# Patient Record
Sex: Female | Born: 1980 | Race: Black or African American | Hispanic: No | Marital: Single | Smoking: Never smoker
Health system: Southern US, Community
[De-identification: ages and names within clinical notes are randomized; demographics above are authoritative.]

## PROBLEM LIST (undated history)

## (undated) DIAGNOSIS — D62 Acute posthemorrhagic anemia: Secondary | ICD-10-CM

## (undated) DIAGNOSIS — F4320 Adjustment disorder, unspecified: Secondary | ICD-10-CM

## (undated) DIAGNOSIS — R7881 Bacteremia: Secondary | ICD-10-CM

## (undated) DIAGNOSIS — N289 Disorder of kidney and ureter, unspecified: Secondary | ICD-10-CM

## (undated) DIAGNOSIS — G8250 Quadriplegia, unspecified: Secondary | ICD-10-CM

## (undated) DIAGNOSIS — K259 Gastric ulcer, unspecified as acute or chronic, without hemorrhage or perforation: Secondary | ICD-10-CM

## (undated) DIAGNOSIS — K269 Duodenal ulcer, unspecified as acute or chronic, without hemorrhage or perforation: Secondary | ICD-10-CM

## (undated) DIAGNOSIS — N12 Tubulo-interstitial nephritis, not specified as acute or chronic: Secondary | ICD-10-CM

## (undated) DIAGNOSIS — L139 Bullous disorder, unspecified: Secondary | ICD-10-CM

## (undated) DIAGNOSIS — N949 Unspecified condition associated with female genital organs and menstrual cycle: Secondary | ICD-10-CM

## (undated) DIAGNOSIS — A419 Sepsis, unspecified organism: Secondary | ICD-10-CM

## (undated) HISTORY — DX: Unspecified condition associated with female genital organs and menstrual cycle: N94.9

## (undated) HISTORY — DX: Tubulo-interstitial nephritis, not specified as acute or chronic: N12

---
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
## 2000-02-03 ENCOUNTER — Emergency Department (HOSPITAL_COMMUNITY): Admission: EM | Admit: 2000-02-03 | Discharge: 2000-02-03 | Payer: Self-pay | Admitting: Emergency Medicine

## 2000-02-04 ENCOUNTER — Encounter: Payer: Self-pay | Admitting: Emergency Medicine

## 2000-05-17 ENCOUNTER — Encounter: Admission: RE | Admit: 2000-05-17 | Discharge: 2000-05-17 | Payer: Self-pay | Admitting: Family Medicine

## 2000-05-26 ENCOUNTER — Encounter: Admission: RE | Admit: 2000-05-26 | Discharge: 2000-05-26 | Payer: Self-pay | Admitting: Family Medicine

## 2000-06-01 ENCOUNTER — Encounter: Admission: RE | Admit: 2000-06-01 | Discharge: 2000-06-01 | Payer: Self-pay | Admitting: Family Medicine

## 2001-05-23 ENCOUNTER — Inpatient Hospital Stay (HOSPITAL_COMMUNITY): Admission: EM | Admit: 2001-05-23 | Discharge: 2001-05-26 | Payer: Self-pay | Admitting: Emergency Medicine

## 2001-05-23 ENCOUNTER — Encounter: Payer: Self-pay | Admitting: *Deleted

## 2001-05-23 ENCOUNTER — Encounter: Payer: Self-pay | Admitting: Emergency Medicine

## 2001-05-24 ENCOUNTER — Encounter: Payer: Self-pay | Admitting: Family Medicine

## 2001-05-25 ENCOUNTER — Encounter: Payer: Self-pay | Admitting: Obstetrics

## 2001-06-07 ENCOUNTER — Inpatient Hospital Stay (HOSPITAL_COMMUNITY): Admission: AD | Admit: 2001-06-07 | Discharge: 2001-06-09 | Payer: Self-pay | Admitting: *Deleted

## 2001-06-07 ENCOUNTER — Encounter (INDEPENDENT_AMBULATORY_CARE_PROVIDER_SITE_OTHER): Payer: Self-pay

## 2001-06-13 ENCOUNTER — Encounter: Admission: RE | Admit: 2001-06-13 | Discharge: 2001-06-13 | Payer: Self-pay | Admitting: Family Medicine

## 2001-08-29 ENCOUNTER — Encounter: Admission: RE | Admit: 2001-08-29 | Discharge: 2001-08-29 | Payer: Self-pay | Admitting: Family Medicine

## 2002-03-09 ENCOUNTER — Encounter: Admission: RE | Admit: 2002-03-09 | Discharge: 2002-03-09 | Payer: Self-pay | Admitting: Family Medicine

## 2002-05-28 ENCOUNTER — Emergency Department (HOSPITAL_COMMUNITY): Admission: EM | Admit: 2002-05-28 | Discharge: 2002-05-28 | Payer: Self-pay | Admitting: Emergency Medicine

## 2002-10-30 ENCOUNTER — Encounter: Admission: RE | Admit: 2002-10-30 | Discharge: 2002-10-30 | Payer: Self-pay | Admitting: Family Medicine

## 2003-01-29 ENCOUNTER — Encounter: Admission: RE | Admit: 2003-01-29 | Discharge: 2003-01-29 | Payer: Self-pay | Admitting: Sports Medicine

## 2003-04-19 ENCOUNTER — Emergency Department (HOSPITAL_COMMUNITY): Admission: EM | Admit: 2003-04-19 | Discharge: 2003-04-20 | Payer: Self-pay | Admitting: Emergency Medicine

## 2004-02-05 ENCOUNTER — Ambulatory Visit: Payer: Self-pay | Admitting: *Deleted

## 2004-02-05 ENCOUNTER — Inpatient Hospital Stay (HOSPITAL_COMMUNITY): Admission: AD | Admit: 2004-02-05 | Discharge: 2004-02-06 | Payer: Self-pay | Admitting: *Deleted

## 2004-03-02 ENCOUNTER — Inpatient Hospital Stay (HOSPITAL_COMMUNITY): Admission: AD | Admit: 2004-03-02 | Discharge: 2004-03-03 | Payer: Self-pay | Admitting: Family Medicine

## 2004-04-24 ENCOUNTER — Inpatient Hospital Stay (HOSPITAL_COMMUNITY): Admission: AD | Admit: 2004-04-24 | Discharge: 2004-04-26 | Payer: Self-pay | Admitting: *Deleted

## 2004-04-24 ENCOUNTER — Ambulatory Visit: Payer: Self-pay | Admitting: Family Medicine

## 2004-04-24 ENCOUNTER — Ambulatory Visit: Payer: Self-pay | Admitting: *Deleted

## 2004-04-24 ENCOUNTER — Encounter (INDEPENDENT_AMBULATORY_CARE_PROVIDER_SITE_OTHER): Payer: Self-pay | Admitting: *Deleted

## 2004-04-25 ENCOUNTER — Encounter (INDEPENDENT_AMBULATORY_CARE_PROVIDER_SITE_OTHER): Payer: Self-pay | Admitting: *Deleted

## 2004-04-25 HISTORY — PX: TUBAL LIGATION: SHX77

## 2004-10-28 ENCOUNTER — Emergency Department (HOSPITAL_COMMUNITY): Admission: EM | Admit: 2004-10-28 | Discharge: 2004-10-28 | Payer: Self-pay | Admitting: Emergency Medicine

## 2004-11-24 ENCOUNTER — Inpatient Hospital Stay (HOSPITAL_COMMUNITY): Admission: AD | Admit: 2004-11-24 | Discharge: 2004-11-24 | Payer: Self-pay | Admitting: Obstetrics & Gynecology

## 2005-10-21 ENCOUNTER — Encounter: Payer: Self-pay | Admitting: Obstetrics

## 2006-01-24 ENCOUNTER — Inpatient Hospital Stay (HOSPITAL_COMMUNITY): Admission: AD | Admit: 2006-01-24 | Discharge: 2006-01-24 | Payer: Self-pay | Admitting: Gynecology

## 2006-05-28 ENCOUNTER — Ambulatory Visit: Payer: Self-pay

## 2006-06-07 ENCOUNTER — Ambulatory Visit: Payer: Self-pay | Admitting: Sports Medicine

## 2006-06-07 ENCOUNTER — Emergency Department (HOSPITAL_COMMUNITY): Admission: EM | Admit: 2006-06-07 | Discharge: 2006-06-07 | Payer: Self-pay | Admitting: Emergency Medicine

## 2006-08-19 DIAGNOSIS — F339 Major depressive disorder, recurrent, unspecified: Secondary | ICD-10-CM | POA: Insufficient documentation

## 2006-08-19 DIAGNOSIS — G8250 Quadriplegia, unspecified: Secondary | ICD-10-CM | POA: Insufficient documentation

## 2006-08-19 DIAGNOSIS — L2089 Other atopic dermatitis: Secondary | ICD-10-CM | POA: Insufficient documentation

## 2006-10-04 ENCOUNTER — Telehealth (INDEPENDENT_AMBULATORY_CARE_PROVIDER_SITE_OTHER): Payer: Self-pay | Admitting: Family Medicine

## 2006-12-01 ENCOUNTER — Telehealth: Payer: Self-pay | Admitting: *Deleted

## 2006-12-02 ENCOUNTER — Encounter (INDEPENDENT_AMBULATORY_CARE_PROVIDER_SITE_OTHER): Payer: Self-pay | Admitting: Family Medicine

## 2006-12-02 ENCOUNTER — Ambulatory Visit: Payer: Self-pay | Admitting: Sports Medicine

## 2006-12-02 ENCOUNTER — Encounter: Payer: Self-pay | Admitting: *Deleted

## 2006-12-02 LAB — CONVERTED CEMR LAB
AST: 12 units/L (ref 0–37)
Albumin: 3.8 g/dL (ref 3.5–5.2)
Calcium: 8.5 mg/dL (ref 8.4–10.5)
Nitrite: POSITIVE
Protein, U semiquant: 100
Specific Gravity, Urine: 1.02
Total Bilirubin: 0.2 mg/dL — ABNORMAL LOW (ref 0.3–1.2)
Total Protein: 7.1 g/dL (ref 6.0–8.3)
Urobilinogen, UA: 0.2
pH: 7

## 2006-12-06 ENCOUNTER — Ambulatory Visit: Payer: Self-pay | Admitting: Family Medicine

## 2006-12-06 ENCOUNTER — Encounter (INDEPENDENT_AMBULATORY_CARE_PROVIDER_SITE_OTHER): Payer: Self-pay | Admitting: Family Medicine

## 2006-12-06 DIAGNOSIS — D509 Iron deficiency anemia, unspecified: Secondary | ICD-10-CM

## 2006-12-06 DIAGNOSIS — D649 Anemia, unspecified: Secondary | ICD-10-CM | POA: Insufficient documentation

## 2006-12-06 LAB — CONVERTED CEMR LAB
Bilirubin Urine: NEGATIVE
Glucose, Urine, Semiquant: NEGATIVE
Ketones, urine, test strip: NEGATIVE
Nitrite: NEGATIVE
Protein, U semiquant: NEGATIVE
Specific Gravity, Urine: 1.02
Urobilinogen, UA: 0.2
pH: 6

## 2006-12-07 LAB — CONVERTED CEMR LAB
Basophils Relative: 0 % (ref 0–1)
Cholesterol: 153 mg/dL (ref 0–200)
Eosinophils Absolute: 0.2 10*3/uL (ref 0.0–0.7)
Eosinophils Relative: 5 % (ref 0–5)
Ferritin: 6 ng/mL — ABNORMAL LOW (ref 10–291)
HDL: 46 mg/dL (ref >39)
Hemoglobin: 9.8 g/dL — ABNORMAL LOW (ref 12.0–15.0)
Iron: 12 ug/dL — ABNORMAL LOW (ref 42–145)
Monocytes Absolute: 0.3 10*3/uL (ref 0.2–0.7)
Neutrophils Relative %: 32 % — ABNORMAL LOW (ref 43–77)
Platelets: 464 10*3/uL — ABNORMAL HIGH (ref 150–400)
RBC: 3.9 M/uL (ref 3.87–5.11)
RDW: 19.9 % — ABNORMAL HIGH (ref 11.5–14.0)
Retic Count, Absolute: 54.6 (ref 19.0–186.0)
Saturation Ratios: 4 % — ABNORMAL LOW (ref 20–55)
Total CHOL/HDL Ratio: 3.3
Triglycerides: 79 mg/dL (ref <150)
UIBC: 306 ug/dL
VLDL: 16 mg/dL (ref 0–40)
WBC: 5.2 10*3/uL (ref 4.0–10.5)

## 2006-12-09 ENCOUNTER — Telehealth: Payer: Self-pay | Admitting: *Deleted

## 2006-12-14 ENCOUNTER — Encounter: Payer: Self-pay | Admitting: Family Medicine

## 2007-01-03 ENCOUNTER — Telehealth: Payer: Self-pay | Admitting: *Deleted

## 2007-01-04 ENCOUNTER — Ambulatory Visit: Payer: Self-pay | Admitting: Family Medicine

## 2007-01-04 DIAGNOSIS — N319 Neuromuscular dysfunction of bladder, unspecified: Secondary | ICD-10-CM | POA: Insufficient documentation

## 2007-01-14 ENCOUNTER — Encounter: Payer: Self-pay | Admitting: Family Medicine

## 2007-01-14 ENCOUNTER — Ambulatory Visit: Payer: Self-pay | Admitting: Family Medicine

## 2007-01-14 ENCOUNTER — Telehealth: Payer: Self-pay | Admitting: Family Medicine

## 2007-01-14 DIAGNOSIS — F329 Major depressive disorder, single episode, unspecified: Secondary | ICD-10-CM

## 2007-01-14 DIAGNOSIS — F3289 Other specified depressive episodes: Secondary | ICD-10-CM | POA: Insufficient documentation

## 2007-01-27 ENCOUNTER — Encounter: Payer: Self-pay | Admitting: Family Medicine

## 2007-03-14 ENCOUNTER — Telehealth: Payer: Self-pay | Admitting: *Deleted

## 2007-03-29 ENCOUNTER — Ambulatory Visit: Payer: Self-pay | Admitting: Family Medicine

## 2007-05-25 ENCOUNTER — Telehealth (INDEPENDENT_AMBULATORY_CARE_PROVIDER_SITE_OTHER): Payer: Self-pay | Admitting: Family Medicine

## 2007-06-01 ENCOUNTER — Encounter (INDEPENDENT_AMBULATORY_CARE_PROVIDER_SITE_OTHER): Payer: Self-pay | Admitting: *Deleted

## 2007-06-24 ENCOUNTER — Telehealth: Payer: Self-pay | Admitting: *Deleted

## 2007-07-15 ENCOUNTER — Telehealth: Payer: Self-pay | Admitting: *Deleted

## 2007-07-18 ENCOUNTER — Ambulatory Visit: Payer: Self-pay | Admitting: Family Medicine

## 2007-09-29 ENCOUNTER — Telehealth: Payer: Self-pay | Admitting: *Deleted

## 2007-10-03 ENCOUNTER — Encounter (INDEPENDENT_AMBULATORY_CARE_PROVIDER_SITE_OTHER): Payer: Self-pay | Admitting: *Deleted

## 2007-10-25 ENCOUNTER — Telehealth: Payer: Self-pay | Admitting: *Deleted

## 2007-12-15 ENCOUNTER — Telehealth: Payer: Self-pay | Admitting: *Deleted

## 2007-12-18 ENCOUNTER — Emergency Department (HOSPITAL_COMMUNITY): Admission: EM | Admit: 2007-12-18 | Discharge: 2007-12-19 | Payer: Self-pay | Admitting: Emergency Medicine

## 2008-03-28 ENCOUNTER — Inpatient Hospital Stay (HOSPITAL_COMMUNITY): Admission: EM | Admit: 2008-03-28 | Discharge: 2008-03-29 | Payer: Self-pay | Admitting: Emergency Medicine

## 2008-03-28 ENCOUNTER — Encounter: Payer: Self-pay | Admitting: Emergency Medicine

## 2008-05-04 ENCOUNTER — Telehealth (INDEPENDENT_AMBULATORY_CARE_PROVIDER_SITE_OTHER): Payer: Self-pay | Admitting: *Deleted

## 2008-06-13 ENCOUNTER — Telehealth: Payer: Self-pay | Admitting: *Deleted

## 2008-07-04 ENCOUNTER — Encounter: Payer: Self-pay | Admitting: Family Medicine

## 2008-08-10 ENCOUNTER — Telehealth: Payer: Self-pay | Admitting: Family Medicine

## 2008-08-14 ENCOUNTER — Ambulatory Visit: Payer: Self-pay | Admitting: Family Medicine

## 2008-08-14 ENCOUNTER — Encounter: Payer: Self-pay | Admitting: Family Medicine

## 2008-09-03 ENCOUNTER — Telehealth: Payer: Self-pay | Admitting: Family Medicine

## 2008-10-08 ENCOUNTER — Telehealth: Payer: Self-pay | Admitting: Family Medicine

## 2008-10-10 ENCOUNTER — Telehealth (INDEPENDENT_AMBULATORY_CARE_PROVIDER_SITE_OTHER): Payer: Self-pay | Admitting: Family Medicine

## 2008-11-14 ENCOUNTER — Telehealth: Payer: Self-pay | Admitting: *Deleted

## 2009-01-31 ENCOUNTER — Encounter: Admission: RE | Admit: 2009-01-31 | Discharge: 2009-03-25 | Payer: Self-pay | Admitting: Family Medicine

## 2009-02-07 ENCOUNTER — Telehealth (INDEPENDENT_AMBULATORY_CARE_PROVIDER_SITE_OTHER): Payer: Self-pay | Admitting: Family Medicine

## 2009-03-02 ENCOUNTER — Encounter (INDEPENDENT_AMBULATORY_CARE_PROVIDER_SITE_OTHER): Payer: Self-pay | Admitting: *Deleted

## 2009-03-02 DIAGNOSIS — F17200 Nicotine dependence, unspecified, uncomplicated: Secondary | ICD-10-CM | POA: Insufficient documentation

## 2009-03-04 ENCOUNTER — Ambulatory Visit: Payer: Self-pay | Admitting: Family Medicine

## 2009-03-05 ENCOUNTER — Telehealth: Payer: Self-pay | Admitting: *Deleted

## 2009-03-06 ENCOUNTER — Encounter: Payer: Self-pay | Admitting: Family Medicine

## 2009-03-06 ENCOUNTER — Encounter: Payer: Self-pay | Admitting: Emergency Medicine

## 2009-03-06 ENCOUNTER — Telehealth: Payer: Self-pay | Admitting: Family Medicine

## 2009-03-07 ENCOUNTER — Ambulatory Visit: Payer: Self-pay | Admitting: Family Medicine

## 2009-03-07 ENCOUNTER — Inpatient Hospital Stay (HOSPITAL_COMMUNITY): Admission: EM | Admit: 2009-03-07 | Discharge: 2009-03-08 | Payer: Self-pay | Admitting: Family Medicine

## 2009-03-12 ENCOUNTER — Encounter: Admission: RE | Admit: 2009-03-12 | Discharge: 2009-06-10 | Payer: Self-pay | Admitting: Family Medicine

## 2009-03-12 ENCOUNTER — Encounter: Payer: Self-pay | Admitting: Family Medicine

## 2009-03-14 ENCOUNTER — Encounter: Payer: Self-pay | Admitting: *Deleted

## 2009-03-18 ENCOUNTER — Encounter: Payer: Self-pay | Admitting: *Deleted

## 2009-04-10 ENCOUNTER — Telehealth: Payer: Self-pay | Admitting: *Deleted

## 2009-04-22 ENCOUNTER — Encounter: Payer: Self-pay | Admitting: Family Medicine

## 2009-04-25 ENCOUNTER — Encounter: Payer: Self-pay | Admitting: Family Medicine

## 2009-04-25 ENCOUNTER — Ambulatory Visit: Payer: Self-pay | Admitting: Family Medicine

## 2009-04-26 ENCOUNTER — Ambulatory Visit: Payer: Self-pay | Admitting: Family Medicine

## 2009-04-26 ENCOUNTER — Encounter: Payer: Self-pay | Admitting: Family Medicine

## 2009-05-02 ENCOUNTER — Telehealth: Payer: Self-pay | Admitting: Family Medicine

## 2009-06-05 ENCOUNTER — Telehealth: Payer: Self-pay | Admitting: *Deleted

## 2009-07-01 ENCOUNTER — Telehealth: Payer: Self-pay | Admitting: Family Medicine

## 2009-07-04 ENCOUNTER — Telehealth: Payer: Self-pay | Admitting: Family Medicine

## 2009-10-08 ENCOUNTER — Telehealth: Payer: Self-pay | Admitting: Family Medicine

## 2009-11-11 ENCOUNTER — Encounter (HOSPITAL_BASED_OUTPATIENT_CLINIC_OR_DEPARTMENT_OTHER): Admission: RE | Admit: 2009-11-11 | Discharge: 2010-02-09 | Payer: Self-pay | Admitting: General Surgery

## 2009-11-22 ENCOUNTER — Telehealth: Payer: Self-pay | Admitting: *Deleted

## 2009-12-02 ENCOUNTER — Ambulatory Visit: Payer: Self-pay | Admitting: Family Medicine

## 2009-12-12 ENCOUNTER — Telehealth: Payer: Self-pay | Admitting: Family Medicine

## 2009-12-13 ENCOUNTER — Telehealth (INDEPENDENT_AMBULATORY_CARE_PROVIDER_SITE_OTHER): Payer: Self-pay | Admitting: Family Medicine

## 2009-12-27 ENCOUNTER — Telehealth: Payer: Self-pay | Admitting: Family Medicine

## 2010-01-10 ENCOUNTER — Ambulatory Visit: Payer: Self-pay | Admitting: Family Medicine

## 2010-01-10 ENCOUNTER — Encounter: Payer: Self-pay | Admitting: Family Medicine

## 2010-01-10 DIAGNOSIS — F411 Generalized anxiety disorder: Secondary | ICD-10-CM | POA: Insufficient documentation

## 2010-01-10 LAB — CONVERTED CEMR LAB
Basophils Absolute: 0 10*3/uL (ref 0.0–0.1)
Basophils Relative: 0 % (ref 0–1)
Eosinophils Absolute: 0.1 10*3/uL (ref 0.0–0.7)
Eosinophils Relative: 1 % (ref 0–5)
HCT: 30.4 % — ABNORMAL LOW (ref 36.0–46.0)
Lymphocytes Relative: 20 % (ref 12–46)
MCHC: 33 g/dL (ref 30.0–36.0)
Monocytes Relative: 7 % (ref 3–12)
Nitrite: POSITIVE
RBC: 3.77 M/uL — ABNORMAL LOW (ref 3.87–5.11)
Specific Gravity, Urine: 1.015
WBC: 8 10*3/uL (ref 4.0–10.5)
pH: 6

## 2010-01-11 ENCOUNTER — Encounter: Payer: Self-pay | Admitting: Family Medicine

## 2010-01-11 LAB — CONVERTED CEMR LAB: TSH: 1.099 u[IU]/mL

## 2010-01-28 ENCOUNTER — Telehealth: Payer: Self-pay | Admitting: Family Medicine

## 2010-02-02 ENCOUNTER — Encounter: Payer: Self-pay | Admitting: *Deleted

## 2010-02-04 ENCOUNTER — Ambulatory Visit: Payer: Self-pay | Admitting: Family Medicine

## 2010-02-18 ENCOUNTER — Telehealth: Payer: Self-pay | Admitting: Family Medicine

## 2010-03-04 ENCOUNTER — Telehealth: Payer: Self-pay | Admitting: *Deleted

## 2010-04-25 ENCOUNTER — Encounter: Payer: Self-pay | Admitting: Family Medicine

## 2010-07-24 NOTE — Assessment & Plan Note (Signed)
Summary: anxiety/saunders pt/eo   Vital Signs:  Patient profile:   30 year old female Temp:     101.4 degrees F oral Pulse rate:   106 / minute BP sitting:   111 / 69  (left arm) Cuff size:   regular  Vitals Entered By: Garen Grams LPN (January 10, 2010 2:36 PM) CC: ? frequent anxiety attacks Is Patient Diabetic? No Pain Assessment Patient in pain? yes     Location: body aches   CC:  ? frequent anxiety attacks.  History of Present Illness: 70 wheelchair bound female, here with boyfriend:  1) Anxiety attacks: "Anxiety attacks" x past year. Feels extremely anxious about being around people, being on highway, finances - "anything stressful thing can set [her] off". She then develops chest tighness, dyspnea, nausea w/ occasional vomiting. Denies prior history of depression (treated or otherwise) though this is listed as one of her diagnoses - she states that she was not aware of this. Reports poor sleep due to being nervous; denies decreased energy, anhedonia, suicidal / homicidal ideation, guilt, symptoms of mania,   2) Fever, myalgia: Reports fever (subjective), myalgia, and headache since last night. Keeping down liquids, but has decreased appetite. Reports mild dry cough, but always feels like she has cough, also reports mild sore throat. History of multiple episodes of pyelonephritis - does not self-cath - just wears Depends. Denies (except as above with anxiety): chest pain, dyspnea, vomiting, nausea, diarrhea, lethargy, rash, ulceration.   see prior meds for med rec   Habits & Providers  Alcohol-Tobacco-Diet     Tobacco Status: never  Medications Prior to Update: 1)  Ortho-Cyclen (28) 0.25-35 Mg-Mcg  Tabs (Norgestimate-Eth Estradiol) .Marland Kitchen.. 1 Tab By Mouth Daily As Directed- Dispense 1 Pill Pack 2)  Ventolin Hfa 108 (90 Base) Mcg/act Aers (Albuterol Sulfate) .... 2 Puffs Every 6 Hours As Needed For Shortness of Breath 3)  Klonopin 0.5 Mg Tabs (Clonazepam) .... Take 1 Tab Twice A  Day As Needed For Muscle Spasm  Current Medications (verified): 1)  Ortho-Cyclen (28) 0.25-35 Mg-Mcg  Tabs (Norgestimate-Eth Estradiol) .Marland Kitchen.. 1 Tab By Mouth Daily As Directed- Dispense 1 Pill Pack 2)  Ventolin Hfa 108 (90 Base) Mcg/act Aers (Albuterol Sulfate) .... 2 Puffs Every 6 Hours As Needed For Shortness of Breath 3)  Clonazepam 1 Mg Tbdp (Clonazepam) .... One Tab By Mouth Two Times A Day As Needed Anxiety, Muscle Spasm 4)  Fluoxetine Hcl 20 Mg Caps (Fluoxetine Hcl) .... One Tab By Mouth Qam  Allergies (verified): No Known Drug Allergies  Physical Exam  General:  alert and overweight appearing; in wheelchair; does not appear to be feeling well vitals reviewed  Nose:  no congestion  Mouth:  moist membranes, no erythema or exudate  Neck:  no lymphadenopathy   Lungs:  work of breathing unlabored, clear to auscultation bilaterally; no wheezes, rales, or ronchi; good air movement throughout  Heart:  tachy, reg rate, no murmur.   Abdomen:  soft, normal bowel sounds, no guarding, and no rebound  exquisitely tender left CVA, mild right CVA tender  Msk:  R arm:  Full ROM at shoulder, elbow, wrist.  5/5 strength in proximal muscles but no grip strength.  L arm:  Full ROM at shoulder, elbow, wrist.  5/5 strength in proximal muscles but no grip strength.  Lower extremities:  no movement or sensation Pulses:  2+ radials tachy  Neurologic:  alert & oriented X3.     Impression & Recommendations:  Problem # 1:  PYELONEPHRITIS, ACUTE (ICD-590.10) Assessment New  Given fever, CVA tenderness, prior history, positive urinalysis - concern for pyelonephritis. Strongly advised patient to be admitted to hospital - she refuses admission at this time because she "does not want to miss school" and "they would keep [her] too long in the hospital". Patient with capacity to make medical decisions. Discussed serious consequences of pyelonephritis including death - she expressed understanding but did not  wish to be admitted. Able to keep down fluids. Advised to call triage line or go to ER if even slightly worse than current condition - she agrees to do so. Strict red flags and parameters discussed.   Check STAT CBC w/ diff today - result pending  UA as below. Urine Cx pending.  Will treat today with ceftriaxone 1 gram IM x 1, ciprofloxacin as below, cephalexin as below. Follow up on Monday regardless of condition.   Orders: [facility]- Est  Level 4 ([address])  Problem # 2:  ANXIETY DISORDER (ICD-300.00) Assessment: New  Likely given symptomatology. Will start fluoxetine, will increase clonazepam to 1 mg two times a day (was on this for muscle spasm). Follow up with PCP on Monday, would arrange follow up for 2 weeks after that. Able to contract for safety. Will check TSH   Her updated medication list for this problem includes:    Clonazepam 1 Mg Tbdp (Clonazepam) ..... One tab by mouth two times a day as needed anxiety, muscle spasm    Fluoxetine Hcl 20 Mg Caps (Fluoxetine hcl) ..... One tab by mouth qam  Orders: [facility]- Est  Level 4 (99214)  Complete Medication List: 1)  Ortho-cyclen (28) 0.25-35 Mg-mcg Tabs (Norgestimate-eth estradiol) .Marland Kitchen.. 1 tab by mouth daily as directed- dispense 1 pill pack 2)  Ventolin Hfa 108 (90 Base) Mcg/act Aers (Albuterol sulfate) .... 2 puffs every 6 hours as needed for shortness of breath 3)  Clonazepam 1 Mg Tbdp (Clonazepam) .... One tab by mouth two times a day as needed anxiety, muscle spasm 4)  Fluoxetine Hcl 20 Mg Caps (Fluoxetine hcl) .... One tab by mouth qam 5)  Keflex 500 Mg Caps (Cephalexin) .... One tab by mouth two times a day x 14 days 6)  Ciprofloxacin Hcl 500 Mg Tabs (Ciprofloxacin hcl) .... One tab by mouth two times a day x 14 days  Other Orders: Urinalysis-[facility] (00000) CBC w/Diff-[facility] (60454) TSH-[facility] (09811-91478) Urine Culture-[facility] ([id number]) [phone])  Patient Instructions: 1)  I still think you should come  into the hospital to be treated for your likely kidney infection 2)  Take tylenol for fever or pain 3)  If for ANY reason you feel worse, if your fever is not improving, please come in to the ER to be seen  4)  Come back in on Monday to be seen by Dr. Lelon Perla 5)  Make sure you get enough to drink. Prescriptions: FLUOXETINE HCL 20 MG CAPS (FLUOXETINE HCL) one tab by mouth qAM  #30 x 0   Entered and Authorized by:   Bobby Rumpf  MD   Signed by:   Bobby Rumpf  MD on 01/10/2010   Method used:   Print then Give to Patient   RxID:   [id number] CLONAZEPAM 1 MG TBDP (CLONAZEPAM) one tab by mouth two times a day as needed anxiety, muscle spasm  #60 x 0   Entered and Authorized by:   Bobby Rumpf  MD   Signed by:   Bobby Rumpf  MD on 01/10/2010  Method used:   Print then Give to Patient   RxID:   [id number] CIPROFLOXACIN HCL 500 MG TABS (CIPROFLOXACIN HCL) one tab by mouth two times a day x 14 days  #28 x 0   Entered and Authorized by:   Bobby Rumpf  MD   Signed by:   Bobby Rumpf  MD on 01/10/2010   Method used:   Print then Give to Patient   RxID:   [id number] KEFLEX 500 MG CAPS (CEPHALEXIN) one tab by mouth two times a day x 14 days  #28 x 0   Entered and Authorized by:   Bobby Rumpf  MD   Signed by:   Bobby Rumpf  MD on 01/10/2010   Method used:   Print then Give to Patient   RxID:   [id number] CLONAZEPAM 1 MG TBDP (CLONAZEPAM) one tab by mouth two times a day as needed anxiety, muscle spasm  #60 x 0   Entered and Authorized by:   Bobby Rumpf  MD   Signed by:   Bobby Rumpf  MD on 01/10/2010   Method used:   Print then Give to Patient   RxID:   [id number] FLUOXETINE HCL 20 MG CAPS (FLUOXETINE HCL) one tab by mouth qAM  #30 x 0   Entered and Authorized by:   Bobby Rumpf  MD   Signed by:   Bobby Rumpf  MD on 01/10/2010   Method used:   [facility]        [facility] [address] [phone]* (retail)       [address]       [place], [place]  [address]       Ph: [phone] or [phone]       Fax: [phone]   RxID:   [id number]   Laboratory Results   Urine Tests  Date/Time Received: January 10, 2010 3:14 PM  Date/Time Reported: January 10, 2010 3:41 PM   Routine Urinalysis   Color: yellow Appearance: Cloudy Glucose: negative   (Normal Range: Negative) Bilirubin: negative   (Normal Range: Negative) Ketone: trace (5)   (Normal Range: Negative) Spec. Gravity: 1.015   (Normal Range: 1.003-1.035) Blood: moderate   (Normal Range: Negative) pH: 6.0   (Normal Range: 5.0-8.0) Protein: trace   (Normal Range: Negative) Urobilinogen: 1.0   (Normal Range: 0-1) Nitrite: positive   (Normal Range: Negative) Leukocyte Esterace: large   (Normal Range: Negative)  Urine Microscopic WBC/HPF: TNTC RBC/HPF: 0-3 Bacteria/HPF: 3+ Epithelial/HPF: 5-10    Comments: cath urine;  urine sent for culture ...............test performed by......Marland KitchenBonnie A. [place], MLS (ASCP)cm       Appended Document: anxiety/saunders pt/eo   Medication Administration  Injection # 1:    Medication: Rocephin  250mg     Diagnosis: PYELONEPHRITIS, ACUTE (ICD-590.10)    Route: IM    Site: LUOQ gluteus    Exp Date: 01/21/2011    Lot #: [id number]    Mfr: [facility]    Comments: Patient recieved 1 gram of Ceftriaxone    Patient tolerated injection without complications    Given by: Garen Grams LPN (January 10, 2010 4:46 PM)  Orders Added: 1)  Rocephin  250mg  [E8315]

## 2010-07-24 NOTE — Progress Notes (Signed)
Summary: phn msg  Phone Note Call from Patient Call back at Home Phone [phone]   Summary of Call: Wanting to talk Dr. Lelon Perla about some papers she needs filling out for home health care. Initial call taken by: Clydell Hakim,  November 22, 2009 2:57 PM  Follow-up for Phone Call        Pt has appt on the 13th.  Will bring in then.  **** pt is always very hostile when asked to make appt for paperwork and often no shows.  Pt sts " I dont understand how my condition will change and why the other MDs cant fill out paperwork"  Advised that other MDs saw her as WIs and her PCP who would need to fill out the form.  Pt again argues and promptly hangs up the phone.  May also reference phone note from 5.26.10.  Will forward to MD and Dennison Nancy******* Follow-up by: Jone Baseman CMA,  November 25, 2009 2:59 PM

## 2010-07-24 NOTE — Progress Notes (Signed)
Summary: triage  Phone Note Call from Patient Call back at [phone]   Caller: Patient Summary of Call: can't breath and needs to know waht she can do.  has tried several things and needs to talk to nurse Initial call taken by: De Nurse,  July 01, 2009 9:22 AM  Follow-up for Phone Call        states she has been sick & having trouble breathing x 1 month. getting progressively worse. inhaler is not helping at all. states she cannot breathe. her speech was labored & in short pharses. audible breath sounds over phone. irregular. she sounded panicky when talking to me. advised ED via EMS now. she had wanted appt but could not get here this am. told her she needed prompt treatment & ED was best. she agreed to call 911 now  Follow-up by: Golden Circle RN,  July 01, 2009 9:28 AM

## 2010-07-24 NOTE — Progress Notes (Signed)
Summary: triage  Phone Note Call from Patient Call back at Home Phone [phone]   Caller: Patient Summary of Call: Pt extremely stressed out sounding tearful. Initial call taken by: Clydell Hakim,  December 12, 2009 1:39 PM  Follow-up for Phone Call        states she is VERY stressed with the kids & being in school & having a report to write, etc. tearful. taking the klonipin for her muscles. states it is not helping her nerves & wants something today. PCP is not here. told her likely she will need to be seen. no appt left for today. she asked that I see if another md will rx something. told her I will ask & call her back Follow-up by: Golden Circle RN,  December 12, 2009 1:59 PM  Additional Follow-up for Phone Call Additional follow up Details #1::        spoke with Dr. Deirdre Priest. must be seen . no meds over the phone.  told pt & she was displeased. I offered an appt. she saID " don't worry about it" and then hung up on me Additional Follow-up by: Golden Circle RN,  December 12, 2009 2:07 PM

## 2010-07-24 NOTE — Progress Notes (Signed)
Summary: phnmsg  Phone Note Call from Patient Call back at Home Phone [phone]   Caller: Patient Summary of Call: is wanting to talk to nurse about forms that she wanted filled out by doctor. Initial call taken by: De Nurse,  October 08, 2009 2:49 PM  Follow-up for Phone Call        has pains "all over" difficult to void. pain x 2 days. she will be here by 3:30 to be seen Follow-up by: Golden Circle RN,  October 08, 2009 3:12 PM

## 2010-07-24 NOTE — Progress Notes (Signed)
Summary: Triage  Phone Note Refill Request Call back at Home Phone [phone] Message from:  Patient  Refills Requested: Medication #1:  CLONAZEPAM 1 MG TBDP one tab by mouth two times a day as needed anxiety Tracey Hall.  PT IS OUT AND IS VERY STRESSED OUT.  Initial call taken by: Clydell Hakim,  January 28, 2010 3:52 PM  Follow-up for Phone Call        to pcp Follow-up by: Golden Circle RN,  January 28, 2010 3:54 PM  Additional Follow-up for Phone Call Additional follow up Details #1::        states she is very stressed & somebody has to fill it. she is totally out. told her I will check with another md & call her back Additional Follow-up by: Golden Circle RN,  January 28, 2010 4:45 PM    Additional Follow-up for Phone Call Additional follow up Details #2::    Had an Rx for sixty on 7/22 so should not be out.   Also had multiple No Shows We can not refill it without an office visit with her PCP Follow-up by: Pearlean Brownie MD,  January 28, 2010 5:00 PM  Additional Follow-up for Phone Call Additional follow up Details #3:: Details for Additional Follow-up Action Taken: LM asking her to call & make appt for tomorrow with her pcp Additional Follow-up by: Golden Circle RN,  January 29, 2010 8:44 AM

## 2010-07-24 NOTE — Progress Notes (Signed)
Summary: phn msg  Phone Note Call from Patient Call back at [phone]   Caller: Patient Summary of Call: checking on status of paperwork for wheelchair. Initial call taken by: Clydell Hakim,  July 04, 2009 3:22 PM  Follow-up for Phone Call        papers ready to be faxed.  Please scan paperwork Follow-up by: Angelena Sole MD,  July 12, 2009 2:10 PM

## 2010-07-24 NOTE — Miscellaneous (Signed)
  Clinical Lists Changes  Problems: Removed problem of FEVER UNSPECIFIED (ICD-780.60) Removed problem of PYELONEPHRITIS, ACUTE (ICD-590.10) Removed problem of NAUSEA (ICD-787.02) Removed problem of DYSPNEA (ICD-786.05) Removed problem of DISTURBANCE OF SKIN SENSATION (ICD-782.0) Removed problem of BLISTERS (ICD-919.2) Removed problem of IMPETIGO (ICD-684) Removed problem of WEIGHT GAIN (ICD-783.1) Removed problem of PYELONEPHRITIS, ACUTE (ICD-590.10) Removed problem of BACK PAIN (ICD-724.5) Removed problem of VISUAL DISTURBANCE NOS (ICD-368.9) Removed problem of LEG PAIN OR KNEE PAIN (ICD-729.5) Removed problem of HYPERHIDROSIS (ICD-780.8)

## 2010-07-24 NOTE — Progress Notes (Signed)
Summary: Equipment Req  Phone Note Call from Patient Call back at [facility] Phone [phone]   Caller: Patient Summary of Call: Needs rx for a shower chair with arms and seatbelt on it.  [facility] is where iit needs to be faxed to [phone]. Initial call taken by: Clydell Hakim,  December 13, 2009 1:55 PM  Follow-up for Phone Call        Rx placed in To-Be-Faxed box Follow-up by: Angelena Sole MD,  December 18, 2009 10:10 AM

## 2010-07-24 NOTE — Progress Notes (Signed)
Summary: pls call  Phone Note Call from Patient Call back at Home Phone [phone]   Caller: Patient Summary of Call: pt wants to talk to Dr Lelon Perla - would not say why Initial call taken by: De Nurse,  December 27, 2009 3:16 PM  Follow-up for Phone Call        Attempted to call patient.  No answer. Follow-up by: Angelena Sole MD,  December 28, 2009 12:40 PM  Additional Follow-up for Phone Call Additional follow up Details #1::        Attempted to call patient again.  No answer. Additional Follow-up by: Angelena Sole MD,  December 30, 2009 9:17 AM    Additional Follow-up for Phone Call Additional follow up Details #2::    Spoke with patient about some stuff that is going on in her life and she just needed someone to talk to.  She is having issues with OCD, Anxiety, Anger, and Depression.  She denies any SI.  She feels like there is a lot on her plate and she is getting overwhelmed.  I asked her if we could come see her on Thursday but she will not be back from class until 12:30.  Advised her to schedule an appointment with me sometime this week to discuss.  She voiced her agreement. Follow-up by: Angelena Sole MD,  December 30, 2009 2:22 PM   Appended Document: pls call pt has to be seen in the afternoon due to school schedule, scheduled with Wallene Huh 01/10/10 at 2:30. Pt wanted MD to be aware.
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
## 2010-07-24 NOTE — Progress Notes (Signed)
Summary: phn msg  Phone Note Call from Patient Call back at [facility] Phone [phone]   Caller: Patient Summary of Call: Says that [facility] has not recieved the rx for the shower chair.  It should be faxed to [phone]. Initial call taken by: Clydell Hakim,  March 04, 2010 1:42 PM  Follow-up for Phone Call        Previous phone note faxed to number provided.  Follow-up by: Garen Grams LPN,  March 04, 2010 2:06 PM

## 2010-07-24 NOTE — Progress Notes (Signed)
  Phone Note Call from Patient   Caller: Patient Call For: Angelena Sole MD Summary of Call: Requesting a new order faxed to [facility] for a shower chair.  They need this faxed ASAP.  If there are any qestions regarding this please call [phone] or call [facility] at [phone].   You can put to the attention of:  Marja Kays and Lenetta Quaker. Initial call taken by: Abundio Miu  Follow-up for Phone Call        [facility] for a new shower chair Follow-up by: Angelena Sole MD,  February 19, 2010 9:07 AM

## 2010-07-24 NOTE — Letter (Signed)
Summary: Probation Letter  [facility]  [address]   [place], [place] [address]   Phone: [phone]  Fax: [phone]    02/02/2010  AIYA KEACH [address] [place], [place]  [address]  Dear Ms. Beske,  With the goal of better serving all our patients the [facility] is following each patient's missed appointments.  You have missed at least 3 appointments with our practice.If you cannot keep your appointment, we expect you to call at least 24 hours before your appointment time.  Missing appointments prevents other patients from seeing [place] and makes it difficult to provide you with the best possible medical care.      1.   If you miss one more appointment, we will only give you limited medical services. This means we will not call in medication refills, complete a form, or make a referral for you except when you are here for a scheduled office visit.    2.   If you miss 2 or more appointments in the next year, we will dismiss you from our practice.    Our office staff can be reached at [phone] Monday through Friday from 8:30 a.m.-5:00 p.m. and will be glad to schedule your appointment as necessary.    Thank you.   [facility]  Appended Document: Probation Letter mailed

## 2010-08-21 ENCOUNTER — Inpatient Hospital Stay (HOSPITAL_COMMUNITY): Payer: Medicare Other

## 2010-08-21 ENCOUNTER — Ambulatory Visit (INDEPENDENT_AMBULATORY_CARE_PROVIDER_SITE_OTHER): Payer: Self-pay | Admitting: Family Medicine

## 2010-08-21 ENCOUNTER — Encounter (HOSPITAL_COMMUNITY): Payer: Self-pay | Admitting: Radiology

## 2010-08-21 ENCOUNTER — Encounter: Payer: Self-pay | Admitting: Family Medicine

## 2010-08-21 ENCOUNTER — Inpatient Hospital Stay (HOSPITAL_COMMUNITY)
Admission: AD | Admit: 2010-08-21 | Discharge: 2010-08-24 | DRG: 689 | Disposition: A | Discharge status: Home / Self Care | Payer: Medicare Other | Source: Ambulatory Visit | Attending: Family Medicine | Admitting: Family Medicine

## 2010-08-21 DIAGNOSIS — G8250 Quadriplegia, unspecified: Secondary | ICD-10-CM | POA: Diagnosis present

## 2010-08-21 DIAGNOSIS — N390 Urinary tract infection, site not specified: Principal | ICD-10-CM | POA: Diagnosis present

## 2010-08-21 DIAGNOSIS — D509 Iron deficiency anemia, unspecified: Secondary | ICD-10-CM | POA: Diagnosis present

## 2010-08-21 DIAGNOSIS — F411 Generalized anxiety disorder: Secondary | ICD-10-CM | POA: Diagnosis present

## 2010-08-21 DIAGNOSIS — R109 Unspecified abdominal pain: Secondary | ICD-10-CM | POA: Insufficient documentation

## 2010-08-21 DIAGNOSIS — R509 Fever, unspecified: Secondary | ICD-10-CM | POA: Insufficient documentation

## 2010-08-21 DIAGNOSIS — A498 Other bacterial infections of unspecified site: Secondary | ICD-10-CM | POA: Diagnosis present

## 2010-08-21 DIAGNOSIS — E278 Other specified disorders of adrenal gland: Secondary | ICD-10-CM | POA: Diagnosis present

## 2010-08-21 DIAGNOSIS — E876 Hypokalemia: Secondary | ICD-10-CM | POA: Diagnosis present

## 2010-08-21 HISTORY — DX: Disorder of kidney and ureter, unspecified: N28.9

## 2010-08-21 LAB — GRAM STAIN

## 2010-08-21 LAB — URINALYSIS, ROUTINE W REFLEX MICROSCOPIC
Bilirubin Urine: NEGATIVE
Specific Gravity, Urine: 1.013 (ref 1.005–1.030)
Urine Glucose, Fasting: NEGATIVE mg/dL
Urobilinogen, UA: 1 mg/dL (ref 0.0–1.0)

## 2010-08-21 LAB — CBC
HCT: 30.4 % — ABNORMAL LOW (ref 36.0–46.0)
Hemoglobin: 10 g/dL — ABNORMAL LOW (ref 12.0–15.0)
MCV: 73.8 fL — ABNORMAL LOW (ref 78.0–100.0)
Platelets: 311 10*3/uL (ref 150–400)
RBC: 4.12 MIL/uL (ref 3.87–5.11)
WBC: 9.1 10*3/uL (ref 4.0–10.5)

## 2010-08-21 LAB — COMPREHENSIVE METABOLIC PANEL
ALT: 12 U/L (ref 0–35)
AST: 18 U/L (ref 0–37)
Albumin: 3.1 g/dL — ABNORMAL LOW (ref 3.5–5.2)
BUN: 9 mg/dL (ref 6–23)
Calcium: 8.7 mg/dL (ref 8.4–10.5)
GFR calc non Af Amer: >60 mL/min (ref >60)
Potassium: 3.7 mEq/L (ref 3.5–5.1)
Total Protein: 6.9 g/dL (ref 6.0–8.3)

## 2010-08-21 LAB — URINE MICROSCOPIC-ADD ON

## 2010-08-21 LAB — LIPASE, BLOOD: Lipase: 20 U/L (ref 11–59)

## 2010-08-21 MED ORDER — IOHEXOL 300 MG/ML  SOLN
95.0000 mL | Freq: Once | INTRAMUSCULAR | Status: AC | PRN
  Administered 2010-08-21: 95 mL via INTRAVENOUS
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
## 2010-08-21 NOTE — Progress Notes (Signed)
  Subjective:    Patient ID: Tracey Hall, female    DOB: 1980/08/04, 30 y.o.   MRN: [id number]  HPI Comments: Pt with a hx of bacteremia.  This feels similar to that.  Fever  This is a new problem. The current episode started yesterday. The problem occurs constantly. The problem has been gradually worsening. The maximum temperature noted was more than 104 F. The temperature was taken using an axillary reading. Associated symptoms include abdominal pain, diarrhea and nausea. Pertinent negatives include no chest pain, congestion, coughing, headaches, muscle aches, rash, sore throat, urinary pain or vomiting. She has tried acetaminophen for the symptoms. The treatment provided no relief.      Review of Systems  Constitutional: Positive for fever.  HENT: Negative for congestion and sore throat.   Respiratory: Negative for cough.   Cardiovascular: Negative for chest pain.  Gastrointestinal: Positive for nausea, abdominal pain and diarrhea. Negative for vomiting.  Genitourinary: Negative for dysuria.  Musculoskeletal: Negative for joint swelling and arthralgias.  Skin: Negative for rash.  Neurological: Negative for headaches.  Psychiatric/Behavioral: The patient is nervous/anxious.        Objective:   Physical Exam  Constitutional: She is oriented to person, place, and time.       quadriplegic  Eyes: Conjunctivae are normal. Pupils are equal, round, and reactive to light.  Neck: Normal range of motion. Neck supple.  Cardiovascular: Exam reveals no friction rub.   No murmur heard.      Tachycardic   Pulmonary/Chest: Effort normal and breath sounds normal. No respiratory distress. She has no wheezes. She has no rales.  Abdominal: Soft. Bowel sounds are normal. She exhibits no distension and no mass. There is tenderness. There is guarding. There is no rebound.  Neurological: She is alert and oriented to person, place, and time.  Skin: Skin is warm and dry. No rash noted. No erythema.         No ulcers easily identified  Psychiatric:       Appears anxious          Assessment & Plan:

## 2010-08-21 NOTE — Assessment & Plan Note (Signed)
Abdominal pain in the settting of a high fever is concerning for diverticulitis vs colitis.  Will order CT abdomen to evaluate.  Control pain with morphine.

## 2010-08-21 NOTE — Assessment & Plan Note (Addendum)
Pt with a hx of quadraplegia and bacteremia.  Will admit to the hospital to rule out sepsis.  Obtain CBC, CMET, UA, Urine Cx, Blood Cx, Procalcitonin.  Will start Vanc / Zosyn.

## 2010-08-22 DIAGNOSIS — N10 Acute tubulo-interstitial nephritis: Secondary | ICD-10-CM

## 2010-08-22 LAB — BASIC METABOLIC PANEL
BUN: 5 mg/dL — ABNORMAL LOW (ref 6–23)
Chloride: 105 mEq/L (ref 96–112)
Creatinine, Ser: 0.62 mg/dL (ref 0.4–1.2)
GFR calc Af Amer: >60 mL/min (ref >60)
GFR calc non Af Amer: >60 mL/min (ref >60)

## 2010-08-22 LAB — CBC
MCH: 24.9 pg — ABNORMAL LOW (ref 26.0–34.0)
MCV: 73.6 fL — ABNORMAL LOW (ref 78.0–100.0)
Platelets: 240 10*3/uL (ref 150–400)
RBC: 3.29 MIL/uL — ABNORMAL LOW (ref 3.87–5.11)
RDW: 18.7 % — ABNORMAL HIGH (ref 11.5–15.5)
WBC: 6.8 10*3/uL (ref 4.0–10.5)

## 2010-08-23 LAB — BASIC METABOLIC PANEL
Chloride: 110 mEq/L (ref 96–112)
GFR calc Af Amer: >60 mL/min (ref >60)
GFR calc non Af Amer: >60 mL/min (ref >60)
Potassium: 3.6 mEq/L (ref 3.5–5.1)
Sodium: 140 mEq/L (ref 135–145)

## 2010-08-23 LAB — CBC
Platelets: 262 10*3/uL (ref 150–400)
RBC: 3.56 MIL/uL — ABNORMAL LOW (ref 3.87–5.11)
RDW: 19.3 % — ABNORMAL HIGH (ref 11.5–15.5)
WBC: 6 10*3/uL (ref 4.0–10.5)

## 2010-08-25 LAB — URINE CULTURE
Colony Count: >=100000
Culture  Setup Time: 201203011828

## 2010-08-28 LAB — CULTURE, BLOOD (ROUTINE X 2)
Culture  Setup Time: 201203020130
Culture  Setup Time: 201203020130
Culture: NO GROWTH

## 2010-09-15 ENCOUNTER — Ambulatory Visit (INDEPENDENT_AMBULATORY_CARE_PROVIDER_SITE_OTHER): Payer: Medicare Other | Admitting: Family Medicine

## 2010-09-15 ENCOUNTER — Encounter: Payer: Self-pay | Admitting: Family Medicine

## 2010-09-15 ENCOUNTER — Other Ambulatory Visit: Payer: Self-pay | Admitting: Family Medicine

## 2010-09-15 DIAGNOSIS — N949 Unspecified condition associated with female genital organs and menstrual cycle: Secondary | ICD-10-CM

## 2010-09-15 DIAGNOSIS — Z87440 Personal history of urinary (tract) infections: Secondary | ICD-10-CM

## 2010-09-15 DIAGNOSIS — N9489 Other specified conditions associated with female genital organs and menstrual cycle: Secondary | ICD-10-CM

## 2010-09-15 DIAGNOSIS — J45909 Unspecified asthma, uncomplicated: Secondary | ICD-10-CM

## 2010-09-15 DIAGNOSIS — N390 Urinary tract infection, site not specified: Secondary | ICD-10-CM | POA: Insufficient documentation

## 2010-09-15 DIAGNOSIS — M62838 Other muscle spasm: Secondary | ICD-10-CM

## 2010-09-15 HISTORY — DX: Unspecified condition associated with female genital organs and menstrual cycle: N94.9

## 2010-09-15 MED ORDER — SULFAMETHOXAZOLE-TRIMETHOPRIM 400-80 MG PO TABS: 1.0000 | ORAL_TABLET | Freq: Every day | ORAL | Status: AC

## 2010-09-15 MED ORDER — CYCLOBENZAPRINE HCL 5 MG PO TABS: 5.0000 mg | ORAL_TABLET | Freq: Two times a day (BID) | ORAL | Status: AC | PRN

## 2010-09-15 MED ORDER — FLUTICASONE-SALMETEROL 100-50 MCG/DOSE IN AEPB: 1.0000 | INHALATION_SPRAY | Freq: Two times a day (BID) | RESPIRATORY_TRACT | Status: DC

## 2010-09-15 NOTE — Assessment & Plan Note (Signed)
Cough could be related to asthma although Albuterol doesn't help.  Will try and add Advair to see if that help.  Her lungs sound clear today.

## 2010-09-15 NOTE — Patient Instructions (Signed)
We will start you on a daily antibiotic to try and prevent the UTI's from coming back We will try Flexeril for your muscle spasms and Advair for the cough Please schedule a follow up appointment with me in 1 month

## 2010-09-15 NOTE — Progress Notes (Signed)
  Subjective:    Patient ID: Tracey Hall, female    DOB: 1980-10-06, 29 y.o.   MRN: [id number]  HPI 1. Hx of recurrent UTI's:  She has had multiple UTI's in the past year.  She has been hospitalized at least twice and has had bacteremia.  She has quadraplegia and has to self-cath  2. Muscle spasm:  She has tried Baclofen but that doesn't help.  Would like to try something else.  3. Cough:  She has been using the Albuterol inhaler but that doesn't really seem to help.  She would like to try a different inhaler  4. Complex adnexal mass: This was found on CT scan of abd/pelvis while she was in the hospital   Review of Systems  Constitutional: Negative for fever, chills, diaphoresis, fatigue and unexpected weight change.  Respiratory: Positive for cough and wheezing. Negative for chest tightness, shortness of breath and stridor.   Cardiovascular: Negative for chest pain and leg swelling.  Gastrointestinal: Negative for abdominal distention.  Genitourinary: Negative for dysuria, frequency, hematuria, flank pain and difficulty urinating.  Neurological: Negative for dizziness and weakness.  Hematological: Negative for adenopathy.  Psychiatric/Behavioral: Negative for agitation.       Objective:   Physical Exam  Constitutional: She is oriented to person, place, and time.       quadriplegic  Eyes: Conjunctivae are normal. Pupils are equal, round, and reactive to light.  Neck: Normal range of motion. Neck supple.  Cardiovascular: Normal rate and regular rhythm.  Exam reveals no friction rub.   No murmur heard.         Pulmonary/Chest: Effort normal and breath sounds normal. No respiratory distress. She has no wheezes. She has no rales.  Abdominal: Soft. Bowel sounds are normal. She exhibits no distension and no mass. There is tenderness. There is guarding. There is no rebound.  Neurological: She is alert and oriented to person, place, and time.  Skin: Skin is warm and dry. No rash  noted. No erythema.       No ulcers easily identified  Psychiatric: She has a normal mood and affect.          Assessment & Plan:

## 2010-09-15 NOTE — Assessment & Plan Note (Signed)
This was found on CT scan.  Will send for TV u/s for further evaluation.

## 2010-09-15 NOTE — Assessment & Plan Note (Signed)
Baclofen doesn't work.  Will try Flexeril.

## 2010-09-15 NOTE — Assessment & Plan Note (Signed)
Pt with multiple UTI's in the past year and she has been hospitalized.  Will start daily ppx abx to try and keep her out of the hospital.
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
## 2010-09-24 ENCOUNTER — Ambulatory Visit (HOSPITAL_COMMUNITY): Payer: Medicare Other | Attending: Family Medicine

## 2010-09-24 ENCOUNTER — Ambulatory Visit (HOSPITAL_COMMUNITY): Admission: RE | Admit: 2010-09-24 | Payer: Medicare Other | Source: Ambulatory Visit

## 2010-09-26 LAB — CBC
HCT: 29 % — ABNORMAL LOW (ref 36.0–46.0)
Hemoglobin: 9.5 g/dL — ABNORMAL LOW (ref 12.0–15.0)
Hemoglobin: 10.7 g/dL — ABNORMAL LOW (ref 12.0–15.0)
MCHC: 32.9 g/dL (ref 30.0–36.0)
MCHC: 33.7 g/dL (ref 30.0–36.0)
MCHC: 33.6 g/dL (ref 30.0–36.0)
MCV: 83.8 fL (ref 78.0–100.0)
MCV: 84.1 fL (ref 78.0–100.0)
Platelets: 319 10*3/uL (ref 150–400)
Platelets: 369 10*3/uL (ref 150–400)
RBC: 3.56 MIL/uL — ABNORMAL LOW (ref 3.87–5.11)
RDW: 17.9 % — ABNORMAL HIGH (ref 11.5–15.5)
RDW: 18.3 % — ABNORMAL HIGH (ref 11.5–15.5)
RDW: 17.6 % — ABNORMAL HIGH (ref 11.5–15.5)

## 2010-09-26 LAB — CULTURE, BLOOD (ROUTINE X 2)
Culture: NO GROWTH
Culture: NO GROWTH

## 2010-09-26 LAB — URINE CULTURE: Colony Count: >=100000

## 2010-09-26 LAB — URINALYSIS, ROUTINE W REFLEX MICROSCOPIC
Bilirubin Urine: NEGATIVE
Glucose, UA: NEGATIVE mg/dL
Protein, ur: 100 mg/dL — AB
Urobilinogen, UA: 1 mg/dL (ref 0.0–1.0)

## 2010-09-26 LAB — DIFFERENTIAL
Basophils Relative: 0 % (ref 0–1)
Eosinophils Absolute: 0 10*3/uL (ref 0.0–0.7)
Lymphs Abs: 1.7 10*3/uL (ref 0.7–4.0)
Monocytes Absolute: 1 10*3/uL (ref 0.1–1.0)
Monocytes Relative: 4 % (ref 3–12)

## 2010-09-26 LAB — COMPREHENSIVE METABOLIC PANEL
ALT: 16 U/L (ref 0–35)
AST: 22 U/L (ref 0–37)
Albumin: 3.7 g/dL (ref 3.5–5.2)
Alkaline Phosphatase: 92 U/L (ref 39–117)
Calcium: 9 mg/dL (ref 8.4–10.5)
GFR calc Af Amer: >60 mL/min (ref >60)
Potassium: 3.6 mEq/L (ref 3.5–5.1)
Sodium: 136 mEq/L (ref 135–145)
Total Protein: 7.9 g/dL (ref 6.0–8.3)

## 2010-09-26 LAB — BASIC METABOLIC PANEL
BUN: 3 mg/dL — ABNORMAL LOW (ref 6–23)
CO2: 22 mEq/L (ref 19–32)
GFR calc non Af Amer: >60 mL/min (ref >60)
Glucose, Bld: 91 mg/dL (ref 70–99)
Potassium: 3.4 mEq/L — ABNORMAL LOW (ref 3.5–5.1)

## 2010-09-26 LAB — URINE MICROSCOPIC-ADD ON

## 2010-09-26 LAB — LACTIC ACID, PLASMA: Lactic Acid, Venous: 1.8 mmol/L (ref 0.5–2.2)

## 2010-10-30 ENCOUNTER — Encounter: Payer: Self-pay | Admitting: Family Medicine
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
## 2010-11-07 NOTE — Op Note (Signed)
Tracey Hall, Tracey Hall               ACCOUNT NO.:  [id number]   MEDICAL RECORD NO.:  [id number]          PATIENT TYPE:  INP   LOCATION:  9132                          [facility]ACILITY:  [facility]   PHYSICIAN:  Conni Elliot, M.D.DATE OF BIRTH:  10/27/1980   DATE OF PROCEDURE:  04/26/2004  DATE OF DISCHARGE:  04/26/2004                                 OPERATIVE REPORT   PREOPERATIVE DIAGNOSES:  Desire for surgical sterilization.   POSTOPERATIVE DIAGNOSES:  Desire for surgical sterilization.   OPERATION:  Modified bilateral Pomeroy tubal ligation.   SURGEON:  Conni Elliot, M.D.   ANESTHESIA:  Failed regional anesthetic and patient went to [facility].   DESCRIPTION OF PROCEDURE:  After placing the patient supine, the abdomen was  prepped and draped in a sterile fashion.  A periumbilical incision was made,  incision made in the skin in a transverse fashion, peritoneal cavity  entered. The tube identified, grasped with Babcock clamps, followed to the  fimbriated end.  __________ was brought into the operative field, doubly  suture ligated.  Approximately a 1 1/2 to 2 cm segment excised. Hemostasis  was adequate. A similar procedure was done on the opposite side. Anterior  peritoneum fascia __________ fashion. Estimated blood loss less than 10 mL.  Sponge and needle count correct.      ASG/MEDQ  D:  05/27/2004  T:  05/28/2004  Job:  [id number]

## 2010-11-07 NOTE — Discharge Summary (Signed)
Tracey, Hall               ACCOUNT NO.:  [id number]   MEDICAL RECORD NO.:  [id number]          PATIENT TYPE:  INP   LOCATION:  2916                         FACILITY:  [facility]   PHYSICIAN:  [name], M.D.DATE OF BIRTH:  1980-12-20   DATE OF ADMISSION:  03/28/2008  DATE OF DISCHARGE:  03/29/2008                               DISCHARGE SUMMARY   DATE OF AMA LEAVING:  March 29, 2008.   PRIMARY CARE Tracey Hall:  Unknown.   DISCHARGE DIAGNOSES:  1. Fever.  2. Quadriplegia.  3. Hypotension.  4. Hypokalemia.  5. Neck pain.   DISCHARGE MEDICATIONS:  The patient left AMA; however, we were able to  get her to take a prescription for Keflex 500 mg b.i.d. x14 days.  Otherwise, her home meds, she takes no home meds.  She had no other  medications.   CONSULTS:  None.   PROCEDURES:  The patient while in the hospital had an abdominal CT  without contrast showing negative for urinary tract stones, marked  dilation of the ureters extending for approximately L3-4 level in the  mid sacrum without cause of obstruction, appearances are not suggestive  of primary megaureter.  This can be a cause of recurrent urinary tract  infection and is due to a poor ureteral peristalsis.  She also had a CT  of the pelvis without contrast.  Impression from that was no acute  finding in the pelvis.  Umbilical hernia is unchanged in appearance.  She also had a 2-view chest x-ray which showed no acute disease.   LABORATORY DATA:  On admission, she had a UA that was amber in color,  turbid, had a normal specific gravity, and pH was negative for urine  glucose that showed small bilirubin, trace ketones, moderate blood, 100  proteins, 2 urobilinogens, positive nitrites, and large leukocytes.  She  had a urine microscopic that showed too numerous white blood cells to  count.  On admission, her sodium was 136, potassium 3.3, chloride 107,  bicarb was 18, BUN 10, creatinine 1.1, and her glucose was 93.   She also  had calcium of 1.03 on the i-STAT, Chem-8.  She also had a CBC that was  done on admission, where white blood cell count was 18.8, hemoglobin of  11.6, hematocrit 35.5, and platelets of 297.  Her potassium was 4.  On  repeat BMET was 2.9, it was repleted and came up to 4.5.  Blood cultures  are still pending that showed no growth to date.  Her urine culture is  back, and it shows that she has Klebsiella pneumoniae, sensitive to  Keflex.  So, this was a choice for the patient to consider.   BRIEF HOSPITAL COURSE:  1. The patient came in complaining of fever that was sudden in onset,      that happened last night, fever up to 103.  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  She has eczema, history of  pyelonephritis and she has hydronephrosis.  She on the second day      of hospitalization was asking to go home.  The patient felt much      better.  She had a max temperature of 100.2.  The rest of her vital      signs were stable, sating 98-100% on room air.  Her fever she had      been afebrile for the last 24 hours.  Her highest temperature has      been 100.2.  The patient had UTI that was being treated as a      complicated UTI with ceftriaxone and ampicillin; however, since she      left AMA she was sent home with Keflex x14 days.  2. Quadriplegia.  She was continued to monitor.  She was on no      medications for the quadriplegia.  3. Hypotension, which resolved.  Her BPs were stable in the last 24      hours.  4. Hypokalemia, which had resolved from 2.9 to 4.5.  5. She was having neck pain during the hospitalization and that was      still present the morning of the day she left AMA.  Further      followup with this will have to be done outside with her PCP.  The      patient did not have a followup appointment, as she left AMA,      however, she is encouraged to make her own  appointment.   DISCHARGE CONDITION:  The patient left AMA.      Jamie Brookes, MD  Electronically Signed      [name], M.D.  Electronically Signed    AS/MEDQ  D:  04/02/2008  T:  04/03/2008  Job:  [id number]

## 2010-12-01 ENCOUNTER — Encounter: Payer: Self-pay | Admitting: Family Medicine

## 2010-12-01 ENCOUNTER — Ambulatory Visit (INDEPENDENT_AMBULATORY_CARE_PROVIDER_SITE_OTHER): Payer: Medicare Other | Admitting: Family Medicine

## 2010-12-01 VITALS — BP 150/90 | HR 92 | Temp 98.1°F

## 2010-12-01 DIAGNOSIS — J069 Acute upper respiratory infection, unspecified: Secondary | ICD-10-CM

## 2010-12-01 DIAGNOSIS — J45909 Unspecified asthma, uncomplicated: Secondary | ICD-10-CM

## 2010-12-01 DIAGNOSIS — J029 Acute pharyngitis, unspecified: Secondary | ICD-10-CM

## 2010-12-01 MED ORDER — IBUPROFEN 600 MG PO TABS: 600.0000 mg | ORAL_TABLET | Freq: Four times a day (QID) | ORAL | Status: AC | PRN

## 2010-12-01 MED ORDER — FLUTICASONE-SALMETEROL 100-50 MCG/DOSE IN AEPB: 1.0000 | INHALATION_SPRAY | Freq: Two times a day (BID) | RESPIRATORY_TRACT | Status: DC

## 2010-12-01 MED ORDER — FLUTICASONE PROPIONATE 50 MCG/ACT NA SUSP: 1.0000 | Freq: Every day | NASAL | Status: DC

## 2010-12-01 NOTE — Assessment & Plan Note (Signed)
Discussed course and expected outcome of viral URI.  May use 10 days of ibuprofen, and nasal steroid to control symptoms.

## 2010-12-01 NOTE — Progress Notes (Signed)
  Subjective:    Patient ID: Tracey Hall, female    DOB: 09/11/1980, 30 y.o.   MRN: [id number]  HPI Sore throat for one day, denies fever.  Unable to breath through nose last night.  Kids are sick at home on and off.  Denies cough. History of asthma, only uses Advair occasionally.   Wheelchair bound for years, quadriplegic.   Review of Systems  Constitutional: Negative for fever and chills.  HENT: Positive for congestion, sore throat and sinus pressure.   Respiratory: Positive for cough. Negative for wheezing.        Objective:   Physical Exam  Constitutional:       Alert, confined to wheelchair with obvious lack of use of hands or legs.   HENT:  Right Ear: External ear normal.  Left Ear: External ear normal.       Swollen uvula, swollen red turbinates   Eyes: Right eye exhibits no discharge. Left eye exhibits no discharge.  Neck: Normal range of motion.  Cardiovascular: Normal rate, regular rhythm and normal heart sounds.   Pulmonary/Chest: Effort normal and breath sounds normal. She has no wheezes.  Abdominal: Soft. Bowel sounds are normal. She exhibits no distension. There is no tenderness.  Lymphadenopathy:    She has cervical adenopathy.  Neurological: She exhibits abnormal muscle tone.          Assessment & Plan:

## 2010-12-01 NOTE — Assessment & Plan Note (Signed)
Discussed that she may have an asthma flair and to begin Advair, refilled.

## 2010-12-01 NOTE — Patient Instructions (Addendum)
You have a upper respiratory infection, the sore throat and congestion is the first symptom, you will likely develop a cough, please begin your inhailer. Expect to be sick for 10-14 days If you develop a high fever or  You cannot breathe please seek medical attention

## 2010-12-04 ENCOUNTER — Other Ambulatory Visit: Payer: Self-pay | Admitting: Family Medicine

## 2010-12-04 NOTE — Telephone Encounter (Signed)
Refill request

## 2011-01-16 ENCOUNTER — Encounter: Payer: Self-pay | Admitting: Family Medicine

## 2011-01-16 ENCOUNTER — Ambulatory Visit (INDEPENDENT_AMBULATORY_CARE_PROVIDER_SITE_OTHER): Payer: Medicare Other | Admitting: Family Medicine

## 2011-01-16 VITALS — BP 91/61 | HR 96

## 2011-01-16 DIAGNOSIS — R300 Dysuria: Secondary | ICD-10-CM

## 2011-01-16 DIAGNOSIS — Z87440 Personal history of urinary (tract) infections: Secondary | ICD-10-CM

## 2011-01-16 DIAGNOSIS — J45909 Unspecified asthma, uncomplicated: Secondary | ICD-10-CM

## 2011-01-16 MED ORDER — CLONAZEPAM 1 MG PO TABS: 1.0000 mg | ORAL_TABLET | Freq: Two times a day (BID) | ORAL | Status: DC | PRN

## 2011-01-16 MED ORDER — FLUTICASONE-SALMETEROL 100-50 MCG/DOSE IN AEPB: 1.0000 | INHALATION_SPRAY | Freq: Two times a day (BID) | RESPIRATORY_TRACT | Status: DC

## 2011-01-16 MED ORDER — ALBUTEROL SULFATE HFA 108 (90 BASE) MCG/ACT IN AERS: 2.0000 | INHALATION_SPRAY | Freq: Four times a day (QID) | RESPIRATORY_TRACT | Status: DC | PRN

## 2011-01-16 MED ORDER — SULFAMETHOXAZOLE-TRIMETHOPRIM 400-80 MG PO TABS: 1.0000 | ORAL_TABLET | Freq: Two times a day (BID) | ORAL | Status: AC

## 2011-01-16 NOTE — Progress Notes (Signed)
  Subjective:    Patient ID: Tracey Hall, female    DOB: 1980-12-09, 30 y.o.   MRN: [id number]  HPI Pt here to meet new MD, for renewal of handicap placard, to have a DMV form filled out, medication refill and to discuss possible recurrent UTI.   1. Recurrent UTI: hx of neurogenic bladder, self-caths. Frequent UTIs. Pt describes lower abdominal cramping and feeling flush when she self caths x 3 days. She reports nausea at baseline. She denies fever, vomiting, diarrhea. Last UTI was in March.    Review of Systems As per HPI     Objective:   Physical Exam  Nursing note and vitals reviewed. Constitutional: She appears well-developed and well-nourished. No distress.  HENT:  Head: Normocephalic and atraumatic.  Eyes: Pupils are equal, round, and reactive to light.  Cardiovascular: Normal rate, regular rhythm and normal heart sounds.   Pulmonary/Chest: Effort normal.  Abdominal: Soft. Bowel sounds are normal.  Musculoskeletal:       Scoliosis  Skin: Skin is warm and dry. She is not diaphoretic.  Exam limited by pt wheelchair bound and refusal to be transitioned to exam table.         Assessment & Plan:   No problem-specific assessment & plan notes found for this encounter.

## 2011-01-22 NOTE — Assessment & Plan Note (Signed)
Pt using albuterol and advair. Continue current management. Albuterol prn.

## 2011-01-22 NOTE — Assessment & Plan Note (Signed)
Pt not taking daily ppx antibiotics. Plan to treat with a course of Bactrim given pt's risk factors. Pt instructed to self cath at home and bring urine back to clinic for analysis and culture.

## 2011-03-19 LAB — POCT I-STAT, CHEM 8
Calcium, Ion: 1.12
Creatinine, Ser: 0.8
Hemoglobin: 13.3
Sodium: 139
TCO2: 20

## 2011-03-19 LAB — URINALYSIS, ROUTINE W REFLEX MICROSCOPIC
Ketones, ur: NEGATIVE
Nitrite: NEGATIVE
Protein, ur: NEGATIVE

## 2011-03-19 LAB — URINE CULTURE: Colony Count: >=100000

## 2011-03-19 LAB — URINE MICROSCOPIC-ADD ON

## 2011-03-19 LAB — POCT CARDIAC MARKERS: Myoglobin, poc: 219

## 2011-03-23 LAB — DIFFERENTIAL
Basophils Absolute: 0
Basophils Relative: 0
Eosinophils Relative: 0
Lymphocytes Relative: 4 — ABNORMAL LOW
Monocytes Absolute: 0.4
Neutro Abs: 17.7 — ABNORMAL HIGH

## 2011-03-23 LAB — CBC
HCT: 35.5 — ABNORMAL LOW
Hemoglobin: 11.6 — ABNORMAL LOW
MCHC: 32.8
Platelets: 297
RDW: 16.8 — ABNORMAL HIGH

## 2011-03-23 LAB — URINALYSIS, ROUTINE W REFLEX MICROSCOPIC
Glucose, UA: NEGATIVE
Nitrite: POSITIVE — AB
Specific Gravity, Urine: 1.026
pH: 6.5

## 2011-03-23 LAB — BASIC METABOLIC PANEL
CO2: 20
Chloride: 105
Creatinine, Ser: 0.64
GFR calc Af Amer: >60
Potassium: 2.9 — ABNORMAL LOW

## 2011-03-23 LAB — POCT I-STAT, CHEM 8
Calcium, Ion: 1.03 — ABNORMAL LOW
Creatinine, Ser: 1.1
Glucose, Bld: 93
HCT: 31 — ABNORMAL LOW
Hemoglobin: 10.5 — ABNORMAL LOW

## 2011-03-23 LAB — CULTURE, BLOOD (ROUTINE X 2)
Culture: NO GROWTH
Culture: NO GROWTH

## 2011-03-23 LAB — URINE CULTURE

## 2011-03-23 LAB — URINE MICROSCOPIC-ADD ON

## 2011-04-06 ENCOUNTER — Telehealth: Payer: Self-pay | Admitting: Family Medicine

## 2011-04-06 NOTE — Telephone Encounter (Signed)
Need a rx written to fax to [facility] for an outdoor power wheelchair.  Can fax rx to [phone]  Attn# Mia Creek.  Need to fax asap.

## 2011-04-10 NOTE — Telephone Encounter (Signed)
Tracey Hall is calling back to see what the status of getting another Power Wheelchair.  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

## 2011-04-16 NOTE — Telephone Encounter (Signed)
Called pt at home. Left voicemail. Need to know if pt's current wheelchair is no longer functional, and if it is no longer functional has she tried to have it maintenanced? I need this information before I can send a script for a new one. The phone number for [facility] is [phone].

## 2011-04-20 ENCOUNTER — Telehealth: Payer: Self-pay | Admitting: Family Medicine

## 2011-04-20 NOTE — Telephone Encounter (Signed)
Is needing an Rx for a Power Wheelchair needs to go to Attention Tracey Hall faxed to [phone].

## 2011-04-22 NOTE — Telephone Encounter (Signed)
Pt called again and needs an outdoors power chair - the chair she has is more for indoor use and hard to get around in.  She wants to drive and have use of this chair for more than in her house.  pls call patient to let her know status of this.  She has been waiting several days for an answer and did not get VM from Dr Armen Pickup last week. pls let her know this can be done.

## 2011-04-22 NOTE — Telephone Encounter (Signed)
Called patient. Her current chair is functioning fine. She desires another chair for outdoor use. She is under the impression that [facility] will pay for the chair and will not be billing medicare for it. I will verify this with Mia Creek at [facility] if this is the case, I will fax the prescription this afternoon. I called Mia Creek, she was not in the office. I will try again at 1:30 PM.

## 2011-04-24 NOTE — Telephone Encounter (Signed)
Called [facility] again. I need to know the payer source for the power wheelchair before I send a script. If the plan is to bill medicare, the chair is not medically necessary and the pt will have to wait.

## 2011-05-05 NOTE — Telephone Encounter (Signed)
Tracey Hall called back to say that this will not be billed thru medicare and needs script faxed to her.  Her chair is too big for her and needs one that is more accommodating for her

## 2011-05-08 NOTE — Telephone Encounter (Signed)
Script faxed to Mia Creek.

## 2011-06-15 ENCOUNTER — Emergency Department (HOSPITAL_COMMUNITY): Payer: Medicare Other

## 2011-06-15 ENCOUNTER — Emergency Department (HOSPITAL_COMMUNITY)
Admission: EM | Admit: 2011-06-15 | Discharge: 2011-06-16 | Disposition: A | Discharge status: Home / Self Care | Payer: Medicare Other | Attending: Emergency Medicine | Admitting: Emergency Medicine

## 2011-06-15 ENCOUNTER — Other Ambulatory Visit: Payer: Self-pay

## 2011-06-15 DIAGNOSIS — R000 Tachycardia, unspecified: Secondary | ICD-10-CM | POA: Insufficient documentation

## 2011-06-15 DIAGNOSIS — S0180XA Unspecified open wound of other part of head, initial encounter: Secondary | ICD-10-CM | POA: Insufficient documentation

## 2011-06-15 DIAGNOSIS — M62838 Other muscle spasm: Secondary | ICD-10-CM | POA: Insufficient documentation

## 2011-06-15 DIAGNOSIS — IMO0001 Reserved for inherently not codable concepts without codable children: Secondary | ICD-10-CM | POA: Insufficient documentation

## 2011-06-15 DIAGNOSIS — R292 Abnormal reflex: Secondary | ICD-10-CM | POA: Insufficient documentation

## 2011-06-15 DIAGNOSIS — R109 Unspecified abdominal pain: Secondary | ICD-10-CM | POA: Insufficient documentation

## 2011-06-15 DIAGNOSIS — R0602 Shortness of breath: Secondary | ICD-10-CM | POA: Insufficient documentation

## 2011-06-15 DIAGNOSIS — R51 Headache: Secondary | ICD-10-CM | POA: Insufficient documentation

## 2011-06-15 DIAGNOSIS — M542 Cervicalgia: Secondary | ICD-10-CM | POA: Insufficient documentation

## 2011-06-15 DIAGNOSIS — S0003XA Contusion of scalp, initial encounter: Secondary | ICD-10-CM | POA: Insufficient documentation

## 2011-06-15 DIAGNOSIS — G8250 Quadriplegia, unspecified: Secondary | ICD-10-CM | POA: Insufficient documentation

## 2011-06-15 DIAGNOSIS — R279 Unspecified lack of coordination: Secondary | ICD-10-CM | POA: Insufficient documentation

## 2011-06-15 DIAGNOSIS — M25519 Pain in unspecified shoulder: Secondary | ICD-10-CM | POA: Insufficient documentation

## 2011-06-15 DIAGNOSIS — E669 Obesity, unspecified: Secondary | ICD-10-CM | POA: Insufficient documentation

## 2011-06-15 DIAGNOSIS — R220 Localized swelling, mass and lump, head: Secondary | ICD-10-CM | POA: Insufficient documentation

## 2011-06-15 DIAGNOSIS — M26629 Arthralgia of temporomandibular joint, unspecified side: Secondary | ICD-10-CM | POA: Insufficient documentation

## 2011-06-15 DIAGNOSIS — R0789 Other chest pain: Secondary | ICD-10-CM | POA: Insufficient documentation

## 2011-06-15 LAB — COMPREHENSIVE METABOLIC PANEL
ALT: 21 U/L (ref 0–35)
AST: 33 U/L (ref 0–37)
Albumin: 3.6 g/dL (ref 3.5–5.2)
CO2: 18 mEq/L — ABNORMAL LOW (ref 19–32)
Calcium: 9.6 mg/dL (ref 8.4–10.5)
Creatinine, Ser: 0.55 mg/dL (ref 0.50–1.10)
GFR calc non Af Amer: >90 mL/min (ref >90)
Sodium: 139 mEq/L (ref 135–145)

## 2011-06-15 LAB — POCT I-STAT, CHEM 8
Calcium, Ion: 1.17 mmol/L (ref 1.12–1.32)
Glucose, Bld: 84 mg/dL (ref 70–99)
HCT: 31 % — ABNORMAL LOW (ref 36.0–46.0)
Hemoglobin: 10.5 g/dL — ABNORMAL LOW (ref 12.0–15.0)
Potassium: 3.8 mEq/L (ref 3.5–5.1)

## 2011-06-15 LAB — CBC
HCT: 33.2 % — ABNORMAL LOW (ref 36.0–46.0)
Hemoglobin: 11 g/dL — ABNORMAL LOW (ref 12.0–15.0)
MCH: 23.9 pg — ABNORMAL LOW (ref 26.0–34.0)
MCHC: 33.1 g/dL (ref 30.0–36.0)
MCV: 72 fL — ABNORMAL LOW (ref 78.0–100.0)
RBC: 4.61 MIL/uL (ref 3.87–5.11)

## 2011-06-15 MED ORDER — LORAZEPAM 2 MG/ML IJ SOLN
1.0000 mg | Freq: Once | INTRAMUSCULAR | Status: AC
  Administered 2011-06-15: 1 mg via INTRAVENOUS
  Filled 2011-06-15: qty 1

## 2011-06-15 MED ORDER — SODIUM CHLORIDE 0.9 % IV BOLUS (SEPSIS)
500.0000 mL | Freq: Once | INTRAVENOUS | Status: AC
  Administered 2011-06-16: 500 mL via INTRAVENOUS

## 2011-06-15 MED ORDER — IOHEXOL 300 MG/ML  SOLN
100.0000 mL | Freq: Once | INTRAMUSCULAR | Status: AC | PRN
  Administered 2011-06-15: 100 mL via INTRAVENOUS
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
## 2011-06-18 ENCOUNTER — Encounter: Payer: Self-pay | Admitting: Family Medicine

## 2011-06-18 ENCOUNTER — Ambulatory Visit (INDEPENDENT_AMBULATORY_CARE_PROVIDER_SITE_OTHER): Payer: Medicare Other | Admitting: Family Medicine

## 2011-06-18 DIAGNOSIS — S161XXA Strain of muscle, fascia and tendon at neck level, initial encounter: Secondary | ICD-10-CM | POA: Insufficient documentation

## 2011-06-18 DIAGNOSIS — R519 Headache, unspecified: Secondary | ICD-10-CM | POA: Insufficient documentation

## 2011-06-18 DIAGNOSIS — S139XXA Sprain of joints and ligaments of unspecified parts of neck, initial encounter: Secondary | ICD-10-CM

## 2011-06-18 DIAGNOSIS — R51 Headache: Secondary | ICD-10-CM | POA: Insufficient documentation

## 2011-06-18 MED ORDER — CYCLOBENZAPRINE HCL 10 MG PO TABS: 10.0000 mg | ORAL_TABLET | Freq: Three times a day (TID) | ORAL | Status: AC | PRN

## 2011-06-18 MED ORDER — HYDROCODONE-ACETAMINOPHEN 5-500 MG PO TABS: 1.0000 | ORAL_TABLET | Freq: Three times a day (TID) | ORAL | Status: AC | PRN

## 2011-06-18 NOTE — Patient Instructions (Signed)
Thank you for coming in today. I dont think you have anything broken.  Take the flexeril for spasm and ibuprofen and vicodin for pain as needed.  Rest and do not exert you mind too much over the next few days.  I think have a concussion.   Come back on Monday or Tuesday.   Concussion and Brain Injury A blow or jolt to the head can disrupt the normal function of the brain. This type of brain injury is often called a "concussion" or a "closed head injury." Concussions are usually not life-threatening. Even so, the effects of a concussion can be serious.   CAUSES   A concussion is caused by a blunt blow to the head. The blow might be direct or indirect as described below.  Direct blow (running into another player during a soccer game, being hit in a fight, or hitting your head on a hard surface).     Indirect blow (when your head moves rapidly and violently back and forth like in a car crash).  SYMPTOMS   The brain is very complex. Every head injury is different. Some symptoms may appear right away. Other symptoms may not show up for days or weeks after the concussion. The signs of concussion can be hard to notice. Early on, problems may be missed by patients, family members, and caregivers. You may look fine even though you are acting or feeling differently.   These symptoms are usually temporary, but may last for days, weeks, or even longer. Symptoms include:  Mild headaches that will not go away.     Having more trouble than usual with:     Remembering things.     Paying attention or concentrating.     Organizing daily tasks.     Making decisions and solving problems.     Slowness in thinking, acting, speaking, or reading.     Getting lost or easily confused.     Feeling tired all the time or lacking energy (fatigue).     Feeling drowsy.     Sleep disturbances.     Sleeping more than usual.     Sleeping less than usual.     Trouble falling asleep.     Trouble sleeping  (insomnia).     Loss of balance or feeling lightheaded or dizzy.     Nausea or vomiting.     Numbness or tingling.     Increased sensitivity to:     Sounds.    Lights.    Distractions.  Other symptoms might include:  Vision problems or eyes that tire easily.     Diminished sense of taste or smell.     Ringing in the ears.     Mood changes such as feeling sad, anxious, or listless.     Becoming easily irritated or angry for little or no reason.     Lack of motivation.  DIAGNOSIS   Your caregiver can usually diagnose a concussion or mild brain injury based on your description of your injury and your symptoms.   Your evaluation might include:  A brain scan to look for signs of injury to the brain. Even if the test shows no injury, you may still have a concussion.     Blood tests to be sure other problems are not present.  TREATMENT    People with a concussion need to be examined and evaluated. Most people with concussions are treated in an emergency department, urgent care, or clinic. Some people must  stay in the hospital overnight for further treatment.     Your caregiver will send you home with important instructions to follow. Be sure to carefully follow them.     Tell your caregiver if you are already taking any medicines (prescription, over-the-counter, or natural remedies), or if you are drinking alcohol or taking illegal drugs. Also, talk with your caregiver if you are taking blood thinners (anticoagulants) or aspirin. These drugs may increase your chances of complications. All of this is important information that may affect treatment.     Only take over-the-counter or prescription medicines for pain, discomfort, or fever as directed by your caregiver.  PROGNOSIS   How fast people recover from brain injury varies from person to person. Although most people have a good recovery, how quickly they improve depends on many factors. These factors include how severe their  concussion was, what part of the brain was injured, their age, and how healthy they were before the concussion.   Because all head injuries are different, so is recovery. Most people with mild injuries recover fully. Recovery can take time. In general, recovery is slower in older persons. Also, persons who have had a concussion in the past or have other medical problems may find that it takes longer to recover from their current injury. Anxiety and depression may also make it harder to adjust to the symptoms of brain injury. HOME CARE INSTRUCTIONS   Return to your normal activities slowly, not all at once. You must give your body and brain enough time for recovery.  Get plenty of sleep at night, and rest during the day. Rest helps the brain to heal.     Avoid staying up late at night.     Keep the same bedtime hours on weekends and weekdays.     Take daytime naps or rest breaks when you feel tired.     Limit activities that require a lot of thought or concentration (brain or cognitive rest). This includes:     Homework or job-related work.     Watching TV.     Computer work.     Avoid activities that could lead to a second brain injury, such as contact or recreational sports, until your caregiver says it is okay. Even after your brain injury has healed, you should protect yourself from having another concussion.     Ask your caregiver when you can return to your normal activities such as driving, bicycling, or operating heavy equipment. Your ability to react may be slower after a brain injury.     Talk with your caregiver about when you can return to work or school.     Inform your teachers, school nurse, school counselor, coach, Event organiser, or work Production designer, theatre/television/film about your injury, symptoms, and restrictions. They should be instructed to report:     Increased problems with attention or concentration.     Increased problems remembering or learning new information.     Increased time  needed to complete tasks or assignments.     Increased irritability or decreased ability to cope with stress.     Increased symptoms.     Take only those medicines that your caregiver has approved.     Do not drink alcohol until your caregiver says you are well enough to do so. Alcohol and certain other drugs may slow your recovery and can put you at risk of further injury.     If it is harder than usual to remember things, write  them down.     If you are easily distracted, try to do one thing at a time. For example, do not try to watch TV while fixing dinner.     Talk with family members or close friends when making important decisions.     Keep all follow-up appointments. Repeated evaluation of your symptoms is recommended for your recovery.  PREVENTION   Protect your head from future injury. It is very important to avoid another head or brain injury before you have recovered. In rare cases, another injury has lead to permanent brain damage, brain swelling, or death. Avoid injuries by using:  Seatbelts when riding in a car.     Alcohol only in moderation.     A helmet when biking, skiing, skateboarding, skating, or doing similar activities.     Safety measures in your home.     Remove clutter and tripping hazards from floors and stairways.     Use grab bars in bathrooms and handrails by stairs.     Place non-slip mats on floors and in bathtubs.     Improve lighting in dim areas.  SEEK MEDICAL CARE IF:   A head injury can cause lingering symptoms. You should seek medical care if you have any of the following symptoms for more than 3 weeks after your injury or are planning to return to sports:  Chronic headaches.     Dizziness or balance problems.     Nausea.    Vision problems.     Increased sensitivity to noise or light.     Depression or mood swings.     Anxiety or irritability.     Memory problems.     Difficulty concentrating or paying attention.     Sleep  problems.     Feeling tired all the time.  SEEK IMMEDIATE MEDICAL CARE IF:   You have had a blow or jolt to the head and you (or your family or friends) notice:  Severe or worsening headaches.     Weakness (even if only in one hand or one leg or one part of the face), numbness, or decreased coordination.     Repeated vomiting.     Increased sleepiness or passing out.     One black center of the eye (pupil) is larger than the other.     Convulsions (seizures).     Slurred speech.     Increasing confusion, restlessness, agitation, or irritability.     Lack of ability to recognize people or places.     Neck pain.     Difficulty being awakened.     Unusual behavior changes.     Loss of consciousness.  Older adults with a brain injury may have a higher risk of serious complications such as a blood clot on the brain. Headaches that get worse or an increase in confusion are signs of this complication. If these signs occur, see a caregiver right away. MAKE SURE YOU:    Understand these instructions.     Will watch your condition.     Will get help right away if you are not doing well or get worse.  FOR MORE INFORMATION   Several groups help people with brain injury and their families. They provide information and put people in touch with local resources. These include support groups, rehabilitation services, and a variety of health care professionals. Among these groups, the Brain Injury Association (BIA, www.biausa.org) has a Secretary/administrator that gathers scientific and educational information and  works on a national level to help people with brain injury.   Document Released: 08/29/2003 Document Revised: 02/18/2011 Document Reviewed: 01/25/2008 [facility] Patient Information 2012 [place], [place].
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
## 2011-06-26 ENCOUNTER — Emergency Department (HOSPITAL_COMMUNITY)
Admission: EM | Admit: 2011-06-26 | Discharge: 2011-06-27 | Disposition: A | Discharge status: Home / Self Care | Payer: Medicare Other | Attending: Emergency Medicine | Admitting: Emergency Medicine

## 2011-06-26 ENCOUNTER — Encounter (HOSPITAL_COMMUNITY): Payer: Self-pay | Admitting: *Deleted

## 2011-06-26 DIAGNOSIS — Z79899 Other long term (current) drug therapy: Secondary | ICD-10-CM | POA: Insufficient documentation

## 2011-06-26 DIAGNOSIS — Z7982 Long term (current) use of aspirin: Secondary | ICD-10-CM | POA: Insufficient documentation

## 2011-06-26 DIAGNOSIS — R51 Headache: Secondary | ICD-10-CM | POA: Diagnosis not present

## 2011-06-26 DIAGNOSIS — H9319 Tinnitus, unspecified ear: Secondary | ICD-10-CM | POA: Insufficient documentation

## 2011-06-26 DIAGNOSIS — S0003XA Contusion of scalp, initial encounter: Secondary | ICD-10-CM | POA: Diagnosis not present

## 2011-06-26 DIAGNOSIS — R110 Nausea: Secondary | ICD-10-CM | POA: Insufficient documentation

## 2011-06-26 DIAGNOSIS — F0781 Postconcussional syndrome: Secondary | ICD-10-CM | POA: Diagnosis not present

## 2011-06-26 DIAGNOSIS — E669 Obesity, unspecified: Secondary | ICD-10-CM | POA: Insufficient documentation

## 2011-06-26 DIAGNOSIS — G8250 Quadriplegia, unspecified: Secondary | ICD-10-CM | POA: Insufficient documentation
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
## 2011-06-27 ENCOUNTER — Emergency Department (HOSPITAL_COMMUNITY): Payer: Medicare Other

## 2011-06-27 DIAGNOSIS — F0781 Postconcussional syndrome: Secondary | ICD-10-CM | POA: Diagnosis not present

## 2011-06-27 DIAGNOSIS — R51 Headache: Secondary | ICD-10-CM | POA: Diagnosis not present

## 2011-06-27 DIAGNOSIS — Z7982 Long term (current) use of aspirin: Secondary | ICD-10-CM | POA: Diagnosis not present

## 2011-06-27 DIAGNOSIS — Z79899 Other long term (current) drug therapy: Secondary | ICD-10-CM | POA: Diagnosis not present

## 2011-06-27 DIAGNOSIS — R110 Nausea: Secondary | ICD-10-CM | POA: Diagnosis not present

## 2011-06-27 DIAGNOSIS — E669 Obesity, unspecified: Secondary | ICD-10-CM | POA: Diagnosis not present

## 2011-06-27 DIAGNOSIS — S0003XA Contusion of scalp, initial encounter: Secondary | ICD-10-CM | POA: Diagnosis not present

## 2011-06-27 DIAGNOSIS — S1093XA Contusion of unspecified part of neck, initial encounter: Secondary | ICD-10-CM | POA: Diagnosis not present

## 2011-06-27 DIAGNOSIS — G8250 Quadriplegia, unspecified: Secondary | ICD-10-CM | POA: Diagnosis not present

## 2011-06-27 DIAGNOSIS — H9319 Tinnitus, unspecified ear: Secondary | ICD-10-CM | POA: Diagnosis not present

## 2011-06-27 LAB — POCT I-STAT, CHEM 8
Calcium, Ion: 1.17 mmol/L (ref 1.12–1.32)
HCT: 37 % (ref 36.0–46.0)
Hemoglobin: 12.6 g/dL (ref 12.0–15.0)
Sodium: 140 mEq/L (ref 135–145)
TCO2: 25 mmol/L (ref 0–100)

## 2011-06-27 MED ORDER — ONDANSETRON 8 MG PO TBDP: 8.0000 mg | ORAL_TABLET | Freq: Three times a day (TID) | ORAL | Status: AC | PRN

## 2011-06-27 MED ORDER — HYDROCODONE-ACETAMINOPHEN 5-325 MG PO TABS: 1.0000 | ORAL_TABLET | ORAL | Status: AC | PRN

## 2011-06-27 NOTE — ED Notes (Signed)
Patient transported to CT 
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
## 2011-07-01 ENCOUNTER — Ambulatory Visit: Payer: Medicare Other | Admitting: Family Medicine

## 2011-07-08 ENCOUNTER — Ambulatory Visit: Payer: Medicare Other | Admitting: Family Medicine
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
## 2011-08-05 NOTE — Telephone Encounter (Signed)
Called back. Left VM. There was no documentation showing that medicaid would not pay for the chair but I do know that chairs are not covered if they are damaged or non-functioning and can be replaced.

## 2011-09-21 ENCOUNTER — Ambulatory Visit: Payer: Medicare Other | Admitting: Family Medicine

## 2011-11-21 DIAGNOSIS — N12 Tubulo-interstitial nephritis, not specified as acute or chronic: Secondary | ICD-10-CM

## 2011-11-21 HISTORY — DX: Tubulo-interstitial nephritis, not specified as acute or chronic: N12

## 2011-12-14 ENCOUNTER — Inpatient Hospital Stay (HOSPITAL_COMMUNITY)
Admission: EM | Admit: 2011-12-14 | Discharge: 2011-12-16 | DRG: 689 | Disposition: A | Discharge status: Home Health | Payer: Medicare Other | Attending: Family Medicine | Admitting: Family Medicine

## 2011-12-14 ENCOUNTER — Inpatient Hospital Stay (HOSPITAL_COMMUNITY): Payer: Medicare Other

## 2011-12-14 ENCOUNTER — Encounter (HOSPITAL_COMMUNITY): Payer: Self-pay | Admitting: *Deleted

## 2011-12-14 DIAGNOSIS — N12 Tubulo-interstitial nephritis, not specified as acute or chronic: Secondary | ICD-10-CM

## 2011-12-14 DIAGNOSIS — D509 Iron deficiency anemia, unspecified: Secondary | ICD-10-CM | POA: Diagnosis present

## 2011-12-14 DIAGNOSIS — N10 Acute tubulo-interstitial nephritis: Secondary | ICD-10-CM | POA: Diagnosis not present

## 2011-12-14 DIAGNOSIS — Z79899 Other long term (current) drug therapy: Secondary | ICD-10-CM

## 2011-12-14 DIAGNOSIS — G8250 Quadriplegia, unspecified: Secondary | ICD-10-CM | POA: Diagnosis not present

## 2011-12-14 DIAGNOSIS — E860 Dehydration: Secondary | ICD-10-CM | POA: Diagnosis present

## 2011-12-14 DIAGNOSIS — IMO0002 Reserved for concepts with insufficient information to code with codable children: Secondary | ICD-10-CM

## 2011-12-14 DIAGNOSIS — N319 Neuromuscular dysfunction of bladder, unspecified: Secondary | ICD-10-CM | POA: Diagnosis present

## 2011-12-14 DIAGNOSIS — J45909 Unspecified asthma, uncomplicated: Secondary | ICD-10-CM

## 2011-12-14 DIAGNOSIS — D649 Anemia, unspecified: Secondary | ICD-10-CM | POA: Diagnosis present

## 2011-12-14 DIAGNOSIS — N390 Urinary tract infection, site not specified: Secondary | ICD-10-CM

## 2011-12-14 DIAGNOSIS — R509 Fever, unspecified: Secondary | ICD-10-CM | POA: Diagnosis not present

## 2011-12-14 HISTORY — DX: Quadriplegia, unspecified: G82.50

## 2011-12-14 LAB — URINALYSIS, ROUTINE W REFLEX MICROSCOPIC
Glucose, UA: NEGATIVE mg/dL
Ketones, ur: 40 mg/dL — AB
Nitrite: POSITIVE — AB
Protein, ur: >300 mg/dL — AB
Specific Gravity, Urine: 1.021 (ref 1.005–1.030)
Urobilinogen, UA: 4 mg/dL — ABNORMAL HIGH (ref 0.0–1.0)
pH: 5 (ref 5.0–8.0)

## 2011-12-14 LAB — CBC
HCT: 29.5 % — ABNORMAL LOW (ref 36.0–46.0)
Hemoglobin: 9.4 g/dL — ABNORMAL LOW (ref 12.0–15.0)
MCH: 22.6 pg — ABNORMAL LOW (ref 26.0–34.0)
MCHC: 31.9 g/dL (ref 30.0–36.0)
MCV: 70.9 fL — ABNORMAL LOW (ref 78.0–100.0)
Platelets: 351 10*3/uL (ref 150–400)
RBC: 4.16 MIL/uL (ref 3.87–5.11)
RDW: 19.5 % — ABNORMAL HIGH (ref 11.5–15.5)
WBC: 14.2 10*3/uL — ABNORMAL HIGH (ref 4.0–10.5)

## 2011-12-14 LAB — DIFFERENTIAL
Basophils Absolute: 0 10*3/uL (ref 0.0–0.1)
Basophils Relative: 0 % (ref 0–1)
Eosinophils Absolute: 0 10*3/uL (ref 0.0–0.7)
Eosinophils Relative: 0 % (ref 0–5)
Lymphocytes Relative: 13 % (ref 12–46)
Lymphs Abs: 1.9 10*3/uL (ref 0.7–4.0)
Monocytes Absolute: 1.5 10*3/uL — ABNORMAL HIGH (ref 0.1–1.0)
Monocytes Relative: 10 % (ref 3–12)
Neutro Abs: 10.8 10*3/uL — ABNORMAL HIGH (ref 1.7–7.7)
Neutrophils Relative %: 76 % (ref 43–77)

## 2011-12-14 LAB — URINE MICROSCOPIC-ADD ON

## 2011-12-14 LAB — LACTIC ACID, PLASMA: Lactic Acid, Venous: 1.4 mmol/L (ref 0.5–2.2)

## 2011-12-14 LAB — COMPREHENSIVE METABOLIC PANEL
ALT: 20 U/L (ref 0–35)
AST: 40 U/L — ABNORMAL HIGH (ref 0–37)
Albumin: 3.2 g/dL — ABNORMAL LOW (ref 3.5–5.2)
Alkaline Phosphatase: 68 U/L (ref 39–117)
BUN: 14 mg/dL (ref 6–23)
CO2: 20 mEq/L (ref 19–32)
Calcium: 8.7 mg/dL (ref 8.4–10.5)
Chloride: 102 mEq/L (ref 96–112)
Creatinine, Ser: 0.88 mg/dL (ref 0.50–1.10)
GFR calc Af Amer: >90 mL/min (ref >90)
GFR calc non Af Amer: 87 mL/min — ABNORMAL LOW (ref >90)
Glucose, Bld: 109 mg/dL — ABNORMAL HIGH (ref 70–99)
Potassium: 3.2 mEq/L — ABNORMAL LOW (ref 3.5–5.1)
Sodium: 136 mEq/L (ref 135–145)
Total Bilirubin: 0.2 mg/dL — ABNORMAL LOW (ref 0.3–1.2)
Total Protein: 7.4 g/dL (ref 6.0–8.3)

## 2011-12-14 MED ORDER — IBUPROFEN 400 MG PO TABS
400.0000 mg | ORAL_TABLET | Freq: Four times a day (QID) | ORAL | Status: DC | PRN
  Filled 2011-12-14: qty 1

## 2011-12-14 MED ORDER — ONDANSETRON HCL 4 MG/2ML IJ SOLN: 4.0000 mg | Freq: Four times a day (QID) | INTRAMUSCULAR | Status: DC | PRN

## 2011-12-14 MED ORDER — ONDANSETRON HCL 4 MG PO TABS: 4.0000 mg | ORAL_TABLET | Freq: Four times a day (QID) | ORAL | Status: DC | PRN

## 2011-12-14 MED ORDER — ACETAMINOPHEN 500 MG PO TABS
1000.0000 mg | ORAL_TABLET | Freq: Once | ORAL | Status: AC
  Administered 2011-12-14: 975 mg via ORAL
  Filled 2011-12-14: qty 2

## 2011-12-14 MED ORDER — DEXTROSE 5 % IV SOLN
1.0000 g | Freq: Once | INTRAVENOUS | Status: AC
  Administered 2011-12-14: 19:00:00 via INTRAVENOUS
  Filled 2011-12-14: qty 10

## 2011-12-14 MED ORDER — DEXTROSE 5 % IV SOLN
1.0000 g | INTRAVENOUS | Status: DC
  Administered 2011-12-14 – 2011-12-15 (×2): 1 g via INTRAVENOUS
  Filled 2011-12-14 (×3): qty 10

## 2011-12-14 MED ORDER — ACETAMINOPHEN 325 MG PO TABS
ORAL_TABLET | ORAL | Status: AC
  Filled 2011-12-14: qty 3

## 2011-12-14 MED ORDER — POTASSIUM CHLORIDE IN NACL 40-0.9 MEQ/L-% IV SOLN
INTRAVENOUS | Status: AC
  Administered 2011-12-15 (×2): via INTRAVENOUS
  Filled 2011-12-14 (×3): qty 1000

## 2011-12-14 MED ORDER — ONDANSETRON HCL 4 MG/2ML IJ SOLN
4.0000 mg | Freq: Once | INTRAMUSCULAR | Status: AC
  Administered 2011-12-14: 4 mg via INTRAVENOUS
  Filled 2011-12-14: qty 2

## 2011-12-14 MED ORDER — SODIUM CHLORIDE 0.9 % IV BOLUS (SEPSIS)
1000.0000 mL | Freq: Once | INTRAVENOUS | Status: AC
  Administered 2011-12-14: 1000 mL via INTRAVENOUS

## 2011-12-14 MED ORDER — OXYCODONE HCL 5 MG PO TABS
5.0000 mg | ORAL_TABLET | ORAL | Status: DC | PRN
  Administered 2011-12-14 – 2011-12-15 (×2): 5 mg via ORAL
  Filled 2011-12-14 (×2): qty 1

## 2011-12-14 MED ORDER — ACETAMINOPHEN 325 MG PO TABS: 650.0000 mg | ORAL_TABLET | Freq: Four times a day (QID) | ORAL | Status: DC | PRN

## 2011-12-14 MED ORDER — ALBUTEROL SULFATE HFA 108 (90 BASE) MCG/ACT IN AERS
2.0000 | INHALATION_SPRAY | Freq: Four times a day (QID) | RESPIRATORY_TRACT | Status: DC | PRN
  Filled 2011-12-14: qty 6.7

## 2011-12-14 MED ORDER — ACETAMINOPHEN 650 MG RE SUPP: 650.0000 mg | Freq: Four times a day (QID) | RECTAL | Status: DC | PRN

## 2011-12-14 MED ORDER — POTASSIUM CHLORIDE CRYS ER 20 MEQ PO TBCR
40.0000 meq | EXTENDED_RELEASE_TABLET | ORAL | Status: AC
  Administered 2011-12-15 (×2): 40 meq via ORAL
  Filled 2011-12-14 (×2): qty 2

## 2011-12-14 MED ORDER — HEPARIN SODIUM (PORCINE) 5000 UNIT/ML IJ SOLN
5000.0000 [IU] | Freq: Three times a day (TID) | INTRAMUSCULAR | Status: DC
  Filled 2011-12-14 (×8): qty 1

## 2011-12-14 NOTE — ED Provider Notes (Signed)
History     CSN: [id number]  Arrival date & time 12/14/11  1426   First MD Initiated Contact with Patient 12/14/11 1503      Chief Complaint  Patient presents with  . Fever  . Generalized Body Aches    (Consider location/radiation/quality/duration/timing/severity/associated sxs/prior treatment) HPI Patient presents emergency department with symptoms that she states are consistent for when she has a urinary tract infection.  Patient is a quadriplegic, who, states, that she has had multiple urinary tract infections in the past.  Patient, states she has had fever, bodyaches, chills, and weakness.  Patient is chest pain, shortness of breath, dizziness, visual changes, or abdominal pain.  Patient denies take anything prior to arrival for her symptoms.  She states she did not call her primary care Dr. about her current symptoms. Past Medical History  Diagnosis Date  . Renal insufficiency   . Quadriplegia following spinal cord injury     Past Surgical History  Procedure Date  . Tubal ligation     History reviewed. No pertinent family history.  History  Substance Use Topics  . Smoking status: Never Smoker   . Smokeless tobacco: Never Used  . Alcohol Use: Yes    OB History    Grav Para Term Preterm Abortions TAB SAB Ect Mult Living                  Review of Systems All other systems negative except as documented in the HPI. All pertinent positives and negatives as reviewed in the HPI.  Allergies  Review of patient's allergies indicates no known allergies.  Home Medications   Current Outpatient Rx  Name Route Sig Dispense Refill  . ALBUTEROL SULFATE HFA 108 (90 BASE) MCG/ACT IN AERS Inhalation Inhale 2 puffs into the lungs every 6 (six) hours as needed for wheezing. 1 Inhaler 2  . FLUTICASONE-SALMETEROL 100-50 MCG/DOSE IN AEPB Inhalation Inhale 1 puff into the lungs 2 (two) times daily as needed. For wheezing      BP 105/59  Temp 98.5 F (36.9 C) (Oral)  Resp 22   SpO2 99%  Physical Exam  Nursing note and vitals reviewed. Constitutional: She is oriented to person, place, and time. She appears well-developed and well-nourished.  HENT:  Head: Normocephalic and atraumatic.  Mouth/Throat: Mucous membranes are dry.  Eyes: Pupils are equal, round, and reactive to light.  Neck: Normal range of motion. Neck supple.  Cardiovascular: Regular rhythm and normal heart sounds.  Tachycardia present.   Pulmonary/Chest: Effort normal and breath sounds normal.  Abdominal: Soft. Bowel sounds are normal. She exhibits no distension. There is no tenderness.  Neurological: She is alert and oriented to person, place, and time.  Skin: Skin is warm and dry. No rash noted.    ED Course  Procedures (including critical care time)  Labs Reviewed  CBC - Abnormal; Notable for the following:    WBC 14.2 (*)     Hemoglobin 9.4 (*)     HCT 29.5 (*)     MCV 70.9 (*)     MCH 22.6 (*)     RDW 19.5 (*)     All other components within normal limits  DIFFERENTIAL - Abnormal; Notable for the following:    Neutro Abs 10.8 (*)     Monocytes Absolute 1.5 (*)     All other components within normal limits  COMPREHENSIVE METABOLIC PANEL - Abnormal; Notable for the following:    Potassium 3.2 (*)     Glucose, Bld  109 (*)     Albumin 3.2 (*)     AST 40 (*)     Total Bilirubin 0.2 (*)     GFR calc non Af Amer 87 (*)     All other components within normal limits  URINALYSIS, ROUTINE W REFLEX MICROSCOPIC - Abnormal; Notable for the following:    Color, Urine RED (*)  BIOCHEMICALS MAY BE AFFECTED BY COLOR   APPearance TURBID (*)     Hgb urine dipstick SMALL (*)     Bilirubin Urine LARGE (*)     Ketones, ur 40 (*)     Protein, ur >300 (*)     Urobilinogen, UA 4.0 (*)     Nitrite POSITIVE (*)     Leukocytes, UA LARGE (*)     All other components within normal limits  URINE MICROSCOPIC-ADD ON - Abnormal; Notable for the following:    Bacteria, UA MANY (*)     All other  components within normal limits  LACTIC ACID, PLASMA  URINE CULTURE  CULTURE, BLOOD (ROUTINE X 2)  CULTURE, BLOOD (ROUTINE X 2)   Patient, was aggressively managed, when she arrived in the emergency department in her low blood pressure and temperature.  She was given multiple rounds of IV saline boluses and she is responded nicely with her blood pressure last reading 105 systolically.  Patient has been alert and oriented the entire time, while she has been here in the emergency department.  The patient will need to be admitted to the hospital for urinary tract infection and significant dehydration and potential early sepsis even though she is not have a lactic acidosis.  Patient is given IV Rocephin for broad-spectrum coverage.  6:40 PM placed call to the family practice service, and awaiting a return call.  6:49 PM spoke with the family practice resident.  They will be down to see the patient for admission MDM  CRITICAL CARE Performed by: Carlyle Dolly   Total critical care time:45 Critical care time was exclusive of separately billable procedures and treating other patients.  Critical care was necessary to treat or prevent imminent or life-threatening deterioration.  Critical care was time spent personally by me on the following activities: development of treatment plan with patient and/or surrogate as well as nursing, discussions with consultants, evaluation of patient's response to treatment, examination of patient, obtaining history from patient or surrogate, ordering and performing treatments and interventions, ordering and review of laboratory studies, ordering and review of radiographic studies, pulse oximetry and re-evaluation of patient's condition.         Carlyle Dolly, PA-C 12/14/11 1853
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
## 2011-12-14 NOTE — ED Notes (Signed)
Patient states that she started feeling bad on Saturday.  Pt has generalized weakness and body aches.  Pt states that when she has these symptoms it's usually a UTI.  Patient is wheel chair bound.

## 2011-12-15 DIAGNOSIS — N10 Acute tubulo-interstitial nephritis: Secondary | ICD-10-CM

## 2011-12-15 DIAGNOSIS — G8250 Quadriplegia, unspecified: Secondary | ICD-10-CM

## 2011-12-15 LAB — BASIC METABOLIC PANEL
GFR calc Af Amer: >90 mL/min (ref >90)
GFR calc non Af Amer: >90 mL/min (ref >90)
Potassium: 3.8 mEq/L (ref 3.5–5.1)
Sodium: 134 mEq/L — ABNORMAL LOW (ref 135–145)

## 2011-12-15 LAB — CBC
Hemoglobin: 7.9 g/dL — ABNORMAL LOW (ref 12.0–15.0)
MCHC: 32.6 g/dL (ref 30.0–36.0)
RBC: 3.37 MIL/uL — ABNORMAL LOW (ref 3.87–5.11)

## 2011-12-15 MED ORDER — CEPHALEXIN 500 MG PO CAPS: 500.0000 mg | ORAL_CAPSULE | Freq: Three times a day (TID) | ORAL | Status: AC

## 2011-12-15 NOTE — Progress Notes (Signed)
I interviewed and examined this patient and discussed the care plan with Dr. Durene Cal and the [facility] team and agree with assessment and plan as documented in the progress note for today. I reviewed with her the reasons why our medical advice is that she stay for iv antibiotic treatment until the organism and sensitivities are determined.     Tracey Hall A. Sheffield Slider, MD Family Medicine Teaching Service Attending  12/15/2011 5:26 PM
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
## 2011-12-15 NOTE — H&P (Signed)
I interviewed and examined this patient and discussed the care plan with Dr. Armen Pickup and the [facility] team and agree with assessment and plan as documented in the admission note for today.    Rafi Kenneth A. Sheffield Slider, MD Family Medicine Teaching Service Attending  12/15/2011 5:28 PM
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
## 2011-12-15 NOTE — ED Provider Notes (Signed)
Medical screening examination/treatment/procedure(s) were performed by non-physician practitioner and as supervising physician I was immediately available for consultation/collaboration.   Nat Christen, MD 12/15/11 [phone]

## 2011-12-15 NOTE — Progress Notes (Signed)
UR COMPLETED  

## 2011-12-16 DIAGNOSIS — G8250 Quadriplegia, unspecified: Secondary | ICD-10-CM | POA: Diagnosis not present

## 2011-12-16 DIAGNOSIS — N10 Acute tubulo-interstitial nephritis: Secondary | ICD-10-CM | POA: Diagnosis not present

## 2011-12-16 LAB — COMPREHENSIVE METABOLIC PANEL
ALT: 22 U/L (ref 0–35)
AST: 37 U/L (ref 0–37)
Alkaline Phosphatase: 65 U/L (ref 39–117)
CO2: 25 mEq/L (ref 19–32)
Chloride: 101 mEq/L (ref 96–112)
Creatinine, Ser: 0.53 mg/dL (ref 0.50–1.10)
GFR calc non Af Amer: >90 mL/min (ref >90)
Potassium: 4.2 mEq/L (ref 3.5–5.1)
Total Bilirubin: 0.1 mg/dL — ABNORMAL LOW (ref 0.3–1.2)

## 2011-12-16 LAB — URINE CULTURE
Colony Count: >=100000
Culture  Setup Time: 201306241658

## 2011-12-16 LAB — CBC
HCT: 25.7 % — ABNORMAL LOW (ref 36.0–46.0)
MCV: 71.2 fL — ABNORMAL LOW (ref 78.0–100.0)
Platelets: 340 10*3/uL (ref 150–400)
RBC: 3.61 MIL/uL — ABNORMAL LOW (ref 3.87–5.11)
WBC: 7.7 10*3/uL (ref 4.0–10.5)

## 2011-12-16 NOTE — Discharge Summary (Signed)
I interviewed and examined this patient and discussed the care plan with Dr. Durene Cal and the [facility] team and agree with assessment and plan as documented in the discharge  note for today.    Tracey Hall A. Sheffield Slider, MD Family Medicine Teaching Service Attending  12/16/2011 4:18 PM

## 2011-12-16 NOTE — Care Management Note (Signed)
    Page 1 of 1   12/16/2011     1:57:02 PM   CARE MANAGEMENT NOTE 12/16/2011  Patient:  Tracey Hall, Tracey Hall   Account Number:  [id number]  Date Initiated:  12/16/2011  Documentation initiated by:  Anette Guarneri  Subjective/Objective Assessment:   Pyelonephritis  Quadraplegic  Lives with husband     Action/Plan:   Needs self cath   Anticipated DC Date:  12/16/2011   Anticipated DC Plan:  HOME/SELF CARE      DC Planning Services  CM consult      Choice offered to / List presented to:             Status of service:  Completed, signed off Medicare Important Message given?   (If response is "NO", the following Medicare IM given date fields will be blank) Date Medicare IM given:   Date Additional Medicare IM given:    Discharge Disposition:  HOME/SELF CARE  Per UR Regulation:  Reviewed for med. necessity/level of care/duration of stay  If discussed at Long Length of Stay Meetings, dates discussed:    Comments:  12/16/11  13:49 Anette Guarneri RN/CM Spoke with patient regarding d/c needs, per patient she has had to in and out cath in past and due to quad. deficits her spouse has been cathing her and she does not feel HHRN necessary, per patient she has some supplies but they are 30-35 years old. Gave patient name, phone# and address of [facility] $29/30, patient will pay for supplies when she picks them up. Patient understands to obtain betadine, cotton balls and box of sterile gloves. Patient will follow up with perfered Mail order provider for additional supplies.

## 2011-12-21 LAB — CULTURE, BLOOD (ROUTINE X 2)
Culture: NO GROWTH
Culture: NO GROWTH

## 2011-12-23 ENCOUNTER — Ambulatory Visit: Payer: Medicare Other | Admitting: Family Medicine

## 2012-01-22 ENCOUNTER — Other Ambulatory Visit: Payer: Self-pay | Admitting: Family Medicine

## 2012-01-22 MED ORDER — "DISPOSABLE UNDERPADS 30""X36"" MISC": 1.0000 | Status: DC | PRN

## 2012-02-09 ENCOUNTER — Telehealth: Payer: Self-pay | Admitting: Family Medicine

## 2012-02-09 NOTE — Telephone Encounter (Signed)
Patient is calling because she is having excessive sweating and she wants to know what she can do for it.

## 2012-02-09 NOTE — Telephone Encounter (Signed)
Patient reports excessive sweating for 1.5 months. Appointment scheduled for 08/22 .

## 2012-02-11 ENCOUNTER — Encounter: Payer: Self-pay | Admitting: Family Medicine

## 2012-02-11 ENCOUNTER — Ambulatory Visit (INDEPENDENT_AMBULATORY_CARE_PROVIDER_SITE_OTHER): Payer: Medicare Other | Admitting: Family Medicine

## 2012-02-11 VITALS — BP 91/62 | HR 84 | Temp 98.2°F

## 2012-02-11 DIAGNOSIS — N390 Urinary tract infection, site not specified: Secondary | ICD-10-CM

## 2012-02-11 DIAGNOSIS — R61 Generalized hyperhidrosis: Secondary | ICD-10-CM

## 2012-02-11 DIAGNOSIS — L74519 Primary focal hyperhidrosis, unspecified: Secondary | ICD-10-CM | POA: Diagnosis not present

## 2012-02-11 DIAGNOSIS — G8250 Quadriplegia, unspecified: Secondary | ICD-10-CM

## 2012-02-11 LAB — POCT UA - MICROSCOPIC ONLY

## 2012-02-11 LAB — POCT URINALYSIS DIPSTICK
Bilirubin, UA: NEGATIVE
Ketones, UA: NEGATIVE
pH, UA: 7

## 2012-02-11 MED ORDER — ALUMINUM CHLORIDE 20 % EX SOLN: Freq: Two times a day (BID) | CUTANEOUS | Status: DC
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
## 2012-02-11 NOTE — Patient Instructions (Addendum)
Use the Drysol to help with the sweating. We will let you know the results of the urine test.   I will contact Dr. Armen Pickup for weight ideas  It was good to meet you today

## 2012-02-12 ENCOUNTER — Telehealth: Payer: Self-pay | Admitting: Family Medicine

## 2012-02-12 DIAGNOSIS — R61 Generalized hyperhidrosis: Secondary | ICD-10-CM | POA: Insufficient documentation

## 2012-02-12 MED ORDER — CEPHALEXIN 500 MG PO CAPS: 500.0000 mg | ORAL_CAPSULE | Freq: Four times a day (QID) | ORAL | Status: DC

## 2012-02-12 NOTE — Telephone Encounter (Signed)
Called to relay information regarding urine specimen.  Many white blood cells, much blood.  Wanted to see if patient had traumatic cath, i.e. If she noticed any blood when performing the cath.  I have sent in an antibiotic (Keflex 500 mg po bid) for her that she will need to take for 7 days.  She will need a follow up UA in about 7 - 10 days to make sure everything's clear.  We will also await urine culture in case we have to change antibiotic.

## 2012-02-12 NOTE — Telephone Encounter (Signed)
Gave pt information regarding her UA. And told her that Dr. Gwendolyn Grant has called in an Rx to [facility] and that the results of the culture have not come back and depending on that Dr. Gwendolyn Grant may change the type of Abx. She was told that she will need to come back in 7-10 days to recheck. appt made with Dr. Armen Pickup for 8.30.2013 @ 130 PM Pt voiced understanding and agreed.[name] Tracey Hall

## 2012-02-12 NOTE — Assessment & Plan Note (Signed)
Patient concerned of that she is gaining weight because she is not exercising.  Would like to know what to do to help with weight.  Discussed diet options, patient exasperated and states "I'm already doing that."  She is concerned that caretakers will not be able to lift her. Told her I would forward note to PCP for more thoughts since PCP knows her well.

## 2012-02-12 NOTE — Assessment & Plan Note (Signed)
I do note history of recurrent UTI's and history of pyelonephritis.   Cath UA here in clinic.  Will call patient with results. Drysol for relief, likely UTI, though has been complaining of this for past 1 month or so. To stop Drysol if UTI comes back positive.   Autonomic dysfunction is also a possibility.

## 2012-02-19 ENCOUNTER — Ambulatory Visit (INDEPENDENT_AMBULATORY_CARE_PROVIDER_SITE_OTHER): Payer: Medicare Other | Admitting: Family Medicine

## 2012-02-19 VITALS — BP 136/86 | HR 72 | Temp 98.1°F | Wt 197.8 lb

## 2012-02-19 DIAGNOSIS — R61 Generalized hyperhidrosis: Secondary | ICD-10-CM

## 2012-02-19 DIAGNOSIS — L74519 Primary focal hyperhidrosis, unspecified: Secondary | ICD-10-CM | POA: Diagnosis not present

## 2012-02-19 DIAGNOSIS — N9489 Other specified conditions associated with female genital organs and menstrual cycle: Secondary | ICD-10-CM

## 2012-02-19 DIAGNOSIS — R7309 Other abnormal glucose: Secondary | ICD-10-CM

## 2012-02-19 DIAGNOSIS — R300 Dysuria: Secondary | ICD-10-CM

## 2012-02-19 DIAGNOSIS — J45909 Unspecified asthma, uncomplicated: Secondary | ICD-10-CM | POA: Diagnosis not present

## 2012-02-19 DIAGNOSIS — N390 Urinary tract infection, site not specified: Secondary | ICD-10-CM

## 2012-02-19 DIAGNOSIS — R739 Hyperglycemia, unspecified: Secondary | ICD-10-CM

## 2012-02-19 DIAGNOSIS — R059 Cough, unspecified: Secondary | ICD-10-CM

## 2012-02-19 DIAGNOSIS — R05 Cough: Secondary | ICD-10-CM

## 2012-02-19 DIAGNOSIS — R635 Abnormal weight gain: Secondary | ICD-10-CM | POA: Insufficient documentation

## 2012-02-19 DIAGNOSIS — N949 Unspecified condition associated with female genital organs and menstrual cycle: Secondary | ICD-10-CM

## 2012-02-19 DIAGNOSIS — E669 Obesity, unspecified: Secondary | ICD-10-CM | POA: Diagnosis not present

## 2012-02-19 LAB — POCT URINALYSIS DIPSTICK
Protein, UA: >=300
Urobilinogen, UA: 1

## 2012-02-19 LAB — POCT UA - MICROSCOPIC ONLY

## 2012-02-19 LAB — TSH: TSH: 2.315 u[IU]/mL (ref 0.350–4.500)

## 2012-02-19 MED ORDER — LORATADINE 10 MG PO TABS: 10.0000 mg | ORAL_TABLET | Freq: Every day | ORAL | Status: DC

## 2012-02-19 NOTE — Patient Instructions (Addendum)
Ms. Dunkerson,  Thank you for coming back today. We have collected your urine and will send for culture and sensitivities. If needed we will retreat you with antibiotics.   For now: Labs: A1c and TSH to check on sweating. Claritin once daily for chronic cough.   I will research causes of sweating.   I will call with results and what I find.   Dr. Armen Pickup
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
## 2012-02-21 ENCOUNTER — Telehealth: Payer: Self-pay | Admitting: Family Medicine

## 2012-02-21 ENCOUNTER — Encounter: Payer: Self-pay | Admitting: Family Medicine

## 2012-02-21 NOTE — Assessment & Plan Note (Addendum)
A: patient completed course of keflex for presumed UTI. UA still suggestive of UTI. F/u UCx pending. Cath urine specimen.  Plan to treat if ucx grows out > 100,000 colonies of a single organism.

## 2012-02-21 NOTE — Assessment & Plan Note (Signed)
A: suspect autonomic dysfunction. Trigger possible UTI, must also consider bladder distension, fecal impaction, pelvic infection, DVT and pressure ulcer.  P: -f/u urine culture -touch bases with patient about any other signs and symptoms that suggest early autonomic dysfunction.

## 2012-02-21 NOTE — Assessment & Plan Note (Signed)
A: patient admits to poor diet. Little intake in order to prevent weight gain. TSH and A1c normal. P: -offered dietician consult-patient not interested at this time.  -recommended 3 small meals daily and upper body exercise.

## 2012-02-21 NOTE — Telephone Encounter (Signed)
Called patient left VM.   Looking up causes of hyperhidrosis. Possible cause is idiopathic, I am concerned about autonomic dysreflexia given history of spinal cord injury at the level of C-spine. Called patient to ask about associated signs of symptoms. Informed patient that I will try back tomorrow AM or Tuesday AM.   What can trigger autonomic dysreflexia?  Some of the stimuli that may trigger autonomic dysreflexia are as follows:  distended bladder or rectum (too full)  pelvic, rectal, or urologic exam  uterine contractions, especially during labor and delivery  urinary tract infection  pelvic infection  pressure ulcer  clots in your leg veins   What are symptoms of autonomic dysreflexia?  During autonomic dysreflexia, you may experience some or all of the following symptoms, depending on the level of spinal cord injury and the severity of the trigger:  very high blood pressure - the systolic (top number) may be greater than 300  slow heart rate  blurred vision  throbbing headache  sweating  flushing  erection of body hair  nasal congestion  increased spasticity  nausea

## 2012-02-23 LAB — URINE CULTURE: Colony Count: 50000

## 2012-02-29 ENCOUNTER — Telehealth: Payer: Self-pay | Admitting: Family Medicine

## 2012-02-29 ENCOUNTER — Encounter: Payer: Self-pay | Admitting: *Deleted

## 2012-02-29 NOTE — Telephone Encounter (Signed)
Pt is calling back about questions that Dr Armen Pickup and if she has test results that would be great  Yes, she does get headaches.

## 2012-03-03 NOTE — Telephone Encounter (Signed)
Called patient back to review labs and ask questions about triggers for autonomic dysfunction. All questions negative. Patient stools every 1-1.5 weeks and this is normal for her. She vaginal discharge to suggest PID. No pressure ulcers. No unilateral LE edema to suggest DVT.   Plan: F/u appt with me for focused exam.  I will discuss weight loss strategies with Tracey Hall.

## 2012-03-17 ENCOUNTER — Other Ambulatory Visit: Payer: Self-pay | Admitting: Family Medicine

## 2012-03-17 NOTE — Telephone Encounter (Signed)
Patient is calling for a refill on her muscle spasm medication of which she did not remember the name of.  [facility] [address].

## 2012-03-21 MED ORDER — CYCLOBENZAPRINE HCL 10 MG PO TABS: 10.0000 mg | ORAL_TABLET | Freq: Two times a day (BID) | ORAL | Status: DC | PRN

## 2012-03-21 NOTE — Telephone Encounter (Signed)
Sent in flexeril 

## 2012-07-09 ENCOUNTER — Encounter (HOSPITAL_COMMUNITY): Payer: Self-pay | Admitting: *Deleted

## 2012-07-09 ENCOUNTER — Emergency Department (HOSPITAL_COMMUNITY): Payer: Medicare Other

## 2012-07-09 ENCOUNTER — Inpatient Hospital Stay (HOSPITAL_COMMUNITY)
Admission: EM | Admit: 2012-07-09 | Discharge: 2012-07-12 | DRG: 689 | Disposition: A | Discharge status: Home / Self Care | Payer: Medicare Other | Attending: Family Medicine | Admitting: Family Medicine

## 2012-07-09 DIAGNOSIS — N289 Disorder of kidney and ureter, unspecified: Secondary | ICD-10-CM | POA: Diagnosis present

## 2012-07-09 DIAGNOSIS — R059 Cough, unspecified: Secondary | ICD-10-CM | POA: Diagnosis not present

## 2012-07-09 DIAGNOSIS — R310 Gross hematuria: Secondary | ICD-10-CM | POA: Diagnosis present

## 2012-07-09 DIAGNOSIS — B964 Proteus (mirabilis) (morganii) as the cause of diseases classified elsewhere: Secondary | ICD-10-CM | POA: Diagnosis present

## 2012-07-09 DIAGNOSIS — G8250 Quadriplegia, unspecified: Secondary | ICD-10-CM | POA: Diagnosis not present

## 2012-07-09 DIAGNOSIS — IMO0002 Reserved for concepts with insufficient information to code with codable children: Secondary | ICD-10-CM

## 2012-07-09 DIAGNOSIS — N10 Acute tubulo-interstitial nephritis: Principal | ICD-10-CM | POA: Diagnosis present

## 2012-07-09 DIAGNOSIS — E669 Obesity, unspecified: Secondary | ICD-10-CM | POA: Diagnosis present

## 2012-07-09 DIAGNOSIS — N390 Urinary tract infection, site not specified: Secondary | ICD-10-CM | POA: Diagnosis not present

## 2012-07-09 DIAGNOSIS — R05 Cough: Secondary | ICD-10-CM | POA: Diagnosis not present

## 2012-07-09 DIAGNOSIS — R51 Headache: Secondary | ICD-10-CM | POA: Diagnosis present

## 2012-07-09 DIAGNOSIS — J101 Influenza due to other identified influenza virus with other respiratory manifestations: Secondary | ICD-10-CM | POA: Diagnosis present

## 2012-07-09 DIAGNOSIS — D509 Iron deficiency anemia, unspecified: Secondary | ICD-10-CM | POA: Diagnosis present

## 2012-07-09 DIAGNOSIS — N319 Neuromuscular dysfunction of bladder, unspecified: Secondary | ICD-10-CM | POA: Diagnosis present

## 2012-07-09 DIAGNOSIS — E876 Hypokalemia: Secondary | ICD-10-CM | POA: Diagnosis present

## 2012-07-09 DIAGNOSIS — J111 Influenza due to unidentified influenza virus with other respiratory manifestations: Secondary | ICD-10-CM | POA: Diagnosis not present

## 2012-07-09 LAB — CBC WITH DIFFERENTIAL/PLATELET
Basophils Absolute: 0 10*3/uL (ref 0.0–0.1)
Basophils Relative: 0 % (ref 0–1)
Eosinophils Absolute: 0 10*3/uL (ref 0.0–0.7)
Eosinophils Relative: 0 % (ref 0–5)
Lymphocytes Relative: 8 % — ABNORMAL LOW (ref 12–46)
MCHC: 31.2 g/dL (ref 30.0–36.0)
MCV: 72.4 fL — ABNORMAL LOW (ref 78.0–100.0)
Platelets: 340 10*3/uL (ref 150–400)
RDW: 19.8 % — ABNORMAL HIGH (ref 11.5–15.5)
WBC: 5.9 10*3/uL (ref 4.0–10.5)

## 2012-07-09 LAB — URINALYSIS, ROUTINE W REFLEX MICROSCOPIC
Ketones, ur: 15 mg/dL — AB
Protein, ur: 100 mg/dL — AB
Urobilinogen, UA: 1 mg/dL (ref 0.0–1.0)

## 2012-07-09 LAB — URINE MICROSCOPIC-ADD ON

## 2012-07-09 LAB — BASIC METABOLIC PANEL
CO2: 23 mEq/L (ref 19–32)
Calcium: 8.7 mg/dL (ref 8.4–10.5)
Creatinine, Ser: 0.58 mg/dL (ref 0.50–1.10)
GFR calc Af Amer: >90 mL/min (ref >90)
GFR calc non Af Amer: >90 mL/min (ref >90)
Sodium: 138 mEq/L (ref 135–145)

## 2012-07-09 MED ORDER — POTASSIUM CHLORIDE CRYS ER 20 MEQ PO TBCR
20.0000 meq | EXTENDED_RELEASE_TABLET | Freq: Two times a day (BID) | ORAL | Status: DC
  Administered 2012-07-09: 20 meq via ORAL
  Filled 2012-07-09 (×3): qty 1

## 2012-07-09 MED ORDER — SODIUM CHLORIDE 0.9 % IV BOLUS (SEPSIS): 1000.0000 mL | Freq: Once | INTRAVENOUS | Status: DC

## 2012-07-09 MED ORDER — MORPHINE SULFATE 4 MG/ML IJ SOLN
2.0000 mg | Freq: Once | INTRAMUSCULAR | Status: AC
  Administered 2012-07-09: 2 mg via INTRAVENOUS
  Filled 2012-07-09: qty 1

## 2012-07-09 MED ORDER — DEXTROSE 5 % IV SOLN
1.0000 g | INTRAVENOUS | Status: DC
  Administered 2012-07-09: 1 g via INTRAVENOUS
  Filled 2012-07-09: qty 10

## 2012-07-09 MED ORDER — SODIUM CHLORIDE 0.9 % IV SOLN
INTRAVENOUS | Status: DC
  Administered 2012-07-09 (×2): via INTRAVENOUS

## 2012-07-09 MED ORDER — IBUPROFEN 800 MG PO TABS
800.0000 mg | ORAL_TABLET | ORAL | Status: AC
  Administered 2012-07-09: 800 mg via ORAL
  Filled 2012-07-09: qty 1

## 2012-07-09 MED ORDER — SODIUM CHLORIDE 0.9 % IV SOLN
INTRAVENOUS | Status: DC
  Administered 2012-07-09: 18:00:00 via INTRAVENOUS

## 2012-07-09 MED ORDER — POTASSIUM CHLORIDE 20 MEQ PO PACK: 20.0000 meq | PACK | Freq: Two times a day (BID) | ORAL | Status: DC

## 2012-07-09 MED ORDER — ACETAMINOPHEN 325 MG PO TABS: 650.0000 mg | ORAL_TABLET | Freq: Four times a day (QID) | ORAL | Status: DC | PRN

## 2012-07-09 MED ORDER — SODIUM CHLORIDE 0.9 % IV BOLUS (SEPSIS)
500.0000 mL | Freq: Once | INTRAVENOUS | Status: AC
  Administered 2012-07-09: 500 mL via INTRAVENOUS

## 2012-07-09 MED ORDER — HEPARIN SODIUM (PORCINE) 5000 UNIT/ML IJ SOLN
5000.0000 [IU] | Freq: Three times a day (TID) | INTRAMUSCULAR | Status: DC
  Filled 2012-07-09 (×11): qty 1

## 2012-07-09 MED ORDER — ONDANSETRON HCL 4 MG PO TABS
4.0000 mg | ORAL_TABLET | Freq: Three times a day (TID) | ORAL | Status: DC | PRN
  Administered 2012-07-09 – 2012-07-10 (×2): 4 mg via ORAL
  Filled 2012-07-09 (×2): qty 1

## 2012-07-09 NOTE — ED Notes (Signed)
Pt reports that she feels like she has a UTI, pt is quad and wears depends due to bladder spasms. Reports since last night she was had headache, bodyaches, and fever. States this is how she usually feels when she has UTI.
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
## 2012-07-09 NOTE — ED Notes (Signed)
In and out cath performed. Urine cloudy and pus noted. Also strong odor noted. Pt tolerated well.

## 2012-07-10 ENCOUNTER — Encounter (HOSPITAL_COMMUNITY): Payer: Self-pay | Admitting: *Deleted

## 2012-07-10 DIAGNOSIS — J111 Influenza due to unidentified influenza virus with other respiratory manifestations: Secondary | ICD-10-CM | POA: Diagnosis not present

## 2012-07-10 DIAGNOSIS — N390 Urinary tract infection, site not specified: Secondary | ICD-10-CM | POA: Diagnosis not present

## 2012-07-10 DIAGNOSIS — G8250 Quadriplegia, unspecified: Secondary | ICD-10-CM | POA: Diagnosis not present

## 2012-07-10 LAB — INFLUENZA PANEL BY PCR (TYPE A & B)
Influenza A By PCR: POSITIVE — AB
Influenza B By PCR: NEGATIVE

## 2012-07-10 LAB — URINALYSIS, ROUTINE W REFLEX MICROSCOPIC
Glucose, UA: NEGATIVE mg/dL
Ketones, ur: NEGATIVE mg/dL
Nitrite: NEGATIVE
Specific Gravity, Urine: 1.016 (ref 1.005–1.030)
pH: 6 (ref 5.0–8.0)

## 2012-07-10 LAB — URINE MICROSCOPIC-ADD ON

## 2012-07-10 LAB — PROTEIN / CREATININE RATIO, URINE
Protein Creatinine Ratio: 1.65 — ABNORMAL HIGH (ref 0.00–0.15)
Total Protein, Urine: 92.5 mg/dL

## 2012-07-10 LAB — IRON AND TIBC: Iron: <10 ug/dL — ABNORMAL LOW (ref 42–135)

## 2012-07-10 MED ORDER — KETOROLAC TROMETHAMINE 60 MG/2ML IM SOLN
30.0000 mg | Freq: Four times a day (QID) | INTRAMUSCULAR | Status: AC
  Administered 2012-07-10 – 2012-07-11 (×3): 30 mg via INTRAMUSCULAR
  Filled 2012-07-10 (×4): qty 2

## 2012-07-10 MED ORDER — PIPERACILLIN-TAZOBACTAM 3.375 G IVPB
3.3750 g | Freq: Three times a day (TID) | INTRAVENOUS | Status: DC
  Administered 2012-07-10 – 2012-07-12 (×6): 3.375 g via INTRAVENOUS
  Filled 2012-07-10 (×8): qty 50

## 2012-07-10 MED ORDER — SODIUM CHLORIDE 0.9 % IV BOLUS (SEPSIS)
500.0000 mL | Freq: Once | INTRAVENOUS | Status: AC
  Administered 2012-07-10: 500 mL via INTRAVENOUS

## 2012-07-10 MED ORDER — POTASSIUM CHLORIDE IN NACL 20-0.9 MEQ/L-% IV SOLN
INTRAVENOUS | Status: DC
  Administered 2012-07-10 – 2012-07-11 (×2): 125 mL/h via INTRAVENOUS
  Filled 2012-07-10 (×9): qty 1000

## 2012-07-10 MED ORDER — OSELTAMIVIR PHOSPHATE 75 MG PO CAPS
75.0000 mg | ORAL_CAPSULE | Freq: Two times a day (BID) | ORAL | Status: DC
  Administered 2012-07-10 – 2012-07-12 (×5): 75 mg via ORAL
  Filled 2012-07-10 (×6): qty 1

## 2012-07-10 MED ORDER — PIPERACILLIN-TAZOBACTAM 3.375 G IVPB 30 MIN
3.3750 g | Freq: Once | INTRAVENOUS | Status: AC
  Administered 2012-07-10: 3.375 g via INTRAVENOUS
  Filled 2012-07-10: qty 50

## 2012-07-10 MED ORDER — POTASSIUM CHLORIDE CRYS ER 20 MEQ PO TBCR
40.0000 meq | EXTENDED_RELEASE_TABLET | Freq: Two times a day (BID) | ORAL | Status: DC
  Administered 2012-07-10 – 2012-07-12 (×5): 40 meq via ORAL
  Filled 2012-07-10 (×5): qty 2

## 2012-07-10 MED ORDER — OSELTAMIVIR PHOSPHATE 75 MG PO CAPS: 75.0000 mg | ORAL_CAPSULE | Freq: Every day | ORAL | Status: DC

## 2012-07-10 MED ORDER — ALBUTEROL SULFATE HFA 108 (90 BASE) MCG/ACT IN AERS
1.0000 | INHALATION_SPRAY | RESPIRATORY_TRACT | Status: DC | PRN
  Administered 2012-07-11: 2 via RESPIRATORY_TRACT
  Filled 2012-07-10 (×2): qty 6.7

## 2012-07-10 MED ORDER — ACETAMINOPHEN 325 MG PO TABS
650.0000 mg | ORAL_TABLET | Freq: Four times a day (QID) | ORAL | Status: AC
  Administered 2012-07-10 – 2012-07-11 (×8): 650 mg via ORAL
  Filled 2012-07-10 (×8): qty 2

## 2012-07-10 MED ORDER — SODIUM CHLORIDE 0.9 % IV BOLUS (SEPSIS): 1000.0000 mL | Freq: Once | INTRAVENOUS | Status: DC

## 2012-07-10 MED ORDER — TRAMADOL HCL 50 MG PO TABS
50.0000 mg | ORAL_TABLET | Freq: Four times a day (QID) | ORAL | Status: DC | PRN
  Administered 2012-07-10 (×2): 50 mg via ORAL
  Filled 2012-07-10 (×2): qty 1

## 2012-07-10 MED ORDER — POLYETHYLENE GLYCOL 3350 17 G PO PACK
17.0000 g | PACK | Freq: Every day | ORAL | Status: DC
  Administered 2012-07-11: 17 g via ORAL
  Filled 2012-07-10 (×3): qty 1

## 2012-07-10 MED ORDER — DEXTROSE 5 % IV SOLN: 1.0000 g | INTRAVENOUS | Status: DC

## 2012-07-10 MED ORDER — ACETAMINOPHEN 650 MG PO TABS: 1.0000 | ORAL_TABLET | Freq: Four times a day (QID) | ORAL | Status: DC

## 2012-07-10 MED ORDER — ALBUTEROL SULFATE (5 MG/ML) 0.5% IN NEBU: 0.6300 mg | INHALATION_SOLUTION | Freq: Four times a day (QID) | RESPIRATORY_TRACT | Status: DC | PRN

## 2012-07-10 MED ORDER — OXYCODONE HCL 5 MG PO TABS
5.0000 mg | ORAL_TABLET | ORAL | Status: DC | PRN
  Administered 2012-07-10 – 2012-07-11 (×3): 5 mg via ORAL
  Filled 2012-07-10 (×3): qty 1

## 2012-07-10 MED ORDER — OXYCODONE HCL 5 MG PO TABS
5.0000 mg | ORAL_TABLET | Freq: Four times a day (QID) | ORAL | Status: DC | PRN
  Administered 2012-07-10: 5 mg via ORAL
  Filled 2012-07-10: qty 1

## 2012-07-10 NOTE — Progress Notes (Signed)
Pt refuses heparin states not like needles Linward Headland D

## 2012-07-10 NOTE — Progress Notes (Signed)
MD notified regarding patient refusing all lab work at this time due to being a difficult stick.  Patient requesting a central line because that is what she has required in the past.  Will continue to monitor.

## 2012-07-10 NOTE — Progress Notes (Signed)
Family Medicine Teaching Service Daily Progress Note Service Page: [phone]  Subjective:  Feels poorly this am. Continues to report body aches.  Objective: Temp:  [97.9 F (36.6 C)-99.6 F (37.6 C)] 97.9 F (36.6 C) (01/19 2121) Pulse Rate:  [79-96] 79  (01/19 2121) Resp:  [18] 18  (01/19 2121) BP: (95-151)/(54-92) 100/54 mmHg (01/19 2121) SpO2:  [96 %-100 %] 100 % (01/19 2121)  Physical Exam: General: resting comfortably in bed this am. NAD. Cardiovascular:  RRR. No murmurs, rubs, or gallops. Respiratory: Normal work of breathing.  CTAB. No rales, rhonchi, or wheezing. Abdomen: obese, soft, nontender, nondistended. Extremities: No edema. Neuro: decreased sensation from the nipple down; unable to move lower extremities.  No additional neurological deficits.  CBC BMET   Lab 07/09/12 1733  WBC 5.9  HGB 8.6*  HCT 27.6*  PLT 340    Lab 07/09/12 1733  NA 138  K 3.2*  CL 102  CO2 23  BUN 16  CREATININE 0.58  GLUCOSE 81  CALCIUM 8.7     Urinalysis    Component Value Date/Time   COLORURINE YELLOW 07/10/2012 1854   APPEARANCEUR CLOUDY* 07/10/2012 1854   LABSPEC 1.016 07/10/2012 1854   PHURINE 6.0 07/10/2012 1854   GLUCOSEU NEGATIVE 07/10/2012 1854   HGBUR MODERATE* 07/10/2012 1854   HGBUR moderate 01/10/2010 1430   BILIRUBINUR NEGATIVE 07/10/2012 1854   BILIRUBINUR NEG 02/19/2012 1440   KETONESUR NEGATIVE 07/10/2012 1854   PROTEINUR 100* 07/10/2012 1854   UROBILINOGEN 1.0 07/10/2012 1854   UROBILINOGEN 1.0 02/19/2012 1440   NITRITE NEGATIVE 07/10/2012 1854   NITRITE NEG 02/19/2012 1440   LEUKOCYTESUR LARGE* 07/10/2012 1854   Imaging/Diagnostic Tests:  Urine Cx - pending.  Influenza PCR - + H1N1  Assessment/Plan: 32 year old female with PMH of quadriplegia, neurogenic bladder, and recurrent UTI's who presented with diffuse body aches, chills, and headache.  Urinalysis was obtained and was significant for positive nitrites, large leukocytes, and many bacteria.   Additionally, influenza PCR was obtained and was positive for H1N1.  # Acute complicated pyelonephritis. - Initially started on CTX. Switched to Zosyn on 1/19 given lack of improvement and to broaden coverage. - Will continue Zosyn (Day 2) while awaiting culture results. - Will continue Tylenol, Oxycodone PRN, and Toradol for pain.  # Influenza - Will continue Tamiflu (Day 2) - Will continue IV fluids and encourage PO intake.  # Anemia - Microcytic, Iron <10. - Hemoglobin stable at this time (8.3 this am). - Awaiting ferritin.  If low, will start Iron supplementation.  FEN/GI: NS with 20 mEq KCl @ 125 mL/hr; Regular diet PPx: Heparin SQ Dispo: Pending clinical improvement  Everlene Other, DO 07/10/2012, 10:36 PM

## 2012-07-10 NOTE — Progress Notes (Signed)
I have seen and examined this patient. I have discussed with Dr Madolyn Frieze.  I agree with their findings and plans as documented in their admission note.  Acute Issues 1. Acute complicated pyelonephritis - patient with partial quadriplegia and neurogenic bladder. Pt reportedly self-cathing bladder every eight hours per last discharge summary.  - Complaining of severe myalgias which was part of pyelonephritis previously.  Plan:  Zosyn empiric coverage GNR and enterococcus Analgesics and antipyretics and antiemetics IVF hydration/resuscitation Replete K+

## 2012-07-10 NOTE — H&P (Signed)
I have seen and examined this patient. I have discussed with Oh Park.  I agree with their findings and plans as documented in their progress note.  Acute Issues 1. Influenza, acute 2. Pyelonephritis, acute  Start Tamiflu Continue Zosyn
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
## 2012-07-10 NOTE — Progress Notes (Signed)
32yo female with h/o severe cystitis/pyelonephritis due to traumatic quadraplegia, now admitted for same; last urine Cx in June was positive for Ecoli resistant to Bactrim; to begin Zosyn.  Will begin Zosyn 3.375g IV Q8H for SCr 0.58 and monitor Cx; f/u to change to po ABX if appropriate.  Vernard Gambles, PharmD, BCPS 07/10/2012 6:58 AM

## 2012-07-10 NOTE — Progress Notes (Signed)
Notified facilities regarding patient's fan from home that the patient uses at all times.

## 2012-07-11 DIAGNOSIS — D509 Iron deficiency anemia, unspecified: Secondary | ICD-10-CM | POA: Diagnosis present

## 2012-07-11 DIAGNOSIS — J101 Influenza due to other identified influenza virus with other respiratory manifestations: Secondary | ICD-10-CM | POA: Diagnosis present

## 2012-07-11 DIAGNOSIS — R509 Fever, unspecified: Secondary | ICD-10-CM | POA: Diagnosis not present

## 2012-07-11 DIAGNOSIS — R310 Gross hematuria: Secondary | ICD-10-CM | POA: Diagnosis present

## 2012-07-11 DIAGNOSIS — E669 Obesity, unspecified: Secondary | ICD-10-CM | POA: Diagnosis present

## 2012-07-11 DIAGNOSIS — B964 Proteus (mirabilis) (morganii) as the cause of diseases classified elsewhere: Secondary | ICD-10-CM | POA: Diagnosis present

## 2012-07-11 DIAGNOSIS — R51 Headache: Secondary | ICD-10-CM | POA: Diagnosis present

## 2012-07-11 DIAGNOSIS — N390 Urinary tract infection, site not specified: Secondary | ICD-10-CM | POA: Diagnosis not present

## 2012-07-11 DIAGNOSIS — J111 Influenza due to unidentified influenza virus with other respiratory manifestations: Secondary | ICD-10-CM | POA: Diagnosis not present

## 2012-07-11 DIAGNOSIS — N289 Disorder of kidney and ureter, unspecified: Secondary | ICD-10-CM | POA: Diagnosis present

## 2012-07-11 DIAGNOSIS — IMO0002 Reserved for concepts with insufficient information to code with codable children: Secondary | ICD-10-CM | POA: Diagnosis not present

## 2012-07-11 DIAGNOSIS — N319 Neuromuscular dysfunction of bladder, unspecified: Secondary | ICD-10-CM | POA: Diagnosis present

## 2012-07-11 DIAGNOSIS — N10 Acute tubulo-interstitial nephritis: Secondary | ICD-10-CM | POA: Diagnosis present

## 2012-07-11 DIAGNOSIS — E876 Hypokalemia: Secondary | ICD-10-CM | POA: Diagnosis present

## 2012-07-11 DIAGNOSIS — G8250 Quadriplegia, unspecified: Secondary | ICD-10-CM | POA: Diagnosis not present

## 2012-07-11 LAB — COMPREHENSIVE METABOLIC PANEL
AST: 24 U/L (ref 0–37)
BUN: 4 mg/dL — ABNORMAL LOW (ref 6–23)
CO2: 25 mEq/L (ref 19–32)
Calcium: 8.1 mg/dL — ABNORMAL LOW (ref 8.4–10.5)
Creatinine, Ser: 0.48 mg/dL — ABNORMAL LOW (ref 0.50–1.10)
GFR calc Af Amer: >90 mL/min (ref >90)
GFR calc non Af Amer: >90 mL/min (ref >90)
Glucose, Bld: 81 mg/dL (ref 70–99)

## 2012-07-11 LAB — CBC
Hemoglobin: 8.3 g/dL — ABNORMAL LOW (ref 12.0–15.0)
MCH: 22.3 pg — ABNORMAL LOW (ref 26.0–34.0)
MCHC: 31.3 g/dL (ref 30.0–36.0)

## 2012-07-11 LAB — URINE CULTURE

## 2012-07-11 LAB — TSH: TSH: 3.74 u[IU]/mL (ref 0.350–4.500)

## 2012-07-11 LAB — FERRITIN: Ferritin: 30 ng/mL (ref 10–291)

## 2012-07-11 NOTE — Progress Notes (Signed)
PCP Progress Note  I stopped by to see Tracey Hall. She reports feeling 10 x better but is still having headache, back pain and pelvic pain with each urination. She reports that her pain with urination has been ongoing for the past 2-4 weeks. I appreciate the excellent care provided by the [facility] Medicine Teaching Service. I have take the liberty to order a bladder scan to assess for impaired bladder emptying.  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   Dessa Phi, M.D.

## 2012-07-11 NOTE — Progress Notes (Signed)
I examined this patient and discussed the care plan with Dr Adriana Simas and the [facility] team and agree with assessment and plan as documented in the progress note above. In reviewing her history, it suggests that she had UTI for 2-3 weeks before the influenza symptoms began. She has an autonomic response that appears due to bladder spasms. I discussed with Dr Armen Pickup getting a bladder scan to determine how completely Tracey Hall is emptying her bladder.

## 2012-07-11 NOTE — Progress Notes (Signed)
Family Medicine Teaching Service Daily Progress Note Service Page: [phone]  Subjective:  Continues to feel poorly secondary to body aches.  Objective: Temp:  [98 F (36.7 C)-99.4 F (37.4 C)] 99.4 F (37.4 C) (01/20 2216) Pulse Rate:  [81-87] 86  (01/20 2216) Resp:  [18-19] 19  (01/20 2216) BP: (95-113)/(62-67) 95/65 mmHg (01/20 2216) SpO2:  [99 %-100 %] 100 % (01/20 2216)  Physical Exam: General: resting comfortably in bed this am. NAD. Cardiovascular:  RRR. No murmurs, rubs, or gallops. Respiratory: Normal work of breathing.  CTAB. No rales, rhonchi, or wheezing. Abdomen: obese, soft, nontender, nondistended. Extremities: No edema. Neuro: decreased sensation from the nipple down; unable to move lower extremities.  No additional neurological deficits.  CBC BMET   Lab 07/11/12 0540 07/09/12 1733  WBC 3.1* 5.9  HGB 8.3* 8.6*  HCT 26.5* 27.6*  PLT 337 340    Lab 07/11/12 0540 07/09/12 1733  NA 138 138  K 3.6 3.2*  CL 106 102  CO2 25 23  BUN 4* 16  CREATININE 0.48* 0.58  GLUCOSE 81 81  CALCIUM 8.1* 8.7     Urinalysis    Component Value Date/Time   COLORURINE YELLOW 07/10/2012 1854   APPEARANCEUR CLOUDY* 07/10/2012 1854   LABSPEC 1.016 07/10/2012 1854   PHURINE 6.0 07/10/2012 1854   GLUCOSEU NEGATIVE 07/10/2012 1854   HGBUR MODERATE* 07/10/2012 1854   HGBUR moderate 01/10/2010 1430   BILIRUBINUR NEGATIVE 07/10/2012 1854   BILIRUBINUR NEG 02/19/2012 1440   KETONESUR NEGATIVE 07/10/2012 1854   PROTEINUR 100* 07/10/2012 1854   UROBILINOGEN 1.0 07/10/2012 1854   UROBILINOGEN 1.0 02/19/2012 1440   NITRITE NEGATIVE 07/10/2012 1854   NITRITE NEG 02/19/2012 1440   LEUKOCYTESUR LARGE* 07/10/2012 1854   Imaging/Diagnostic Tests:  Urine Cx - >100,000 CFU Proteus. Sensitive to Levaquin, Zosyn, CTX, Cipro, Bactrim.  Influenza PCR - + H1N1  Assessment/Plan: 32 year old female with PMH of quadriplegia, neurogenic bladder, and recurrent UTI's who presented with diffuse body aches,  chills, and headache.  Urinalysis was obtained and was significant for positive nitrites, large leukocytes, and many bacteria.  Additionally, influenza PCR was obtained and was positive for H1N1.  # Acute complicated pyelonephritis. - Initially started on CTX. Switched to Zosyn on 1/19 given lack of improvement and to broaden coverage. - Given sensitivities transitioning to Levaquin 750 mg today (Day 4 of antibiotics).  Will continue at discharge for an additional 4 days. - Will continue Tylenol, Oxycodone PRN for pain.  # Influenza - Will continue Tamiflu (Day 3) - Will continue IV fluids and encourage PO intake.  # Anemia - Microcytic, Iron <10. Ferritin - 30. - Hemoglobin stable at this time (8.3 this am). - Reticulocyte % low at 0.4.  Will start Iron supplementation today.  FEN/GI: KVO fluids. Regular diet PPx: Heparin SQ Dispo: Discharge home this afternoon.  Everlene Other, DO 07/11/2012, 11:42 PM

## 2012-07-11 NOTE — Progress Notes (Signed)
Nutrition Brief Note  Patient identified on the [place] Report   BMI:  33.8.  Patient meets criteria for obesity grade 1 based on current BMI.   Current diet order is regular, patient is consuming fair intake of meals at this time. Labs and medications reviewed. Patient reports no problems with skin except for small red spot on shin secondary to eczema.  Expect patient meeting >90% of needs when well.  No recent weight changes.  No nutrition interventions warranted at this time. If nutrition issues arise, please consult RD.   Oran Rein, RD, LDN Clinical Inpatient Dietitian Pager:  [phone] Weekend and after hours pager:  [phone]

## 2012-07-12 LAB — RETICULOCYTES
RBC.: 3.72 MIL/uL — ABNORMAL LOW (ref 3.87–5.11)
Retic Ct Pct: 0.4 % (ref 0.4–3.1)

## 2012-07-12 LAB — CBC
Hemoglobin: 8.3 g/dL — ABNORMAL LOW (ref 12.0–15.0)
Platelets: 345 10*3/uL (ref 150–400)
RBC: 3.72 MIL/uL — ABNORMAL LOW (ref 3.87–5.11)
WBC: 4 10*3/uL (ref 4.0–10.5)

## 2012-07-12 LAB — URINE CULTURE

## 2012-07-12 MED ORDER — OSELTAMIVIR PHOSPHATE 75 MG PO CAPS: 75.0000 mg | ORAL_CAPSULE | Freq: Two times a day (BID) | ORAL | Status: AC

## 2012-07-12 MED ORDER — FERROUS SULFATE 325 (65 FE) MG PO TABS
325.0000 mg | ORAL_TABLET | Freq: Two times a day (BID) | ORAL | Status: DC
  Administered 2012-07-12: 325 mg via ORAL
  Filled 2012-07-12 (×3): qty 1

## 2012-07-12 MED ORDER — CIPROFLOXACIN HCL 500 MG PO TABS: 500.0000 mg | ORAL_TABLET | Freq: Two times a day (BID) | ORAL | Status: AC

## 2012-07-12 MED ORDER — FERROUS SULFATE 325 (65 FE) MG PO TABS: 325.0000 mg | ORAL_TABLET | Freq: Two times a day (BID) | ORAL | Status: DC

## 2012-07-12 MED ORDER — ONDANSETRON HCL 4 MG PO TABS: 4.0000 mg | ORAL_TABLET | Freq: Three times a day (TID) | ORAL | Status: DC | PRN

## 2012-07-12 MED ORDER — TAMSULOSIN HCL 0.4 MG PO CAPS
0.4000 mg | ORAL_CAPSULE | Freq: Every day | ORAL | Status: DC
  Administered 2012-07-12: 0.4 mg via ORAL
  Filled 2012-07-12: qty 1

## 2012-07-12 MED ORDER — TAMSULOSIN HCL 0.4 MG PO CAPS: 0.4000 mg | ORAL_CAPSULE | Freq: Every day | ORAL | Status: DC

## 2012-07-12 MED ORDER — CIPROFLOXACIN HCL 500 MG PO TABS
500.0000 mg | ORAL_TABLET | Freq: Two times a day (BID) | ORAL | Status: DC
  Administered 2012-07-12: 500 mg via ORAL
  Filled 2012-07-12 (×3): qty 1

## 2012-07-12 MED ORDER — LEVOFLOXACIN 750 MG PO TABS
750.0000 mg | ORAL_TABLET | Freq: Every day | ORAL | Status: DC
  Filled 2012-07-12: qty 1

## 2012-07-12 NOTE — Progress Notes (Signed)
Patient discharged in stable condition via personal wheelchair. Discharge instructions and prescriptions were given and explained.

## 2012-07-12 NOTE — Discharge Summary (Signed)
Family Medicine Teaching [facility] Discharge Summary  Patient name: Tracey Hall Medical record number: [id number] Date of birth: 1980/08/31 Age: 32 y.o. Gender: female Date of Admission: 07/09/2012  Date of Discharge: 07/12/12 Admitting Physician: [name], MD  Primary Care Provider: Dessa Phi, MD  Indication for Hospitalization: Diffuse body aches, urinary tract infection  Discharge Diagnoses:  Acute complicated pyelonephritis Influenza - H1N1 Iron deficiency anemia Neurogenic bladder  Brief Hospital Course:  32 year old female with PMH of quadriplegia, neurogenic bladder, and recurrent UTI's who presented with diffuse body aches, chills, and headache. Urinalysis was obtained and was significant for positive nitrites, large leukocytes, and many bacteria. Additionally, influenza PCR was obtained and was positive for H1N1.   1) Acute complicated pyelonephritis - Given chills and tachycardia on admission, patient was diagnosed clinically with pyelonephritis (complicated due to underlying quadriplegia and neurogenic bladder) - Urine culture was obtained and patient was started on IV CTX in the ED.  Coverage was subsequently broaden to Zosyn given concern for enterococcus and potential resistance from recurrent UTI's. - Patient was treated with IV Zosyn for 2 days (total 3 days of IV antibiotics). Urine culture then returned growing Proteus sensitive to Levaquin, Zosyn, CTX, Cipro, and Bactrim (see below) - Patient was then transitioned to Cipro and was discharged on additional 10 days of antibiotics to complete 14 day course.  2) Influenza - H1N1 - Given diffuse body aches and chills, Influenza PCR was obtained and was positive for H1N1 - Patient was treated with Tamiflu for 3 days. - Discharged on Tamiflu to complete 5 day course.  3) Iron deficiency anemia - Patient noted to have microcytic anemia during admission.  Last year hemoglobin was 12.6.  On admission,  patient's hemoglobin was 8.6. - Iron, ferritin, and reticulocytes were obtained.  Iron was low at <10, ferritin was WNL at 30, and reticulocyte count was low at 0.4%.  Diagnosis consistent with iron deficiency anemia. - Patient was started on iron supplementation during admission.  4) Neurogenic bladder - Patient noted to have post void residual of 300 mL. - Patient subsequently started on Flomax. - Will likely need urology referral.  Significant Labs and Imaging:   CBC BMET   Lab 07/12/12 0630 07/11/12 0540 07/09/12 1733  WBC 4.0 3.1* 5.9  HGB 8.3* 8.3* 8.6*  HCT 26.3* 26.5* 27.6*  PLT 345 337 340    Lab 07/11/12 0540 07/09/12 1733  NA 138 138  K 3.6 3.2*  CL 106 102  CO2 25 23  BUN 4* 16  CREATININE 0.48* 0.58  GLUCOSE 81 81  CALCIUM 8.1* 8.7     Iron/TIBC/Ferritin    Component Value Date/Time   IRON <10* 07/09/2012 2142   TIBC Not calculated due to Iron <10. 07/09/2012 2142   FERRITIN 30 07/11/2012 0540   Reticulocyte - 0.4%.  Urine Cx - >100,000 CFU Proteus. Sensitive to Levaquin, Zosyn, CTX, Cipro, Bactrim.   Influenza PCR - + H1N1  Procedures: None  Consultations: None  Discharge Medications:    Medication List     As of 07/12/2012  1:02 PM    TAKE these medications         ciprofloxacin 500 MG tablet   Commonly known as: CIPRO   Take 1 tablet (500 mg total) by mouth 2 (two) times daily. First dose is this evening.      ferrous sulfate 325 (65 FE) MG tablet   Take 1 tablet (325 mg total) by mouth 2 (two) times  daily with a meal.      ondansetron 4 MG tablet   Commonly known as: ZOFRAN   Take 1 tablet (4 mg total) by mouth every 8 (eight) hours as needed.      oseltamivir 75 MG capsule   Commonly known as: TAMIFLU   Take 1 capsule (75 mg total) by mouth 2 (two) times daily. Next dose is this evening.      Tamsulosin HCl 0.4 MG Caps   Commonly known as: FLOMAX   Take 1 capsule (0.4 mg total) by mouth daily.       Issues for Follow Up:  1)  Completion of antibiotic course 2) Consider referral to urology  3) Continued work up of anemia.  Recommend hemoccult cards.  Patient started on Iron supplementation during admission.  Outstanding Results: None  Discharge Instructions: Patient was counseled important signs and symptoms that should prompt return to medical care, changes in medications, dietary instructions, activity restrictions, and follow up appointments.        Follow-up Information    Follow up with Dessa Phi, MD. On 07/15/2012. (230 pm)    Contact information:   [address] [place] [address] [phone]          Discharge Condition: Stable. Discharged home.  Adriana Simas Valdez, DO 07/12/2012, 1:02 PM

## 2012-07-12 NOTE — Progress Notes (Signed)
I examined this patient and discussed the care plan with Dr Cook and the [facility] team and agree with assessment and plan as documented in the progress note above.  

## 2012-07-12 NOTE — Discharge Summary (Signed)
I examined this patient and discussed the care plan with Dr Cook and the [facility] team and agree with assessment and plan as documented in the discharge note above.  

## 2012-07-12 NOTE — Progress Notes (Signed)
Post void residual was 300 cc. MD aware. Will order the flomax 0.4 mg daily per [facility].

## 2012-07-15 ENCOUNTER — Inpatient Hospital Stay: Payer: Medicare Other | Admitting: Family Medicine

## 2012-08-30 ENCOUNTER — Telehealth: Payer: Self-pay | Admitting: Family Medicine

## 2012-08-30 NOTE — Telephone Encounter (Signed)
DMV paperwork to be completed by [employer].

## 2012-08-31 NOTE — Telephone Encounter (Signed)
Form filled out and returned to [place].

## 2012-08-31 NOTE — Telephone Encounter (Signed)
Tracey Hall notified DMV form are ready to be picked up at front desk.  Ileana Ladd

## 2012-09-03 ENCOUNTER — Other Ambulatory Visit (HOSPITAL_COMMUNITY): Payer: Self-pay | Admitting: Family Medicine

## 2013-02-09 ENCOUNTER — Telehealth: Payer: Self-pay | Admitting: *Deleted

## 2013-02-09 NOTE — Telephone Encounter (Signed)
lorena calling to inquire about pt needing personal services? At request of Dr. Armen Pickup and would like to speak to her nurse or Dr. Armen Pickup regarding pt. - message left on physician line. Wyatt Haste, RN-BSN

## 2013-02-10 NOTE — Telephone Encounter (Signed)
I believe that I am now the PCP for this patient but I have never seen her before in clinic. If she has some disability that needs personal services at home I am happy to see her in clinic to assess these needs and fill out the indicated paperwork if appropriate. Thanks so much Charlane Ferretti, MD

## 2013-02-10 NOTE — Telephone Encounter (Signed)
Called back to [place]. Patient has requested personal care services through her that are covered under medicaid. In order to initiate services patient needs MD visit in the past 90 days and Lorena needs patient's qualifying ICD-9 codes.  I informed Era Bumpers that patient has not had MD visit in the last 90 days. She will need an appt with her new PCP. She should bring the form to her appt.   I did not provide patient's information over the phone as I have no consent from the patient  to do so.

## 2013-02-10 NOTE — Telephone Encounter (Signed)
Left message for pt to call office back and make a new appt.  Please schedule this with her when she does.  Thanks [employer]

## 2013-02-16 ENCOUNTER — Ambulatory Visit (INDEPENDENT_AMBULATORY_CARE_PROVIDER_SITE_OTHER): Payer: Medicare Other | Admitting: Family Medicine

## 2013-02-16 ENCOUNTER — Encounter: Payer: Self-pay | Admitting: Family Medicine

## 2013-02-16 VITALS — BP 100/65 | HR 102 | Temp 101.8°F

## 2013-02-16 DIAGNOSIS — Z862 Personal history of diseases of the blood and blood-forming organs and certain disorders involving the immune mechanism: Secondary | ICD-10-CM | POA: Diagnosis not present

## 2013-02-16 DIAGNOSIS — N319 Neuromuscular dysfunction of bladder, unspecified: Secondary | ICD-10-CM | POA: Diagnosis not present

## 2013-02-16 DIAGNOSIS — G8250 Quadriplegia, unspecified: Secondary | ICD-10-CM | POA: Diagnosis not present

## 2013-02-16 DIAGNOSIS — R5381 Other malaise: Secondary | ICD-10-CM

## 2013-02-16 LAB — CBC
Hemoglobin: 9.5 g/dL — ABNORMAL LOW (ref 12.0–15.0)
MCH: 25.7 pg — ABNORMAL LOW (ref 26.0–34.0)
MCV: 79.4 fL (ref 78.0–100.0)
RBC: 3.69 MIL/uL — ABNORMAL LOW (ref 3.87–5.11)

## 2013-02-16 MED ORDER — TAMSULOSIN HCL 0.4 MG PO CAPS: ORAL_CAPSULE | ORAL | Status: DC

## 2013-02-16 MED ORDER — FLUTICASONE-SALMETEROL 100-50 MCG/DOSE IN AEPB: 1.0000 | INHALATION_SPRAY | Freq: Two times a day (BID) | RESPIRATORY_TRACT | Status: DC | PRN

## 2013-02-16 MED ORDER — ALBUTEROL SULFATE HFA 108 (90 BASE) MCG/ACT IN AERS: 2.0000 | INHALATION_SPRAY | Freq: Four times a day (QID) | RESPIRATORY_TRACT | Status: DC | PRN

## 2013-02-16 MED ORDER — ONDANSETRON HCL 4 MG PO TABS: 4.0000 mg | ORAL_TABLET | Freq: Three times a day (TID) | ORAL | Status: DC | PRN

## 2013-02-16 NOTE — Progress Notes (Signed)
Patient ID: Tracey Hall, female   DOB: 1981-02-28, 32 y.o.   MRN: [id number]  [facility] Zandria Woldt M. Daevion Navarette, MD Phone: [phone]   Subjective: HPI: Patient is a 32 y.o. female presenting to clinic today for same day appointment.  1. Bladder spasm - Has neurogenic bladder. Had been on Flomax this past winter after discharge from the hospital which helped with ?spasms. She has no dysuria today and does not feel like she not have an infection today. She states she knows when she is getting a UTI and right now she is not. She does however report residual urine   2. Requesting specialist for legs - has to keep them folded in her chair. Causes pain in legs from foot drop and also blood pressure. Tilts chair for dizziness and posture. She is getting more weak in her core muscles. She would like to talk to someone about how she can adjust her position and how to make her legs more comfortable  History Reviewed: Non-smoker.  ROS: Please see HPI above.  Objective: Office vital signs reviewed. BP 100/65  Pulse 102  Temp(Src) 101.8 F (38.8 C) (Oral)  LMP 02/03/2013  Physical Examination:  General: Awake, alert. NAD. In motorized wheelchair HEENT: Atraumatic, normocephalic. MMM Pulm: CTAB, no wheezes Cardio: RRR Abdomen: soft, nontender, nondistended Extremities: Contractures of hands. Moves elbows and some shoulder movement. Legs are thin and folded in chair. She is slumped forward  Assessment: 32 y.o. female same day  Plan: See Problem List and After Visit Summary

## 2013-02-16 NOTE — Patient Instructions (Addendum)
It was nice to meet you today.  I have sent your medications to the pharmacy.  We will send you to [facility] to discuss your legs and position in the chair. They are the best of the best as far as this is concerned. Please keep your appointment with them.  We are also checking labs since we have not done this since January.  Please follow up with [place] within the next few months.  Sovereign Ramiro M. Okechukwu Regnier, M.D.

## 2013-02-17 ENCOUNTER — Encounter: Payer: Self-pay | Admitting: Family Medicine

## 2013-02-17 LAB — BASIC METABOLIC PANEL
CO2: 22 mEq/L (ref 19–32)
Calcium: 9.4 mg/dL (ref 8.4–10.5)
Glucose, Bld: 96 mg/dL (ref 70–99)
Sodium: 135 mEq/L (ref 135–145)

## 2013-02-17 NOTE — Assessment & Plan Note (Signed)
Will refer to PM&R for help with her posture, foot drop and overall improvement in quality of life. Will check routine labs today.

## 2013-02-17 NOTE — Assessment & Plan Note (Signed)
No concern for UTI today. Will treat with Flomax to see if that makes her feel better. RTC in 1 month for routine follow up.

## 2013-04-07 ENCOUNTER — Encounter: Payer: Self-pay | Admitting: Family Medicine

## 2013-04-30 ENCOUNTER — Emergency Department (HOSPITAL_COMMUNITY)
Admission: EM | Admit: 2013-04-30 | Discharge: 2013-04-30 | Disposition: A | Discharge status: Home / Self Care | Payer: Medicare Other | Attending: Emergency Medicine | Admitting: Emergency Medicine

## 2013-04-30 ENCOUNTER — Encounter (HOSPITAL_COMMUNITY): Payer: Self-pay | Admitting: Emergency Medicine

## 2013-04-30 DIAGNOSIS — Z87828 Personal history of other (healed) physical injury and trauma: Secondary | ICD-10-CM | POA: Diagnosis not present

## 2013-04-30 DIAGNOSIS — N390 Urinary tract infection, site not specified: Secondary | ICD-10-CM | POA: Diagnosis not present

## 2013-04-30 DIAGNOSIS — G8250 Quadriplegia, unspecified: Secondary | ICD-10-CM | POA: Insufficient documentation

## 2013-04-30 DIAGNOSIS — L309 Dermatitis, unspecified: Secondary | ICD-10-CM

## 2013-04-30 DIAGNOSIS — Z8742 Personal history of other diseases of the female genital tract: Secondary | ICD-10-CM | POA: Insufficient documentation

## 2013-04-30 DIAGNOSIS — L259 Unspecified contact dermatitis, unspecified cause: Secondary | ICD-10-CM | POA: Diagnosis not present

## 2013-04-30 DIAGNOSIS — Z9851 Tubal ligation status: Secondary | ICD-10-CM | POA: Diagnosis not present

## 2013-04-30 DIAGNOSIS — Z91040 Latex allergy status: Secondary | ICD-10-CM | POA: Diagnosis not present

## 2013-04-30 DIAGNOSIS — Z79899 Other long term (current) drug therapy: Secondary | ICD-10-CM | POA: Diagnosis not present

## 2013-04-30 LAB — COMPREHENSIVE METABOLIC PANEL
ALT: 11 U/L (ref 0–35)
AST: 20 U/L (ref 0–37)
CO2: 21 mEq/L (ref 19–32)
Calcium: 9 mg/dL (ref 8.4–10.5)
Chloride: 103 mEq/L (ref 96–112)
Creatinine, Ser: 0.51 mg/dL (ref 0.50–1.10)
GFR calc Af Amer: >90 mL/min (ref >90)
GFR calc non Af Amer: >90 mL/min (ref >90)
Glucose, Bld: 84 mg/dL (ref 70–99)
Sodium: 138 mEq/L (ref 135–145)
Total Bilirubin: 0.2 mg/dL — ABNORMAL LOW (ref 0.3–1.2)
Total Protein: 7.5 g/dL (ref 6.0–8.3)

## 2013-04-30 LAB — RAPID URINE DRUG SCREEN, HOSP PERFORMED
Cocaine: NOT DETECTED
Opiates: NOT DETECTED
Tetrahydrocannabinol: POSITIVE — AB

## 2013-04-30 LAB — URINALYSIS, ROUTINE W REFLEX MICROSCOPIC
Bilirubin Urine: NEGATIVE
Glucose, UA: NEGATIVE mg/dL
Ketones, ur: NEGATIVE mg/dL
Urobilinogen, UA: 0.2 mg/dL (ref 0.0–1.0)
pH: 6.5 (ref 5.0–8.0)

## 2013-04-30 LAB — CBC
HCT: 29.9 % — ABNORMAL LOW (ref 36.0–46.0)
Hemoglobin: 10.1 g/dL — ABNORMAL LOW (ref 12.0–15.0)
MCH: 26.4 pg (ref 26.0–34.0)
MCHC: 33.8 g/dL (ref 30.0–36.0)
Platelets: 438 10*3/uL — ABNORMAL HIGH (ref 150–400)
RBC: 3.82 MIL/uL — ABNORMAL LOW (ref 3.87–5.11)
RDW: 18.5 % — ABNORMAL HIGH (ref 11.5–15.5)

## 2013-04-30 LAB — CG4 I-STAT (LACTIC ACID): Lactic Acid, Venous: 2.36 mmol/L — ABNORMAL HIGH (ref 0.5–2.2)

## 2013-04-30 LAB — URINE MICROSCOPIC-ADD ON

## 2013-04-30 MED ORDER — HYDROCORTISONE 1 % EX CREA: TOPICAL_CREAM | CUTANEOUS | Status: DC

## 2013-04-30 MED ORDER — CEPHALEXIN 250 MG PO CAPS: 500.0000 mg | ORAL_CAPSULE | Freq: Once | ORAL | Status: DC

## 2013-04-30 MED ORDER — SODIUM CHLORIDE 0.9 % IV BOLUS (SEPSIS)
1000.0000 mL | Freq: Once | INTRAVENOUS | Status: AC
  Administered 2013-04-30: 1000 mL via INTRAVENOUS

## 2013-04-30 MED ORDER — DEXTROSE 5 % IV SOLN
1.0000 g | Freq: Once | INTRAVENOUS | Status: AC
  Administered 2013-04-30: 1 g via INTRAVENOUS
  Filled 2013-04-30: qty 10

## 2013-04-30 MED ORDER — PHENAZOPYRIDINE HCL 200 MG PO TABS: 200.0000 mg | ORAL_TABLET | Freq: Three times a day (TID) | ORAL | Status: DC

## 2013-04-30 MED ORDER — CEPHALEXIN 500 MG PO CAPS: 500.0000 mg | ORAL_CAPSULE | Freq: Two times a day (BID) | ORAL | Status: DC

## 2013-04-30 MED ORDER — CLOBETASOL PROPIONATE 0.05 % EX LOTN: 1.0000 | TOPICAL_LOTION | Freq: Two times a day (BID) | CUTANEOUS | Status: DC
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
## 2013-04-30 NOTE — ED Notes (Signed)
PT c/o fouls smelling urine and pressure when she urinates.  Also c/o blisters to R leg.  She states the blisters are a form of eczema that flare up whenever she has a uti.

## 2013-05-02 ENCOUNTER — Telehealth: Payer: Self-pay | Admitting: Family Medicine

## 2013-05-02 LAB — URINE CULTURE
Culture: >=100000
Special Requests: NORMAL

## 2013-05-02 NOTE — Telephone Encounter (Signed)
Form dropped off to be filled out for transportation.  Please fax to [phone] when completed.

## 2013-05-03 NOTE — ED Notes (Signed)
Post ED Visit - Positive Culture Follow-up ° °Culture report reviewed by antimicrobial stewardship pharmacist: °[] Wes Dulaney, Pharm.D., BCPS °[] Jeremy Frens, Pharm.D., BCPS °[] Elizabeth Martin, Pharm.D., BCPS °[] Minh Pham, Pharm.D., BCPS, AAHIVP °[x] Michelle Turner, Pharm.D., BCPS, AAHIVP ° °Positive urine culture °Treated with Keflex, organism sensitive to the same and no further patient follow-up is required at this time. ° °Tracey Hall °05/03/2013, 10:51 AM ° ° °

## 2013-05-03 NOTE — Telephone Encounter (Signed)
Placed in MDs box. Tracey Hall Dawn  

## 2013-05-06 LAB — CULTURE, BLOOD (ROUTINE X 2): Culture: NO GROWTH

## 2013-05-08 NOTE — Telephone Encounter (Signed)
Faxed to number requested. [place], Maryjo [place]

## 2013-07-12 ENCOUNTER — Telehealth: Payer: Self-pay | Admitting: Family Medicine

## 2013-07-17 ENCOUNTER — Telehealth: Payer: Self-pay | Admitting: Family Medicine

## 2013-07-17 NOTE — Telephone Encounter (Signed)
Pt called and needs her shot records faxed to [phone] attention immunizations with Pt name, DOB, Clinic stamp and her student ID [id number]. jw

## 2013-07-17 NOTE — Telephone Encounter (Signed)
Shot record faxed. Jazmin Hartsell,CMA

## 2013-07-21 ENCOUNTER — Ambulatory Visit (INDEPENDENT_AMBULATORY_CARE_PROVIDER_SITE_OTHER): Payer: Medicare Other | Admitting: *Deleted

## 2013-07-21 DIAGNOSIS — Z23 Encounter for immunization: Secondary | ICD-10-CM | POA: Diagnosis not present

## 2013-07-21 NOTE — Progress Notes (Signed)
Patient here today to receive immunizations for school.  Per [employer]--needs Tdap & MMR #2.  Patient declined flu vaccine.  Vaccines given and info updated in [employer].  Copy of vaccination record faxed to school at [phone].  Burna Forts, BSN, RN-BC

## 2013-10-01 ENCOUNTER — Encounter (HOSPITAL_COMMUNITY): Payer: Self-pay | Admitting: Emergency Medicine

## 2013-10-01 DIAGNOSIS — L138 Other specified bullous disorders: Secondary | ICD-10-CM | POA: Diagnosis not present

## 2013-10-01 DIAGNOSIS — G8250 Quadriplegia, unspecified: Secondary | ICD-10-CM | POA: Diagnosis not present

## 2013-10-01 DIAGNOSIS — Z8742 Personal history of other diseases of the female genital tract: Secondary | ICD-10-CM | POA: Diagnosis not present

## 2013-10-01 DIAGNOSIS — L259 Unspecified contact dermatitis, unspecified cause: Secondary | ICD-10-CM | POA: Insufficient documentation

## 2013-10-01 DIAGNOSIS — Z91040 Latex allergy status: Secondary | ICD-10-CM | POA: Insufficient documentation

## 2013-10-01 DIAGNOSIS — Z87828 Personal history of other (healed) physical injury and trauma: Secondary | ICD-10-CM | POA: Insufficient documentation

## 2013-10-01 DIAGNOSIS — N390 Urinary tract infection, site not specified: Secondary | ICD-10-CM | POA: Diagnosis not present

## 2013-10-01 NOTE — ED Notes (Signed)
Unable to get the thermometer to register orally

## 2013-10-01 NOTE — ED Notes (Signed)
The pt  Is a modified quadraplegic  W/c bound.   She has eczema and for the past 1-2 months the rash ahs been worse than usual.  She reports that this usually happens when she has a uti

## 2013-10-02 ENCOUNTER — Emergency Department (HOSPITAL_COMMUNITY)
Admission: EM | Admit: 2013-10-02 | Discharge: 2013-10-02 | Disposition: A | Discharge status: Home / Self Care | Payer: Medicare Other | Attending: Emergency Medicine | Admitting: Emergency Medicine

## 2013-10-02 DIAGNOSIS — N390 Urinary tract infection, site not specified: Secondary | ICD-10-CM

## 2013-10-02 DIAGNOSIS — L309 Dermatitis, unspecified: Secondary | ICD-10-CM

## 2013-10-02 LAB — URINALYSIS, ROUTINE W REFLEX MICROSCOPIC
GLUCOSE, UA: NEGATIVE mg/dL
KETONES UR: 15 mg/dL — AB
Nitrite: NEGATIVE
PROTEIN: 100 mg/dL — AB
Specific Gravity, Urine: >1.046 — ABNORMAL HIGH (ref 1.005–1.030)
UROBILINOGEN UA: 1 mg/dL (ref 0.0–1.0)
pH: 6.5 (ref 5.0–8.0)

## 2013-10-02 LAB — URINE MICROSCOPIC-ADD ON

## 2013-10-02 MED ORDER — SULFAMETHOXAZOLE-TRIMETHOPRIM 800-160 MG PO TABS: 1.0000 | ORAL_TABLET | Freq: Two times a day (BID) | ORAL | Status: DC

## 2013-10-02 MED ORDER — TRIAMCINOLONE ACETONIDE 0.1 % EX OINT: 1.0000 "application " | TOPICAL_OINTMENT | Freq: Two times a day (BID) | CUTANEOUS | Status: DC
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
## 2013-10-02 NOTE — ED Notes (Signed)
Patient Presents with c/o her ezema that has been getting worse.  Also states that she thinks she may have a UTI.  In and Out cath done and urine sent

## 2013-10-02 NOTE — Discharge Instructions (Signed)
Eczema Eczema, also called atopic dermatitis, is a skin disorder that causes inflammation of the skin. It causes a red rash and dry, scaly skin. The skin becomes very itchy. Eczema is generally worse during the cooler winter months and often improves with the warmth of summer. Eczema usually starts showing signs in infancy. Some children outgrow eczema, but it may last through adulthood.  CAUSES  The exact cause of eczema is not known, but it appears to run in families. People with eczema often have a family history of eczema, allergies, asthma, or hay fever. Eczema is not contagious. Flare-ups of the condition may be caused by:   Contact with something you are sensitive or allergic to.   Stress. SIGNS AND SYMPTOMS  Dry, scaly skin.   Red, itchy rash.   Itchiness. This may occur before the skin rash and may be very intense.  DIAGNOSIS  The diagnosis of eczema is usually made based on symptoms and medical history. TREATMENT  Eczema cannot be cured, but symptoms usually can be controlled with treatment and other strategies. A treatment plan might include:  Controlling the itching and scratching.   Use over-the-counter antihistamines as directed for itching. This is especially useful at night when the itching tends to be worse.   Use over-the-counter steroid creams as directed for itching.   Avoid scratching. Scratching makes the rash and itching worse. It may also result in a skin infection (impetigo) due to a break in the skin caused by scratching.   Keeping the skin well moisturized with creams every day. This will seal in moisture and help prevent dryness. Lotions that contain alcohol and water should be avoided because they can dry the skin.   Limiting exposure to things that you are sensitive or allergic to (allergens).   Recognizing situations that cause stress.   Developing a plan to manage stress.  HOME CARE INSTRUCTIONS   Only take over-the-counter or  prescription medicines as directed by your health care provider.   Do not use anything on the skin without checking with your health care provider.   Keep baths or showers short (5 minutes) in warm (not hot) water. Use mild cleansers for bathing. These should be unscented. You may add nonperfumed bath oil to the bath water. It is best to avoid soap and bubble bath.   Immediately after a bath or shower, when the skin is still damp, apply a moisturizing ointment to the entire body. This ointment should be a petroleum ointment. This will seal in moisture and help prevent dryness. The thicker the ointment, the better. These should be unscented.   Keep fingernails cut short. Children with eczema may need to wear soft gloves or mittens at night after applying an ointment.   Dress in clothes made of cotton or cotton blends. Dress lightly, because heat increases itching.   A child with eczema should stay away from anyone with fever blisters or cold sores. The virus that causes fever blisters (herpes simplex) can cause a serious skin infection in children with eczema. SEEK MEDICAL CARE IF:   Your itching interferes with sleep.   Your rash gets worse or is not better within 1 week after starting treatment.   You see pus or soft yellow scabs in the rash area.   You have a fever.   You have a rash flare-up after contact with someone who has fever blisters.  Document Released: 06/05/2000 Document Revised: 03/29/2013 Document Reviewed: 01/09/2013 [facility] Patient Information 2014 [place].  Urinary  Tract Infection Urinary tract infections (UTIs) can develop anywhere along your urinary tract. Your urinary tract is your body's drainage system for removing wastes and extra water. Your urinary tract includes two kidneys, two ureters, a bladder, and a urethra. Your kidneys are a pair of bean-shaped organs. Each kidney is about the size of your fist. They are located below your ribs, one on  each side of your spine. CAUSES Infections are caused by microbes, which are microscopic organisms, including fungi, viruses, and bacteria. These organisms are so small that they can only be seen through a microscope. Bacteria are the microbes that most commonly cause UTIs. SYMPTOMS  Symptoms of UTIs may vary by age and gender of the patient and by the location of the infection. Symptoms in young women typically include a frequent and intense urge to urinate and a painful, burning feeling in the bladder or urethra during urination. Older women and men are more likely to be tired, shaky, and weak and have muscle aches and abdominal pain. A fever may mean the infection is in your kidneys. Other symptoms of a kidney infection include pain in your back or sides below the ribs, nausea, and vomiting. DIAGNOSIS To diagnose a UTI, your caregiver will ask you about your symptoms. Your caregiver also will ask to provide a urine sample. The urine sample will be tested for bacteria and white blood cells. White blood cells are made by your body to help fight infection. TREATMENT  Typically, UTIs can be treated with medication. Because most UTIs are caused by a bacterial infection, they usually can be treated with the use of antibiotics. The choice of antibiotic and length of treatment depend on your symptoms and the type of bacteria causing your infection. HOME CARE INSTRUCTIONS  If you were prescribed antibiotics, take them exactly as your caregiver instructs you. Finish the medication even if you feel better after you have only taken some of the medication.  Drink enough water and fluids to keep your urine clear or pale yellow.  Avoid caffeine, tea, and carbonated beverages. They tend to irritate your bladder.  Empty your bladder often. Avoid holding urine for long periods of time.  Empty your bladder before and after sexual intercourse.  After a bowel movement, women should cleanse from front to back. Use  each tissue only once. SEEK MEDICAL CARE IF:   You have back pain.  You develop a fever.  Your symptoms do not begin to resolve within 3 days. SEEK IMMEDIATE MEDICAL CARE IF:   You have severe back pain or lower abdominal pain.  You develop chills.  You have nausea or vomiting.  You have continued burning or discomfort with urination. MAKE SURE YOU:   Understand these instructions.  Will watch your condition.  Will get help right away if you are not doing well or get worse. Document Released: 03/18/2005 Document Revised: 12/08/2011 Document Reviewed: 07/17/2011 [facility] Patient Information 2014 [place].

## 2013-10-11 ENCOUNTER — Telehealth: Payer: Self-pay | Admitting: Family Medicine

## 2013-10-11 DIAGNOSIS — G8250 Quadriplegia, unspecified: Secondary | ICD-10-CM

## 2013-10-11 NOTE — Telephone Encounter (Signed)
Pt called and needs either a new wheelchair or his fixed and [facility] said that the doctor has to request it. Blima Rich

## 2013-10-11 NOTE — Telephone Encounter (Signed)
Will forward to MD.  Pt has medicare and medicaid so this will require an order from a faculty MD also.  Drayk Humbarger,CMA

## 2013-10-13 NOTE — ED Provider Notes (Signed)
Medical screening examination/treatment/procedure(s) were performed by non-physician practitioner and as supervising physician I was immediately available for consultation/collaboration.   EKG Interpretation None       Kalman Drape, MD 10/13/13 1739

## 2013-10-17 NOTE — Telephone Encounter (Signed)
Form faxed to [facility] [phone].  Jazmin Hartsell,CMA

## 2014-04-01 ENCOUNTER — Emergency Department (HOSPITAL_COMMUNITY)
Admission: EM | Admit: 2014-04-01 | Discharge: 2014-04-01 | Disposition: A | Discharge status: Home / Self Care | Payer: Medicare Other | Attending: Emergency Medicine | Admitting: Emergency Medicine

## 2014-04-01 ENCOUNTER — Emergency Department (HOSPITAL_COMMUNITY): Payer: Medicare Other

## 2014-04-01 ENCOUNTER — Encounter (HOSPITAL_COMMUNITY): Payer: Self-pay | Admitting: Emergency Medicine

## 2014-04-01 DIAGNOSIS — Z87828 Personal history of other (healed) physical injury and trauma: Secondary | ICD-10-CM | POA: Diagnosis not present

## 2014-04-01 DIAGNOSIS — L988 Other specified disorders of the skin and subcutaneous tissue: Secondary | ICD-10-CM | POA: Diagnosis not present

## 2014-04-01 DIAGNOSIS — Z87448 Personal history of other diseases of urinary system: Secondary | ICD-10-CM | POA: Diagnosis not present

## 2014-04-01 DIAGNOSIS — Z3202 Encounter for pregnancy test, result negative: Secondary | ICD-10-CM | POA: Insufficient documentation

## 2014-04-01 DIAGNOSIS — B9689 Other specified bacterial agents as the cause of diseases classified elsewhere: Secondary | ICD-10-CM | POA: Diagnosis not present

## 2014-04-01 DIAGNOSIS — Z91040 Latex allergy status: Secondary | ICD-10-CM | POA: Insufficient documentation

## 2014-04-01 DIAGNOSIS — D649 Anemia, unspecified: Secondary | ICD-10-CM | POA: Diagnosis not present

## 2014-04-01 DIAGNOSIS — M79643 Pain in unspecified hand: Secondary | ICD-10-CM | POA: Diagnosis present

## 2014-04-01 DIAGNOSIS — N390 Urinary tract infection, site not specified: Secondary | ICD-10-CM | POA: Insufficient documentation

## 2014-04-01 LAB — CBC WITH DIFFERENTIAL/PLATELET
BASOS ABS: 0 10*3/uL (ref 0.0–0.1)
Basophils Relative: 0 % (ref 0–1)
EOS PCT: 5 % (ref 0–5)
Eosinophils Absolute: 0.3 10*3/uL (ref 0.0–0.7)
HEMATOCRIT: 27.8 % — AB (ref 36.0–46.0)
HEMOGLOBIN: 9.1 g/dL — AB (ref 12.0–15.0)
LYMPHS PCT: 35 % (ref 12–46)
Lymphs Abs: 2.5 10*3/uL (ref 0.7–4.0)
MCH: 25 pg — ABNORMAL LOW (ref 26.0–34.0)
MCHC: 32.7 g/dL (ref 30.0–36.0)
MCV: 76.4 fL — AB (ref 78.0–100.0)
MONO ABS: 0.4 10*3/uL (ref 0.1–1.0)
MONOS PCT: 5 % (ref 3–12)
Neutro Abs: 4 10*3/uL (ref 1.7–7.7)
Neutrophils Relative %: 55 % (ref 43–77)
Platelets: 463 10*3/uL — ABNORMAL HIGH (ref 150–400)
RBC: 3.64 MIL/uL — ABNORMAL LOW (ref 3.87–5.11)
RDW: 17.9 % — ABNORMAL HIGH (ref 11.5–15.5)
WBC: 7.2 10*3/uL (ref 4.0–10.5)

## 2014-04-01 LAB — URINALYSIS, ROUTINE W REFLEX MICROSCOPIC
Bilirubin Urine: NEGATIVE
Glucose, UA: NEGATIVE mg/dL
Ketones, ur: 15 mg/dL — AB
NITRITE: NEGATIVE
PH: 7 (ref 5.0–8.0)
Protein, ur: 100 mg/dL — AB
SPECIFIC GRAVITY, URINE: 1.04 — AB (ref 1.005–1.030)
UROBILINOGEN UA: 1 mg/dL (ref 0.0–1.0)

## 2014-04-01 LAB — BASIC METABOLIC PANEL
ANION GAP: 11 (ref 5–15)
BUN: 17 mg/dL (ref 6–23)
CALCIUM: 8.9 mg/dL (ref 8.4–10.5)
CO2: 24 meq/L (ref 19–32)
CREATININE: 0.58 mg/dL (ref 0.50–1.10)
Chloride: 104 mEq/L (ref 96–112)
GFR calc Af Amer: >90 mL/min (ref >90)
GFR calc non Af Amer: >90 mL/min (ref >90)
Glucose, Bld: 129 mg/dL — ABNORMAL HIGH (ref 70–99)
Potassium: 3.5 mEq/L — ABNORMAL LOW (ref 3.7–5.3)
Sodium: 139 mEq/L (ref 137–147)

## 2014-04-01 LAB — URINE MICROSCOPIC-ADD ON

## 2014-04-01 LAB — PREGNANCY, URINE: PREG TEST UR: NEGATIVE

## 2014-04-01 LAB — I-STAT CG4 LACTIC ACID, ED: LACTIC ACID, VENOUS: 1.55 mmol/L (ref 0.5–2.2)

## 2014-04-01 MED ORDER — SILVER SULFADIAZINE 1 % EX CREA: 1.0000 "application " | TOPICAL_CREAM | Freq: Every day | CUTANEOUS | Status: DC

## 2014-04-01 MED ORDER — LORAZEPAM 2 MG/ML IJ SOLN
1.0000 mg | Freq: Once | INTRAMUSCULAR | Status: AC
  Administered 2014-04-01: 1 mg via INTRAVENOUS
  Filled 2014-04-01: qty 1

## 2014-04-01 MED ORDER — DEXTROSE 5 % IV SOLN
1.0000 g | Freq: Once | INTRAVENOUS | Status: AC
  Administered 2014-04-01: 1 g via INTRAVENOUS
  Filled 2014-04-01: qty 10

## 2014-04-01 MED ORDER — SODIUM CHLORIDE 0.9 % IV BOLUS (SEPSIS)
1000.0000 mL | Freq: Once | INTRAVENOUS | Status: AC
  Administered 2014-04-01: 1000 mL via INTRAVENOUS

## 2014-04-01 MED ORDER — NITROFURANTOIN MONOHYD MACRO 100 MG PO CAPS: 100.0000 mg | ORAL_CAPSULE | Freq: Two times a day (BID) | ORAL | Status: DC

## 2014-04-01 NOTE — ED Notes (Signed)
MD at bedside. 
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
## 2014-04-01 NOTE — ED Notes (Signed)
Urine sent as a Add on

## 2014-04-01 NOTE — ED Notes (Signed)
Pt tearful when spoke to. PA aware

## 2014-04-01 NOTE — ED Notes (Signed)
Pt states that she noticed 2 blisters to her left had. Pt states that she believes that she has a UTI because of this. Pt states that she gets blisters when she has them. Pt denies burning, frequency.

## 2014-04-01 NOTE — ED Notes (Signed)
PA at bedside with NT to do in and out catheter.
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
## 2014-04-01 NOTE — ED Notes (Signed)
PA at bedside.

## 2014-04-02 ENCOUNTER — Encounter (HOSPITAL_COMMUNITY): Payer: Self-pay | Admitting: Emergency Medicine

## 2014-04-02 ENCOUNTER — Emergency Department (HOSPITAL_COMMUNITY): Payer: Medicare Other

## 2014-04-02 ENCOUNTER — Emergency Department (HOSPITAL_COMMUNITY)
Admission: EM | Admit: 2014-04-02 | Discharge: 2014-04-02 | Disposition: A | Discharge status: Home / Self Care | Payer: Medicare Other | Attending: Emergency Medicine | Admitting: Emergency Medicine

## 2014-04-02 DIAGNOSIS — R2232 Localized swelling, mass and lump, left upper limb: Secondary | ICD-10-CM | POA: Diagnosis present

## 2014-04-02 DIAGNOSIS — R238 Other skin changes: Secondary | ICD-10-CM | POA: Diagnosis not present

## 2014-04-02 DIAGNOSIS — Z8669 Personal history of other diseases of the nervous system and sense organs: Secondary | ICD-10-CM | POA: Diagnosis not present

## 2014-04-02 DIAGNOSIS — Z87448 Personal history of other diseases of urinary system: Secondary | ICD-10-CM | POA: Diagnosis not present

## 2014-04-02 DIAGNOSIS — Z91040 Latex allergy status: Secondary | ICD-10-CM | POA: Insufficient documentation

## 2014-04-02 DIAGNOSIS — M7989 Other specified soft tissue disorders: Secondary | ICD-10-CM | POA: Diagnosis not present

## 2014-04-02 MED ORDER — BACITRACIN ZINC 500 UNIT/GM EX OINT: 1.0000 "application " | TOPICAL_OINTMENT | Freq: Two times a day (BID) | CUTANEOUS | Status: DC

## 2014-04-02 MED ORDER — CLINDAMYCIN HCL 150 MG PO CAPS: 300.0000 mg | ORAL_CAPSULE | Freq: Four times a day (QID) | ORAL | Status: DC

## 2014-04-02 NOTE — ED Notes (Signed)
Patient seen earlier today for her left hand swelling.  Creme was given and now her hand is more swollen

## 2014-04-02 NOTE — ED Notes (Signed)
The pt is c/o swelling inher lt hand since this am. She was seen here earlier today for the same but the pt reports that her hand is still swelling.  The pt is sitting on the stretcher rocking back and forth with her head completely covered up.  She did not  Uncover her head the entire time i was in the room.  The female with her sat there with a smile on his face.  She reports no pain just swelling

## 2014-04-02 NOTE — Discharge Instructions (Signed)
You were seen today for blisters on her left hand. The cause is unknown. You need to followup with her primary doctor and potentially have dermatology biopsy the lesions. Stop Silvadene cream.  You'll be given an antibiotic and bacitracin ointment.  Blisters Blisters are fluid-filled sacs that form within the skin. Common causes of blistering are friction, burns, and exposure to irritating chemicals. The fluid in the blister protects the underlying damaged skin. Most of the time it is not recommended that you open blisters. When a blister is opened, there is an increased chance for infection. Usually, a blister will open on its own. They then dry up and peel off within 10 days. If the blister is tense and uncomfortable (painful) the fluid may be drained. If it is drained the roof of the blister should be left intact. The draining should only be done by a medical professional under aseptic conditions. Poorly fitting shoes and boots can cause blisters by being too tight or too loose. Wearing extra socks or using tape, bandages, or pads over the blister-prone area helps prevent the problem by reducing friction. Blisters heal more slowly if you have diabetes or if you have problems with your circulation. You need to be careful about medical follow-up to prevent infection. HOME CARE INSTRUCTIONS  Protect areas where blisters have formed until the skin is healed. Use a special bandage with a hole cut in the middle around the blister. This reduces pressure and friction. When the blister breaks, trim off the loose skin and keep the area clean by washing it with soap daily. Soaking the blister or broken-open blister with diluted vinegar twice daily for 15 minutes will dry it up and speed the healing. Use 3 tablespoons of white vinegar per quart of water (45 mL white vinegar per liter of water). An antibiotic ointment and a bandage can be used to cover the area after soaking.  SEEK MEDICAL CARE IF:   You develop  increased redness, pain, swelling, or drainage in the blistered area.  You develop a pus-like discharge from the blistered area, chills, or a fever. MAKE SURE YOU:   Understand these instructions.  Will watch your condition.  Will get help right away if you are not doing well or get worse. Document Released: 07/16/2004 Document Revised: 08/31/2011 Document Reviewed: 06/13/2008 [facility] Patient Information 2015 [place], [place]. This information is not intended to replace advice given to you by your health care provider. Make sure you discuss any questions you have with your health care provider.
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
## 2014-04-03 LAB — URINE CULTURE
CULTURE: NO GROWTH
Colony Count: NO GROWTH

## 2014-04-07 LAB — CULTURE, BLOOD (ROUTINE X 2)
CULTURE: NO GROWTH
Culture: NO GROWTH

## 2014-04-23 ENCOUNTER — Encounter (HOSPITAL_COMMUNITY): Payer: Self-pay | Admitting: Emergency Medicine

## 2014-05-29 ENCOUNTER — Telehealth: Payer: Self-pay | Admitting: Family Medicine

## 2014-05-29 NOTE — Telephone Encounter (Signed)
Having an extreme breakout of ecxema--with blisters Would like to have prescription cream called in

## 2014-05-31 NOTE — Telephone Encounter (Signed)
Pt called because she is having a very bad outbreak of eczema and would like to have something called in. jw

## 2014-05-31 NOTE — Telephone Encounter (Signed)
Will forward to MD. Shirel Mallis,CMA  

## 2014-06-01 MED ORDER — TRIAMCINOLONE ACETONIDE 0.5 % EX OINT: 1.0000 "application " | TOPICAL_OINTMENT | Freq: Two times a day (BID) | CUTANEOUS | Status: DC

## 2014-06-01 NOTE — Telephone Encounter (Signed)
Kenalog; but not to be applied to open sores..If these abnormal clinical findings persist, appropriate workup should be completed.  [facility], MD

## 2015-02-07 ENCOUNTER — Encounter: Payer: Medicare Other | Admitting: Family Medicine

## 2015-08-31 ENCOUNTER — Emergency Department (HOSPITAL_COMMUNITY)
Admission: EM | Admit: 2015-08-31 | Discharge: 2015-08-31 | Disposition: A | Discharge status: Home / Self Care | Payer: Medicare Other | Attending: Emergency Medicine | Admitting: Emergency Medicine

## 2015-08-31 DIAGNOSIS — J111 Influenza due to unidentified influenza virus with other respiratory manifestations: Secondary | ICD-10-CM | POA: Insufficient documentation

## 2015-08-31 NOTE — ED Notes (Signed)
Pt not in waiting room

## 2015-08-31 NOTE — ED Notes (Addendum)
Pt. Is very lethargic difficulty answer questions.  Pt. Is a paraplegic and family member believes it could be a UTI  Symptoms started approximately 4 days ago. PT. Is nauseated but has not vomited. Skin is very dry and her lips are dry.  Pt. Is alert and oriented X4.  Pt. Stated, "I took 2 Tylenol PM before I came"  Pt. Denies any fevers,

## 2015-08-31 NOTE — ED Notes (Signed)
Called for VS check, N/A.

## 2015-10-03 ENCOUNTER — Ambulatory Visit (INDEPENDENT_AMBULATORY_CARE_PROVIDER_SITE_OTHER): Payer: Medicare Other | Admitting: Family Medicine

## 2015-10-03 ENCOUNTER — Encounter: Payer: Self-pay | Admitting: Family Medicine

## 2015-10-03 ENCOUNTER — Telehealth: Payer: Self-pay | Admitting: Family Medicine

## 2015-10-03 VITALS — BP 129/61 | HR 102 | Temp 98.9°F

## 2015-10-03 DIAGNOSIS — M792 Neuralgia and neuritis, unspecified: Secondary | ICD-10-CM

## 2015-10-03 MED ORDER — PREGABALIN 75 MG PO CAPS: 75.0000 mg | ORAL_CAPSULE | Freq: Two times a day (BID) | ORAL | Status: DC

## 2015-10-03 MED ORDER — GABAPENTIN 100 MG PO CAPS: 100.0000 mg | ORAL_CAPSULE | Freq: Three times a day (TID) | ORAL | Status: DC

## 2015-10-03 NOTE — Progress Notes (Signed)
    Subjective   Tracey Hall is a 35 y.o. female that presents for a same day visit  1. Tingling: Four days ago, while transferring to the bed, she fell back on to the mattress. She initially had pain over her whole body. Pain has improved to where she is only having right sided shooting, sharp pain located in ear, face, back of neck and hand. She has tried ibuprofen 400mg  twice per day which did not help. Nothing has helped with her pain. Light touch makes pain worse.  ROS Per HPI  Social History  Substance Use Topics  . Smoking status: Never Smoker   . Smokeless tobacco: Never Used  . Alcohol Use: 0.5 oz/week    1 drink(s) per week     Comment: pt states she "drank today but not enough"    Allergies  Allergen Reactions  . Latex Rash    Objective   BP 129/61 mmHg  Pulse 102  Temp(Src) 98.9 F (37.2 C) (Oral)  General: Well appearing, no distress Skin: No obvious rashes on face or arm Neuro: Paresthesias of right face, neck, chest and right arm. Able to flex and extend bilateral elbows. Strength not evaluated. CN otherwise intact  Assessment and Plan   Neuropathic pain - Unsure of etiology. Shingles in differential, but distribution is odd. Will treat symptoms. - Plan: pregabalin (LYRICA) 75 MG capsule

## 2015-10-03 NOTE — Telephone Encounter (Signed)
Emergency Line / After Hours Call  Patient was seen today and given a prescription for lyrica. She is calling as the pharmacy is telling her the prescription needs to be authorized.   Called the pharmacy and they need a prior authorization. Will send in gabapentin instead.   Rosemarie Ax, MD PGY-3, [facility] 10/03/2015, 9:54 PM

## 2015-10-03 NOTE — Patient Instructions (Signed)
Thank you for coming to see me today. It was a pleasure. Today we talked about:   Tingling sensations: This sounds like nerve pain. I will prescribe a medication to help with this. Take this twice daily. I have included some information for you to look over. If you develop a rash over your face, please return immediately.  If you have any questions or concerns, please do not hesitate to call the office at [phone].  Sincerely,  Cordelia Poche, MD  Pregabalin capsules What is this medicine? PREGABALIN (pre GAB a lin) is used to treat nerve pain from diabetes, shingles, spinal cord injury, and fibromyalgia. It is also used to control seizures in epilepsy. This medicine may be used for other purposes; ask your health care provider or pharmacist if you have questions. What should I tell my health care provider before I take this medicine? They need to know if you have any of these conditions: -bleeding problems -heart disease, including heart failure -history of alcohol or drug abuse -kidney disease -suicidal thoughts, plans, or attempt; a previous suicide attempt by you or a family member -an unusual or allergic reaction to pregabalin, gabapentin, other medicines, foods, dyes, or preservatives -pregnant or trying to get pregnant or trying to conceive with your partner -breast-feeding How should I use this medicine? Take this medicine by mouth with a glass of water. Follow the directions on the prescription label. You can take this medicine with or without food. Take your doses at regular intervals. Do not take your medicine more often than directed. Do not stop taking except on your doctor's advice. A special MedGuide will be given to you by the pharmacist with each prescription and refill. Be sure to read this information carefully each time. Talk to your pediatrician regarding the use of this medicine in children. Special care may be needed. Overdosage: If you think you have taken too  much of this medicine contact a poison control center or emergency room at once. NOTE: This medicine is only for you. Do not share this medicine with others. What if I miss a dose? If you miss a dose, take it as soon as you can. If it is almost time for your next dose, take only that dose. Do not take double or extra doses. What may interact with this medicine? -alcohol -certain medicines for blood pressure like captopril, enalapril, or lisinopril -certain medicines for diabetes, like pioglitazone or rosiglitazone -certain medicines for anxiety or sleep -narcotic medicines for pain This list may not describe all possible interactions. Give your health care provider a list of all the medicines, herbs, non-prescription drugs, or dietary supplements you use. Also tell them if you smoke, drink alcohol, or use illegal drugs. Some items may interact with your medicine. What should I watch for while using this medicine? Tell your doctor or healthcare professional if your symptoms do not start to get better or if they get worse. Visit your doctor or health care professional for regular checks on your progress. Do not stop taking except on your doctor's advice. You may develop a severe reaction. Your doctor will tell you how much medicine to take. Wear a medical identification bracelet or chain if you are taking this medicine for seizures, and carry a card that describes your disease and details of your medicine and dosage times. You may get drowsy or dizzy. Do not drive, use machinery, or do anything that needs mental alertness until you know how this medicine affects you. Do not  stand or sit up quickly, especially if you are an older patient. This reduces the risk of dizzy or fainting spells. Alcohol may interfere with the effect of this medicine. Avoid alcoholic drinks. If you have a heart condition, like congestive heart failure, and notice that you are retaining water and have swelling in your hands or  feet, contact your health care provider immediately. The use of this medicine may increase the chance of suicidal thoughts or actions. Pay special attention to how you are responding while on this medicine. Any worsening of mood, or thoughts of suicide or dying should be reported to your health care professional right away. This medicine has caused reduced sperm counts in some men. This may interfere with the ability to father a child. You should talk to your doctor or health care professional if you are concerned about your fertility. Women who become pregnant while using this medicine for seizures may enroll in the [place] Pregnancy Registry by calling [phone]. This registry collects information about the safety of antiepileptic drug use during pregnancy. What side effects may I notice from receiving this medicine? Side effects that you should report to your doctor or health care professional as soon as possible: -allergic reactions like skin rash, itching or hives, swelling of the face, lips, or tongue -breathing problems -changes in vision -chest pain -confusion -jerking or unusual movements of any part of your body -loss of memory -muscle pain, tenderness, or weakness -suicidal thoughts or other mood changes -swelling of the ankles, feet, hands -unusual bruising or bleeding Side effects that usually do not require medical attention (Report these to your doctor or health care professional if they continue or are bothersome.): -dizziness -drowsiness -dry mouth -headache -nausea -tremors -trouble sleeping -weight gain This list may not describe all possible side effects. Call your doctor for medical advice about side effects. You may report side effects to FDA at 1-800-FDA-1088. Where should I keep my medicine? Keep out of the reach of children. This medicine can be abused. Keep your medicine in a safe place to protect it from theft. Do not share this  medicine with anyone. Selling or giving away this medicine is dangerous and against the law. This medicine may cause accidental overdose and death if it taken by other adults, children, or pets. Mix any unused medicine with a substance like cat litter or coffee grounds. Then throw the medicine away in a sealed container like a sealed bag or a coffee can with a lid. Do not use the medicine after the expiration date. Store at room temperature between 15 and 30 degrees C (59 and 86 degrees F). NOTE: This sheet is a summary. It may not cover all possible information. If you have questions about this medicine, talk to your doctor, pharmacist, or health care provider.    2016, Elsevier/Gold Standard. (2013-08-04 15:38:53)

## 2015-12-02 ENCOUNTER — Encounter (HOSPITAL_COMMUNITY): Payer: Self-pay | Admitting: Emergency Medicine

## 2015-12-02 DIAGNOSIS — R21 Rash and other nonspecific skin eruption: Secondary | ICD-10-CM | POA: Insufficient documentation

## 2015-12-02 DIAGNOSIS — Z5321 Procedure and treatment not carried out due to patient leaving prior to being seen by health care provider: Secondary | ICD-10-CM | POA: Diagnosis not present

## 2015-12-02 NOTE — ED Notes (Signed)
Pt st's she has had a rash with blisters on her right hand for approx 1 month.  St's approx 4 hours ago she started getting the same type rash on her back and sides,.

## 2015-12-03 ENCOUNTER — Emergency Department (HOSPITAL_COMMUNITY)
Admission: EM | Admit: 2015-12-03 | Discharge: 2015-12-03 | Discharge status: Against Medical Advice | Payer: Medicare Other | Attending: Dermatology | Admitting: Dermatology

## 2015-12-03 NOTE — ED Notes (Signed)
Called X2

## 2015-12-03 NOTE — ED Notes (Signed)
Pt called for in waiting area X3 for vital sign reassessment. NO ANSWER

## 2015-12-31 ENCOUNTER — Encounter: Payer: Self-pay | Admitting: Family Medicine

## 2015-12-31 NOTE — Progress Notes (Unsigned)
Filled out statement medical necessity for adult incontinence supplis and faxed to [place]. Dx of quadriplegia.

## 2016-01-24 ENCOUNTER — Telehealth: Payer: Self-pay | Admitting: Family Medicine

## 2016-01-24 NOTE — Telephone Encounter (Signed)
Left voice message at number provided for patient to call for an appointment or speak with nurse.  Derl Barrow, RN

## 2016-01-24 NOTE — Telephone Encounter (Signed)
Pt stated [facility] faxed [place] this week needing a signature in order for her to get her incontinence supplies. Please Advise.  Pt also stated she has been doing some research and believes she is symptomatic. Pt would like the nurse to call her and discuss this. Please Advise. ep

## 2016-01-24 NOTE — Telephone Encounter (Signed)
Will forward to triage. Jazmin Hartsell,CMA

## 2016-01-29 NOTE — Telephone Encounter (Signed)
Tracey Hall with [facility] called and states that she was unable to pull up Dr. Shawna Orleans in their resident list for orders.  She will need Dr. Nori Riis to sign this order and fax back.  Will forward to both PCP and Dr. Nori Riis.  Form placed in Dr. Verlon Au box for signature. Royce Stegman,CMA

## 2016-02-05 ENCOUNTER — Telehealth: Payer: Self-pay | Admitting: Family Medicine

## 2016-02-05 NOTE — Telephone Encounter (Signed)
Needs a new RX for her wheelchair.  whats the procedure to get one.  She says it is completely broken. She also has questions about blisters on her body

## 2016-02-07 ENCOUNTER — Other Ambulatory Visit: Payer: Self-pay | Admitting: Family Medicine

## 2016-02-07 DIAGNOSIS — G8250 Quadriplegia, unspecified: Secondary | ICD-10-CM

## 2016-02-07 NOTE — Addendum Note (Signed)
Addended by: Bufford Lope on: 02/07/2016 05:38 PM   Modules accepted: Orders

## 2016-02-07 NOTE — Progress Notes (Addendum)
Wheelchair Rx cannot be sent, since patient will need to make an appt for skin blister - will discuss at that time.

## 2016-02-07 NOTE — Telephone Encounter (Signed)
Rx for wheelchair sent. Please ask patient to make appt to talk about blisters.

## 2016-02-10 NOTE — Telephone Encounter (Signed)
Called pt unable to reach anyone and no option for VM. If pt calls back please help her in scheduling an apt to see Dr. Shawna Orleans.

## 2016-02-14 ENCOUNTER — Other Ambulatory Visit: Payer: Self-pay | Admitting: Family Medicine

## 2016-02-14 MED ORDER — CEPHALEXIN 500 MG PO CAPS: ORAL_CAPSULE | ORAL | 0 refills | Status: DC

## 2016-02-14 NOTE — Progress Notes (Signed)
Pt calling w concern for UTI. Has neurogenic bladder---has had several uti in past and they "alwas start with blisters". No fever. Paraplegic. Unclear what 'blisters" she is talking about but she is adamant this is her symptom complex- It is 16;20 on a Friday so soonest we could see her is Monday so I will err on side of empiric treatment and she will f/u if not improving.

## 2016-02-17 NOTE — Telephone Encounter (Signed)
Attempted to call patient to tell her to make appointment, no response on cell and VM left on home phone. Spoke with [facility] and asked staff to fax over patient's demographics and last clinic note to see if electric wheelchair can be repaired.

## 2016-04-23 ENCOUNTER — Ambulatory Visit (INDEPENDENT_AMBULATORY_CARE_PROVIDER_SITE_OTHER): Payer: Medicare Other | Admitting: Family Medicine

## 2016-04-23 ENCOUNTER — Telehealth: Payer: Self-pay | Admitting: *Deleted

## 2016-04-23 ENCOUNTER — Encounter: Payer: Self-pay | Admitting: Family Medicine

## 2016-04-23 VITALS — BP 100/68 | HR 78 | Temp 98.3°F

## 2016-04-23 DIAGNOSIS — T7840XA Allergy, unspecified, initial encounter: Secondary | ICD-10-CM

## 2016-04-23 DIAGNOSIS — M62838 Other muscle spasm: Secondary | ICD-10-CM | POA: Diagnosis not present

## 2016-04-23 DIAGNOSIS — G8250 Quadriplegia, unspecified: Secondary | ICD-10-CM

## 2016-04-23 MED ORDER — BACLOFEN 20 MG PO TABS: 20.0000 mg | ORAL_TABLET | Freq: Two times a day (BID) | ORAL | 0 refills | Status: DC | PRN

## 2016-04-23 MED ORDER — GABAPENTIN 300 MG PO CAPS: 300.0000 mg | ORAL_CAPSULE | Freq: Two times a day (BID) | ORAL | 1 refills | Status: DC

## 2016-04-23 NOTE — Telephone Encounter (Signed)
LMOVM asking pt to return call.  Pt left before having her labs drawn.  Please have her schedule an Lab visit ASAP.  Romina Divirgilio, Salome Spotted, CMA

## 2016-04-23 NOTE — Patient Instructions (Addendum)
Take the gabapentin every night at bedtime for 3 days, if you are tolerating this, you can go up to twice daily on this medication.  Take the Baclofen as needed for spasm.  Both of these medications will cause you to be sleepy.     Muscle Cramps and Spasms Muscle cramps and spasms occur when a muscle or muscles tighten and you have no control over this tightening (involuntary muscle contraction). They are a common problem and can develop in any muscle. The most common place is in the calf muscles of the leg. Both muscle cramps and muscle spasms are involuntary muscle contractions, but they also have differences:   Muscle cramps are sporadic and painful. They may last a few seconds to a quarter of an hour. Muscle cramps are often more forceful and last longer than muscle spasms.  Muscle spasms may or may not be painful. They may also last just a few seconds or much longer. CAUSES  It is uncommon for cramps or spasms to be due to a serious underlying problem. In many cases, the cause of cramps or spasms is unknown. Some common causes are:   Overexertion.   Overuse from repetitive motions (doing the same thing over and over).   Remaining in a certain position for a long period of time.   Improper preparation, form, or technique while performing a sport or activity.   Dehydration.   Injury.   Side effects of some medicines.   Abnormally low levels of the salts and ions in your blood (electrolytes), especially potassium and calcium. This could happen if you are taking water pills (diuretics) or you are pregnant.  Some underlying medical problems can make it more likely to develop cramps or spasms. These include, but are not limited to:   Diabetes.   Parkinson disease.   Hormone disorders, such as thyroid problems.   Alcohol abuse.   Diseases specific to muscles, joints, and bones.   Blood vessel disease where not enough blood is getting to the muscles.  HOME CARE  INSTRUCTIONS   Stay well hydrated. Drink enough water and fluids to keep your urine clear or pale yellow.  It may be helpful to massage, stretch, and relax the affected muscle.  For tight or tense muscles, use a warm towel, heating pad, or hot shower water directed to the affected area.  If you are sore or have pain after a cramp or spasm, applying ice to the affected area may relieve discomfort.  Put ice in a plastic bag.  Place a towel between your skin and the bag.  Leave the ice on for 15-20 minutes, 03-04 times a day.  Medicines used to treat a known cause of cramps or spasms may help reduce their frequency or severity. Only take over-the-counter or prescription medicines as directed by your caregiver. SEEK MEDICAL CARE IF:  Your cramps or spasms get more severe, more frequent, or do not improve over time.  MAKE SURE YOU:   Understand these instructions.  Will watch your condition.  Will get help right away if you are not doing well or get worse.   This information is not intended to replace advice given to you by your health care provider. Make sure you discuss any questions you have with your health care provider.   Document Released: 11/28/2001 Document Revised: 10/03/2012 Document Reviewed: 05/25/2012 Elsevier Interactive Patient Education [employer].
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
## 2016-04-28 NOTE — Progress Notes (Signed)
Please call patient again to remind her she was to complete this at last visit.

## 2016-06-20 ENCOUNTER — Other Ambulatory Visit: Payer: Self-pay | Admitting: Family Medicine

## 2016-07-29 ENCOUNTER — Ambulatory Visit: Payer: Medicare Other | Admitting: Physical Therapy

## 2016-07-29 ENCOUNTER — Ambulatory Visit: Payer: Medicare Other | Attending: Family Medicine | Admitting: Physical Therapy

## 2016-07-29 DIAGNOSIS — G8253 Quadriplegia, C5-C7 complete: Secondary | ICD-10-CM | POA: Insufficient documentation

## 2016-07-29 DIAGNOSIS — M6281 Muscle weakness (generalized): Secondary | ICD-10-CM | POA: Diagnosis not present

## 2016-07-29 NOTE — Therapy (Signed)
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 impairments:  Decreased strength, Impaired tone, Decreased mobility  Visit Diagnosis: Quadriplegia, C5-C7 complete ([place]) - Plan: PT plan of care cert/re-cert  Muscle weakness (generalized) - Plan: PT plan of care cert/re-cert      G-Codes - 66/06/30 1934    Functional Assessment Tool Used pt has quadriplegia - requires power wheelchair for independence with mobility   Functional Limitation Mobility: Walking and moving around   Mobility: Walking and Moving Around Current Status [phone]) At least 80 percent but less than 100 percent impaired, limited or restricted   Mobility: Walking and Moving Around Goal Status [phone]) At least 80 percent but less than 100 percent impaired, limited or restricted   Mobility: Walking and Moving Around Discharge Status [phone]) At least 80 percent but less than 100 percent impaired, limited or restricted       Problem List Patient Active Problem List   Diagnosis Date Noted  . Cough 02/19/2012  .  Weight gain 02/19/2012  . Obesity 02/19/2012  . 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 Recurrent UTI 09/15/2010  . Asthma 09/15/2010  . ANXIETY DISORDER 01/10/2010  . 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 ANEMIA NOS 12/06/2006  . DEPRESSION, MAJOR, RECURRENT 08/19/2006  .  Quadriplegia ([place]) 08/19/2006    Rechy Bost, [place], PT 07/29/2016, 7:39 PM  [facility] [address] [place], [place], [address] Phone: [phone]   Fax:  [phone]  Name: KRISTAL PERL MRN: [id number] Date of Birth: Oct 10, 1980

## 2016-08-27 ENCOUNTER — Other Ambulatory Visit: Payer: Self-pay | Admitting: Family Medicine

## 2016-09-02 ENCOUNTER — Encounter: Payer: Self-pay | Admitting: Family Medicine

## 2016-09-02 ENCOUNTER — Ambulatory Visit (INDEPENDENT_AMBULATORY_CARE_PROVIDER_SITE_OTHER): Payer: Medicare Other | Admitting: Family Medicine

## 2016-09-02 VITALS — BP 100/69 | HR 120 | Temp 98.3°F

## 2016-09-02 DIAGNOSIS — Z993 Dependence on wheelchair: Secondary | ICD-10-CM

## 2016-09-02 DIAGNOSIS — G8250 Quadriplegia, unspecified: Secondary | ICD-10-CM
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
## 2016-09-02 NOTE — Patient Instructions (Addendum)
It was good to meet you today for your wheelchair face-to-face encounter.  For your lightheadedness, please schedule a visit with me to discuss further.  Get help right away if:  You have chest pain.  You have a fast or irregular heartbeat.  You lose feeling (get numbness) in any part of your body.  You cannot move your arms or your legs.  You have trouble talking.  You get sweaty or feel light-headed.  You faint.  You have trouble breathing.  You have trouble staying awake.  You feel confused.   Take care and seek immediate care sooner if you develop any concerns.   Dr. Bufford Lope, [place]

## 2016-09-04 ENCOUNTER — Telehealth: Payer: Self-pay | Admitting: Family Medicine

## 2016-09-04 NOTE — Telephone Encounter (Signed)
Left message for patient inquiring how she was doing with her lightheadedness since I saw she did not make an appointment to discuss.

## 2016-09-14 ENCOUNTER — Telehealth: Payer: Self-pay | Admitting: Family Medicine

## 2016-09-14 NOTE — Telephone Encounter (Signed)
[facility] forms dropped off for at front desk for completion.  Verified that patient section of form has been completed.  Last DOS/WCC with PCP was 09/02/2016.  Placed form in team folder to be completed by clinical staff.  Crista Luria

## 2016-09-14 NOTE — Telephone Encounter (Signed)
Form placed in Dr. Verlon Au box to be cosigned per the sticky notes attached to paperwork. Katharina Caper, April D, [place]

## 2016-09-14 NOTE — Telephone Encounter (Signed)
Clinical info completed on [facility] form.  Place form in Dr. Lora Havens box for completion.  Andreas Newport, [place]

## 2016-09-14 NOTE — Telephone Encounter (Signed)
[facility] form dropped off for at front desk for completion.  Verified that patient section of form has been completed.  Last DOS/WCC with PCP was 09/02/2016.  Placed form in team folder to be completed by clinical staff.  Crista Luria

## 2016-09-14 NOTE — Telephone Encounter (Signed)
Completed and placed in the fax pile to be sent to [facility].

## 2016-09-15 ENCOUNTER — Emergency Department (HOSPITAL_COMMUNITY): Payer: Medicare Other

## 2016-09-15 ENCOUNTER — Inpatient Hospital Stay (HOSPITAL_COMMUNITY)
Admission: EM | Admit: 2016-09-15 | Discharge: 2016-09-18 | DRG: 871 | Discharge status: Against Medical Advice | Payer: Medicare Other | Attending: Internal Medicine | Admitting: Internal Medicine

## 2016-09-15 ENCOUNTER — Encounter (HOSPITAL_COMMUNITY): Payer: Self-pay | Admitting: Family Medicine

## 2016-09-15 ENCOUNTER — Emergency Department (HOSPITAL_COMMUNITY)
Admission: EM | Admit: 2016-09-15 | Discharge: 2016-09-15 | Discharge status: Against Medical Advice | Payer: Medicare Other

## 2016-09-15 DIAGNOSIS — R000 Tachycardia, unspecified: Secondary | ICD-10-CM | POA: Diagnosis present

## 2016-09-15 DIAGNOSIS — D649 Anemia, unspecified: Secondary | ICD-10-CM | POA: Diagnosis present

## 2016-09-15 DIAGNOSIS — E871 Hypo-osmolality and hyponatremia: Secondary | ICD-10-CM | POA: Diagnosis present

## 2016-09-15 DIAGNOSIS — N12 Tubulo-interstitial nephritis, not specified as acute or chronic: Secondary | ICD-10-CM | POA: Diagnosis present

## 2016-09-15 DIAGNOSIS — Z95828 Presence of other vascular implants and grafts: Secondary | ICD-10-CM

## 2016-09-15 DIAGNOSIS — A4151 Sepsis due to Escherichia coli [E. coli]: Secondary | ICD-10-CM | POA: Diagnosis present

## 2016-09-15 DIAGNOSIS — R1084 Generalized abdominal pain: Secondary | ICD-10-CM | POA: Diagnosis not present

## 2016-09-15 DIAGNOSIS — G8250 Quadriplegia, unspecified: Secondary | ICD-10-CM | POA: Diagnosis not present

## 2016-09-15 DIAGNOSIS — Z0389 Encounter for observation for other suspected diseases and conditions ruled out: Secondary | ICD-10-CM | POA: Diagnosis not present

## 2016-09-15 DIAGNOSIS — N319 Neuromuscular dysfunction of bladder, unspecified: Secondary | ICD-10-CM | POA: Diagnosis present

## 2016-09-15 DIAGNOSIS — Z79899 Other long term (current) drug therapy: Secondary | ICD-10-CM

## 2016-09-15 DIAGNOSIS — A419 Sepsis, unspecified organism: Secondary | ICD-10-CM | POA: Diagnosis not present

## 2016-09-15 DIAGNOSIS — Z91040 Latex allergy status: Secondary | ICD-10-CM

## 2016-09-15 DIAGNOSIS — N3000 Acute cystitis without hematuria: Secondary | ICD-10-CM | POA: Diagnosis not present

## 2016-09-15 DIAGNOSIS — J45909 Unspecified asthma, uncomplicated: Secondary | ICD-10-CM | POA: Diagnosis present

## 2016-09-15 DIAGNOSIS — I959 Hypotension, unspecified: Secondary | ICD-10-CM | POA: Diagnosis present

## 2016-09-15 DIAGNOSIS — D509 Iron deficiency anemia, unspecified: Secondary | ICD-10-CM | POA: Diagnosis present

## 2016-09-15 DIAGNOSIS — N179 Acute kidney failure, unspecified: Secondary | ICD-10-CM | POA: Diagnosis not present

## 2016-09-15 DIAGNOSIS — Z87440 Personal history of urinary (tract) infections: Secondary | ICD-10-CM

## 2016-09-15 DIAGNOSIS — E669 Obesity, unspecified: Secondary | ICD-10-CM | POA: Diagnosis present

## 2016-09-15 DIAGNOSIS — R079 Chest pain, unspecified: Secondary | ICD-10-CM | POA: Diagnosis not present

## 2016-09-15 DIAGNOSIS — R9431 Abnormal electrocardiogram [ECG] [EKG]: Secondary | ICD-10-CM | POA: Diagnosis present

## 2016-09-15 DIAGNOSIS — I4581 Long QT syndrome: Secondary | ICD-10-CM | POA: Diagnosis present

## 2016-09-15 DIAGNOSIS — R7989 Other specified abnormal findings of blood chemistry: Secondary | ICD-10-CM | POA: Diagnosis present

## 2016-09-15 DIAGNOSIS — E876 Hypokalemia: Secondary | ICD-10-CM | POA: Diagnosis present

## 2016-09-15 DIAGNOSIS — L8990 Pressure ulcer of unspecified site, unspecified stage: Secondary | ICD-10-CM | POA: Insufficient documentation

## 2016-09-15 DIAGNOSIS — Z823 Family history of stroke: Secondary | ICD-10-CM

## 2016-09-15 DIAGNOSIS — Z6836 Body mass index (BMI) 36.0-36.9, adult: Secondary | ICD-10-CM

## 2016-09-15 DIAGNOSIS — N10 Acute tubulo-interstitial nephritis: Secondary | ICD-10-CM | POA: Diagnosis present

## 2016-09-15 DIAGNOSIS — Z993 Dependence on wheelchair: Secondary | ICD-10-CM

## 2016-09-15 DIAGNOSIS — L89892 Pressure ulcer of other site, stage 2: Secondary | ICD-10-CM | POA: Diagnosis present

## 2016-09-15 LAB — COMPREHENSIVE METABOLIC PANEL
ALT: 31 U/L (ref 14–54)
AST: 43 U/L — AB (ref 15–41)
Albumin: 3.2 g/dL — ABNORMAL LOW (ref 3.5–5.0)
Alkaline Phosphatase: 61 U/L (ref 38–126)
Anion gap: 12 (ref 5–15)
BUN: 17 mg/dL (ref 6–20)
CO2: 19 mmol/L — AB (ref 22–32)
CREATININE: 1.26 mg/dL — AB (ref 0.44–1.00)
Calcium: 8.3 mg/dL — ABNORMAL LOW (ref 8.9–10.3)
Chloride: 103 mmol/L (ref 101–111)
GFR calc Af Amer: >60 mL/min (ref >60)
GFR calc non Af Amer: 55 mL/min — ABNORMAL LOW (ref >60)
Glucose, Bld: 91 mg/dL (ref 65–99)
POTASSIUM: 3 mmol/L — AB (ref 3.5–5.1)
Sodium: 134 mmol/L — ABNORMAL LOW (ref 135–145)
Total Bilirubin: 0.8 mg/dL (ref 0.3–1.2)
Total Protein: 7.4 g/dL (ref 6.5–8.1)

## 2016-09-15 LAB — LIPASE, BLOOD: LIPASE: 12 U/L (ref 11–51)

## 2016-09-15 LAB — CBC
HEMATOCRIT: 22.6 % — AB (ref 36.0–46.0)
Hemoglobin: 6.9 g/dL — CL (ref 12.0–15.0)
MCH: 19.4 pg — AB (ref 26.0–34.0)
MCHC: 30.5 g/dL (ref 30.0–36.0)
MCV: 63.7 fL — AB (ref 78.0–100.0)
Platelets: 702 10*3/uL — ABNORMAL HIGH (ref 150–400)
RBC: 3.55 MIL/uL — ABNORMAL LOW (ref 3.87–5.11)
RDW: 18.7 % — AB (ref 11.5–15.5)
WBC: 28.3 10*3/uL — AB (ref 4.0–10.5)

## 2016-09-15 MED ORDER — SODIUM CHLORIDE 0.9 % IV BOLUS (SEPSIS)
1000.0000 mL | Freq: Once | INTRAVENOUS | Status: AC
  Administered 2016-09-16: 1000 mL via INTRAVENOUS

## 2016-09-15 MED ORDER — SODIUM CHLORIDE 0.9 % IV BOLUS (SEPSIS)
1000.0000 mL | Freq: Once | INTRAVENOUS | Status: AC
Start: 2016-09-15 — End: 2016-09-16
  Administered 2016-09-15: 1000 mL via INTRAVENOUS

## 2016-09-15 MED ORDER — PIPERACILLIN-TAZOBACTAM 3.375 G IVPB 30 MIN
3.3750 g | Freq: Once | INTRAVENOUS | Status: AC
  Administered 2016-09-16: 3.375 g via INTRAVENOUS
  Filled 2016-09-15: qty 50

## 2016-09-15 MED ORDER — METOCLOPRAMIDE HCL 5 MG/ML IJ SOLN
10.0000 mg | Freq: Once | INTRAMUSCULAR | Status: AC
  Administered 2016-09-15: 10 mg via INTRAVENOUS
  Filled 2016-09-15: qty 2

## 2016-09-15 MED ORDER — FENTANYL CITRATE (PF) 100 MCG/2ML IJ SOLN
50.0000 ug | INTRAMUSCULAR | Status: DC | PRN
  Administered 2016-09-15: 50 ug via INTRAVENOUS
  Filled 2016-09-15: qty 2

## 2016-09-15 MED ORDER — ONDANSETRON HCL 4 MG/2ML IJ SOLN
4.0000 mg | Freq: Once | INTRAMUSCULAR | Status: AC | PRN
  Administered 2016-09-15: 4 mg via INTRAVENOUS
  Filled 2016-09-15: qty 2

## 2016-09-15 MED ORDER — VANCOMYCIN HCL IN DEXTROSE 1-5 GM/200ML-% IV SOLN
1000.0000 mg | Freq: Once | INTRAVENOUS | Status: AC
  Administered 2016-09-16: 1000 mg via INTRAVENOUS
  Filled 2016-09-15: qty 200

## 2016-09-15 MED ORDER — ACETAMINOPHEN 500 MG PO TABS
1000.0000 mg | ORAL_TABLET | Freq: Once | ORAL | Status: AC
  Administered 2016-09-16: 1000 mg via ORAL
  Filled 2016-09-15: qty 2

## 2016-09-15 NOTE — ED Triage Notes (Signed)
Patient is complaining of generalized abd pain that started 3 days ago. Pt relates pain maybe related to urinary tract infection. Pt reports she feels like she has not been urinating that often. Complains of nausea.
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
## 2016-09-16 ENCOUNTER — Encounter (HOSPITAL_COMMUNITY): Payer: Self-pay | Admitting: Family Medicine

## 2016-09-16 DIAGNOSIS — N319 Neuromuscular dysfunction of bladder, unspecified: Secondary | ICD-10-CM | POA: Diagnosis present

## 2016-09-16 DIAGNOSIS — I4581 Long QT syndrome: Secondary | ICD-10-CM | POA: Diagnosis present

## 2016-09-16 DIAGNOSIS — A419 Sepsis, unspecified organism: Secondary | ICD-10-CM | POA: Diagnosis not present

## 2016-09-16 DIAGNOSIS — R000 Tachycardia, unspecified: Secondary | ICD-10-CM | POA: Diagnosis present

## 2016-09-16 DIAGNOSIS — N3000 Acute cystitis without hematuria: Secondary | ICD-10-CM | POA: Diagnosis not present

## 2016-09-16 DIAGNOSIS — E876 Hypokalemia: Secondary | ICD-10-CM | POA: Diagnosis present

## 2016-09-16 DIAGNOSIS — L8990 Pressure ulcer of unspecified site, unspecified stage: Secondary | ICD-10-CM | POA: Insufficient documentation

## 2016-09-16 DIAGNOSIS — Z79899 Other long term (current) drug therapy: Secondary | ICD-10-CM | POA: Diagnosis not present

## 2016-09-16 DIAGNOSIS — D649 Anemia, unspecified: Secondary | ICD-10-CM | POA: Diagnosis not present

## 2016-09-16 DIAGNOSIS — N179 Acute kidney failure, unspecified: Secondary | ICD-10-CM | POA: Diagnosis not present

## 2016-09-16 DIAGNOSIS — E871 Hypo-osmolality and hyponatremia: Secondary | ICD-10-CM | POA: Diagnosis not present

## 2016-09-16 DIAGNOSIS — Z993 Dependence on wheelchair: Secondary | ICD-10-CM | POA: Diagnosis not present

## 2016-09-16 DIAGNOSIS — J45909 Unspecified asthma, uncomplicated: Secondary | ICD-10-CM | POA: Diagnosis present

## 2016-09-16 DIAGNOSIS — R9431 Abnormal electrocardiogram [ECG] [EKG]: Secondary | ICD-10-CM | POA: Diagnosis present

## 2016-09-16 DIAGNOSIS — Z823 Family history of stroke: Secondary | ICD-10-CM | POA: Diagnosis not present

## 2016-09-16 DIAGNOSIS — A4151 Sepsis due to Escherichia coli [E. coli]: Secondary | ICD-10-CM | POA: Diagnosis not present

## 2016-09-16 DIAGNOSIS — Z6836 Body mass index (BMI) 36.0-36.9, adult: Secondary | ICD-10-CM | POA: Diagnosis not present

## 2016-09-16 DIAGNOSIS — I959 Hypotension, unspecified: Secondary | ICD-10-CM | POA: Diagnosis present

## 2016-09-16 DIAGNOSIS — E669 Obesity, unspecified: Secondary | ICD-10-CM | POA: Diagnosis present

## 2016-09-16 DIAGNOSIS — N10 Acute tubulo-interstitial nephritis: Secondary | ICD-10-CM | POA: Diagnosis not present

## 2016-09-16 DIAGNOSIS — N390 Urinary tract infection, site not specified: Secondary | ICD-10-CM | POA: Diagnosis not present

## 2016-09-16 DIAGNOSIS — N12 Tubulo-interstitial nephritis, not specified as acute or chronic: Secondary | ICD-10-CM | POA: Diagnosis present

## 2016-09-16 DIAGNOSIS — G8250 Quadriplegia, unspecified: Secondary | ICD-10-CM | POA: Diagnosis present

## 2016-09-16 DIAGNOSIS — Z87440 Personal history of urinary (tract) infections: Secondary | ICD-10-CM | POA: Diagnosis not present

## 2016-09-16 DIAGNOSIS — R1084 Generalized abdominal pain: Secondary | ICD-10-CM | POA: Diagnosis not present

## 2016-09-16 DIAGNOSIS — Z91040 Latex allergy status: Secondary | ICD-10-CM | POA: Diagnosis not present

## 2016-09-16 DIAGNOSIS — Z95828 Presence of other vascular implants and grafts: Secondary | ICD-10-CM | POA: Diagnosis not present

## 2016-09-16 DIAGNOSIS — D509 Iron deficiency anemia, unspecified: Secondary | ICD-10-CM | POA: Diagnosis present

## 2016-09-16 DIAGNOSIS — B9620 Unspecified Escherichia coli [E. coli] as the cause of diseases classified elsewhere: Secondary | ICD-10-CM | POA: Diagnosis not present

## 2016-09-16 LAB — URINALYSIS, ROUTINE W REFLEX MICROSCOPIC
Bilirubin Urine: NEGATIVE
GLUCOSE, UA: NEGATIVE mg/dL
Hgb urine dipstick: NEGATIVE
Ketones, ur: NEGATIVE mg/dL
NITRITE: NEGATIVE
Protein, ur: 30 mg/dL — AB
Specific Gravity, Urine: 1.016 (ref 1.005–1.030)
pH: 5 (ref 5.0–8.0)

## 2016-09-16 LAB — CBC
HEMATOCRIT: 20.3 % — AB (ref 36.0–46.0)
HEMOGLOBIN: 6.3 g/dL — AB (ref 12.0–15.0)
MCH: 19.9 pg — ABNORMAL LOW (ref 26.0–34.0)
MCHC: 31 g/dL (ref 30.0–36.0)
MCV: 64 fL — ABNORMAL LOW (ref 78.0–100.0)
Platelets: 694 10*3/uL — ABNORMAL HIGH (ref 150–400)
RBC: 3.17 MIL/uL — ABNORMAL LOW (ref 3.87–5.11)
RDW: 18.8 % — AB (ref 11.5–15.5)
WBC: 28.8 10*3/uL — AB (ref 4.0–10.5)

## 2016-09-16 LAB — COMPREHENSIVE METABOLIC PANEL
ALT: 29 U/L (ref 14–54)
ANION GAP: 6 (ref 5–15)
AST: 31 U/L (ref 15–41)
Albumin: 2.7 g/dL — ABNORMAL LOW (ref 3.5–5.0)
Alkaline Phosphatase: 57 U/L (ref 38–126)
BILIRUBIN TOTAL: 0.7 mg/dL (ref 0.3–1.2)
BUN: 14 mg/dL (ref 6–20)
CO2: 21 mmol/L — ABNORMAL LOW (ref 22–32)
Calcium: 7.4 mg/dL — ABNORMAL LOW (ref 8.9–10.3)
Chloride: 109 mmol/L (ref 101–111)
Creatinine, Ser: 0.82 mg/dL (ref 0.44–1.00)
GFR calc Af Amer: >60 mL/min (ref >60)
Glucose, Bld: 102 mg/dL — ABNORMAL HIGH (ref 65–99)
POTASSIUM: 2.8 mmol/L — AB (ref 3.5–5.1)
Sodium: 136 mmol/L (ref 135–145)
TOTAL PROTEIN: 6.6 g/dL (ref 6.5–8.1)

## 2016-09-16 LAB — OSMOLALITY: OSMOLALITY: 284 mosm/kg (ref 275–295)

## 2016-09-16 LAB — SODIUM, URINE, RANDOM: Sodium, Ur: 28 mmol/L

## 2016-09-16 LAB — IRON AND TIBC
Iron: <5 ug/dL — ABNORMAL LOW (ref 28–170)
TIBC: 256 ug/dL (ref 250–450)

## 2016-09-16 LAB — MRSA PCR SCREENING: MRSA by PCR: NEGATIVE

## 2016-09-16 LAB — CREATININE, URINE, RANDOM: Creatinine, Urine: 94.82 mg/dL

## 2016-09-16 LAB — PREPARE RBC (CROSSMATCH)

## 2016-09-16 LAB — OSMOLALITY, URINE: Osmolality, Ur: 279 mOsm/kg — ABNORMAL LOW (ref 300–900)

## 2016-09-16 LAB — HEMOGLOBIN AND HEMATOCRIT, BLOOD
HEMATOCRIT: 25.3 % — AB (ref 36.0–46.0)
HEMOGLOBIN: 8 g/dL — AB (ref 12.0–15.0)

## 2016-09-16 LAB — ABO/RH: ABO/RH(D): O POS

## 2016-09-16 LAB — I-STAT CG4 LACTIC ACID, ED: Lactic Acid, Venous: 1.73 mmol/L (ref 0.5–1.9)

## 2016-09-16 LAB — MAGNESIUM: Magnesium: 1.4 mg/dL — ABNORMAL LOW (ref 1.7–2.4)

## 2016-09-16 LAB — POC URINE PREG, ED: Preg Test, Ur: NEGATIVE

## 2016-09-16 LAB — FERRITIN: FERRITIN: 114 ng/mL (ref 11–307)

## 2016-09-16 MED ORDER — MAGNESIUM SULFATE 2 GM/50ML IV SOLN
2.0000 g | Freq: Once | INTRAVENOUS | Status: AC
  Administered 2016-09-16: 2 g via INTRAVENOUS
  Filled 2016-09-16: qty 50

## 2016-09-16 MED ORDER — MORPHINE SULFATE (PF) 4 MG/ML IV SOLN
1.0000 mg | INTRAVENOUS | Status: DC | PRN
  Administered 2016-09-16 – 2016-09-18 (×8): 1 mg via INTRAVENOUS
  Filled 2016-09-16 (×9): qty 1

## 2016-09-16 MED ORDER — POTASSIUM CHLORIDE 2 MEQ/ML IV SOLN: 30.0000 meq | Freq: Once | INTRAVENOUS | Status: DC

## 2016-09-16 MED ORDER — DEXTROSE 5 % IV SOLN
2.0000 g | Freq: Every day | INTRAVENOUS | Status: DC
  Administered 2016-09-16 – 2016-09-17 (×3): 2 g via INTRAVENOUS
  Filled 2016-09-16 (×3): qty 2

## 2016-09-16 MED ORDER — SODIUM CHLORIDE 0.9 % IV SOLN
Freq: Once | INTRAVENOUS | Status: AC
  Administered 2016-09-16: 10:00:00 via INTRAVENOUS

## 2016-09-16 MED ORDER — SODIUM CHLORIDE 0.9 % IV SOLN: Freq: Once | INTRAVENOUS | Status: DC

## 2016-09-16 MED ORDER — POTASSIUM CHLORIDE 10 MEQ/100ML IV SOLN
10.0000 meq | INTRAVENOUS | Status: AC
  Administered 2016-09-16 (×3): 10 meq via INTRAVENOUS
  Filled 2016-09-16 (×3): qty 100

## 2016-09-16 MED ORDER — SODIUM CHLORIDE 0.9% FLUSH
3.0000 mL | Freq: Two times a day (BID) | INTRAVENOUS | Status: DC
  Administered 2016-09-16 – 2016-09-18 (×6): 3 mL via INTRAVENOUS

## 2016-09-16 MED ORDER — ENOXAPARIN SODIUM 40 MG/0.4ML ~~LOC~~ SOLN
40.0000 mg | SUBCUTANEOUS | Status: DC
  Filled 2016-09-16 (×2): qty 0.4

## 2016-09-16 MED ORDER — ACETAMINOPHEN 500 MG PO TABS
500.0000 mg | ORAL_TABLET | Freq: Once | ORAL | Status: AC
  Administered 2016-09-16: 500 mg via ORAL
  Filled 2016-09-16: qty 1

## 2016-09-16 MED ORDER — ACETAMINOPHEN 650 MG RE SUPP: 650.0000 mg | Freq: Four times a day (QID) | RECTAL | Status: DC | PRN

## 2016-09-16 MED ORDER — SODIUM CHLORIDE 0.9 % IV BOLUS (SEPSIS): 1000.0000 mL | Freq: Once | INTRAVENOUS | Status: DC

## 2016-09-16 MED ORDER — ACETAMINOPHEN 325 MG PO TABS
650.0000 mg | ORAL_TABLET | Freq: Four times a day (QID) | ORAL | Status: DC | PRN
  Administered 2016-09-16 – 2016-09-17 (×6): 650 mg via ORAL
  Filled 2016-09-16 (×7): qty 2

## 2016-09-16 MED ORDER — SODIUM CHLORIDE 0.9 % IV SOLN
INTRAVENOUS | Status: DC
  Administered 2016-09-16 – 2016-09-17 (×2): via INTRAVENOUS

## 2016-09-16 MED ORDER — OXYCODONE HCL 5 MG PO TABS
5.0000 mg | ORAL_TABLET | ORAL | Status: DC | PRN
  Administered 2016-09-16 – 2016-09-18 (×9): 5 mg via ORAL
  Filled 2016-09-16 (×10): qty 1

## 2016-09-16 MED ORDER — POTASSIUM CHLORIDE CRYS ER 20 MEQ PO TBCR
40.0000 meq | EXTENDED_RELEASE_TABLET | Freq: Two times a day (BID) | ORAL | Status: AC
  Administered 2016-09-16 (×3): 40 meq via ORAL
  Filled 2016-09-16 (×3): qty 2

## 2016-09-16 MED ORDER — PREMIER PROTEIN SHAKE
11.0000 [oz_av] | ORAL | Status: DC
Start: 2016-09-16 — End: 2016-09-18
  Filled 2016-09-16 (×2): qty 325.31

## 2016-09-16 NOTE — ED Notes (Signed)
Hospitalist at bedside 

## 2016-09-16 NOTE — Progress Notes (Signed)
At change of shift patient noted to be tachycardic to the 110s, febrile to 102.8 after tylenol given. Tylene Fantasia notified. Additional 500mg  Tylenol ordered and given to patient.

## 2016-09-16 NOTE — Progress Notes (Signed)
Initial Nutrition Assessment  DOCUMENTATION CODES:   Obesity unspecified  INTERVENTION:   Provide trial of Premier Protein supplements, each provides 160 kcal and 30g protein Encourage PO intake RD to continue to monitor for needs  NUTRITION DIAGNOSIS:   Inadequate oral intake related to nausea, vomiting as evidenced by per patient/family report.  GOAL:   Patient will meet greater than or equal to 90% of their needs  MONITOR:   PO intake, Supplement acceptance, Labs, Weight trends, I & O's, Skin  REASON FOR ASSESSMENT:   Low [place]    ASSESSMENT:   36 y.o. female with a past medical history significant for quadruplegia who presents with 3 days vomiting.  Patient in room with visitor at bedside asleep. Pt laying flat in bed and seems uncomfortable. Denies any nausea at the moment but states she was experiencing some this morning. Pt states she has not received any nausea medication. Pt reports vomiting for 3 days PTA and was unable to keep anything down during that time. Prior to these symptoms, she normally eats 1-2 meals a day and does not consume protein supplements. When offered supplements she was not interested in anything like Ensure supplements. Pt was hesitantly willing to try Premier Protein. Will order.   Per chart review, pt with weight gain. Nutrition focused physical exam shows no sign of depletion of muscle mass or body fat.  Medications: KCl infusion, K-DUR tablet BID Labs reviewed: Low K, Mg  Diet Order:  Diet regular Room service appropriate? Yes; Fluid consistency: Thin  Skin:  Wound (see comment) (Stage II abdominal pressure injury)  Last BM:  PTA  Height:   Ht Readings from Last 1 Encounters:  09/16/16 5\' 6"  (1.676 m)    Weight:   Wt Readings from Last 1 Encounters:  09/16/16 225 lb 1.4 oz (102.1 kg)    Ideal Body Weight:  53.1 kg  BMI:  Body mass index is 36.33 kg/m.  Estimated Nutritional Needs:   Kcal:  1650-1850  Protein:   70-80g  Fluid:  1.6-1.8L/day  EDUCATION NEEDS:   No education needs identified at this time  Clayton Bibles, MS, RD, LDN Pager: [phone] After Hours Pager: [phone]

## 2016-09-16 NOTE — Progress Notes (Signed)
PHARMACY NOTE -  ANTIBIOTIC RENAL DOSE ADJUSTMENT   Request received for Pharmacy to assist with antibiotic renal dose adjustment.  Patient has been initiated on Ceftriaxone 2gm iv q24hr  for UTI. SCr 1.26, estimated CrCl ~75 ml/min Current dosage is appropriate and need for further dosage adjustment appears unlikely at present. Will sign off at this time.  Please reconsult if a change in clinical status warrants re-evaluation of dosage.

## 2016-09-16 NOTE — Progress Notes (Signed)
Patient was admitted early this AM after midnight and H and P has been reviewed and I am in current agreement with the Assessment and Plan done by Dr. Loleta Books. Patient is a 36 year-old quadriplegic female who is wheelchair bound at baseline who presented to [facility] with a cc of vomiting, malaise, and diffuse abdominal pain/discomfort. She has had several episodes of loose stools as well but recently developed the nausea/vomiting and abdominal pain in the last 2-3 days. She was admitted and found to be Septic from Urinary Tract Infection.  Current Assessment and Plan:  1. Sepsis from UTI/Pyelonephritis:  -Suspected source urine. Organism unknown.  -Patient meets criteria given tachycardia, tachypnea, leukocytosis, and evidence of organ dysfunction.  Lactate normal.  Antibiotics delivered in the ED.   -Sepsis bundle utilized:             -Blood and urine cultures drawn             -30 ml/kg bolus given in ED             -Antibiotics: ceftriaxone IV             -Repeat renal function and complete blood count in AM             -Code SEPSIS called to E-link -C/w IVF rehydration at 100 mL/hr -Follow Cx'sand CBC's; WBC up to 28.8  2. Microcytic anemia:  -Hb/Hct Dropped to 6.3/20.3 this AM -Long-standing.  Not on iron at home.  Denies history of gross bleeding. -Check ferritin, iron studies -Transfused one unit this AM; Will order and transfuse an additional unit -Post transfusion H/H  3. Hyponatremia:  -In setting of sepsis. -Check urine sodium, osmolalities -Improved with IVF Rehydration  4. Hypokalemia:  -Potassium dropped to 2.8 -Replete with IV and po KCl -Mag Level was also low -Repeat CMP in AM  5. Hypomagnesemia -Patient's Mag Level was 1.4 -Replete with IV Mag Sulfate -Repeat Mag Level in AM  6. Acute kidney injury:  -Improving -Suspect this is from UTI, possibly pre-renal azotemia. -Check FeNA -C/w IV Fluids at 100 mL/hr and trend Cr  7. Prolonged QT interval:   -Replete K and Mag -Repeat ECG tomorrow -Monitor on telemetry -Avoid QT prolonging agents  8. Thrombocytosis -Likely in the setting of Infection -Repeat CBC in AM  Will continue to follow patient closely as she remains hypotensive. May need additional fluid boluses. Will follow clinical response to Abx and follow cultures and repeat blood work in AM.

## 2016-09-16 NOTE — Care Management Note (Signed)
Case Management Note  Patient Details  Name: BRIN RUGGERIO MRN: [id number] Date of Birth: 1981-01-04  Subjective/Objective:     Hypokalemia,hypotensive,anemia requiring bld product transfusion.               Action/Plan:Date:  September 16, 2016 Chart reviewed for concurrent status and case management needs. Will continue to follow patient progress. Discharge Planning: following for needs Expected discharge date: [id number] Velva Harman, BSN, [place], [place]   Expected Discharge Date:   (unknown)               Expected Discharge Plan:  Home/Self Care  In-House Referral:     Discharge planning Services     Post Acute Care Choice:    Choice offered to:     DME Arranged:    DME Agency:     HH Arranged:    [place] Agency:     Status of Service:  In process, will continue to follow  If discussed at Long Length of Stay Meetings, dates discussed:    Additional Comments:  Leeroy Cha, RN 09/16/2016, 9:49 AM
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
## 2016-09-17 DIAGNOSIS — B9620 Unspecified Escherichia coli [E. coli] as the cause of diseases classified elsewhere: Secondary | ICD-10-CM

## 2016-09-17 DIAGNOSIS — N390 Urinary tract infection, site not specified: Secondary | ICD-10-CM

## 2016-09-17 DIAGNOSIS — A4151 Sepsis due to Escherichia coli [E. coli]: Secondary | ICD-10-CM

## 2016-09-17 LAB — COMPREHENSIVE METABOLIC PANEL
ALT: 28 U/L (ref 14–54)
ANION GAP: 9 (ref 5–15)
AST: 35 U/L (ref 15–41)
Albumin: 2.6 g/dL — ABNORMAL LOW (ref 3.5–5.0)
Alkaline Phosphatase: 65 U/L (ref 38–126)
BUN: 6 mg/dL (ref 6–20)
CHLORIDE: 106 mmol/L (ref 101–111)
CO2: 21 mmol/L — AB (ref 22–32)
Calcium: 7.9 mg/dL — ABNORMAL LOW (ref 8.9–10.3)
Creatinine, Ser: 0.54 mg/dL (ref 0.44–1.00)
GFR calc non Af Amer: >60 mL/min (ref >60)
Glucose, Bld: 101 mg/dL — ABNORMAL HIGH (ref 65–99)
Potassium: 4.6 mmol/L (ref 3.5–5.1)
SODIUM: 136 mmol/L (ref 135–145)
Total Bilirubin: 0.5 mg/dL (ref 0.3–1.2)
Total Protein: 6.3 g/dL — ABNORMAL LOW (ref 6.5–8.1)

## 2016-09-17 LAB — TYPE AND SCREEN
ABO/RH(D): O POS
Antibody Screen: NEGATIVE
UNIT DIVISION: 0
Unit division: 0

## 2016-09-17 LAB — MAGNESIUM: MAGNESIUM: 1.7 mg/dL (ref 1.7–2.4)

## 2016-09-17 LAB — CBC WITH DIFFERENTIAL/PLATELET
BASOS ABS: 0 10*3/uL (ref 0.0–0.1)
Basophils Relative: 0 %
Eosinophils Absolute: 0 10*3/uL (ref 0.0–0.7)
Eosinophils Relative: 0 %
HEMATOCRIT: 24.6 % — AB (ref 36.0–46.0)
HEMOGLOBIN: 7.9 g/dL — AB (ref 12.0–15.0)
LYMPHS PCT: 18 %
Lymphs Abs: 2.5 10*3/uL (ref 0.7–4.0)
MCH: 21.9 pg — AB (ref 26.0–34.0)
MCHC: 32.1 g/dL (ref 30.0–36.0)
MCV: 68.1 fL — AB (ref 78.0–100.0)
MONOS PCT: 9 %
Monocytes Absolute: 1.2 10*3/uL — ABNORMAL HIGH (ref 0.1–1.0)
NEUTROS ABS: 10.1 10*3/uL — AB (ref 1.7–7.7)
Neutrophils Relative %: 73 %
PLATELETS: 580 10*3/uL — AB (ref 150–400)
RBC: 3.61 MIL/uL — AB (ref 3.87–5.11)
RDW: 21.5 % — AB (ref 11.5–15.5)
WBC: 13.8 10*3/uL — ABNORMAL HIGH (ref 4.0–10.5)

## 2016-09-17 LAB — INFLUENZA PANEL BY PCR (TYPE A & B)
INFLAPCR: NEGATIVE
Influenza B By PCR: NEGATIVE

## 2016-09-17 LAB — BPAM RBC
BLOOD PRODUCT EXPIRATION DATE: 201804192359
Blood Product Expiration Date: 201804182359
ISSUE DATE / TIME: 201803280510
ISSUE DATE / TIME: 201803280925
UNIT TYPE AND RH: 5100
Unit Type and Rh: 5100

## 2016-09-17 LAB — PHOSPHORUS: PHOSPHORUS: 1.3 mg/dL — AB (ref 2.5–4.6)

## 2016-09-17 MED ORDER — DEXTROSE 5 % IV SOLN
30.0000 mmol | Freq: Once | INTRAVENOUS | Status: AC
  Administered 2016-09-17: 30 mmol via INTRAVENOUS
  Filled 2016-09-17: qty 10

## 2016-09-17 NOTE — Progress Notes (Signed)
Phosphorus 1.3 called to Dr Massie Kluver.`

## 2016-09-17 NOTE — Progress Notes (Signed)
Patient refuses lovenox. States it hurts when given.
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
## 2016-09-18 LAB — CBC WITH DIFFERENTIAL/PLATELET
BASOS ABS: 0 10*3/uL (ref 0.0–0.1)
Basophils Relative: 0 %
Eosinophils Absolute: 0.2 10*3/uL (ref 0.0–0.7)
Eosinophils Relative: 2 %
HEMATOCRIT: 22.9 % — AB (ref 36.0–46.0)
Hemoglobin: 7.3 g/dL — ABNORMAL LOW (ref 12.0–15.0)
LYMPHS ABS: 2.2 10*3/uL (ref 0.7–4.0)
Lymphocytes Relative: 28 %
MCH: 22 pg — ABNORMAL LOW (ref 26.0–34.0)
MCHC: 31.9 g/dL (ref 30.0–36.0)
MCV: 69 fL — ABNORMAL LOW (ref 78.0–100.0)
MONOS PCT: 9 %
Monocytes Absolute: 0.7 10*3/uL (ref 0.1–1.0)
NEUTROS ABS: 4.9 10*3/uL (ref 1.7–7.7)
Neutrophils Relative %: 61 %
Platelets: 532 10*3/uL — ABNORMAL HIGH (ref 150–400)
RBC: 3.32 MIL/uL — ABNORMAL LOW (ref 3.87–5.11)
RDW: 21.9 % — ABNORMAL HIGH (ref 11.5–15.5)
WBC: 8 10*3/uL (ref 4.0–10.5)

## 2016-09-18 LAB — COMPREHENSIVE METABOLIC PANEL
ALBUMIN: 2.6 g/dL — AB (ref 3.5–5.0)
ALT: 25 U/L (ref 14–54)
ANION GAP: 8 (ref 5–15)
AST: 28 U/L (ref 15–41)
Alkaline Phosphatase: 64 U/L (ref 38–126)
BUN: <5 mg/dL — ABNORMAL LOW (ref 6–20)
CO2: 24 mmol/L (ref 22–32)
Calcium: 7.9 mg/dL — ABNORMAL LOW (ref 8.9–10.3)
Chloride: 104 mmol/L (ref 101–111)
Creatinine, Ser: 0.4 mg/dL — ABNORMAL LOW (ref 0.44–1.00)
Glucose, Bld: 103 mg/dL — ABNORMAL HIGH (ref 65–99)
POTASSIUM: 3.6 mmol/L (ref 3.5–5.1)
Sodium: 136 mmol/L (ref 135–145)
TOTAL PROTEIN: 6.3 g/dL — AB (ref 6.5–8.1)
Total Bilirubin: 0.4 mg/dL (ref 0.3–1.2)

## 2016-09-18 LAB — PHOSPHORUS: PHOSPHORUS: 3.3 mg/dL (ref 2.5–4.6)

## 2016-09-18 LAB — MAGNESIUM: MAGNESIUM: 1.4 mg/dL — AB (ref 1.7–2.4)
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
## 2016-09-18 NOTE — Progress Notes (Signed)
Patient asked to speak with physician (Dr. Alfredia Ferguson) because she wants to leave and go home. Dr. Alfredia Ferguson paged and spoke with Unit Director, Antoine Poche about situation. Physician feels like patient is in sound mind and capable to making her own decisions. Patient informed that she is leaving against medical advice and if she needs any further care she can return to emergency department at anytime. Telemetry monitor and IV removed. Patient verbally requested that her AMA form be signed by her significant other Ray Church), this was witnessed by Cecile Sheerer, RN & Antoine Poche, RN. Patient transported out of room in her own personal wheelchair with all of her personal belongings by significant other. Dr. Alfredia Ferguson informed that patient left AMA.

## 2016-09-19 LAB — URINE CULTURE: Culture: >=100000 — AB

## 2016-09-21 ENCOUNTER — Telehealth: Payer: Self-pay | Admitting: Family Medicine

## 2016-09-21 LAB — CULTURE, BLOOD (ROUTINE X 2)
Culture: NO GROWTH
Culture: NO GROWTH

## 2016-09-21 NOTE — Telephone Encounter (Signed)
Andria Rhein with [facility] informed that forms were faxed to [facility] at [phone].  Original copied placed up front for pick up.  Derl Barrow, RN

## 2016-09-21 NOTE — Telephone Encounter (Signed)
Called both home and mobile numbers and was not able to get into contact with patient. Left message for patient on home number asking her to make a hospital follow up appointment given she left AMA from [facility] and was inadequately treated for sepsis from E coli UTI.

## 2016-09-22 NOTE — Telephone Encounter (Signed)
Jackelyn Poling has a question about the form and the date of order on it.  I am unable to locate the copy at this time, will check with Tamika when she returns.  Debbie informed and agreeable. Fleeger, Salome Spotted, CMA

## 2016-09-23 NOTE — Telephone Encounter (Signed)
Will forward to Dr. Nori Riis.  Copy placed in April 2nd fax forms.  Derl Barrow, RN

## 2016-10-12 ENCOUNTER — Ambulatory Visit (INDEPENDENT_AMBULATORY_CARE_PROVIDER_SITE_OTHER): Payer: Medicare Other | Admitting: *Deleted

## 2016-10-12 DIAGNOSIS — T148XXA Other injury of unspecified body region, initial encounter: Secondary | ICD-10-CM

## 2016-10-12 NOTE — Patient Instructions (Signed)
Come back tomorrow at 3pm for your appointment to have your hand re-evaluated and also your urine checked. Aminat Shelburne,CMA

## 2016-10-12 NOTE — Progress Notes (Signed)
Patient walked in today for a blister on her right hand that came up this morning.  Patient stated that due to her paralysis she is unable to really determine the pain level and has none currently.  When patient first arrived she was offered to wait in the clinic until someone could squeeze her in or go to the urgent care and patient opted to wait.  Due to a packed schedule patient was unable to be seen for a full office visit today but was precepted by Dr. Nori Riis who advised patient to do a clean dressing for tonight and come back tomorrow for evaluation and treatment.  Patient informed Dr. Nori Riis that when she gets UTI or bladder infections these blisters, seem to be a first sign.  Patient was sent home with a right hand wrapped with non stick tefla and kerlix gauze.  Also a catheterization kit to do at home before her appointment tomorrow so we can check her urine for abnormalities.  Patient voiced understanding.  Also checked her medication list for most recent cream for eczema and it was kenalog.  Patient was not fully sure the last thing she tried but knows there have been a few things.  Saliou Barnier,CMA

## 2016-10-13 ENCOUNTER — Encounter: Payer: Self-pay | Admitting: Family Medicine

## 2016-10-13 ENCOUNTER — Ambulatory Visit (INDEPENDENT_AMBULATORY_CARE_PROVIDER_SITE_OTHER): Payer: Medicare Other | Admitting: Family Medicine

## 2016-10-13 VITALS — BP 70/50 | HR 101 | Temp 98.5°F

## 2016-10-13 DIAGNOSIS — N3000 Acute cystitis without hematuria: Secondary | ICD-10-CM | POA: Diagnosis not present

## 2016-10-13 DIAGNOSIS — L139 Bullous disorder, unspecified: Secondary | ICD-10-CM | POA: Insufficient documentation

## 2016-10-13 LAB — POCT URINALYSIS DIP (MANUAL ENTRY)
BILIRUBIN UA: NEGATIVE
BILIRUBIN UA: NEGATIVE mg/dL
Glucose, UA: NEGATIVE mg/dL
Nitrite, UA: POSITIVE — AB
SPEC GRAV UA: 1.025 (ref 1.010–1.025)
Urobilinogen, UA: 0.2 E.U./dL
pH, UA: 6.5 (ref 5.0–8.0)

## 2016-10-13 LAB — POCT UA - MICROSCOPIC ONLY: WBC, Ur, HPF, POC: >20

## 2016-10-13 MED ORDER — CEPHALEXIN 500 MG PO CAPS: 500.0000 mg | ORAL_CAPSULE | Freq: Four times a day (QID) | ORAL | 0 refills | Status: AC

## 2016-10-13 MED ORDER — CLOBETASOL PROPIONATE 0.05 % EX OINT: 1.0000 "application " | TOPICAL_OINTMENT | Freq: Two times a day (BID) | CUTANEOUS | 0 refills | Status: DC

## 2016-10-13 NOTE — Patient Instructions (Signed)

## 2016-10-13 NOTE — Assessment & Plan Note (Signed)
Per patient, these lesions have been biopsied in the past and she has had success treating them with high-dose topical steroids No evidence of superimposed infection We will treat with clobetasol ointment twice daily Wrapped with nonstick pad and Kerlix before leaving clinic Return precautions discussed

## 2016-10-13 NOTE — Progress Notes (Signed)
Subjective:   Tracey Hall is a 36 y.o. female with a history of quadriplegia, neurogenic bladder, recurrent UTIs here for same day appt for  Chief Complaint  Patient presents with  . Urinary Tract Infection    Patient noticed blisters on her right hand starting yesterday. She has noticed in the past to these recur when she has some sort of bacterial infection such as a UTI. She denies any fever, dysuria, hematuria, urinary frequency or urgency she is noted. With UTIs in the past however due to her quadriplegia, she often does not have dysuria or urgency. She was seen by a nurse in clinic yesterday and advised to bring in a catheterized urine sample today.  She reports that the blisters on her hand have been recurring intermittently for many years. Initially, they were thought to be pressure wounds. They were biopsied about 4 years ago by a dermatologist and she was told that she has a rare form of eczema and was given a cream to treat it. She thinks she is tried Kenalog in the past and did not help and that she was given a higher strength topical steroid by the dermatologist which did help. She hasn't noticed any redness or swelling around the area or honey crusting or purulent drainage.  Review of Systems:  Per HPI.   Social History: never smoker  Objective:  BP (!) 70/50   Pulse (!) 101   Temp 98.5 F (36.9 C) (Oral)   SpO2 97%   Gen:  36 y.o. female in NAD, sitting in wheelchair HEENT: NCAT, MMM, anicteric sclerae CV: RRR, no MRG Resp: Non-labored, CTAB, no wheezes noted Abd: Soft, diffuse mild tenderness worse suprapubically, ND, BS present, no guarding or organomegaly, no CVA tenderness Ext: WWP, no edema Skin: several bullae on dorsum of R hand at base of thumb.  No surrounding erythema Neuro: Alert and oriented, speech normal     Urinalysis    Component Value Date/Time   COLORURINE YELLOW 09/16/2016 0006   APPEARANCEUR TURBID (A) 09/16/2016 0006   LABSPEC 1.016  09/16/2016 0006   PHURINE 5.0 09/16/2016 0006   GLUCOSEU NEGATIVE 09/16/2016 0006   HGBUR NEGATIVE 09/16/2016 0006   HGBUR moderate 01/10/2010 1430   BILIRUBINUR negative 10/13/2016   BILIRUBINUR NEG 02/19/2012 1440   KETONESUR negative 10/13/2016   KETONESUR NEGATIVE 09/16/2016 0006   PROTEINUR =30 (A) 10/13/2016   PROTEINUR 30 (A) 09/16/2016 0006   UROBILINOGEN 0.2 10/13/2016   UROBILINOGEN 1.0 04/01/2014 0918   NITRITE Positive (A) 10/13/2016   NITRITE NEGATIVE 09/16/2016 0006   LEUKOCYTESUR Large (3+) (A) 10/13/2016    Assessment & Plan:     Tracey Hall is a 36 y.o. female here for   Acute cystitis without hematuria Urinalysis consistent with UTI with leukocytes and nitrite positive No signs of systemic infection as patient is afebrile with stable vital signs without CVA tenderness Last urine culture had pansensitive Escherichia coli We will send a repeat urine culture today We will treat with five-day course of Keflex Return precautions discussed Advised patient to see her PCP as she may need to see urology for prophylaxis for recurrent UTIs  Bullous dermatitis Per patient, these lesions have been biopsied in the past and she has had success treating them with high-dose topical steroids No evidence of superimposed infection We will treat with clobetasol ointment twice daily Wrapped with nonstick pad and Kerlix before leaving clinic Return precautions discussed   Virginia Crews, MD MPH PGY-3,  [place] 10/13/2016  4:09 PM

## 2016-10-13 NOTE — Assessment & Plan Note (Signed)
Urinalysis consistent with UTI with leukocytes and nitrite positive No signs of systemic infection as patient is afebrile with stable vital signs without CVA tenderness Last urine culture had pansensitive Escherichia coli We will send a repeat urine culture today We will treat with five-day course of Keflex Return precautions discussed Advised patient to see her PCP as she may need to see urology for prophylaxis for recurrent UTIs

## 2016-10-14 NOTE — Progress Notes (Signed)
I briefly saw patient and with that the blisters on her right hand and wrist. They do not appear to be infected. She reports some type of dermatological diagnosis given to her a few years ago which she cannot remember the name of the diagnosis. They gave her some "high-dose steroid cream". I will refill clobetasol. We'll have her come back tomorrow to be seen. She's also having some urinary frequency. She uses and out cath at home so we gave her urine Chipper Herb will have her do and out cath immediately before coming to clinic tomorrow so we can check a urine.  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 It's fairly isolated however. There is only 2 blisters on her right wrist. She has a healing area on the left wrist where she said a different blister was a couple of weeks ago and there some hypopigmentation there. Unclear what the dermatological diagnosis was. If this continues to be an issue, we might have to dig into her old paper chart to see if there is some record is LC thing in the EMR.

## 2016-10-16 LAB — URINE CULTURE

## 2017-07-23 DIAGNOSIS — K269 Duodenal ulcer, unspecified as acute or chronic, without hemorrhage or perforation: Secondary | ICD-10-CM

## 2017-07-23 DIAGNOSIS — A419 Sepsis, unspecified organism: Secondary | ICD-10-CM

## 2017-07-23 DIAGNOSIS — D62 Acute posthemorrhagic anemia: Secondary | ICD-10-CM

## 2017-07-23 DIAGNOSIS — K259 Gastric ulcer, unspecified as acute or chronic, without hemorrhage or perforation: Secondary | ICD-10-CM

## 2017-07-23 HISTORY — DX: Acute posthemorrhagic anemia: D62

## 2017-07-23 HISTORY — DX: Gastric ulcer, unspecified as acute or chronic, without hemorrhage or perforation: K25.9

## 2017-07-23 HISTORY — DX: Duodenal ulcer, unspecified as acute or chronic, without hemorrhage or perforation: K26.9

## 2017-07-23 HISTORY — DX: Sepsis, unspecified organism: A41.9

## 2017-07-30 ENCOUNTER — Other Ambulatory Visit: Payer: Self-pay

## 2017-07-30 ENCOUNTER — Encounter (HOSPITAL_COMMUNITY): Payer: Self-pay

## 2017-07-30 DIAGNOSIS — K264 Chronic or unspecified duodenal ulcer with hemorrhage: Secondary | ICD-10-CM | POA: Diagnosis present

## 2017-07-30 DIAGNOSIS — L139 Bullous disorder, unspecified: Secondary | ICD-10-CM | POA: Diagnosis present

## 2017-07-30 DIAGNOSIS — Z6838 Body mass index (BMI) 38.0-38.9, adult: Secondary | ICD-10-CM

## 2017-07-30 DIAGNOSIS — R0603 Acute respiratory distress: Secondary | ICD-10-CM | POA: Diagnosis not present

## 2017-07-30 DIAGNOSIS — I959 Hypotension, unspecified: Secondary | ICD-10-CM | POA: Diagnosis present

## 2017-07-30 DIAGNOSIS — N179 Acute kidney failure, unspecified: Secondary | ICD-10-CM | POA: Diagnosis not present

## 2017-07-30 DIAGNOSIS — F411 Generalized anxiety disorder: Secondary | ICD-10-CM | POA: Diagnosis present

## 2017-07-30 DIAGNOSIS — K269 Duodenal ulcer, unspecified as acute or chronic, without hemorrhage or perforation: Secondary | ICD-10-CM | POA: Diagnosis present

## 2017-07-30 DIAGNOSIS — G8254 Quadriplegia, C5-C7 incomplete: Secondary | ICD-10-CM | POA: Diagnosis present

## 2017-07-30 DIAGNOSIS — A4159 Other Gram-negative sepsis: Secondary | ICD-10-CM | POA: Diagnosis present

## 2017-07-30 DIAGNOSIS — R6520 Severe sepsis without septic shock: Secondary | ICD-10-CM | POA: Diagnosis present

## 2017-07-30 DIAGNOSIS — R410 Disorientation, unspecified: Secondary | ICD-10-CM | POA: Diagnosis not present

## 2017-07-30 DIAGNOSIS — N12 Tubulo-interstitial nephritis, not specified as acute or chronic: Secondary | ICD-10-CM | POA: Diagnosis present

## 2017-07-30 DIAGNOSIS — E876 Hypokalemia: Secondary | ICD-10-CM | POA: Diagnosis present

## 2017-07-30 DIAGNOSIS — Z87440 Personal history of urinary (tract) infections: Secondary | ICD-10-CM

## 2017-07-30 DIAGNOSIS — S14155S Other incomplete lesion at C5 level of cervical spinal cord, sequela: Secondary | ICD-10-CM

## 2017-07-30 DIAGNOSIS — E669 Obesity, unspecified: Secondary | ICD-10-CM | POA: Diagnosis present

## 2017-07-30 DIAGNOSIS — R1110 Vomiting, unspecified: Secondary | ICD-10-CM | POA: Diagnosis not present

## 2017-07-30 DIAGNOSIS — R0602 Shortness of breath: Secondary | ICD-10-CM | POA: Diagnosis not present

## 2017-07-30 DIAGNOSIS — R000 Tachycardia, unspecified: Secondary | ICD-10-CM | POA: Diagnosis not present

## 2017-07-30 DIAGNOSIS — A4151 Sepsis due to Escherichia coli [E. coli]: Principal | ICD-10-CM | POA: Diagnosis present

## 2017-07-30 DIAGNOSIS — Z823 Family history of stroke: Secondary | ICD-10-CM

## 2017-07-30 DIAGNOSIS — R571 Hypovolemic shock: Secondary | ICD-10-CM | POA: Diagnosis not present

## 2017-07-30 DIAGNOSIS — R509 Fever, unspecified: Secondary | ICD-10-CM | POA: Diagnosis not present

## 2017-07-30 DIAGNOSIS — R748 Abnormal levels of other serum enzymes: Secondary | ICD-10-CM | POA: Diagnosis present

## 2017-07-30 DIAGNOSIS — N3000 Acute cystitis without hematuria: Secondary | ICD-10-CM | POA: Diagnosis not present

## 2017-07-30 DIAGNOSIS — K2210 Ulcer of esophagus without bleeding: Secondary | ICD-10-CM | POA: Diagnosis present

## 2017-07-30 DIAGNOSIS — J90 Pleural effusion, not elsewhere classified: Secondary | ICD-10-CM | POA: Diagnosis not present

## 2017-07-30 DIAGNOSIS — E860 Dehydration: Secondary | ICD-10-CM | POA: Diagnosis present

## 2017-07-30 DIAGNOSIS — D500 Iron deficiency anemia secondary to blood loss (chronic): Secondary | ICD-10-CM | POA: Diagnosis present

## 2017-07-30 DIAGNOSIS — Z781 Physical restraint status: Secondary | ICD-10-CM

## 2017-07-30 DIAGNOSIS — A419 Sepsis, unspecified organism: Secondary | ICD-10-CM | POA: Diagnosis not present

## 2017-07-30 DIAGNOSIS — J189 Pneumonia, unspecified organism: Secondary | ICD-10-CM | POA: Diagnosis not present

## 2017-07-30 DIAGNOSIS — E872 Acidosis: Secondary | ICD-10-CM | POA: Diagnosis not present

## 2017-07-30 DIAGNOSIS — W3400XS Accidental discharge from unspecified firearms or gun, sequela: Secondary | ICD-10-CM

## 2017-07-30 DIAGNOSIS — N319 Neuromuscular dysfunction of bladder, unspecified: Secondary | ICD-10-CM | POA: Diagnosis present

## 2017-07-30 NOTE — ED Triage Notes (Signed)
Pt states that she is in severe pain. She is unable to state where she is hurting, she just continually states that "it hurts all over, I cant take this." She also has a blister on her R pinky. She is in a motorized wheelchair. She will not stay still for BP check. A&Ox4.

## 2017-07-31 ENCOUNTER — Inpatient Hospital Stay (HOSPITAL_COMMUNITY)
Admission: EM | Admit: 2017-07-31 | Discharge: 2017-08-11 | DRG: 871 | Disposition: A | Discharge status: Home Health | Payer: Medicare Other | Attending: Internal Medicine | Admitting: Internal Medicine

## 2017-07-31 ENCOUNTER — Encounter (HOSPITAL_COMMUNITY): Payer: Self-pay | Admitting: Family Medicine

## 2017-07-31 ENCOUNTER — Inpatient Hospital Stay (HOSPITAL_COMMUNITY): Payer: Medicare Other

## 2017-07-31 ENCOUNTER — Emergency Department (HOSPITAL_COMMUNITY): Payer: Medicare Other

## 2017-07-31 DIAGNOSIS — R571 Hypovolemic shock: Secondary | ICD-10-CM | POA: Diagnosis not present

## 2017-07-31 DIAGNOSIS — Z789 Other specified health status: Secondary | ICD-10-CM

## 2017-07-31 DIAGNOSIS — R0600 Dyspnea, unspecified: Secondary | ICD-10-CM

## 2017-07-31 DIAGNOSIS — K922 Gastrointestinal hemorrhage, unspecified: Secondary | ICD-10-CM | POA: Diagnosis not present

## 2017-07-31 DIAGNOSIS — Z4682 Encounter for fitting and adjustment of non-vascular catheter: Secondary | ICD-10-CM | POA: Diagnosis not present

## 2017-07-31 DIAGNOSIS — D509 Iron deficiency anemia, unspecified: Secondary | ICD-10-CM

## 2017-07-31 DIAGNOSIS — Z452 Encounter for adjustment and management of vascular access device: Secondary | ICD-10-CM | POA: Diagnosis not present

## 2017-07-31 DIAGNOSIS — R0682 Tachypnea, not elsewhere classified: Secondary | ICD-10-CM

## 2017-07-31 DIAGNOSIS — K9423 Gastrostomy malfunction: Secondary | ICD-10-CM | POA: Diagnosis not present

## 2017-07-31 DIAGNOSIS — N3000 Acute cystitis without hematuria: Secondary | ICD-10-CM

## 2017-07-31 DIAGNOSIS — D649 Anemia, unspecified: Secondary | ICD-10-CM | POA: Diagnosis present

## 2017-07-31 DIAGNOSIS — J189 Pneumonia, unspecified organism: Secondary | ICD-10-CM | POA: Diagnosis not present

## 2017-07-31 DIAGNOSIS — K209 Esophagitis, unspecified: Secondary | ICD-10-CM | POA: Diagnosis not present

## 2017-07-31 DIAGNOSIS — J9690 Respiratory failure, unspecified, unspecified whether with hypoxia or hypercapnia: Secondary | ICD-10-CM

## 2017-07-31 DIAGNOSIS — R0603 Acute respiratory distress: Secondary | ICD-10-CM | POA: Diagnosis not present

## 2017-07-31 DIAGNOSIS — D62 Acute posthemorrhagic anemia: Secondary | ICD-10-CM | POA: Diagnosis not present

## 2017-07-31 DIAGNOSIS — L139 Bullous disorder, unspecified: Secondary | ICD-10-CM | POA: Diagnosis present

## 2017-07-31 DIAGNOSIS — K9420 Gastrostomy complication, unspecified: Secondary | ICD-10-CM

## 2017-07-31 DIAGNOSIS — B9620 Unspecified Escherichia coli [E. coli] as the cause of diseases classified elsewhere: Secondary | ICD-10-CM | POA: Diagnosis not present

## 2017-07-31 DIAGNOSIS — Z9911 Dependence on respirator [ventilator] status: Secondary | ICD-10-CM | POA: Diagnosis not present

## 2017-07-31 DIAGNOSIS — E669 Obesity, unspecified: Secondary | ICD-10-CM | POA: Diagnosis present

## 2017-07-31 DIAGNOSIS — R109 Unspecified abdominal pain: Secondary | ICD-10-CM | POA: Diagnosis not present

## 2017-07-31 DIAGNOSIS — R0602 Shortness of breath: Secondary | ICD-10-CM | POA: Diagnosis not present

## 2017-07-31 DIAGNOSIS — R1110 Vomiting, unspecified: Secondary | ICD-10-CM | POA: Diagnosis not present

## 2017-07-31 DIAGNOSIS — E876 Hypokalemia: Secondary | ICD-10-CM | POA: Diagnosis present

## 2017-07-31 DIAGNOSIS — W3400XS Accidental discharge from unspecified firearms or gun, sequela: Secondary | ICD-10-CM | POA: Diagnosis not present

## 2017-07-31 DIAGNOSIS — R509 Fever, unspecified: Secondary | ICD-10-CM | POA: Diagnosis present

## 2017-07-31 DIAGNOSIS — N10 Acute tubulo-interstitial nephritis: Secondary | ICD-10-CM | POA: Diagnosis not present

## 2017-07-31 DIAGNOSIS — N319 Neuromuscular dysfunction of bladder, unspecified: Secondary | ICD-10-CM | POA: Diagnosis present

## 2017-07-31 DIAGNOSIS — I959 Hypotension, unspecified: Secondary | ICD-10-CM | POA: Diagnosis present

## 2017-07-31 DIAGNOSIS — F411 Generalized anxiety disorder: Secondary | ICD-10-CM | POA: Diagnosis not present

## 2017-07-31 DIAGNOSIS — K274 Chronic or unspecified peptic ulcer, site unspecified, with hemorrhage: Secondary | ICD-10-CM | POA: Diagnosis not present

## 2017-07-31 DIAGNOSIS — N179 Acute kidney failure, unspecified: Secondary | ICD-10-CM | POA: Diagnosis not present

## 2017-07-31 DIAGNOSIS — N390 Urinary tract infection, site not specified: Secondary | ICD-10-CM | POA: Diagnosis not present

## 2017-07-31 DIAGNOSIS — Z91040 Latex allergy status: Secondary | ICD-10-CM | POA: Diagnosis not present

## 2017-07-31 DIAGNOSIS — G8250 Quadriplegia, unspecified: Secondary | ICD-10-CM | POA: Diagnosis not present

## 2017-07-31 DIAGNOSIS — A4159 Other Gram-negative sepsis: Secondary | ICD-10-CM | POA: Diagnosis present

## 2017-07-31 DIAGNOSIS — J9600 Acute respiratory failure, unspecified whether with hypoxia or hypercapnia: Secondary | ICD-10-CM | POA: Diagnosis not present

## 2017-07-31 DIAGNOSIS — N12 Tubulo-interstitial nephritis, not specified as acute or chronic: Secondary | ICD-10-CM | POA: Diagnosis present

## 2017-07-31 DIAGNOSIS — S14109S Unspecified injury at unspecified level of cervical spinal cord, sequela: Secondary | ICD-10-CM | POA: Diagnosis not present

## 2017-07-31 DIAGNOSIS — N1330 Unspecified hydronephrosis: Secondary | ICD-10-CM | POA: Diagnosis not present

## 2017-07-31 DIAGNOSIS — R7881 Bacteremia: Secondary | ICD-10-CM | POA: Diagnosis present

## 2017-07-31 DIAGNOSIS — A419 Sepsis, unspecified organism: Secondary | ICD-10-CM | POA: Diagnosis not present

## 2017-07-31 DIAGNOSIS — R21 Rash and other nonspecific skin eruption: Secondary | ICD-10-CM | POA: Diagnosis not present

## 2017-07-31 DIAGNOSIS — Z87440 Personal history of urinary (tract) infections: Secondary | ICD-10-CM | POA: Diagnosis not present

## 2017-07-31 DIAGNOSIS — K2210 Ulcer of esophagus without bleeding: Secondary | ICD-10-CM | POA: Diagnosis present

## 2017-07-31 DIAGNOSIS — K264 Chronic or unspecified duodenal ulcer with hemorrhage: Secondary | ICD-10-CM | POA: Diagnosis present

## 2017-07-31 DIAGNOSIS — K269 Duodenal ulcer, unspecified as acute or chronic, without hemorrhage or perforation: Secondary | ICD-10-CM | POA: Diagnosis present

## 2017-07-31 DIAGNOSIS — A4151 Sepsis due to Escherichia coli [E. coli]: Secondary | ICD-10-CM | POA: Diagnosis present

## 2017-07-31 DIAGNOSIS — K250 Acute gastric ulcer with hemorrhage: Secondary | ICD-10-CM | POA: Diagnosis not present

## 2017-07-31 DIAGNOSIS — S14155S Other incomplete lesion at C5 level of cervical spinal cord, sequela: Secondary | ICD-10-CM | POA: Diagnosis not present

## 2017-07-31 DIAGNOSIS — E872 Acidosis: Secondary | ICD-10-CM | POA: Diagnosis present

## 2017-07-31 DIAGNOSIS — N189 Chronic kidney disease, unspecified: Secondary | ICD-10-CM | POA: Diagnosis not present

## 2017-07-31 DIAGNOSIS — K279 Peptic ulcer, site unspecified, unspecified as acute or chronic, without hemorrhage or perforation: Secondary | ICD-10-CM | POA: Diagnosis present

## 2017-07-31 DIAGNOSIS — R748 Abnormal levels of other serum enzymes: Secondary | ICD-10-CM | POA: Diagnosis present

## 2017-07-31 DIAGNOSIS — G8254 Quadriplegia, C5-C7 incomplete: Secondary | ICD-10-CM | POA: Diagnosis present

## 2017-07-31 DIAGNOSIS — R6520 Severe sepsis without septic shock: Secondary | ICD-10-CM | POA: Diagnosis present

## 2017-07-31 DIAGNOSIS — J90 Pleural effusion, not elsewhere classified: Secondary | ICD-10-CM | POA: Diagnosis not present

## 2017-07-31 DIAGNOSIS — K263 Acute duodenal ulcer without hemorrhage or perforation: Secondary | ICD-10-CM | POA: Diagnosis not present

## 2017-07-31 DIAGNOSIS — Z6838 Body mass index (BMI) 38.0-38.9, adult: Secondary | ICD-10-CM | POA: Diagnosis not present

## 2017-07-31 LAB — BASIC METABOLIC PANEL
ANION GAP: 13 (ref 5–15)
ANION GAP: 9 (ref 5–15)
BUN: 40 mg/dL — ABNORMAL HIGH (ref 6–20)
BUN: 33 mg/dL — ABNORMAL HIGH (ref 6–20)
CHLORIDE: 100 mmol/L — AB (ref 101–111)
CO2: 17 mmol/L — AB (ref 22–32)
CO2: 16 mmol/L — AB (ref 22–32)
Calcium: 6.9 mg/dL — ABNORMAL LOW (ref 8.9–10.3)
Calcium: 6.4 mg/dL — CL (ref 8.9–10.3)
Chloride: 108 mmol/L (ref 101–111)
Creatinine, Ser: 2.34 mg/dL — ABNORMAL HIGH (ref 0.44–1.00)
Creatinine, Ser: 1.63 mg/dL — ABNORMAL HIGH (ref 0.44–1.00)
GFR calc Af Amer: 46 mL/min — ABNORMAL LOW (ref >60)
GFR calc non Af Amer: 26 mL/min — ABNORMAL LOW (ref >60)
GFR calc non Af Amer: 40 mL/min — ABNORMAL LOW (ref >60)
GFR, EST AFRICAN AMERICAN: 30 mL/min — AB (ref >60)
GLUCOSE: 113 mg/dL — AB (ref 65–99)
GLUCOSE: 97 mg/dL (ref 65–99)
POTASSIUM: 3 mmol/L — AB (ref 3.5–5.1)
POTASSIUM: 3 mmol/L — AB (ref 3.5–5.1)
Sodium: 130 mmol/L — ABNORMAL LOW (ref 135–145)
Sodium: 133 mmol/L — ABNORMAL LOW (ref 135–145)

## 2017-07-31 LAB — CBC
HCT: 24.9 % — ABNORMAL LOW (ref 36.0–46.0)
Hemoglobin: 7.8 g/dL — ABNORMAL LOW (ref 12.0–15.0)
MCH: 19.8 pg — AB (ref 26.0–34.0)
MCHC: 31.3 g/dL (ref 30.0–36.0)
MCV: 63.2 fL — ABNORMAL LOW (ref 78.0–100.0)
PLATELETS: 497 10*3/uL — AB (ref 150–400)
RBC: 3.94 MIL/uL (ref 3.87–5.11)
RDW: 19.5 % — AB (ref 11.5–15.5)
WBC: 34.8 10*3/uL — ABNORMAL HIGH (ref 4.0–10.5)

## 2017-07-31 LAB — URINALYSIS, ROUTINE W REFLEX MICROSCOPIC
BILIRUBIN URINE: NEGATIVE
GLUCOSE, UA: NEGATIVE mg/dL
Ketones, ur: 5 mg/dL — AB
Nitrite: NEGATIVE
PH: 5 (ref 5.0–8.0)
Protein, ur: 30 mg/dL — AB
SPECIFIC GRAVITY, URINE: 1.015 (ref 1.005–1.030)

## 2017-07-31 LAB — I-STAT BETA HCG BLOOD, ED (MC, WL, AP ONLY): I-stat hCG, quantitative: <5 m[IU]/mL (ref <5)

## 2017-07-31 LAB — I-STAT CG4 LACTIC ACID, ED
LACTIC ACID, VENOUS: 5.32 mmol/L — AB (ref 0.5–1.9)
Lactic Acid, Venous: 1.22 mmol/L (ref 0.5–1.9)

## 2017-07-31 LAB — MRSA PCR SCREENING: MRSA by PCR: NEGATIVE

## 2017-07-31 LAB — TROPONIN I: Troponin I: 0.21 ng/mL (ref <0.03)

## 2017-07-31 MED ORDER — HEPARIN SODIUM (PORCINE) 5000 UNIT/ML IJ SOLN
5000.0000 [IU] | Freq: Three times a day (TID) | INTRAMUSCULAR | Status: DC
  Administered 2017-07-31 – 2017-08-02 (×5): 5000 [IU] via SUBCUTANEOUS
  Filled 2017-07-31 (×5): qty 1

## 2017-07-31 MED ORDER — SODIUM CHLORIDE 0.9 % IV BOLUS (SEPSIS): 1000.0000 mL | Freq: Once | INTRAVENOUS | Status: DC

## 2017-07-31 MED ORDER — LORAZEPAM 1 MG PO TABS
1.0000 mg | ORAL_TABLET | Freq: Four times a day (QID) | ORAL | Status: DC | PRN
  Administered 2017-08-02: 1 mg via ORAL
  Filled 2017-07-31: qty 1

## 2017-07-31 MED ORDER — MORPHINE SULFATE (PF) 4 MG/ML IV SOLN
4.0000 mg | Freq: Once | INTRAVENOUS | Status: AC
  Administered 2017-07-31: 4 mg via INTRAMUSCULAR
  Filled 2017-07-31: qty 1

## 2017-07-31 MED ORDER — SODIUM CHLORIDE 0.9% FLUSH
3.0000 mL | Freq: Two times a day (BID) | INTRAVENOUS | Status: DC
  Administered 2017-07-31 – 2017-08-03 (×4): 3 mL via INTRAVENOUS
  Administered 2017-08-05: 10 mL via INTRAVENOUS
  Administered 2017-08-08 – 2017-08-11 (×5): 3 mL via INTRAVENOUS

## 2017-07-31 MED ORDER — MORPHINE SULFATE (PF) 4 MG/ML IV SOLN
4.0000 mg | Freq: Once | INTRAVENOUS | Status: AC
  Administered 2017-07-31: 4 mg via INTRAVENOUS
  Filled 2017-07-31: qty 1

## 2017-07-31 MED ORDER — FENTANYL CITRATE (PF) 100 MCG/2ML IJ SOLN
100.0000 ug | Freq: Once | INTRAMUSCULAR | Status: AC
  Administered 2017-07-31: 100 ug via INTRAMUSCULAR

## 2017-07-31 MED ORDER — SODIUM CHLORIDE 0.9 % IV SOLN
Freq: Once | INTRAVENOUS | Status: AC
  Administered 2017-07-31: 16:00:00 via INTRAVENOUS

## 2017-07-31 MED ORDER — METOCLOPRAMIDE HCL 5 MG/ML IJ SOLN: 10.0000 mg | Freq: Once | INTRAMUSCULAR | Status: DC

## 2017-07-31 MED ORDER — POTASSIUM CHLORIDE CRYS ER 20 MEQ PO TBCR: 20.0000 meq | EXTENDED_RELEASE_TABLET | ORAL | Status: DC | PRN

## 2017-07-31 MED ORDER — MORPHINE SULFATE (PF) 4 MG/ML IV SOLN: 4.0000 mg | Freq: Once | INTRAVENOUS | Status: DC

## 2017-07-31 MED ORDER — SODIUM CHLORIDE 0.9 % IV SOLN
Freq: Once | INTRAVENOUS | Status: AC
  Administered 2017-07-31: 20:00:00 via INTRAVENOUS

## 2017-07-31 MED ORDER — POTASSIUM CHLORIDE IN NACL 40-0.9 MEQ/L-% IV SOLN
INTRAVENOUS | Status: DC
  Administered 2017-07-31 – 2017-08-01 (×3): 75 mL/h via INTRAVENOUS
  Filled 2017-07-31 (×3): qty 1000

## 2017-07-31 MED ORDER — DEXTROSE 5 % IV SOLN
1.0000 g | INTRAVENOUS | Status: DC
  Administered 2017-07-31 – 2017-08-01 (×2): 1 g via INTRAVENOUS
  Filled 2017-07-31 (×2): qty 10

## 2017-07-31 MED ORDER — LIDOCAINE-EPINEPHRINE (PF) 2 %-1:200000 IJ SOLN: 10.0000 mL | Freq: Once | INTRAMUSCULAR | Status: DC

## 2017-07-31 MED ORDER — PIPERACILLIN-TAZOBACTAM 3.375 G IVPB 30 MIN
3.3750 g | Freq: Once | INTRAVENOUS | Status: AC
  Administered 2017-07-31: 3.375 g via INTRAVENOUS
  Filled 2017-07-31: qty 50

## 2017-07-31 MED ORDER — ACETAMINOPHEN 325 MG PO TABS
650.0000 mg | ORAL_TABLET | Freq: Four times a day (QID) | ORAL | Status: DC | PRN
Start: 2017-07-31 — End: 2017-08-01
  Administered 2017-08-01: 650 mg via ORAL
  Filled 2017-07-31: qty 2

## 2017-07-31 MED ORDER — MORPHINE SULFATE (PF) 4 MG/ML IV SOLN: 4.0000 mg | INTRAVENOUS | Status: DC | PRN

## 2017-07-31 MED ORDER — ACETAMINOPHEN 500 MG PO TABS
1000.0000 mg | ORAL_TABLET | Freq: Once | ORAL | Status: AC
  Administered 2017-07-31: 1000 mg via ORAL
  Filled 2017-07-31: qty 2

## 2017-07-31 MED ORDER — LIDOCAINE HCL 1 % IJ SOLN
INTRAMUSCULAR | Status: AC
  Administered 2017-07-31: 14:00:00
  Filled 2017-07-31: qty 20

## 2017-07-31 MED ORDER — VANCOMYCIN HCL 10 G IV SOLR
1250.0000 mg | Freq: Once | INTRAVENOUS | Status: AC
  Administered 2017-07-31: 1250 mg via INTRAVENOUS
  Filled 2017-07-31: qty 1250

## 2017-07-31 MED ORDER — SODIUM CHLORIDE 0.9 % IV BOLUS (SEPSIS)
500.0000 mL | Freq: Once | INTRAVENOUS | Status: AC
  Administered 2017-07-31: 500 mL via INTRAVENOUS

## 2017-07-31 MED ORDER — SODIUM CHLORIDE 0.9 % IV BOLUS (SEPSIS)
2000.0000 mL | Freq: Once | INTRAVENOUS | Status: AC
  Administered 2017-07-31: 2000 mL via INTRAVENOUS

## 2017-07-31 MED ORDER — METOCLOPRAMIDE HCL 5 MG/ML IJ SOLN
10.0000 mg | Freq: Once | INTRAMUSCULAR | Status: AC
  Administered 2017-07-31: 10 mg via INTRAMUSCULAR
  Filled 2017-07-31: qty 2

## 2017-07-31 MED ORDER — FENTANYL CITRATE (PF) 100 MCG/2ML IJ SOLN
100.0000 ug | Freq: Once | INTRAMUSCULAR | Status: DC
  Filled 2017-07-31: qty 2

## 2017-07-31 MED ORDER — ONDANSETRON HCL 4 MG/2ML IJ SOLN
4.0000 mg | Freq: Once | INTRAMUSCULAR | Status: AC
  Administered 2017-07-31: 4 mg via INTRAVENOUS
  Filled 2017-07-31: qty 2

## 2017-07-31 MED ORDER — HYDROCORTISONE 1 % EX CREA
TOPICAL_CREAM | Freq: Two times a day (BID) | CUTANEOUS | Status: DC
  Administered 2017-08-01 – 2017-08-06 (×11): via TOPICAL
  Administered 2017-08-06: 1 via TOPICAL
  Administered 2017-08-07 (×2): via TOPICAL
  Administered 2017-08-08: 1 via TOPICAL
  Administered 2017-08-08: 12:00:00 via TOPICAL
  Filled 2017-07-31 (×2): qty 28
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
## 2017-07-31 NOTE — ED Notes (Signed)
Dr Venora Maples notified that IV access was lost during xray

## 2017-07-31 NOTE — ED Notes (Signed)
Pt transported to CT at this time.

## 2017-07-31 NOTE — ED Notes (Signed)
This Probation officer attempt to collect labs. unsuccessful main lab have been called

## 2017-07-31 NOTE — ED Notes (Signed)
Radiology notified that pt ready for Xray. HCG negative

## 2017-07-31 NOTE — Progress Notes (Signed)
CRITICAL VALUE ALERT  Critical Value:  Troponin 0.21  Date & Time Notied:  2/91/19 @ 2153  Provider Notified: X. Blount, NP  Orders Received/Actions taken: Awaiting response

## 2017-07-31 NOTE — ED Notes (Signed)
Pt refused FLU swab. Pt states "you aint sticking that thing up mu nose to my brain! That's a no go" Pt requesting more pain meds and nausea meds, stating " Everything is the same. Nothing has changed" MD made aware.

## 2017-07-31 NOTE — ED Notes (Signed)
Lab at bedside attempting to obtain blood

## 2017-07-31 NOTE — Progress Notes (Signed)
CRITICAL VALUE ALERT  Critical Value:  Calcium 6.4 and Potassium 3.0  Date & Time Notied:  07/31/17 @ 2100  Provider Notified: X. Blount, NP  Orders Received/Actions taken: Potassium added to MIVF.

## 2017-07-31 NOTE — Progress Notes (Signed)
PHARMACY NOTE -  CEFTRIAXONE  Pharmacy has been consulted to dose Ceftriaxone for UTI.  Patient received Vancomycin 1250mg  x 1 (@ 14:22) and Zosyn 3.375gm x 1 (@1406 ) in the ED.  SCr = 2.34 (Ceftriaxone requires no dosage adjustment for renal function)  Plan: Ceftriaxone 1gm IV q24h Need for further dosage adjustment appears unlikely at present.    Will sign off at this time.  Please reconsult if a change in clinical status warrants re-evaluation of dosage.  Leone Haven, PharmD 07/31/17 @ 15:24

## 2017-07-31 NOTE — ED Notes (Signed)
Hospitalist notified that pt needs IV access to receive abx and fluids. Dr Venora Maples reports that he will place a line in pt. Abx and fluids delayed d/t no access.
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
## 2017-07-31 NOTE — ED Notes (Signed)
Pt in x-ray at this time

## 2017-07-31 NOTE — ED Notes (Signed)
Xray notified need for Xray. Per xray: on the way at this time.

## 2017-08-01 ENCOUNTER — Inpatient Hospital Stay (HOSPITAL_COMMUNITY): Payer: Medicare Other

## 2017-08-01 DIAGNOSIS — R509 Fever, unspecified: Secondary | ICD-10-CM

## 2017-08-01 LAB — MAGNESIUM: Magnesium: 1.5 mg/dL — ABNORMAL LOW (ref 1.7–2.4)

## 2017-08-01 LAB — CBC
HCT: 15.7 % — ABNORMAL LOW (ref 36.0–46.0)
HCT: 15.9 % — ABNORMAL LOW (ref 36.0–46.0)
HEMOGLOBIN: 5.1 g/dL — AB (ref 12.0–15.0)
Hemoglobin: 5 g/dL — CL (ref 12.0–15.0)
MCH: 19.9 pg — AB (ref 26.0–34.0)
MCH: 20 pg — AB (ref 26.0–34.0)
MCHC: 31.8 g/dL (ref 30.0–36.0)
MCHC: 32.1 g/dL (ref 30.0–36.0)
MCV: 62.5 fL — ABNORMAL LOW (ref 78.0–100.0)
MCV: 62.4 fL — AB (ref 78.0–100.0)
PLATELETS: 317 10*3/uL (ref 150–400)
Platelets: 304 10*3/uL (ref 150–400)
RBC: 2.51 MIL/uL — AB (ref 3.87–5.11)
RBC: 2.55 MIL/uL — ABNORMAL LOW (ref 3.87–5.11)
RDW: 19.1 % — AB (ref 11.5–15.5)
RDW: 19.1 % — ABNORMAL HIGH (ref 11.5–15.5)
WBC: 15.9 10*3/uL — ABNORMAL HIGH (ref 4.0–10.5)
WBC: 14.2 10*3/uL — AB (ref 4.0–10.5)

## 2017-08-01 LAB — TROPONIN I
TROPONIN I: 0.11 ng/mL — AB (ref <0.03)
TROPONIN I: 0.06 ng/mL — AB (ref <0.03)
Troponin I: 0.17 ng/mL (ref <0.03)

## 2017-08-01 LAB — PREPARE RBC (CROSSMATCH)

## 2017-08-01 LAB — COMPREHENSIVE METABOLIC PANEL
ALT: 28 U/L (ref 14–54)
ANION GAP: 7 (ref 5–15)
AST: 69 U/L — ABNORMAL HIGH (ref 15–41)
Albumin: 2.3 g/dL — ABNORMAL LOW (ref 3.5–5.0)
Alkaline Phosphatase: 64 U/L (ref 38–126)
BUN: 22 mg/dL — ABNORMAL HIGH (ref 6–20)
CHLORIDE: 108 mmol/L (ref 101–111)
CO2: 19 mmol/L — AB (ref 22–32)
Calcium: 6.5 mg/dL — ABNORMAL LOW (ref 8.9–10.3)
Creatinine, Ser: 1.04 mg/dL — ABNORMAL HIGH (ref 0.44–1.00)
Glucose, Bld: 111 mg/dL — ABNORMAL HIGH (ref 65–99)
POTASSIUM: 3.1 mmol/L — AB (ref 3.5–5.1)
SODIUM: 134 mmol/L — AB (ref 135–145)
Total Bilirubin: 0.4 mg/dL (ref 0.3–1.2)
Total Protein: 5.5 g/dL — ABNORMAL LOW (ref 6.5–8.1)

## 2017-08-01 LAB — ECHOCARDIOGRAM COMPLETE
Height: 65 in
WEIGHTICAEL: 3474.4496 [oz_av]

## 2017-08-01 LAB — PHOSPHORUS: Phosphorus: 1.4 mg/dL — ABNORMAL LOW (ref 2.5–4.6)

## 2017-08-01 MED ORDER — OSELTAMIVIR PHOSPHATE 75 MG PO CAPS
75.0000 mg | ORAL_CAPSULE | Freq: Two times a day (BID) | ORAL | Status: DC
  Administered 2017-08-01 – 2017-08-02 (×3): 75 mg via ORAL
  Filled 2017-08-01 (×3): qty 1

## 2017-08-01 MED ORDER — POTASSIUM PHOSPHATES 15 MMOLE/5ML IV SOLN
20.0000 mmol | Freq: Once | INTRAVENOUS | Status: AC
  Administered 2017-08-01: 20 mmol via INTRAVENOUS
  Filled 2017-08-01: qty 6.67

## 2017-08-01 MED ORDER — CHLORHEXIDINE GLUCONATE CLOTH 2 % EX PADS
6.0000 | MEDICATED_PAD | Freq: Every morning | CUTANEOUS | Status: DC
  Administered 2017-08-02 – 2017-08-04 (×3): 6 via TOPICAL

## 2017-08-01 MED ORDER — POTASSIUM CHLORIDE CRYS ER 20 MEQ PO TBCR
40.0000 meq | EXTENDED_RELEASE_TABLET | Freq: Once | ORAL | Status: AC
  Administered 2017-08-01: 40 meq via ORAL
  Filled 2017-08-01: qty 2

## 2017-08-01 MED ORDER — SODIUM CHLORIDE 0.9 % IV SOLN
2.0000 g | Freq: Once | INTRAVENOUS | Status: AC
  Administered 2017-08-01: 2 g via INTRAVENOUS
  Filled 2017-08-01: qty 20

## 2017-08-01 MED ORDER — ACETAMINOPHEN 500 MG PO TABS
1000.0000 mg | ORAL_TABLET | Freq: Three times a day (TID) | ORAL | Status: DC
  Administered 2017-08-01 – 2017-08-02 (×4): 1000 mg via ORAL
  Filled 2017-08-01 (×4): qty 2

## 2017-08-01 MED ORDER — SODIUM CHLORIDE 0.9% FLUSH
10.0000 mL | Freq: Two times a day (BID) | INTRAVENOUS | Status: DC
  Administered 2017-08-02 – 2017-08-03 (×2): 10 mL
  Administered 2017-08-04: 30 mL
  Administered 2017-08-07: 10 mL

## 2017-08-01 MED ORDER — SODIUM CHLORIDE 0.9% FLUSH: 10.0000 mL | INTRAVENOUS | Status: DC | PRN

## 2017-08-01 MED ORDER — SODIUM CHLORIDE 0.9 % IV SOLN
Freq: Once | INTRAVENOUS | Status: AC
  Administered 2017-08-01: 11:00:00 via INTRAVENOUS

## 2017-08-01 MED ORDER — MAGNESIUM SULFATE 4 GM/100ML IV SOLN
4.0000 g | Freq: Once | INTRAVENOUS | Status: AC
Start: 2017-08-01 — End: 2017-08-01
  Administered 2017-08-01: 4 g via INTRAVENOUS
  Filled 2017-08-01: qty 100

## 2017-08-01 MED ORDER — IBUPROFEN 200 MG PO TABS
400.0000 mg | ORAL_TABLET | Freq: Four times a day (QID) | ORAL | Status: DC | PRN
  Administered 2017-08-01 – 2017-08-02 (×2): 400 mg via ORAL
  Filled 2017-08-01 (×2): qty 2

## 2017-08-01 NOTE — Progress Notes (Signed)
CRITICAL VALUE ALERT  Critical Value:  HBG 5.0  Date & Time Notied: 08/01/17 @ [phone]  Provider Notified: Dr. Sheran Fava  Orders Received/Actions taken: CBC repeated, Type & Screen ordered

## 2017-08-01 NOTE — Progress Notes (Signed)
Pt temperature is currently 103.1. MD aware, still consents to pt receiving blood transfusion. Will continue to monitor
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
## 2017-08-01 NOTE — Progress Notes (Signed)
  Echocardiogram 2D Echocardiogram has been performed.  Jennette Dubin 08/01/2017, 2:15 PM

## 2017-08-02 ENCOUNTER — Inpatient Hospital Stay (HOSPITAL_COMMUNITY): Payer: Medicare Other

## 2017-08-02 ENCOUNTER — Encounter (HOSPITAL_COMMUNITY)
Admission: EM | Disposition: A | Discharge status: Home Health | Payer: Self-pay | Source: Home / Self Care | Attending: Internal Medicine

## 2017-08-02 DIAGNOSIS — J9600 Acute respiratory failure, unspecified whether with hypoxia or hypercapnia: Secondary | ICD-10-CM

## 2017-08-02 HISTORY — PX: ESOPHAGOGASTRODUODENOSCOPY: SHX5428

## 2017-08-02 LAB — FIBRINOGEN: Fibrinogen: 495 mg/dL — ABNORMAL HIGH (ref 210–475)

## 2017-08-02 LAB — BLOOD GAS, ARTERIAL
Acid-base deficit: 6.6 mmol/L — ABNORMAL HIGH (ref 0.0–2.0)
BICARBONATE: 17.1 mmol/L — AB (ref 20.0–28.0)
Drawn by: 441261
FIO2: 21
O2 SAT: 97.6 %
PATIENT TEMPERATURE: 37
PCO2 ART: 27.8 mmHg — AB (ref 32.0–48.0)
PO2 ART: 97 mmHg (ref 83.0–108.0)
pH, Arterial: 7.406 (ref 7.350–7.450)

## 2017-08-02 LAB — RENAL FUNCTION PANEL
ALBUMIN: 2.6 g/dL — AB (ref 3.5–5.0)
Anion gap: 9 (ref 5–15)
BUN: 11 mg/dL (ref 6–20)
CALCIUM: 7.5 mg/dL — AB (ref 8.9–10.3)
CHLORIDE: 110 mmol/L (ref 101–111)
CO2: 17 mmol/L — ABNORMAL LOW (ref 22–32)
CREATININE: 0.64 mg/dL (ref 0.44–1.00)
GFR calc Af Amer: >60 mL/min (ref >60)
GFR calc non Af Amer: >60 mL/min (ref >60)
Glucose, Bld: 114 mg/dL — ABNORMAL HIGH (ref 65–99)
Phosphorus: 1.1 mg/dL — ABNORMAL LOW (ref 2.5–4.6)
Potassium: 4.5 mmol/L (ref 3.5–5.1)
SODIUM: 136 mmol/L (ref 135–145)

## 2017-08-02 LAB — PROTIME-INR
INR: 1.27
PROTHROMBIN TIME: 15.8 s — AB (ref 11.4–15.2)

## 2017-08-02 LAB — CBC
HCT: 24.4 % — ABNORMAL LOW (ref 36.0–46.0)
HCT: 13.6 % — ABNORMAL LOW (ref 36.0–46.0)
HCT: 13 % — ABNORMAL LOW (ref 36.0–46.0)
HEMOGLOBIN: 4.5 g/dL — AB (ref 12.0–15.0)
Hemoglobin: 8 g/dL — ABNORMAL LOW (ref 12.0–15.0)
Hemoglobin: 4.3 g/dL — CL (ref 12.0–15.0)
MCH: 22.7 pg — ABNORMAL LOW (ref 26.0–34.0)
MCH: 23.1 pg — AB (ref 26.0–34.0)
MCH: 22.9 pg — ABNORMAL LOW (ref 26.0–34.0)
MCHC: 32.8 g/dL (ref 30.0–36.0)
MCHC: 33.1 g/dL (ref 30.0–36.0)
MCHC: 33.1 g/dL (ref 30.0–36.0)
MCV: 69.3 fL — AB (ref 78.0–100.0)
MCV: 69.7 fL — AB (ref 78.0–100.0)
MCV: 69.1 fL — ABNORMAL LOW (ref 78.0–100.0)
PLATELETS: 337 10*3/uL (ref 150–400)
PLATELETS: 249 10*3/uL (ref 150–400)
Platelets: 217 10*3/uL (ref 150–400)
RBC: 3.52 MIL/uL — ABNORMAL LOW (ref 3.87–5.11)
RBC: 1.95 MIL/uL — AB (ref 3.87–5.11)
RBC: 1.88 MIL/uL — ABNORMAL LOW (ref 3.87–5.11)
RDW: 22.3 % — AB (ref 11.5–15.5)
RDW: 22.3 % — ABNORMAL HIGH (ref 11.5–15.5)
RDW: 22.4 % — AB (ref 11.5–15.5)
WBC: 7.2 10*3/uL (ref 4.0–10.5)
WBC: 7.8 10*3/uL (ref 4.0–10.5)
WBC: 10.1 10*3/uL (ref 4.0–10.5)

## 2017-08-02 LAB — BLOOD CULTURE ID PANEL (REFLEXED)
Acinetobacter baumannii: NOT DETECTED
CANDIDA GLABRATA: NOT DETECTED
CANDIDA KRUSEI: NOT DETECTED
CANDIDA TROPICALIS: NOT DETECTED
Candida albicans: NOT DETECTED
Candida parapsilosis: NOT DETECTED
Carbapenem resistance: NOT DETECTED
ESCHERICHIA COLI: DETECTED — AB
Enterobacter cloacae complex: NOT DETECTED
Enterobacteriaceae species: DETECTED — AB
Enterococcus species: NOT DETECTED
Haemophilus influenzae: NOT DETECTED
KLEBSIELLA OXYTOCA: NOT DETECTED
Klebsiella pneumoniae: NOT DETECTED
LISTERIA MONOCYTOGENES: NOT DETECTED
Methicillin resistance: NOT DETECTED
Neisseria meningitidis: NOT DETECTED
PROTEUS SPECIES: NOT DETECTED
Pseudomonas aeruginosa: NOT DETECTED
SERRATIA MARCESCENS: NOT DETECTED
STREPTOCOCCUS PYOGENES: NOT DETECTED
Staphylococcus aureus (BCID): NOT DETECTED
Staphylococcus species: NOT DETECTED
Streptococcus agalactiae: NOT DETECTED
Streptococcus pneumoniae: NOT DETECTED
Streptococcus species: NOT DETECTED
Vancomycin resistance: NOT DETECTED

## 2017-08-02 LAB — CBC WITH DIFFERENTIAL/PLATELET
BASOS ABS: 0 10*3/uL (ref 0.0–0.1)
Basophils Relative: 0 %
EOS ABS: 0.2 10*3/uL (ref 0.0–0.7)
EOS PCT: 2 %
HCT: 24.9 % — ABNORMAL LOW (ref 36.0–46.0)
Hemoglobin: 8.2 g/dL — ABNORMAL LOW (ref 12.0–15.0)
LYMPHS ABS: 1.5 10*3/uL (ref 0.7–4.0)
Lymphocytes Relative: 14 %
MCH: 22.7 pg — ABNORMAL LOW (ref 26.0–34.0)
MCHC: 32.9 g/dL (ref 30.0–36.0)
MCV: 68.8 fL — AB (ref 78.0–100.0)
MONO ABS: 0.8 10*3/uL (ref 0.1–1.0)
Monocytes Relative: 7 %
Neutro Abs: 8.3 10*3/uL — ABNORMAL HIGH (ref 1.7–7.7)
Neutrophils Relative %: 77 %
PLATELETS: 318 10*3/uL (ref 150–400)
RBC: 3.62 MIL/uL — AB (ref 3.87–5.11)
RDW: 22.3 % — AB (ref 11.5–15.5)
WBC: 10.8 10*3/uL — AB (ref 4.0–10.5)

## 2017-08-02 LAB — BLOOD GAS, VENOUS
Acid-base deficit: 6.3 mmol/L — ABNORMAL HIGH (ref 0.0–2.0)
BICARBONATE: 17.8 mmol/L — AB (ref 20.0–28.0)
O2 Saturation: 56.7 %
PCO2 VEN: 30.5 mmHg — AB (ref 44.0–60.0)
PH VEN: 7.383 (ref 7.250–7.430)
PO2 VEN: 32.8 mmHg (ref 32.0–45.0)
Patient temperature: 98.6

## 2017-08-02 LAB — INFLUENZA PANEL BY PCR (TYPE A & B)
INFLBPCR: NEGATIVE
Influenza A By PCR: NEGATIVE

## 2017-08-02 LAB — BASIC METABOLIC PANEL WITH GFR
Anion gap: 10 (ref 5–15)
BUN: 23 mg/dL — ABNORMAL HIGH (ref 6–20)
CO2: 18 mmol/L — ABNORMAL LOW (ref 22–32)
Calcium: 6.4 mg/dL — CL (ref 8.9–10.3)
Chloride: 111 mmol/L (ref 101–111)
Creatinine, Ser: 0.61 mg/dL (ref 0.44–1.00)
GFR calc Af Amer: >60 mL/min
GFR calc non Af Amer: >60 mL/min
Glucose, Bld: 134 mg/dL — ABNORMAL HIGH (ref 65–99)
Potassium: 4.4 mmol/L (ref 3.5–5.1)
Sodium: 139 mmol/L (ref 135–145)

## 2017-08-02 LAB — BASIC METABOLIC PANEL
ANION GAP: 8 (ref 5–15)
BUN: 23 mg/dL — ABNORMAL HIGH (ref 6–20)
CHLORIDE: 111 mmol/L (ref 101–111)
CO2: 17 mmol/L — AB (ref 22–32)
CREATININE: 0.65 mg/dL (ref 0.44–1.00)
Calcium: 6.6 mg/dL — ABNORMAL LOW (ref 8.9–10.3)
GFR calc Af Amer: >60 mL/min (ref >60)
GFR calc non Af Amer: >60 mL/min (ref >60)
GLUCOSE: 102 mg/dL — AB (ref 65–99)
Potassium: 4.5 mmol/L (ref 3.5–5.1)
Sodium: 136 mmol/L (ref 135–145)

## 2017-08-02 LAB — LACTIC ACID, PLASMA
Lactic Acid, Venous: 2.7 mmol/L (ref 0.5–1.9)
Lactic Acid, Venous: 1.4 mmol/L (ref 0.5–1.9)

## 2017-08-02 LAB — TSH: TSH: 0.497 u[IU]/mL (ref 0.350–4.500)

## 2017-08-02 LAB — MAGNESIUM: MAGNESIUM: 2.2 mg/dL (ref 1.7–2.4)

## 2017-08-02 LAB — LIPASE, BLOOD: Lipase: 22 U/L (ref 11–51)

## 2017-08-02 LAB — HIV ANTIBODY (ROUTINE TESTING W REFLEX): HIV Screen 4th Generation wRfx: NONREACTIVE

## 2017-08-02 LAB — CORTISOL: CORTISOL PLASMA: 14.6 ug/dL

## 2017-08-02 LAB — APTT: aPTT: 44 seconds — ABNORMAL HIGH (ref 24–36)

## 2017-08-02 LAB — PREPARE RBC (CROSSMATCH)

## 2017-08-02 LAB — T4, FREE: FREE T4: 0.82 ng/dL (ref 0.61–1.12)

## 2017-08-02 SURGERY — EGD (ESOPHAGOGASTRODUODENOSCOPY)
Anesthesia: Moderate Sedation

## 2017-08-02 MED ORDER — PANTOPRAZOLE SODIUM 40 MG IV SOLR
80.0000 mg | Freq: Once | INTRAVENOUS | Status: AC
  Administered 2017-08-02: 80 mg via INTRAVENOUS
  Filled 2017-08-02: qty 80

## 2017-08-02 MED ORDER — LORAZEPAM 2 MG/ML IJ SOLN
1.0000 mg | INTRAMUSCULAR | Status: DC | PRN
Start: 2017-08-02 — End: 2017-08-02
  Administered 2017-08-02: 1 mg via INTRAVENOUS
  Filled 2017-08-02: qty 1

## 2017-08-02 MED ORDER — FENTANYL CITRATE (PF) 100 MCG/2ML IJ SOLN
INTRAMUSCULAR | Status: AC
  Filled 2017-08-02: qty 4

## 2017-08-02 MED ORDER — ENOXAPARIN SODIUM 40 MG/0.4ML ~~LOC~~ SOLN
40.0000 mg | SUBCUTANEOUS | Status: DC
  Administered 2017-08-02: 40 mg via SUBCUTANEOUS
  Filled 2017-08-02: qty 0.4

## 2017-08-02 MED ORDER — SODIUM CHLORIDE 0.9 % IJ SOLN
PREFILLED_SYRINGE | INTRAMUSCULAR | Status: DC | PRN
  Administered 2017-08-02: 1.5 mL

## 2017-08-02 MED ORDER — FENTANYL CITRATE (PF) 100 MCG/2ML IJ SOLN
INTRAMUSCULAR | Status: DC | PRN
  Administered 2017-08-02: 50 ug via INTRAVENOUS

## 2017-08-02 MED ORDER — KCL IN DEXTROSE-NACL 10-5-0.45 MEQ/L-%-% IV SOLN
INTRAVENOUS | Status: DC
  Administered 2017-08-02 – 2017-08-08 (×9): via INTRAVENOUS
  Filled 2017-08-02 (×16): qty 1000

## 2017-08-02 MED ORDER — FENTANYL CITRATE (PF) 100 MCG/2ML IJ SOLN
INTRAMUSCULAR | Status: AC
  Administered 2017-08-02: 100 ug
  Filled 2017-08-02: qty 2

## 2017-08-02 MED ORDER — SUCCINYLCHOLINE CHLORIDE 20 MG/ML IJ SOLN
100.0000 mg | Freq: Once | INTRAMUSCULAR | Status: AC
  Administered 2017-08-02: 100 mg via INTRAVENOUS
  Filled 2017-08-02: qty 5

## 2017-08-02 MED ORDER — SODIUM CHLORIDE 0.9 % IV SOLN
Freq: Once | INTRAVENOUS | Status: AC
  Administered 2017-08-02: 17:00:00 via INTRAVENOUS

## 2017-08-02 MED ORDER — PANTOPRAZOLE SODIUM 40 MG IV SOLR: 40.0000 mg | Freq: Two times a day (BID) | INTRAVENOUS | Status: DC

## 2017-08-02 MED ORDER — SODIUM CHLORIDE 0.9 % IV SOLN
0.5000 mg/h | INTRAVENOUS | Status: DC
  Administered 2017-08-02: 1 mg/h via INTRAVENOUS
  Administered 2017-08-03: 4 mg/h via INTRAVENOUS
  Administered 2017-08-03 – 2017-08-04 (×2): 5 mg/h via INTRAVENOUS
  Administered 2017-08-04: 5.5 mg/h via INTRAVENOUS
  Filled 2017-08-02 (×6): qty 10

## 2017-08-02 MED ORDER — DEXTROSE 5 % IV SOLN
30.0000 mmol | Freq: Once | INTRAVENOUS | Status: AC
  Administered 2017-08-02: 30 mmol via INTRAVENOUS
  Filled 2017-08-02: qty 10

## 2017-08-02 MED ORDER — MIDAZOLAM HCL 2 MG/2ML IJ SOLN
2.5000 mg | Freq: Once | INTRAMUSCULAR | Status: AC
  Administered 2017-08-02: 2.5 mg via INTRAVENOUS

## 2017-08-02 MED ORDER — FENTANYL CITRATE (PF) 100 MCG/2ML IJ SOLN
25.0000 ug | INTRAMUSCULAR | Status: DC | PRN
  Administered 2017-08-02 – 2017-08-03 (×5): 100 ug via INTRAVENOUS
  Administered 2017-08-04: 50 ug via INTRAVENOUS
  Administered 2017-08-04 – 2017-08-10 (×23): 100 ug via INTRAVENOUS
  Filled 2017-08-02 (×30): qty 2

## 2017-08-02 MED ORDER — PANTOPRAZOLE SODIUM 40 MG IV SOLR
8.0000 mg/h | INTRAVENOUS | Status: DC
  Administered 2017-08-02 – 2017-08-04 (×4): 8 mg/h via INTRAVENOUS
  Filled 2017-08-02 (×7): qty 80

## 2017-08-02 MED ORDER — SODIUM CHLORIDE 0.9 % IV BOLUS (SEPSIS)
1000.0000 mL | Freq: Once | INTRAVENOUS | Status: AC
  Administered 2017-08-02: 1000 mL via INTRAVENOUS

## 2017-08-02 MED ORDER — LIDOCAINE HCL 2 % EX GEL
1.0000 "application " | Freq: Once | CUTANEOUS | Status: DC
  Filled 2017-08-02: qty 5

## 2017-08-02 MED ORDER — SODIUM CHLORIDE 0.9 % IV SOLN: INTRAVENOUS | Status: DC

## 2017-08-02 MED ORDER — MIDAZOLAM HCL 2 MG/2ML IJ SOLN
INTRAMUSCULAR | Status: AC
  Administered 2017-08-02: 2 mg
  Filled 2017-08-02: qty 4

## 2017-08-02 MED ORDER — SODIUM CHLORIDE 0.9 % IV SOLN
2.0000 g | INTRAVENOUS | Status: DC
  Administered 2017-08-02 – 2017-08-04 (×3): 2 g via INTRAVENOUS
  Filled 2017-08-02: qty 20
  Filled 2017-08-02: qty 2
  Filled 2017-08-02 (×2): qty 20

## 2017-08-02 MED ORDER — CHLORHEXIDINE GLUCONATE 0.12% ORAL RINSE (MEDLINE KIT)
15.0000 mL | Freq: Two times a day (BID) | OROMUCOSAL | Status: DC
  Administered 2017-08-03 – 2017-08-07 (×9): 15 mL via OROMUCOSAL

## 2017-08-02 MED ORDER — NOREPINEPHRINE 4 MG/250ML-% IV SOLN
0.0000 ug/min | INTRAVENOUS | Status: DC
  Administered 2017-08-02: 12 ug/min via INTRAVENOUS
  Administered 2017-08-02: 5 ug/min via INTRAVENOUS
  Administered 2017-08-03: 11 ug/min via INTRAVENOUS
  Administered 2017-08-03: 20 ug/min via INTRAVENOUS
  Administered 2017-08-03 (×2): 15 ug/min via INTRAVENOUS
  Administered 2017-08-04: 8 ug/min via INTRAVENOUS
  Administered 2017-08-04: 7 ug/min via INTRAVENOUS
  Filled 2017-08-02 (×9): qty 250

## 2017-08-02 MED ORDER — MIDAZOLAM HCL 2 MG/2ML IJ SOLN
1.0000 mg | INTRAMUSCULAR | Status: DC | PRN
  Administered 2017-08-05 – 2017-08-07 (×6): 1 mg via INTRAVENOUS
  Filled 2017-08-02 (×6): qty 2

## 2017-08-02 MED ORDER — ETOMIDATE 2 MG/ML IV SOLN
20.0000 mg | Freq: Once | INTRAVENOUS | Status: AC
  Administered 2017-08-02: 20 mg via INTRAVENOUS

## 2017-08-02 MED ORDER — EPINEPHRINE PF 1 MG/10ML IJ SOSY
PREFILLED_SYRINGE | INTRAMUSCULAR | Status: AC
  Filled 2017-08-02: qty 10

## 2017-08-02 MED ORDER — BENZOCAINE 20 % MT AERO
INHALATION_SPRAY | Freq: Once | OROMUCOSAL | Status: AC
  Administered 2017-08-02: 16:00:00 via OROMUCOSAL
  Filled 2017-08-02: qty 57

## 2017-08-02 MED ORDER — SODIUM CHLORIDE 0.9 % IV SOLN
INTRAVENOUS | Status: DC
  Administered 2017-08-02 (×2): via INTRAVENOUS

## 2017-08-02 MED ORDER — DEXTROSE 5 % IV SOLN
1.0000 g | Freq: Once | INTRAVENOUS | Status: AC
  Administered 2017-08-02: 1 g via INTRAVENOUS
  Filled 2017-08-02: qty 10

## 2017-08-02 MED ORDER — SODIUM CHLORIDE 0.9 % IV SOLN
0.5000 mg/h | INTRAVENOUS | Status: DC
  Administered 2017-08-02: 0.5 mg/h via INTRAVENOUS
  Filled 2017-08-02: qty 10

## 2017-08-02 MED ORDER — DEXTROSE 5 % IV SOLN: 2.0000 g | INTRAVENOUS | Status: DC

## 2017-08-02 MED ORDER — ACETAMINOPHEN 10 MG/ML IV SOLN
1000.0000 mg | Freq: Four times a day (QID) | INTRAVENOUS | Status: AC | PRN
  Administered 2017-08-03: 1000 mg via INTRAVENOUS
  Filled 2017-08-02: qty 100

## 2017-08-02 MED ORDER — MIDAZOLAM HCL 5 MG/ML IJ SOLN
INTRAMUSCULAR | Status: AC
Start: 2017-08-02 — End: ?
  Filled 2017-08-02: qty 3

## 2017-08-02 MED ORDER — ORAL CARE MOUTH RINSE
15.0000 mL | Freq: Four times a day (QID) | OROMUCOSAL | Status: DC
  Administered 2017-08-03 – 2017-08-07 (×13): 15 mL via OROMUCOSAL
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
## 2017-08-02 NOTE — Progress Notes (Signed)
CRITICAL VALUE ALERT  Critical Value:  Lactic acid 2.7  Date & Time Notied:  08/02/17  Provider Notified: Yes  Orders Received/Actions taken: NS 1L bolus; repeat lactic acid.

## 2017-08-02 NOTE — Progress Notes (Signed)
Received verbal for 3 blood products to run at 990mL/hr by MD. Will monitor.

## 2017-08-02 NOTE — Progress Notes (Signed)
This afternoon I called critical care for a second opinion given the very complicated nature of Mrs. Tracey Hall's illness.  The nurse practitioner Marni Griffon was able to place an NG tube secondary to the patient's abdominal distention and concern for ileus.  He also drew an ABG.  I was notified that the NG tube was placed to suction put out a small amount of dark blood.  The ABG demonstrated no evidence of AA gradient and so PE was thought to be less likely.  The CT angiogram that was ordered earlier was canceled.  Incidentally on ABG the hemoglobin was detected to be 5.  Stat CBC was obtained which confirmed that the patient had dropped her hemoglobin by 4 points over the last couple of hours.  She was also becoming somewhat hypotensive.  She was started on a PPI drip and GI was consulted.  She was ordered for 4 units of PRBCs.  The patient was already receiving a bolus because her lactic acid level this afternoon was somewhat elevated.  The patient became somewhat agitated and pulled out her NG tube.  Critical care was notified that the patient may need to be intubated for airway protection and they were present at bedside when she had an acute clinical decline.  She developed melena and hypotension.  The critical care physician Dr. Debbora Dus was successful in intubating the patient and is taking over care at this time.  The patient is intubated on vasopressors.  [facility] is aware of this patient.  They would prefer that the patient have a hemoglobin of around 7 g/dL prior to endoscopy, but if there is need for urgent endoscopy, Dr. Penelope Coop is on-call overnight and would like to be notified.  The hospitalist service will sign off.  Please let us know when the patient is ready to transfer back out to the hospitalist service.  Thanks to the critical care physicians and ICU RNs for your prompt attention and assistance with this complicated patient.

## 2017-08-02 NOTE — Procedures (Signed)
Intubation The patient developed worsening mental status and hemodynamic instability. It was clear she required urgent intubation. The bedside was prepared. The respiratory therapist attempted intubation after succinylcholuine 100mg , Etomidate 20 mg and versed 2 ng. She was unsuccessful. The opatient than spewed out bloody mateerial into the upper airway. She was bag masked and the upper airway viogorously suctioned. I than used a #3 blade with he glidescope and was able to visualize the cords a bnd successfully intubate her. Color change seen on the CO2 qualitative to yellow.

## 2017-08-02 NOTE — Progress Notes (Signed)
OG tube advanced about 8-10 cm per recommendations after xray.

## 2017-08-02 NOTE — Progress Notes (Signed)
PHARMACY CONSULT: Lovenox for VTE prophylaxis   Wt: 98.5kg BMI:  36 Scr:  0.64 CrCl >30 ml/hr  H/H: 8/24.4 Pltc: 337  A/P:  Begin standard dose lovenox 40mg  sq q24h  No further dosing adjustments needed, Pharmacy will sign off   Peggyann Juba, PharmD, BCPS Pager: [phone] 08/02/2017 1:45 PM

## 2017-08-02 NOTE — Op Note (Signed)
[facility] Patient Name: Tracey Hall Procedure Date: 08/02/2017 MRN: [id number] Attending MD: Wonda Horner , MD Date of Birth: May 30, 1981 CSN: [id number] Age: 37 Admit Type: Inpatient Procedure:                Upper GI endoscopy Indications:              Hematemesis Providers:                Wonda Horner, MD, Angus Seller Tinnie Gens,                            Technician Referring MD:              Medicines:                Fentanyl 50 micrograms IV, Midazolam 4 mg IV, plus                            an iv drip in icu. Complications:            No immediate complications. Estimated Blood Loss:     Estimated blood loss: none. Procedure:                Pre-Anesthesia Assessment:                           - Prior to the procedure, a History and Physical                            was performed, and patient medications and                            allergies were reviewed. The patient's tolerance of                            previous anesthesia was also reviewed. The risks                            and benefits of the procedure and the sedation                            options and risks were discussed with the patient.                            All questions were answered, and informed consent                            was obtained. Prior Anticoagulants: The patient has                            taken no previous anticoagulant or antiplatelet                            agents. ASA Grade Assessment: III - A patient with  severe systemic disease. After reviewing the risks                            and benefits, the patient was deemed in                            satisfactory condition to undergo the procedure.                           After obtaining informed consent, the endoscope was                            passed under direct vision. Throughout the                            procedure, the patient's blood pressure, pulse,  and                            oxygen saturations were monitored continuously. The                            EG-[id number] ([id number]) scope was introduced through the                            mouth, and advanced to the second part of duodenum.                            The upper GI endoscopy was accomplished without                            difficulty. The patient tolerated the procedure                            well. Scope In: Scope Out: Findings:      Severe esophagitis was found. Ulcerations in distal and mid esophagus.       No active bleeding in esophagus. No varices.      Clotted blood was found in the gastric fundus. Could not see under it.      One spurting cratered small deep gastric ulcer was found in the gastric       body. Area was, injected with 1.59ml of a 1:10,000 solution of       epinephrine for hemostasis. For hemostasis, two hemostatic clips were       successfully placed. There was no bleeding at the end of the procedure.      Two cratered duodenal ulcers were found in the duodenal bulb. The       largest lesion was 10 mm in largest dimension. Not bleeding. Impression:               - Severe esophagitis.                           - Clotted blood in the gastric fundus.                           -  Spurting gastric ulcer. Clips were placed.                           - No specimens collected.                           - Multiple non-bleeding duodenal ulcers. Moderate Sedation:      . Recommendation:           - NPO today.                           - Continue present medications.                           - Watch for further bleeding. PPI therapy. Procedure Code(s):        --- Professional ---                           [phone], Esophagogastroduodenoscopy, flexible,                            transoral; with control of bleeding, any method Diagnosis Code(s):        --- Professional ---                           K20.9, Esophagitis, unspecified                            K92.2, Gastrointestinal hemorrhage, unspecified                           K25.4, Chronic or unspecified gastric ulcer with                            hemorrhage                           K26.9, Duodenal ulcer, unspecified as acute or                            chronic, without hemorrhage or perforation                           K92.0, Hematemesis CPT copyright 2016 [employer] Association. All rights reserved. The codes documented in this report are preliminary and upon coder review may  be revised to meet current compliance requirements. Wonda Horner, MD 08/02/2017 9:57:01 PM This report has been signed electronically. Number of Addenda: 0
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
## 2017-08-02 NOTE — Progress Notes (Signed)
PHARMACY - PHYSICIAN COMMUNICATION CRITICAL VALUE ALERT - BLOOD CULTURE IDENTIFICATION (BCID)  Tracey Hall is an 37 y.o. female who presented to [facility] on 07/31/2017 with a chief complaint of sepsis  Assessment:  Sepsis probably secondary to UTI (include suspected source if known)  Name of physician (or Provider) Contacted: Jeannette Corpus, NP  Current antibiotics: Rocephin 1 Gm daily  Changes to prescribed antibiotics recommended:  Patient is on recommended abx, but increased dose to 2 Gm daily.   Results for orders placed or performed during the hospital encounter of 07/31/17  Blood Culture ID Panel (Reflexed) (Collected: 07/31/2017  7:57 AM)  Result Value Ref Range   Enterococcus species NOT DETECTED NOT DETECTED   Vancomycin resistance NOT DETECTED NOT DETECTED   Listeria monocytogenes NOT DETECTED NOT DETECTED   Staphylococcus species NOT DETECTED NOT DETECTED   Staphylococcus aureus NOT DETECTED NOT DETECTED   Methicillin resistance NOT DETECTED NOT DETECTED   Streptococcus species NOT DETECTED NOT DETECTED   Streptococcus agalactiae NOT DETECTED NOT DETECTED   Streptococcus pneumoniae NOT DETECTED NOT DETECTED   Streptococcus pyogenes NOT DETECTED NOT DETECTED   Acinetobacter baumannii NOT DETECTED NOT DETECTED   Enterobacteriaceae species DETECTED (A) NOT DETECTED   Enterobacter cloacae complex NOT DETECTED NOT DETECTED   Escherichia coli DETECTED (A) NOT DETECTED   Klebsiella oxytoca NOT DETECTED NOT DETECTED   Klebsiella pneumoniae NOT DETECTED NOT DETECTED   Proteus species NOT DETECTED NOT DETECTED   Serratia marcescens NOT DETECTED NOT DETECTED   Carbapenem resistance NOT DETECTED NOT DETECTED   Haemophilus influenzae NOT DETECTED NOT DETECTED   Neisseria meningitidis NOT DETECTED NOT DETECTED   Pseudomonas aeruginosa NOT DETECTED NOT DETECTED   Candida albicans NOT DETECTED NOT DETECTED   Candida glabrata NOT DETECTED NOT DETECTED   Candida krusei NOT DETECTED NOT  DETECTED   Candida parapsilosis NOT DETECTED NOT DETECTED   Candida tropicalis NOT DETECTED NOT DETECTED    [name] 08/02/2017  3:49 AM

## 2017-08-02 NOTE — Progress Notes (Signed)
eLink Physician-Brief Progress Note Patient Name: Tracey Hall DOB: 10-Mar-1981 MRN: [id number]   Date of Service  08/02/2017  HPI/Events of Note  Severe agitation/Delirium - Currently on Versed IV infusion at set dose = 0.5 mg/hour and Morphine IV PRN.   eICU Interventions  Will order: 1. Change Versed IV infusion to 0.5 to 10 mg/hour. Titrate to RASS = 0 to -1. 2. D/C Morphine. 3. Fentanyl 25-100 mcg IV Q 2 hours PRN pain, agitation or sedation.  4. Versed 1 mg IV Q 1 hour PRN agitation or sedation.      Intervention Category Major Interventions: Delirium, psychosis, severe agitation - evaluation and management  Harsh Trulock Eugene 08/02/2017, 7:53 PM

## 2017-08-02 NOTE — Progress Notes (Signed)
50 mcg of fentanyl IV wasted in sharps. Witnessed by Chestine Spore RN.

## 2017-08-02 NOTE — Progress Notes (Signed)
The patient  Developed overt lower gi bleeding at about 4:30 PM. She became a bit more encephalopathic. She remained tachypneic. At about 5PM she dropped her Bp precipitously. Repeat Hb came back ion the 4-5 range.   4 units of PRBCs were ordered. The patient is being vigorously fluid resuscitated. She has been started on Levphed currently at about 8 mcg. The patient was intubated by me using the glide scope. I asked the Hospitalist to speak with the gi cionsultant. I have told her immediated family that she is critically ill and in profound shock.

## 2017-08-02 NOTE — Progress Notes (Signed)
MD notified of abdominal distention and ordered KUB. Patient was made aware of the plans and said she "would think about it." However, when the xray tech came by to perform KUB, patient refused. Patient was educated but still refused. Per patient, "I'm okay, I'm not doing it, I already had one when I came in, thank you."  MD notified and will come by to speak with patient later on today.   Will continue to monitor.

## 2017-08-03 ENCOUNTER — Encounter (HOSPITAL_COMMUNITY): Payer: Self-pay | Admitting: Gastroenterology

## 2017-08-03 ENCOUNTER — Inpatient Hospital Stay (HOSPITAL_COMMUNITY): Payer: Medicare Other

## 2017-08-03 DIAGNOSIS — K250 Acute gastric ulcer with hemorrhage: Secondary | ICD-10-CM

## 2017-08-03 DIAGNOSIS — N10 Acute tubulo-interstitial nephritis: Secondary | ICD-10-CM

## 2017-08-03 DIAGNOSIS — S14109S Unspecified injury at unspecified level of cervical spinal cord, sequela: Secondary | ICD-10-CM

## 2017-08-03 DIAGNOSIS — R509 Fever, unspecified: Secondary | ICD-10-CM

## 2017-08-03 DIAGNOSIS — G8250 Quadriplegia, unspecified: Secondary | ICD-10-CM

## 2017-08-03 DIAGNOSIS — Z9889 Other specified postprocedural states: Secondary | ICD-10-CM

## 2017-08-03 DIAGNOSIS — K279 Peptic ulcer, site unspecified, unspecified as acute or chronic, without hemorrhage or perforation: Secondary | ICD-10-CM | POA: Diagnosis present

## 2017-08-03 DIAGNOSIS — R7881 Bacteremia: Secondary | ICD-10-CM | POA: Diagnosis present

## 2017-08-03 DIAGNOSIS — K209 Esophagitis, unspecified: Secondary | ICD-10-CM

## 2017-08-03 DIAGNOSIS — K274 Chronic or unspecified peptic ulcer, site unspecified, with hemorrhage: Secondary | ICD-10-CM

## 2017-08-03 DIAGNOSIS — W3400XS Accidental discharge from unspecified firearms or gun, sequela: Secondary | ICD-10-CM

## 2017-08-03 DIAGNOSIS — K263 Acute duodenal ulcer without hemorrhage or perforation: Secondary | ICD-10-CM

## 2017-08-03 DIAGNOSIS — R21 Rash and other nonspecific skin eruption: Secondary | ICD-10-CM

## 2017-08-03 DIAGNOSIS — B9620 Unspecified Escherichia coli [E. coli] as the cause of diseases classified elsewhere: Secondary | ICD-10-CM | POA: Diagnosis present

## 2017-08-03 DIAGNOSIS — D62 Acute posthemorrhagic anemia: Secondary | ICD-10-CM

## 2017-08-03 DIAGNOSIS — Z87440 Personal history of urinary (tract) infections: Secondary | ICD-10-CM

## 2017-08-03 DIAGNOSIS — Z9911 Dependence on respirator [ventilator] status: Secondary | ICD-10-CM

## 2017-08-03 LAB — URINE CULTURE

## 2017-08-03 LAB — CBC WITH DIFFERENTIAL/PLATELET
BASOS ABS: 0 10*3/uL (ref 0.0–0.1)
Basophils Relative: 0 %
EOS PCT: 1 %
Eosinophils Absolute: 0.1 10*3/uL (ref 0.0–0.7)
HCT: 27.1 % — ABNORMAL LOW (ref 36.0–46.0)
Hemoglobin: 9.5 g/dL — ABNORMAL LOW (ref 12.0–15.0)
LYMPHS PCT: 19 %
Lymphs Abs: 2.2 10*3/uL (ref 0.7–4.0)
MCH: 26.8 pg (ref 26.0–34.0)
MCHC: 35.1 g/dL (ref 30.0–36.0)
MCV: 76.3 fL — AB (ref 78.0–100.0)
Monocytes Absolute: 0.5 10*3/uL (ref 0.1–1.0)
Monocytes Relative: 4 %
NEUTROS ABS: 8.8 10*3/uL — AB (ref 1.7–7.7)
Neutrophils Relative %: 76 %
Platelets: 221 10*3/uL (ref 150–400)
RBC: 3.55 MIL/uL — AB (ref 3.87–5.11)
RDW: 20.3 % — ABNORMAL HIGH (ref 11.5–15.5)
WBC: 11.5 10*3/uL — AB (ref 4.0–10.5)

## 2017-08-03 LAB — COMPREHENSIVE METABOLIC PANEL
ALT: 23 U/L (ref 14–54)
ANION GAP: 9 (ref 5–15)
AST: 21 U/L (ref 15–41)
Albumin: 2.1 g/dL — ABNORMAL LOW (ref 3.5–5.0)
Alkaline Phosphatase: 67 U/L (ref 38–126)
BILIRUBIN TOTAL: 0.3 mg/dL (ref 0.3–1.2)
BUN: 18 mg/dL (ref 6–20)
CHLORIDE: 110 mmol/L (ref 101–111)
CO2: 17 mmol/L — ABNORMAL LOW (ref 22–32)
Calcium: 7.1 mg/dL — ABNORMAL LOW (ref 8.9–10.3)
Creatinine, Ser: 0.75 mg/dL (ref 0.44–1.00)
Glucose, Bld: 142 mg/dL — ABNORMAL HIGH (ref 65–99)
POTASSIUM: 4.3 mmol/L (ref 3.5–5.1)
Sodium: 136 mmol/L (ref 135–145)
TOTAL PROTEIN: 5.5 g/dL — AB (ref 6.5–8.1)

## 2017-08-03 LAB — TYPE AND SCREEN
ABO/RH(D): O POS
Antibody Screen: NEGATIVE
UNIT DIVISION: 0
UNIT DIVISION: 0
UNIT DIVISION: 0
UNIT DIVISION: 0
UNIT DIVISION: 0
Unit division: 0
Unit division: 0

## 2017-08-03 LAB — BPAM RBC
BLOOD PRODUCT EXPIRATION DATE: 201903062359
BLOOD PRODUCT EXPIRATION DATE: 201903122359
BLOOD PRODUCT EXPIRATION DATE: 201903132359
Blood Product Expiration Date: 201902202359
Blood Product Expiration Date: 201903062359
Blood Product Expiration Date: 201903122359
Blood Product Expiration Date: 201903122359
ISSUE DATE / TIME: 201902101029
ISSUE DATE / TIME: 201902101315
ISSUE DATE / TIME: 201902101626
ISSUE DATE / TIME: 201902111723
ISSUE DATE / TIME: 201902111755
ISSUE DATE / TIME: 201902111836
ISSUE DATE / TIME: 201902111932
UNIT TYPE AND RH: 5100
UNIT TYPE AND RH: 5100
UNIT TYPE AND RH: 5100
Unit Type and Rh: 5100
Unit Type and Rh: 5100
Unit Type and Rh: 5100
Unit Type and Rh: 5100

## 2017-08-03 LAB — CBC
HCT: 26.1 % — ABNORMAL LOW (ref 36.0–46.0)
HEMATOCRIT: 26.6 % — AB (ref 36.0–46.0)
Hemoglobin: 9.4 g/dL — ABNORMAL LOW (ref 12.0–15.0)
Hemoglobin: 9.3 g/dL — ABNORMAL LOW (ref 12.0–15.0)
MCH: 26.6 pg (ref 26.0–34.0)
MCH: 26.7 pg (ref 26.0–34.0)
MCHC: 35.3 g/dL (ref 30.0–36.0)
MCHC: 35.6 g/dL (ref 30.0–36.0)
MCV: 75.4 fL — ABNORMAL LOW (ref 78.0–100.0)
MCV: 75 fL — AB (ref 78.0–100.0)
PLATELETS: 226 10*3/uL (ref 150–400)
PLATELETS: 223 10*3/uL (ref 150–400)
RBC: 3.53 MIL/uL — ABNORMAL LOW (ref 3.87–5.11)
RBC: 3.48 MIL/uL — ABNORMAL LOW (ref 3.87–5.11)
RDW: 20.6 % — AB (ref 11.5–15.5)
RDW: 20.8 % — AB (ref 11.5–15.5)
WBC: 10.4 10*3/uL (ref 4.0–10.5)
WBC: 9.8 10*3/uL (ref 4.0–10.5)

## 2017-08-03 LAB — CK TOTAL AND CKMB (NOT AT ARMC)
CK TOTAL: 333 U/L — AB (ref 38–234)
CK, MB: 3.8 ng/mL (ref 0.5–5.0)
Relative Index: 1.1 (ref 0.0–2.5)

## 2017-08-03 LAB — PROTIME-INR
INR: 1.22
Prothrombin Time: 15.3 seconds — ABNORMAL HIGH (ref 11.4–15.2)

## 2017-08-03 LAB — BLOOD GAS, ARTERIAL
ACID-BASE DEFICIT: 6.1 mmol/L — AB (ref 0.0–2.0)
Bicarbonate: 18.6 mmol/L — ABNORMAL LOW (ref 20.0–28.0)
DRAWN BY: 112491
FIO2: 70
LHR: 18 {breaths}/min
O2 SAT: 99.4 %
PEEP/CPAP: 5 cmH2O
PH ART: 7.336 — AB (ref 7.350–7.450)
Patient temperature: 98.6
VT: 450 mL
pCO2 arterial: 35.9 mmHg (ref 32.0–48.0)
pO2, Arterial: 252 mmHg — ABNORMAL HIGH (ref 83.0–108.0)

## 2017-08-03 LAB — PROCALCITONIN: Procalcitonin: >150 ng/mL

## 2017-08-03 LAB — MAGNESIUM
MAGNESIUM: 1.6 mg/dL — AB (ref 1.7–2.4)
Magnesium: 1.6 mg/dL — ABNORMAL LOW (ref 1.7–2.4)

## 2017-08-03 LAB — RENAL FUNCTION PANEL
ALBUMIN: 2.2 g/dL — AB (ref 3.5–5.0)
Anion gap: 9 (ref 5–15)
BUN: 18 mg/dL (ref 6–20)
CALCIUM: 7.2 mg/dL — AB (ref 8.9–10.3)
CO2: 18 mmol/L — ABNORMAL LOW (ref 22–32)
CREATININE: 0.65 mg/dL (ref 0.44–1.00)
Chloride: 110 mmol/L (ref 101–111)
GFR calc Af Amer: >60 mL/min (ref >60)
GLUCOSE: 161 mg/dL — AB (ref 65–99)
PHOSPHORUS: 1.1 mg/dL — AB (ref 2.5–4.6)
POTASSIUM: 4.5 mmol/L (ref 3.5–5.1)
SODIUM: 137 mmol/L (ref 135–145)

## 2017-08-03 LAB — TRIGLYCERIDES: TRIGLYCERIDES: 86 mg/dL (ref <150)

## 2017-08-03 LAB — APTT: APTT: 39 s — AB (ref 24–36)

## 2017-08-03 LAB — PHOSPHORUS

## 2017-08-03 LAB — CORTISOL-AM, BLOOD: CORTISOL - AM: 15.6 ug/dL (ref 6.7–22.6)

## 2017-08-03 MED ORDER — PROPOFOL 1000 MG/100ML IV EMUL
5.0000 ug/kg/min | INTRAVENOUS | Status: DC
  Administered 2017-08-03: 20 ug/kg/min via INTRAVENOUS
  Administered 2017-08-03: 5 ug/kg/min via INTRAVENOUS
  Administered 2017-08-04: 15 ug/kg/min via INTRAVENOUS
  Filled 2017-08-03 (×3): qty 100

## 2017-08-03 MED ORDER — SODIUM GLYCEROPHOSPHATE 1 MMOLE/ML IV SOLN: 20.0000 mmol | Freq: Once | INTRAVENOUS | Status: DC

## 2017-08-03 MED ORDER — SODIUM CHLORIDE 0.9 % IV SOLN
2.0000 g | Freq: Once | INTRAVENOUS | Status: AC
  Administered 2017-08-03: 2 g via INTRAVENOUS
  Filled 2017-08-03: qty 20

## 2017-08-03 MED ORDER — SODIUM GLYCEROPHOSPHATE 1 MMOLE/ML IV SOLN
30.0000 mmol | Freq: Once | INTRAVENOUS | Status: AC
  Administered 2017-08-03: 30 mmol via INTRAVENOUS
  Filled 2017-08-03: qty 30

## 2017-08-03 MED ORDER — ACETAMINOPHEN 650 MG RE SUPP
650.0000 mg | Freq: Four times a day (QID) | RECTAL | Status: DC | PRN
  Filled 2017-08-03: qty 1

## 2017-08-03 MED ORDER — MAGNESIUM SULFATE 2 GM/50ML IV SOLN
2.0000 g | Freq: Once | INTRAVENOUS | Status: AC
  Administered 2017-08-03: 2 g via INTRAVENOUS
  Filled 2017-08-03: qty 50

## 2017-08-03 MED ORDER — LIP MEDEX EX OINT
TOPICAL_OINTMENT | CUTANEOUS | Status: DC | PRN
  Filled 2017-08-03: qty 7
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
## 2017-08-03 NOTE — Progress Notes (Signed)
[facility] Progress Note Patient Name: ALONZO LOVING DOB: Apr 30, 1981 MRN: [id number]   Date of Service  08/03/2017  HPI/Events of Note  Ca++ = 6.4.  eICU Interventions  Will replace Ca++.     Intervention Category Major Interventions: Electrolyte abnormality - evaluation and management  Rachella Basden Eugene 08/03/2017, 12:45 AM

## 2017-08-03 NOTE — Care Management Note (Signed)
Case Management Note  Patient Details  Name: Tracey Hall MRN: [id number] Date of Birth: January 18, 1981  Subjective/Objective:                  Resp, distress, ileus,sepsis intubated  Action/Plan: Date: August 03, 2017 Velva Harman, BSN, [place], [place] Chart and notes review for patient progress and needs. Will follow for case management and discharge needs. No cm or discharge needs present at time of this review. Next review date: [id number]  Expected Discharge Date:                  Expected Discharge Plan:  Home/Self Care  In-House Referral:     Discharge planning Services  CM Consult  Post Acute Care Choice:    Choice offered to:     DME Arranged:    DME Agency:     HH Arranged:    HH Agency:     Status of Service:  In process, will continue to follow  If discussed at Long Length of Stay Meetings, dates discussed:    Additional Comments:  Leeroy Cha, RN 08/03/2017, 8:49 AM

## 2017-08-03 NOTE — Progress Notes (Signed)
*  LATE ENTRY*   CRITICAL VALUE ALERT  Critical Value: Hemoglobin 4.5  Date & Time Notied: 08/02/2017 1644H  Provider Notified: attending MD, Dr. Sheran Fava.   Orders Received/Actions taken: Repeat CBC; Transfuse 4 units blood stat.
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
## 2017-08-03 NOTE — Progress Notes (Signed)
CRITICAL VALUE ALERT  Critical Value:  Phosphorus <1.0  Date & Time Notied:  08/03/2017 923am  Provider Notified: CC team

## 2017-08-03 NOTE — Progress Notes (Signed)
LATE ENTRY**    CRITICAL VALUE ALERT  Critical Value:  4.5 hemoglobin  Date & Time Notied: 08/03/17 1644  Provider Notified: attending MD, Dr. Sheran Fava.  Orders Received/Actions taken: repeat CBC; transfuse 4 units of blood stat.

## 2017-08-03 NOTE — Progress Notes (Signed)
[facility] Gastroenterology Progress Note  Tracey Hall 37 y.o. 1980-12-19  CC:  GI bleed   Subjective: Patient remains intubated. Agitated. Discussed with nursing staff. No further evidence of active bleeding. Underwent EGD yesterday which showed multiple ulcers with one ulcer in the body with active bleeding,  it was treated with epinephrine injection and 2 clips placement.  ROS : Not able to obtain.   Objective: Vital signs in last 24 hours: Vitals:   08/03/17 0500 08/03/17 0600  BP: 140/71 138/63  Pulse: (!) 122 (!) 125  Resp:    Temp:    SpO2: 99% 100%    Physical Exam:  Gen. Intubated. Agitated. Respiratory. - Coarse breath sounds. Abdomen. Soft, nontender, nondistended, bowel sounds present.  Lab Results: Recent Labs    08/02/17 0338  08/02/17 2219 08/03/17 0500  NA 136   < > 139 137  K 4.5   < > 4.4 4.5  CL 110   < > 111 110  CO2 17*   < > 18* 18*  GLUCOSE 114*   < > 134* 161*  BUN 11   < > 23* 18  CREATININE 0.64   < > 0.61 0.65  CALCIUM 7.5*   < > 6.4* 7.2*  MG 2.2  --   --  1.6*  PHOS 1.1*  --   --  1.1*   < > = values in this interval not displayed.   Recent Labs    08/01/17 0500 08/02/17 0338 08/03/17 0500  AST 69*  --   --   ALT 28  --   --   ALKPHOS 64  --   --   BILITOT 0.4  --   --   PROT 5.5*  --   --   ALBUMIN 2.3* 2.6* 2.2*   Recent Labs    08/01/17 2219  08/02/17 2347 08/03/17 0500  WBC 10.8*   < > 11.5* 10.4  NEUTROABS 8.3*  --  8.8*  --   HGB 8.2*   < > 9.5* 9.4*  HCT 24.9*   < > 27.1* 26.6*  MCV 68.8*   < > 76.3* 75.4*  PLT 318   < > 221 226   < > = values in this interval not displayed.   Recent Labs    08/02/17 1931 08/03/17 0500  LABPROT 15.8* 15.3*  INR 1.27 1.22      Assessment/Plan: - Active GI bleed from bleeding gastric body ulcer. Status post EGD 02/11 with  epinephrine injection and 2 clips placement. No further evidence of active bleeding. Hemoglobin has been stabilized. BUN trending down. - Sepsis  with Escherichia coli bacteremia and pyelonephritis - Respiratory failure. Currently intubated  Recommendations ----------------------- - Continue Protonix drip for now. Monitor H&H. Transfuse to keep hemoglobin around 8. - GI will follow.   Otis Brace MD, [place] 08/03/2017, 8:04 AM  Contact #  [phone]
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
## 2017-08-03 NOTE — Progress Notes (Signed)
Patient wean held at this time. Patient has high grade, tachycardic, and tachypneic. RT will continue to monitor patient.

## 2017-08-04 ENCOUNTER — Inpatient Hospital Stay (HOSPITAL_COMMUNITY): Payer: Medicare Other

## 2017-08-04 DIAGNOSIS — Z789 Other specified health status: Secondary | ICD-10-CM

## 2017-08-04 DIAGNOSIS — N179 Acute kidney failure, unspecified: Secondary | ICD-10-CM

## 2017-08-04 DIAGNOSIS — K274 Chronic or unspecified peptic ulcer, site unspecified, with hemorrhage: Secondary | ICD-10-CM

## 2017-08-04 DIAGNOSIS — K9420 Gastrostomy complication, unspecified: Secondary | ICD-10-CM

## 2017-08-04 DIAGNOSIS — K9423 Gastrostomy malfunction: Secondary | ICD-10-CM

## 2017-08-04 DIAGNOSIS — N319 Neuromuscular dysfunction of bladder, unspecified: Secondary | ICD-10-CM

## 2017-08-04 DIAGNOSIS — A4151 Sepsis due to Escherichia coli [E. coli]: Principal | ICD-10-CM

## 2017-08-04 DIAGNOSIS — N3000 Acute cystitis without hematuria: Secondary | ICD-10-CM

## 2017-08-04 DIAGNOSIS — L139 Bullous disorder, unspecified: Secondary | ICD-10-CM

## 2017-08-04 DIAGNOSIS — D509 Iron deficiency anemia, unspecified: Secondary | ICD-10-CM

## 2017-08-04 DIAGNOSIS — F411 Generalized anxiety disorder: Secondary | ICD-10-CM

## 2017-08-04 DIAGNOSIS — A419 Sepsis, unspecified organism: Secondary | ICD-10-CM

## 2017-08-04 DIAGNOSIS — R0682 Tachypnea, not elsewhere classified: Secondary | ICD-10-CM

## 2017-08-04 DIAGNOSIS — R7881 Bacteremia: Secondary | ICD-10-CM

## 2017-08-04 LAB — CBC
HEMATOCRIT: 22.1 % — AB (ref 36.0–46.0)
Hemoglobin: 7.7 g/dL — ABNORMAL LOW (ref 12.0–15.0)
MCH: 26.5 pg (ref 26.0–34.0)
MCHC: 34.8 g/dL (ref 30.0–36.0)
MCV: 75.9 fL — AB (ref 78.0–100.0)
Platelets: 194 10*3/uL (ref 150–400)
RBC: 2.91 MIL/uL — AB (ref 3.87–5.11)
RDW: 21.8 % — ABNORMAL HIGH (ref 11.5–15.5)
WBC: 9.4 10*3/uL (ref 4.0–10.5)

## 2017-08-04 LAB — BLOOD GAS, ARTERIAL
Acid-base deficit: 2.9 mmol/L — ABNORMAL HIGH (ref 0.0–2.0)
Bicarbonate: 23 mmol/L (ref 20.0–28.0)
Drawn by: 331471
FIO2: 40
Mode: POSITIVE
O2 Saturation: 98.1 %
PEEP: 5 cmH2O
Patient temperature: 37
Pressure support: 5 cmH2O
pCO2 arterial: 48.7 mmHg — ABNORMAL HIGH (ref 32.0–48.0)
pH, Arterial: 7.295 — ABNORMAL LOW (ref 7.350–7.450)
pO2, Arterial: 123 mmHg — ABNORMAL HIGH (ref 83.0–108.0)

## 2017-08-04 LAB — CULTURE, BLOOD (ROUTINE X 2): Special Requests: ADEQUATE

## 2017-08-04 LAB — RENAL FUNCTION PANEL
Albumin: 1.9 g/dL — ABNORMAL LOW (ref 3.5–5.0)
Anion gap: 8 (ref 5–15)
BUN: 10 mg/dL (ref 6–20)
CHLORIDE: 110 mmol/L (ref 101–111)
CO2: 20 mmol/L — ABNORMAL LOW (ref 22–32)
Calcium: 7 mg/dL — ABNORMAL LOW (ref 8.9–10.3)
Creatinine, Ser: 0.42 mg/dL — ABNORMAL LOW (ref 0.44–1.00)
Glucose, Bld: 121 mg/dL — ABNORMAL HIGH (ref 65–99)
POTASSIUM: 3.4 mmol/L — AB (ref 3.5–5.1)
Phosphorus: 1.9 mg/dL — ABNORMAL LOW (ref 2.5–4.6)
Sodium: 138 mmol/L (ref 135–145)

## 2017-08-04 LAB — MAGNESIUM: Magnesium: 1.8 mg/dL (ref 1.7–2.4)

## 2017-08-04 MED ORDER — POTASSIUM CHLORIDE 10 MEQ/100ML IV SOLN
10.0000 meq | INTRAVENOUS | Status: AC
  Administered 2017-08-04 (×4): 10 meq via INTRAVENOUS
  Filled 2017-08-04 (×3): qty 100

## 2017-08-04 MED ORDER — SODIUM CHLORIDE 0.9% FLUSH
10.0000 mL | INTRAVENOUS | Status: DC | PRN
  Administered 2017-08-11: 40 mL
  Filled 2017-08-04: qty 40

## 2017-08-04 MED ORDER — CHLORHEXIDINE GLUCONATE CLOTH 2 % EX PADS
6.0000 | MEDICATED_PAD | Freq: Every day | CUTANEOUS | Status: DC
  Administered 2017-08-06 – 2017-08-10 (×5): 6 via TOPICAL

## 2017-08-04 MED ORDER — SODIUM CHLORIDE 0.9% FLUSH
10.0000 mL | Freq: Two times a day (BID) | INTRAVENOUS | Status: DC
  Administered 2017-08-04: 30 mL
  Administered 2017-08-05: 20 mL
  Administered 2017-08-05: 10 mL
  Administered 2017-08-06: 30 mL
  Administered 2017-08-06: 40 mL
  Administered 2017-08-07 – 2017-08-08 (×3): 10 mL
  Administered 2017-08-08: 20 mL
  Administered 2017-08-11: 10 mL

## 2017-08-04 MED ORDER — CHLORHEXIDINE GLUCONATE CLOTH 2 % EX PADS: 6.0000 | MEDICATED_PAD | Freq: Every day | CUTANEOUS | Status: DC

## 2017-08-04 MED ORDER — MAGNESIUM SULFATE 2 GM/50ML IV SOLN
2.0000 g | Freq: Once | INTRAVENOUS | Status: AC
  Administered 2017-08-04: 2 g via INTRAVENOUS
  Filled 2017-08-04: qty 50

## 2017-08-04 NOTE — Progress Notes (Signed)
Patient ID: Tracey Hall, female   DOB: 04-30-1981, 37 y.o.   MRN: [id number]          [facility] for Infectious Disease    Date of Admission:  07/31/2017    Day 5 ceftriaxone         Tracey Hall is now afebrile.  She is currently undergoing a procedure and I was not able to examine her.  Repeat blood cultures are negative so far.  I would continue ceftriaxone for now.         Michel Bickers, MD [facility] for Infectious [place] [facility] [phone] pager   [phone] cell 08/04/2017, 10:08 AM
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
## 2017-08-04 NOTE — Progress Notes (Signed)
Peripherally Inserted Central Catheter/Midline Placement  The IV Nurse has discussed with the patient and/or persons authorized to consent for the patient, the purpose of this procedure and the potential benefits and risks involved with this procedure.  The benefits include less needle sticks, lab draws from the catheter, and the patient may be discharged home with the catheter. Risks include, but not limited to, infection, bleeding, blood clot (thrombus formation), and puncture of an artery; nerve damage and irregular heartbeat and possibility to perform a PICC exchange if needed/ordered by physician.  Alternatives to this procedure were also discussed.  Bard Power PICC patient education guide, fact sheet on infection prevention and patient information card has been provided to patient /or left at bedside.    PICC/Midline Placement Documentation  PICC Triple Lumen 08/04/17 PICC Right Brachial 38 cm 0 cm (Active)  Indication for Insertion or Continuance of Line Vasoactive infusions 08/04/2017 10:00 AM  Site Assessment Clean;Dry;Intact 08/04/2017 10:00 AM  Lumen #1 Status Flushed;Blood return noted 08/04/2017 10:00 AM  Lumen #2 Status Flushed;Blood return noted 08/04/2017 10:00 AM  Lumen #3 Status Flushed;Blood return noted 08/04/2017 10:00 AM  Dressing Type Transparent 08/04/2017 10:00 AM  Dressing Status Clean;Intact;Dry;Antimicrobial disc in place 08/04/2017 10:00 AM  Dressing Change Due 08/11/17 08/04/2017 10:00 AM       Scotty Court 08/04/2017, 10:33 AM

## 2017-08-04 NOTE — Procedures (Signed)
Extubation Procedure Note  Patient Details:   Name: ZANYIAH POSTEN DOB: 1981/04/15 MRN: [id number]   Airway Documentation:     Evaluation  O2 sats: stable throughout Complications: No apparent complications Patient did tolerate procedure well. Bilateral Breath Sounds: Rhonchi   No  Johnette Abraham 08/04/2017, 4:25 PM

## 2017-08-05 ENCOUNTER — Inpatient Hospital Stay (HOSPITAL_COMMUNITY): Payer: Medicare Other

## 2017-08-05 DIAGNOSIS — N390 Urinary tract infection, site not specified: Secondary | ICD-10-CM

## 2017-08-05 DIAGNOSIS — Z91040 Latex allergy status: Secondary | ICD-10-CM

## 2017-08-05 LAB — CULTURE, BLOOD (ROUTINE X 2)
CULTURE: NO GROWTH
Special Requests: ADEQUATE

## 2017-08-05 LAB — CBC
HEMATOCRIT: 22.7 % — AB (ref 36.0–46.0)
HEMOGLOBIN: 7.5 g/dL — AB (ref 12.0–15.0)
MCH: 26 pg (ref 26.0–34.0)
MCHC: 33 g/dL (ref 30.0–36.0)
MCV: 78.8 fL (ref 78.0–100.0)
Platelets: 184 10*3/uL (ref 150–400)
RBC: 2.88 MIL/uL — ABNORMAL LOW (ref 3.87–5.11)
RDW: 21.9 % — ABNORMAL HIGH (ref 11.5–15.5)
WBC: 7.9 10*3/uL (ref 4.0–10.5)

## 2017-08-05 LAB — RENAL FUNCTION PANEL
Albumin: 2 g/dL — ABNORMAL LOW (ref 3.5–5.0)
Anion gap: 8 (ref 5–15)
BUN: 9 mg/dL (ref 6–20)
CALCIUM: 7.4 mg/dL — AB (ref 8.9–10.3)
CO2: 22 mmol/L (ref 22–32)
CREATININE: 0.43 mg/dL — AB (ref 0.44–1.00)
Chloride: 112 mmol/L — ABNORMAL HIGH (ref 101–111)
Glucose, Bld: 94 mg/dL (ref 65–99)
PHOSPHORUS: 2.6 mg/dL (ref 2.5–4.6)
Potassium: 4.1 mmol/L (ref 3.5–5.1)
SODIUM: 142 mmol/L (ref 135–145)

## 2017-08-05 LAB — CORTISOL: Cortisol, Plasma: 12.6 ug/dL

## 2017-08-05 MED ORDER — GUAIFENESIN-DM 100-10 MG/5ML PO SYRP
5.0000 mL | ORAL_SOLUTION | ORAL | Status: DC | PRN
  Administered 2017-08-05 – 2017-08-07 (×4): 5 mL via ORAL
  Filled 2017-08-05 (×4): qty 10

## 2017-08-05 MED ORDER — PANTOPRAZOLE SODIUM 40 MG PO TBEC
40.0000 mg | DELAYED_RELEASE_TABLET | Freq: Two times a day (BID) | ORAL | Status: DC
  Administered 2017-08-05 – 2017-08-11 (×12): 40 mg via ORAL
  Filled 2017-08-05 (×12): qty 1

## 2017-08-05 MED ORDER — PANTOPRAZOLE SODIUM 40 MG IV SOLR
40.0000 mg | Freq: Two times a day (BID) | INTRAVENOUS | Status: DC
  Administered 2017-08-05: 40 mg via INTRAVENOUS
  Filled 2017-08-05: qty 40

## 2017-08-05 MED ORDER — SULFAMETHOXAZOLE-TRIMETHOPRIM 800-160 MG PO TABS
1.0000 | ORAL_TABLET | Freq: Two times a day (BID) | ORAL | Status: DC
  Administered 2017-08-05 – 2017-08-11 (×12): 1 via ORAL
  Filled 2017-08-05 (×13): qty 1

## 2017-08-05 MED ORDER — ALBUTEROL SULFATE (2.5 MG/3ML) 0.083% IN NEBU
2.5000 mg | INHALATION_SOLUTION | RESPIRATORY_TRACT | Status: DC | PRN
  Administered 2017-08-05 – 2017-08-09 (×7): 2.5 mg via RESPIRATORY_TRACT
  Filled 2017-08-05 (×7): qty 3

## 2017-08-05 MED ORDER — HYDROCORTISONE NA SUCCINATE PF 100 MG IJ SOLR
50.0000 mg | Freq: Four times a day (QID) | INTRAMUSCULAR | Status: DC
  Administered 2017-08-05 – 2017-08-10 (×21): 50 mg via INTRAVENOUS
  Filled 2017-08-05 (×21): qty 2

## 2017-08-05 NOTE — Progress Notes (Signed)
Pt and pt family repeatly taking BIPAP mask off. Pt stating to me she's  not happy. RT placed 2 PLM [place] on pt. Family and RN at bedside.
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
## 2017-08-05 NOTE — Progress Notes (Signed)
Pt stable and secure on 2Lpm nasal cannula.  No BiPAP indicated at this time.
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
## 2017-08-05 NOTE — Progress Notes (Signed)
This RT attempted to obtain abg but was unsuccessful.  Dayshift RT is aware of rx and will reattempt abg this morning.

## 2017-08-05 NOTE — Progress Notes (Signed)
Patient was having trouble breathing and refusing to wear her oxygen. Concerned along with Antoine Poche Director RN in regard to patient not wearing oxygen. Patient refused at this time not wanting oxygen or breathing tube, as well as not being cleaned up at this time. Patient stated "maybe this is the age of quads dying". We told family that we could give the patient some time and we would be happy to clean the patient up when she is ready. Notified Brandy NP with CCM in regards to patient's expressing her wishes in not wanting life support. NP Theadora Rama would access the situation.

## 2017-08-05 NOTE — Progress Notes (Signed)
Pt having increased WOB with c/o SOB. Resp rate 30-35. Pt is very congested, but unable to expel secretions. Bilateral lung sounds- Rhonchi. MD notified. Orders given to place Pt on Bipap mask. RT notified. RN will continue to monitor.
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
## 2017-08-05 NOTE — Progress Notes (Signed)
   08/05/17 1100  Clinical Encounter Type  Visited With Patient and family together  Visit Type Initial;Psychological support;Spiritual support;Critical Care  Referral From Nurse  Consult/Referral To Chaplain  Spiritual Encounters  Spiritual Needs Emotional;Other (Comment) (Spiritual Conversation/Support)  Stress Factors  Patient Stress Factors Health changes  Family Stress Factors Health changes   I briefly visited with the patient and her family. I provided a neck pillow for the patient. The patient's family were very anxious and irritable; but did not explain where the anxiety is coming from.   Please, contact Spiritual Care for further assistance.   Chaplain Shanon Ace M.Div., [facility]

## 2017-08-05 NOTE — Progress Notes (Signed)
Patient ID: Tracey Hall, female   DOB: September 24, 1980, 37 y.o.   MRN: [id number]          [facility]  Date of Admission:  07/31/2017           Day 6 ceftriaxone ASSESSMENT: She is improving steadily and is now extubated.  She has defervesced.  I am going to change ceftriaxone to oral trimethoprim sulfamethoxazole and plan on 4 more days of therapy for her E. coli bacteremia and UTI.  PLAN: 1. Change ceftriaxone to trimethoprim sulfamethoxazole and treat for 4 more days through 08/09/2017 2. I will sign off now  Principal Problem:   Fever Active Problems:   Quadriplegia following spinal cord injury ([place])   Sepsis due to urinary tract infection ([place])   Peptic ulcer disease with hemorrhage   E coli bacteremia   Microcytic anemia   Anxiety state   Neurogenic bladder   Recurrent UTI   AKI (acute kidney injury) ([place])   Bullous dermatitis   Complication of feeding tube ([facility])   Intubation of airway performed without difficulty   Tachypnea   Scheduled Meds: . chlorhexidine gluconate (MEDLINE KIT)  15 mL Mouth Rinse BID  . Chlorhexidine Gluconate Cloth  6 each Topical Q0600  . hydrocortisone cream   Topical BID  . hydrocortisone sod succinate (SOLU-CORTEF) inj  50 mg Intravenous Q6H  . lidocaine  1 application Topical Once  . mouth rinse  15 mL Mouth Rinse QID  . pantoprazole  40 mg Oral BID  . sodium chloride flush  10-40 mL Intracatheter Q12H  . sodium chloride flush  10-40 mL Intracatheter Q12H  . sodium chloride flush  3 mL Intravenous Q12H   Continuous Infusions: . cefTRIAXone (ROCEPHIN)  IV Stopped (08/04/17 2356)  . dextrose 5 % and 0.45 % NaCl with KCl 10 mEq/L 75 mL/hr at 08/05/17 0600  . norepinephrine (LEVOPHED) Adult infusion Stopped (08/05/17 0005)   PRN Meds:.acetaminophen, fentaNYL (SUBLIMAZE) injection, lip balm, midazolam, potassium chloride, sodium chloride flush, sodium chloride flush   SUBJECTIVE: She tells me that she wants  to make a complaint about the hospital.  She is very upset about multiple issues.  She is angry at the triage nurse who checked her in in the emergency department.  She is angry because she had to wait in the waiting room for so long before getting a bed.  She is very upset about having an NG tube placed.  Review of Systems: Review of Systems  Unable to perform ROS: Other    Allergies  Allergen Reactions  . Latex Rash    OBJECTIVE: Vitals:   08/05/17 0800 08/05/17 0900 08/05/17 1000 08/05/17 1200  BP: (!) 159/98 (!) 134/53 115/71 119/83  Pulse: 85 88  95  Resp: 20 16  (!) 23  Temp:      TempSrc:      SpO2: 100% 100%  99%  Weight:      Height:       Body mass index is 38.15 kg/m.  Physical Exam  Constitutional: She is oriented to person, place, and time.  She is sitting up in bed.  Several family members are present.  She is very upset and angry.  Neurological: She is alert and oriented to person, place, and time.  Skin: No rash noted.    Lab Results Lab Results  Component Value Date   WBC 7.9 08/05/2017   HGB 7.5 (L) 08/05/2017   HCT 22.7 (L) 08/05/2017  MCV 78.8 08/05/2017   PLT 184 08/05/2017    Lab Results  Component Value Date   CREATININE 0.43 (L) 08/05/2017   BUN 9 08/05/2017   NA 142 08/05/2017   K 4.1 08/05/2017   CL 112 (H) 08/05/2017   CO2 22 08/05/2017    Lab Results  Component Value Date   ALT 23 08/03/2017   AST 21 08/03/2017   ALKPHOS 67 08/03/2017   BILITOT 0.3 08/03/2017     Microbiology: Recent Results (from the past 240 hour(s))  Urine culture     Status: Abnormal   Collection Time: 07/31/17  7:00 AM  Result Value Ref Range Status   Specimen Description   Final    URINE, RANDOM Performed at [facility], [place] [address] [place], [place] [address]    Special Requests   Final    NONE Performed at [facility], [place] [address], [place], [place] [address]    Culture (A)  Final    >=100,000  COLONIES/mL ESCHERICHIA COLI >=100,000 COLONIES/mL AEROCOCCUS URINAE Standardized susceptibility testing for this organism is not available. Performed at [facility], [place] [address], [place], [place] [address]    Report Status 08/03/2017 FINAL  Final   Organism ID, Bacteria ESCHERICHIA COLI (A)  Final      Susceptibility   Escherichia coli - MIC*    AMPICILLIN 4 SENSITIVE Sensitive     CEFAZOLIN <=4 SENSITIVE Sensitive     CEFTRIAXONE <=1 SENSITIVE Sensitive     CIPROFLOXACIN <=0.25 SENSITIVE Sensitive     GENTAMICIN <=1 SENSITIVE Sensitive     IMIPENEM <=0.25 SENSITIVE Sensitive     NITROFURANTOIN 32 SENSITIVE Sensitive     TRIMETH/SULFA <=20 SENSITIVE Sensitive     AMPICILLIN/SULBACTAM <=2 SENSITIVE Sensitive     PIP/TAZO <=4 SENSITIVE Sensitive     Extended ESBL NEGATIVE Sensitive     * >=100,000 COLONIES/mL ESCHERICHIA COLI  Blood culture (routine x 2)     Status: Abnormal   Collection Time: 07/31/17  7:57 AM  Result Value Ref Range Status   Specimen Description   Final    BLOOD RIGHT UPPER ARM Performed at [facility] [address], [place], [place] [address]    Special Requests   Final    BOTTLES DRAWN AEROBIC AND ANAEROBIC Blood Culture adequate volume Performed at [facility] [address] [place], [place] [address]    Culture  Setup Time   Final    ANAEROBIC BOTTLE ONLY GRAM NEGATIVE RODS CRITICAL RESULT CALLED TO, READ BACK BY AND VERIFIED WITH: Novella Rob [id number] 08/02/2017 Mena Goes Performed at [facility], [place] [address] [place], [place] [address]    Culture ESCHERICHIA COLI (A)  Final   Report Status 08/04/2017 FINAL  Final   Organism ID, Bacteria ESCHERICHIA COLI  Final      Susceptibility   Escherichia coli - MIC*    AMPICILLIN 4 SENSITIVE Sensitive     CEFAZOLIN <=4 SENSITIVE Sensitive     CEFEPIME <=1 SENSITIVE Sensitive     CEFTAZIDIME <=1 SENSITIVE Sensitive     CEFTRIAXONE <=1 SENSITIVE  Sensitive     CIPROFLOXACIN <=0.25 SENSITIVE Sensitive     GENTAMICIN <=1 SENSITIVE Sensitive     IMIPENEM <=0.25 SENSITIVE Sensitive     TRIMETH/SULFA <=20 SENSITIVE Sensitive     AMPICILLIN/SULBACTAM <=2 SENSITIVE Sensitive     PIP/TAZO <=4 SENSITIVE Sensitive     Extended ESBL NEGATIVE Sensitive     *  ESCHERICHIA COLI  Blood Culture ID Panel (Reflexed)     Status: Abnormal   Collection Time: 07/31/17  7:57 AM  Result Value Ref Range Status   Enterococcus species NOT DETECTED NOT DETECTED Final   Vancomycin resistance NOT DETECTED NOT DETECTED Final   Listeria monocytogenes NOT DETECTED NOT DETECTED Final   Staphylococcus species NOT DETECTED NOT DETECTED Final   Staphylococcus aureus NOT DETECTED NOT DETECTED Final   Methicillin resistance NOT DETECTED NOT DETECTED Final   Streptococcus species NOT DETECTED NOT DETECTED Final   Streptococcus agalactiae NOT DETECTED NOT DETECTED Final   Streptococcus pneumoniae NOT DETECTED NOT DETECTED Final   Streptococcus pyogenes NOT DETECTED NOT DETECTED Final   Acinetobacter baumannii NOT DETECTED NOT DETECTED Final   Enterobacteriaceae species DETECTED (A) NOT DETECTED Final    Comment: CRITICAL RESULT CALLED TO, READ BACK BY AND VERIFIED WITH: B. GREEN,PHARMD 0324 08/02/2017 T. TYSOR    Enterobacter cloacae complex NOT DETECTED NOT DETECTED Final   Escherichia coli DETECTED (A) NOT DETECTED Final    Comment: CRITICAL RESULT CALLED TO, READ BACK BY AND VERIFIED WITH: B. GREEN,PHARMD 0324 08/02/2017 T. TYSOR    Klebsiella oxytoca NOT DETECTED NOT DETECTED Final   Klebsiella pneumoniae NOT DETECTED NOT DETECTED Final   Proteus species NOT DETECTED NOT DETECTED Final   Serratia marcescens NOT DETECTED NOT DETECTED Final   Carbapenem resistance NOT DETECTED NOT DETECTED Final   Haemophilus influenzae NOT DETECTED NOT DETECTED Final   Neisseria meningitidis NOT DETECTED NOT DETECTED Final   Pseudomonas aeruginosa NOT DETECTED NOT DETECTED  Final   Candida albicans NOT DETECTED NOT DETECTED Final   Candida glabrata NOT DETECTED NOT DETECTED Final   Candida krusei NOT DETECTED NOT DETECTED Final   Candida parapsilosis NOT DETECTED NOT DETECTED Final   Candida tropicalis NOT DETECTED NOT DETECTED Final    Comment: Performed at [facility], [address] [place], [place] [address]  Blood culture (routine x 2)     Status: None   Collection Time: 07/31/17 10:25 AM  Result Value Ref Range Status   Specimen Description   Final    BLOOD LEFT ARM Performed at [facility] [address], [place], [place] [address]    Special Requests   Final    IN PEDIATRIC BOTTLE Blood Culture adequate volume Performed at [facility] [address] [place], [place] [address]    Culture   Final    NO GROWTH 5 DAYS Performed at [facility], [place] [address], [place], [place] [address]    Report Status 08/05/2017 FINAL  Final  MRSA PCR Screening     Status: None   Collection Time: 07/31/17  7:05 PM  Result Value Ref Range Status   MRSA by PCR NEGATIVE NEGATIVE Final    Comment:        The GeneXpert MRSA Assay (FDA approved for NASAL specimens only), is one component of a comprehensive MRSA colonization surveillance program. It is not intended to diagnose MRSA infection nor to guide or monitor treatment for MRSA infections. Performed at [facility], [place] [address], [place], [place] [address]   Culture, blood (routine x 2)     Status: None (Preliminary result)   Collection Time: 08/03/17  5:38 PM  Result Value Ref Range Status   Specimen Description   Final    BLOOD RIGHT HAND Performed at [facility] [address], [place], [place] [address]  Special Requests   Final    IN PEDIATRIC BOTTLE Blood Culture adequate volume Performed at [facility] [address] [place], [place] [address]    Culture   Final    NO  GROWTH 2 DAYS Performed at [facility] [address] [place] [address], [place], [place] [address]    Report Status PENDING  Incomplete  Culture, blood (routine x 2)     Status: None (Preliminary result)   Collection Time: 08/03/17  5:43 PM  Result Value Ref Range Status   Specimen Description   Final    BLOOD RIGHT HAND Performed at [facility] [address] [place], [place] [address]    Special Requests   Final    IN PEDIATRIC BOTTLE Blood Culture adequate volume Performed at [facility] [address], [place], [place] [address]    Culture   Final    NO GROWTH 2 DAYS Performed at [facility] [address], [place], [place] [address]    Report Status PENDING  Incomplete    Michel Bickers, MD [facility] for [facility] [phone] pager   [phone] cell 08/05/2017, 5:04 PM

## 2017-08-05 NOTE — Progress Notes (Signed)
RT attempted to obtain ordered ABG- times two- unsuccessful (3rd shift RT attempted as well). RN and ICU RT aware. MD may need to obtain femoral sample.

## 2017-08-06 LAB — RENAL FUNCTION PANEL
ALBUMIN: 2.3 g/dL — AB (ref 3.5–5.0)
Anion gap: 8 (ref 5–15)
CO2: 23 mmol/L (ref 22–32)
CREATININE: 0.4 mg/dL — AB (ref 0.44–1.00)
Calcium: 7.7 mg/dL — ABNORMAL LOW (ref 8.9–10.3)
Chloride: 109 mmol/L (ref 101–111)
GFR calc Af Amer: >60 mL/min (ref >60)
Glucose, Bld: 126 mg/dL — ABNORMAL HIGH (ref 65–99)
PHOSPHORUS: 3.2 mg/dL (ref 2.5–4.6)
POTASSIUM: 3.8 mmol/L (ref 3.5–5.1)
SODIUM: 140 mmol/L (ref 135–145)

## 2017-08-06 LAB — CBC
HCT: 24.4 % — ABNORMAL LOW (ref 36.0–46.0)
Hemoglobin: 8.3 g/dL — ABNORMAL LOW (ref 12.0–15.0)
MCH: 26.6 pg (ref 26.0–34.0)
MCHC: 34 g/dL (ref 30.0–36.0)
MCV: 78.2 fL (ref 78.0–100.0)
Platelets: 265 10*3/uL (ref 150–400)
RBC: 3.12 MIL/uL — ABNORMAL LOW (ref 3.87–5.11)
RDW: 22.3 % — AB (ref 11.5–15.5)
WBC: 6.6 10*3/uL (ref 4.0–10.5)

## 2017-08-06 MED ORDER — METOPROLOL TARTRATE 5 MG/5ML IV SOLN
5.0000 mg | Freq: Four times a day (QID) | INTRAVENOUS | Status: DC | PRN
  Administered 2017-08-07 – 2017-08-08 (×2): 5 mg via INTRAVENOUS
  Filled 2017-08-06 (×3): qty 5

## 2017-08-06 MED ORDER — ACETAMINOPHEN 325 MG PO TABS
650.0000 mg | ORAL_TABLET | Freq: Four times a day (QID) | ORAL | Status: DC | PRN
  Administered 2017-08-06 – 2017-08-11 (×2): 650 mg via ORAL
  Filled 2017-08-06 (×2): qty 2

## 2017-08-06 NOTE — Progress Notes (Signed)
Pt. was having some difficulty breathing last night. O2 was greater than 95% all night with the help of deep breathing techniques. Tried a breathing treatment, did not seem to help much. Pt. was briefly placed on bipap, and nasal cannula. However, patient took them off and refused to continue wearing Bipap or [place]. Stated: "do not like to wear things on my face". Patient is very exhausted at this moment but still refuses to wear bipap.

## 2017-08-06 NOTE — Progress Notes (Signed)
Date: August 06, 2017 Velva Harman, BSN, [facility], [place] Chart and notes review for patient progress and needs. On bipap overnight [place] o2 at 2l during day/ Will follow for case management and discharge needs. No cm or discharge needs present at time of this review. Next review date: [id number]

## 2017-08-06 NOTE — Progress Notes (Signed)
Pt not on BIPAP at this time.  
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
## 2017-08-07 LAB — CBC
HCT: 23.2 % — ABNORMAL LOW (ref 36.0–46.0)
Hemoglobin: 7.6 g/dL — ABNORMAL LOW (ref 12.0–15.0)
MCH: 25.7 pg — AB (ref 26.0–34.0)
MCHC: 32.8 g/dL (ref 30.0–36.0)
MCV: 78.4 fL (ref 78.0–100.0)
PLATELETS: 313 10*3/uL (ref 150–400)
RBC: 2.96 MIL/uL — AB (ref 3.87–5.11)
RDW: 22.4 % — ABNORMAL HIGH (ref 11.5–15.5)
WBC: 7.9 10*3/uL (ref 4.0–10.5)

## 2017-08-07 LAB — RENAL FUNCTION PANEL
ALBUMIN: 2.2 g/dL — AB (ref 3.5–5.0)
Anion gap: 8 (ref 5–15)
BUN: <5 mg/dL — ABNORMAL LOW (ref 6–20)
CO2: 27 mmol/L (ref 22–32)
CREATININE: 0.49 mg/dL (ref 0.44–1.00)
Calcium: 8 mg/dL — ABNORMAL LOW (ref 8.9–10.3)
Chloride: 106 mmol/L (ref 101–111)
GFR calc non Af Amer: >60 mL/min (ref >60)
GLUCOSE: 140 mg/dL — AB (ref 65–99)
PHOSPHORUS: 2.8 mg/dL (ref 2.5–4.6)
Potassium: 3.9 mmol/L (ref 3.5–5.1)
SODIUM: 141 mmol/L (ref 135–145)

## 2017-08-07 LAB — PREPARE RBC (CROSSMATCH)

## 2017-08-07 MED ORDER — METOPROLOL TARTRATE 25 MG PO TABS
12.5000 mg | ORAL_TABLET | Freq: Two times a day (BID) | ORAL | Status: DC
  Administered 2017-08-07 – 2017-08-11 (×9): 12.5 mg via ORAL
  Filled 2017-08-07 (×11): qty 1

## 2017-08-07 MED ORDER — SODIUM CHLORIDE 0.9 % IV SOLN
Freq: Once | INTRAVENOUS | Status: AC
Start: 2017-08-07 — End: 2017-08-07
  Administered 2017-08-07: 16:00:00 via INTRAVENOUS

## 2017-08-07 MED ORDER — GABAPENTIN 300 MG PO CAPS
300.0000 mg | ORAL_CAPSULE | Freq: Two times a day (BID) | ORAL | Status: DC
  Administered 2017-08-07 – 2017-08-11 (×9): 300 mg via ORAL
  Filled 2017-08-07 (×9): qty 1

## 2017-08-07 MED ORDER — CLOBETASOL PROPIONATE 0.05 % EX OINT
1.0000 "application " | TOPICAL_OINTMENT | Freq: Two times a day (BID) | CUTANEOUS | Status: DC
  Administered 2017-08-07 – 2017-08-10 (×8): 1 via TOPICAL
  Filled 2017-08-07 (×2): qty 15

## 2017-08-07 MED ORDER — SODIUM CHLORIDE 0.9 % IV SOLN
510.0000 mg | Freq: Once | INTRAVENOUS | Status: AC
  Administered 2017-08-07: 510 mg via INTRAVENOUS
  Filled 2017-08-07: qty 17

## 2017-08-07 MED ORDER — ALTEPLASE 2 MG IJ SOLR
2.0000 mg | Freq: Once | INTRAMUSCULAR | Status: AC
  Administered 2017-08-07: 2 mg

## 2017-08-07 MED ORDER — CHLORHEXIDINE GLUCONATE 4 % EX LIQD
Freq: Every day | CUTANEOUS | Status: DC
  Administered 2017-08-07 – 2017-08-10 (×4): via TOPICAL
  Filled 2017-08-07 (×2): qty 15
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
## 2017-08-08 LAB — CULTURE, BLOOD (ROUTINE X 2)
CULTURE: NO GROWTH
Culture: NO GROWTH
Special Requests: ADEQUATE
Special Requests: ADEQUATE

## 2017-08-08 LAB — TYPE AND SCREEN
ABO/RH(D): O POS
ANTIBODY SCREEN: NEGATIVE
Unit division: 0

## 2017-08-08 LAB — CBC
HCT: 30.2 % — ABNORMAL LOW (ref 36.0–46.0)
Hemoglobin: 9.8 g/dL — ABNORMAL LOW (ref 12.0–15.0)
MCH: 26.1 pg (ref 26.0–34.0)
MCHC: 32.5 g/dL (ref 30.0–36.0)
MCV: 80.3 fL (ref 78.0–100.0)
PLATELETS: 366 10*3/uL (ref 150–400)
RBC: 3.76 MIL/uL — ABNORMAL LOW (ref 3.87–5.11)
RDW: 21.2 % — AB (ref 11.5–15.5)
WBC: 11.9 10*3/uL — ABNORMAL HIGH (ref 4.0–10.5)

## 2017-08-08 LAB — RENAL FUNCTION PANEL
Albumin: 2.4 g/dL — ABNORMAL LOW (ref 3.5–5.0)
Anion gap: 6 (ref 5–15)
CHLORIDE: 106 mmol/L (ref 101–111)
CO2: 28 mmol/L (ref 22–32)
CREATININE: 0.52 mg/dL (ref 0.44–1.00)
Calcium: 8.3 mg/dL — ABNORMAL LOW (ref 8.9–10.3)
GFR calc Af Amer: >60 mL/min (ref >60)
GFR calc non Af Amer: >60 mL/min (ref >60)
Glucose, Bld: 122 mg/dL — ABNORMAL HIGH (ref 65–99)
Phosphorus: 3.5 mg/dL (ref 2.5–4.6)
Potassium: 3.8 mmol/L (ref 3.5–5.1)
Sodium: 140 mmol/L (ref 135–145)

## 2017-08-08 LAB — BASIC METABOLIC PANEL
Anion gap: 7 (ref 5–15)
CALCIUM: 8.4 mg/dL — AB (ref 8.9–10.3)
CO2: 29 mmol/L (ref 22–32)
Chloride: 106 mmol/L (ref 101–111)
Creatinine, Ser: 0.46 mg/dL (ref 0.44–1.00)
GFR calc Af Amer: >60 mL/min (ref >60)
GLUCOSE: 124 mg/dL — AB (ref 65–99)
Potassium: 3.9 mmol/L (ref 3.5–5.1)
Sodium: 142 mmol/L (ref 135–145)

## 2017-08-08 LAB — BPAM RBC
BLOOD PRODUCT EXPIRATION DATE: 201903132359
ISSUE DATE / TIME: 201902161621
UNIT TYPE AND RH: 5100

## 2017-08-08 MED ORDER — HYDRALAZINE HCL 20 MG/ML IJ SOLN
5.0000 mg | INTRAMUSCULAR | Status: DC | PRN
  Administered 2017-08-08 – 2017-08-11 (×3): 5 mg via INTRAVENOUS
  Filled 2017-08-08 (×3): qty 1

## 2017-08-08 MED ORDER — ORAL CARE MOUTH RINSE
15.0000 mL | Freq: Two times a day (BID) | OROMUCOSAL | Status: DC
  Administered 2017-08-08 – 2017-08-10 (×3): 15 mL via OROMUCOSAL

## 2017-08-08 NOTE — Progress Notes (Signed)
Pt. currently on room air, BiPAP remains prn.

## 2017-08-08 NOTE — Plan of Care (Signed)
  Progressing Coping: Level of anxiety will decrease 08/08/2017 1844 - Progressing by Naomie Dean, RN Pain Managment: General experience of comfort will improve 08/08/2017 1844 - Progressing by Naomie Dean, RN

## 2017-08-08 NOTE — Progress Notes (Addendum)
PRN high blood pressure med requested. Awaiting orders. Will continue to monitor.   Orders placed  Faye Ramsay, MD  [facility] Pager [phone]  If 7PM-7AM, please contact night-coverage [email or web address] Password [email or web address]

## 2017-08-08 NOTE — Progress Notes (Signed)
Servo-I remains at bedside for prn BiPAP use.

## 2017-08-08 NOTE — Progress Notes (Signed)
Pt. started on Flutter for assistance on secretion removal and is able to use independently.
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
## 2017-08-08 NOTE — Progress Notes (Signed)
PT Cancellation Note  Patient Details Name: Tracey Hall MRN: [id number] DOB: [birth year]   Cancelled Treatment:    Reason Eval/Treat Not Completed: PT screened, no needs identified, will sign off ,  Patient requires total assistance for mobility.   Tracey Hall 08/08/2017, 8:24 AM Tresa Endo PT [phone]

## 2017-08-09 LAB — CBC
HEMATOCRIT: 31 % — AB (ref 36.0–46.0)
Hemoglobin: 9.8 g/dL — ABNORMAL LOW (ref 12.0–15.0)
MCH: 26.1 pg (ref 26.0–34.0)
MCHC: 31.6 g/dL (ref 30.0–36.0)
MCV: 82.4 fL (ref 78.0–100.0)
PLATELETS: 330 10*3/uL (ref 150–400)
RBC: 3.76 MIL/uL — ABNORMAL LOW (ref 3.87–5.11)
RDW: 22.3 % — AB (ref 11.5–15.5)
WBC: 18.6 10*3/uL — AB (ref 4.0–10.5)

## 2017-08-09 LAB — RENAL FUNCTION PANEL
ALBUMIN: 2.5 g/dL — AB (ref 3.5–5.0)
Anion gap: 9 (ref 5–15)
BUN: 9 mg/dL (ref 6–20)
CALCIUM: 8.7 mg/dL — AB (ref 8.9–10.3)
CO2: 28 mmol/L (ref 22–32)
Chloride: 106 mmol/L (ref 101–111)
Creatinine, Ser: 0.54 mg/dL (ref 0.44–1.00)
GFR calc Af Amer: >60 mL/min (ref >60)
Glucose, Bld: 144 mg/dL — ABNORMAL HIGH (ref 65–99)
PHOSPHORUS: 3.3 mg/dL (ref 2.5–4.6)
Potassium: 3.8 mmol/L (ref 3.5–5.1)
Sodium: 143 mmol/L (ref 135–145)

## 2017-08-09 LAB — MAGNESIUM: MAGNESIUM: 1.9 mg/dL (ref 1.7–2.4)
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
## 2017-08-09 NOTE — Progress Notes (Signed)
Report called to Rn 4W. Patient with no complaints at the current time. Will transfer via patients wheelchair.

## 2017-08-10 LAB — RENAL FUNCTION PANEL
Albumin: 2.6 g/dL — ABNORMAL LOW (ref 3.5–5.0)
Anion gap: 11 (ref 5–15)
BUN: 12 mg/dL (ref 6–20)
CHLORIDE: 104 mmol/L (ref 101–111)
CO2: 28 mmol/L (ref 22–32)
CREATININE: 0.59 mg/dL (ref 0.44–1.00)
Calcium: 8.6 mg/dL — ABNORMAL LOW (ref 8.9–10.3)
Glucose, Bld: 119 mg/dL — ABNORMAL HIGH (ref 65–99)
Phosphorus: 3.5 mg/dL (ref 2.5–4.6)
Potassium: 3.6 mmol/L (ref 3.5–5.1)
Sodium: 143 mmol/L (ref 135–145)

## 2017-08-10 LAB — CBC
HCT: 29.6 % — ABNORMAL LOW (ref 36.0–46.0)
Hemoglobin: 9.5 g/dL — ABNORMAL LOW (ref 12.0–15.0)
MCH: 26.6 pg (ref 26.0–34.0)
MCHC: 32.1 g/dL (ref 30.0–36.0)
MCV: 82.9 fL (ref 78.0–100.0)
PLATELETS: 352 10*3/uL (ref 150–400)
RBC: 3.57 MIL/uL — AB (ref 3.87–5.11)
RDW: 23.4 % — ABNORMAL HIGH (ref 11.5–15.5)
WBC: 18 10*3/uL — AB (ref 4.0–10.5)

## 2017-08-10 LAB — MAGNESIUM: Magnesium: 1.9 mg/dL (ref 1.7–2.4)

## 2017-08-10 MED ORDER — HYDROCORTISONE NA SUCCINATE PF 100 MG IJ SOLR
50.0000 mg | Freq: Two times a day (BID) | INTRAMUSCULAR | Status: DC
  Administered 2017-08-11: 50 mg via INTRAVENOUS
  Filled 2017-08-10: qty 2

## 2017-08-10 MED ORDER — TRAZODONE HCL 50 MG PO TABS
50.0000 mg | ORAL_TABLET | Freq: Every day | ORAL | Status: DC
  Administered 2017-08-11: 50 mg via ORAL
  Filled 2017-08-10: qty 1

## 2017-08-10 MED ORDER — IPRATROPIUM-ALBUTEROL 0.5-2.5 (3) MG/3ML IN SOLN
3.0000 mL | Freq: Three times a day (TID) | RESPIRATORY_TRACT | Status: DC
  Administered 2017-08-10 (×3): 3 mL via RESPIRATORY_TRACT
  Filled 2017-08-10 (×4): qty 3

## 2017-08-10 MED ORDER — HYDROMORPHONE HCL 1 MG/ML IJ SOLN
1.0000 mg | INTRAMUSCULAR | Status: DC | PRN
  Administered 2017-08-10 – 2017-08-11 (×3): 1 mg via INTRAVENOUS
  Filled 2017-08-10 (×3): qty 1
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
## 2017-08-10 NOTE — Progress Notes (Signed)
PT Cancellation Note  Patient Details Name: WYNELL HALBERG MRN: [id number] DOB: 1980-09-09   Cancelled Treatment:    Reason Eval/Treat Not Completed: PT screened, no needs identified, will sign off. No skilled PT needs identified. Patient has power chair, is assisted by family PTA.   Claretha Cooper 08/10/2017, 5:08 PM Tresa Endo PT [phone]

## 2017-08-10 NOTE — Care Management Important Message (Signed)
Important Message  Patient Details  Name: CAPTOLA TESCHNER MRN: [id number] Date of Birth: 25-Jun-1980   Medicare Important Message Given:  Yes    Kerin Salen 08/10/2017, 10:50 AMImportant Message  Patient Details  Name: SAMYUKTA CURA MRN: [id number] Date of Birth: 11/30/1980   Medicare Important Message Given:  Yes    Kerin Salen 08/10/2017, 10:50 AM

## 2017-08-11 ENCOUNTER — Telehealth: Payer: Self-pay

## 2017-08-11 LAB — CBC
HEMATOCRIT: 28.2 % — AB (ref 36.0–46.0)
Hemoglobin: 8.9 g/dL — ABNORMAL LOW (ref 12.0–15.0)
MCH: 26.7 pg (ref 26.0–34.0)
MCHC: 31.6 g/dL (ref 30.0–36.0)
MCV: 84.7 fL (ref 78.0–100.0)
PLATELETS: 353 10*3/uL (ref 150–400)
RBC: 3.33 MIL/uL — ABNORMAL LOW (ref 3.87–5.11)
RDW: 24.3 % — AB (ref 11.5–15.5)
WBC: 13.7 10*3/uL — AB (ref 4.0–10.5)

## 2017-08-11 LAB — RENAL FUNCTION PANEL
Albumin: 2.6 g/dL — ABNORMAL LOW (ref 3.5–5.0)
Anion gap: 10 (ref 5–15)
BUN: 21 mg/dL — AB (ref 6–20)
CHLORIDE: 104 mmol/L (ref 101–111)
CO2: 27 mmol/L (ref 22–32)
CREATININE: 0.83 mg/dL (ref 0.44–1.00)
Calcium: 8.4 mg/dL — ABNORMAL LOW (ref 8.9–10.3)
GFR calc Af Amer: >60 mL/min (ref >60)
GLUCOSE: 124 mg/dL — AB (ref 65–99)
POTASSIUM: 3.7 mmol/L (ref 3.5–5.1)
Phosphorus: 3.8 mg/dL (ref 2.5–4.6)
Sodium: 141 mmol/L (ref 135–145)

## 2017-08-11 MED ORDER — CLOBETASOL PROPIONATE 0.05 % EX OINT: 1.0000 "application " | TOPICAL_OINTMENT | Freq: Two times a day (BID) | CUTANEOUS | 0 refills | Status: DC

## 2017-08-11 MED ORDER — HYDROCORTISONE 5 MG PO TABS: 25.0000 mg | ORAL_TABLET | Freq: Every day | ORAL | Status: DC

## 2017-08-11 MED ORDER — SULFAMETHOXAZOLE-TRIMETHOPRIM 800-160 MG PO TABS: 1.0000 | ORAL_TABLET | Freq: Two times a day (BID) | ORAL | 0 refills | Status: DC

## 2017-08-11 MED ORDER — HYDROCORTISONE 5 MG PO TABS: 25.0000 mg | ORAL_TABLET | Freq: Every day | ORAL | 0 refills | Status: DC

## 2017-08-11 MED ORDER — METOPROLOL TARTRATE 25 MG PO TABS: 12.5000 mg | ORAL_TABLET | Freq: Two times a day (BID) | ORAL | 0 refills | Status: DC

## 2017-08-11 MED ORDER — PANTOPRAZOLE SODIUM 40 MG PO TBEC: 40.0000 mg | DELAYED_RELEASE_TABLET | Freq: Two times a day (BID) | ORAL | 0 refills | Status: DC

## 2017-08-11 NOTE — Telephone Encounter (Signed)
Patient left message asking to speak with PCP regarding care of her PICC line. Please see notes section of chart. Danley Danker, RN [facility] RN)

## 2017-08-11 NOTE — Progress Notes (Signed)
Pt was ready to discharge out the door. I explained to pt that she had a PICC line which was a risk for her to leave with it in. Pt states that she had to go while she had a ride and someone to carry her in the home. Pt asked if she could come back in the AM on this floor to get. Explained to pt again we really need her to stay. Explained to her that she could stay until her boyfriend gets off work. The problem was that he had to be at work by 1:00 PM. Pt states "the doctor told me that I was discharged at 1030 AM. "I explained that I had to make arrangements to get home." Pt left because she had help to get into her home and transportation.

## 2017-08-11 NOTE — Telephone Encounter (Signed)
Talked to patient about going to ED tonight to get PICC line out that even keeping clean and covered that at large risk for infection. She voiced that she would go to the ED tonight.
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
## 2017-08-11 NOTE — Progress Notes (Signed)
Went to the floor around 1245 to pull the PICC line out, per secretary patient left AMA. Waited for the patient to come back in the room as per secretary patient coming back with security and charge nurse. Got a call from my ADN that patient and staff are at education waiting. Went to education parking lot, patient in the car waiting to pull the PICC line out. Explained to patient that she has to lie down for 30 minutes after pulling out the picc to prevent the risk of  air embolism and bleeding. Patient refused to remove PICC line since she don't want to stay and lie down for 30 miutes, and don't want the risk to happen. She wants to go home now and and come back later to pull the PICC line out. Charge nurse went and explained to patient again but she still insist to go home. Patient left with PICC line intact.

## 2017-08-11 NOTE — Progress Notes (Signed)
Pt ready to D/C PICC still in place IV team had been called to remove PICC. Pt refusing to wait on IV team stated "he has to be at work by 1pm he cant lose his job, I have waited since 9am, we cannot wait anymore." Explained risk to pt of infection if she leaves with PICC in place and it is not cared for properly. Pt got on the elevator and left @ 1237. Pt was found by security at the education center entrance @ 1245. Explained to the pt again the risk of leaving with the PICC in place, as well as risk if she removed it herself. AC and administration came to education center to talk to pt. Pt already in the car, IV team arrived to remove PICC after chaplin got the pt to agree to have it removed in the car. Pt refused to have removed after IV team explained risk again. Pt says she will come back to the ED tonight to have it removed. Pt understands she will have to wait and will not be able to just walk in and have it removed when she arrives. Pt agrees to accept all risk of leaving with the PICC in place, pt is of sound mind A&Ox4 and left the premises with PICC in place around 1315.

## 2017-08-12 ENCOUNTER — Emergency Department (HOSPITAL_COMMUNITY)
Admission: EM | Admit: 2017-08-12 | Discharge: 2017-08-12 | Disposition: A | Discharge status: Home / Self Care | Payer: Medicare Other | Attending: Emergency Medicine | Admitting: Emergency Medicine

## 2017-08-12 ENCOUNTER — Emergency Department (HOSPITAL_COMMUNITY): Payer: Medicare Other

## 2017-08-12 ENCOUNTER — Ambulatory Visit: Payer: Medicare Other | Admitting: Family Medicine

## 2017-08-12 DIAGNOSIS — J90 Pleural effusion, not elsewhere classified: Secondary | ICD-10-CM | POA: Diagnosis not present

## 2017-08-12 DIAGNOSIS — J189 Pneumonia, unspecified organism: Secondary | ICD-10-CM | POA: Diagnosis not present

## 2017-08-12 DIAGNOSIS — R0602 Shortness of breath: Secondary | ICD-10-CM

## 2017-08-12 DIAGNOSIS — R05 Cough: Secondary | ICD-10-CM | POA: Diagnosis not present

## 2017-08-12 DIAGNOSIS — G8253 Quadriplegia, C5-C7 complete: Secondary | ICD-10-CM | POA: Diagnosis not present

## 2017-08-12 MED ORDER — IBUPROFEN 800 MG PO TABS
800.0000 mg | ORAL_TABLET | Freq: Once | ORAL | Status: AC
  Administered 2017-08-12: 800 mg via ORAL
  Filled 2017-08-12: qty 1

## 2017-08-12 MED ORDER — IPRATROPIUM-ALBUTEROL 0.5-2.5 (3) MG/3ML IN SOLN
3.0000 mL | Freq: Once | RESPIRATORY_TRACT | Status: AC
  Administered 2017-08-12: 3 mL via RESPIRATORY_TRACT
  Filled 2017-08-12: qty 3

## 2017-08-12 MED ORDER — AEROCHAMBER PLUS W/MASK MISC
1.0000 | Freq: Once | Status: DC
  Filled 2017-08-12: qty 1

## 2017-08-12 MED ORDER — AEROCHAMBER Z-STAT PLUS/MEDIUM MISC
Status: AC
  Filled 2017-08-12: qty 1

## 2017-08-12 MED ORDER — LEVOFLOXACIN 750 MG PO TABS: 750.0000 mg | ORAL_TABLET | Freq: Every day | ORAL | 0 refills | Status: DC

## 2017-08-12 MED ORDER — GUAIFENESIN ER 600 MG PO TB12: 1200.0000 mg | ORAL_TABLET | Freq: Two times a day (BID) | ORAL | 0 refills | Status: DC

## 2017-08-12 MED ORDER — OXYCODONE-ACETAMINOPHEN 5-325 MG PO TABS: 1.0000 | ORAL_TABLET | ORAL | 0 refills | Status: DC | PRN

## 2017-08-12 MED ORDER — ALBUTEROL SULFATE HFA 108 (90 BASE) MCG/ACT IN AERS
2.0000 | INHALATION_SPRAY | Freq: Once | RESPIRATORY_TRACT | Status: AC
  Administered 2017-08-12: 2 via RESPIRATORY_TRACT
  Filled 2017-08-12: qty 6.7

## 2017-08-12 MED ORDER — AEROCHAMBER Z-STAT PLUS/MEDIUM MISC
1.0000 | Freq: Once | Status: AC
  Administered 2017-08-12: 1

## 2017-08-12 MED ORDER — AEROCHAMBER PLUS FLO-VU MEDIUM MISC: 1.0000 | Freq: Once | Status: DC

## 2017-08-12 NOTE — ED Notes (Signed)
Bed: [facility]1 Expected date:  Expected time:  Means of arrival:  Comments: 
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
## 2017-08-12 NOTE — ED Notes (Signed)
Pt returned from xray, she is incontinent in her chair and urine all over the floor. Pt brought back to treatment room to change and wait on radiology results/ provider disposition.

## 2017-08-12 NOTE — ED Notes (Signed)
IV team at bedside 

## 2017-08-12 NOTE — Discharge Instructions (Signed)
Take your inhaler 2 puffs every 4 hours.  Practice pulmonary toilet. Return for any worsening in your symptoms.  Contact a health care provider if: Your symptoms do not get better or they get worse. Your symptoms come back after you have finished taking your antibiotics. Get help right away if: You have trouble breathing. You have confusion or difficulty thinking.

## 2017-08-12 NOTE — ED Triage Notes (Addendum)
AC has brought pt d/c paperwork from discharge from the hospital. I have given it to the patient. IV team reports they are coming to the ED to remove PICC line at this time.

## 2017-08-12 NOTE — ED Notes (Signed)
ED Provider at bedside. 

## 2017-08-12 NOTE — ED Triage Notes (Signed)
Pt initially arrived to the ED lobby stating that she wanted her PICC line removed because she was in the hospital today and left with the PICC line still in. [facility] has spoke with the patient and the IV team.  Once in triage, the patient then was c/o headache, chest pain, body pain "on and off today", states that she just "feels terrible". She says that she has not had any of her pain medications since this morning.

## 2017-08-12 NOTE — ED Notes (Signed)
Misha, RN [facility] speaking with patient in triage 1 at this time.

## 2017-09-07 ENCOUNTER — Telehealth: Payer: Self-pay | Admitting: Family Medicine

## 2017-09-07 NOTE — Telephone Encounter (Signed)
**  After Hours/ Emergency Line Call**  Received a call to report that Tracey Hall having abdominal pain. Patient is well known to me as I am her PCP. States has had severe abdominal pain that started 2 days ago and has been worsening. She feels like has been urinating more than usual, is not able to tell if she has dysuria due to her quadriplegia but feels she may have some sort of infection as she is getting skin blisters which is usually how she can tell if she is developing an infection. No fevers, took her temp and it was 98.33F. No nausea or vomiting.  Recommended that she should go to ER. Patient requested same day appointment for 09/08/17 but none available until 09/09/17. Patient stated that she would go to ER tomorrow. Red flags discussed including nausea, vomiting, fevers, worsening abdominal pain that she should go to ER tonight. She voiced good understanding.  Bufford Lope, DO PGY-2, [facility] 09/07/2017 11:35 PM

## 2018-01-17 ENCOUNTER — Telehealth: Payer: Self-pay | Admitting: *Deleted

## 2018-01-17 DIAGNOSIS — R300 Dysuria: Secondary | ICD-10-CM

## 2018-01-17 DIAGNOSIS — N399 Disorder of urinary system, unspecified: Secondary | ICD-10-CM

## 2018-01-17 NOTE — Telephone Encounter (Signed)
Urine specimen cup x2 and cath kit placed up front for patient pick up. Discussed that if urine sample unable to be delivered within 1 hour of collection then sample needs to be refrigerated.

## 2018-01-17 NOTE — Telephone Encounter (Signed)
Pt thinks that she has a UTI because she is having back pain and possibly a fever ( does not have a thermometer).   She is requesting since she is quadriplegic and has someone who cath's her, to have someone pick up a kit and bring back the sample.  Advised that she would need an appt for that.   She is unhappy with this response because "it is so hard for me to come in" and wants me to ask the doctor because we have done this before.  Will forward to MD. [place], Jessica Dawn, CMA  

## 2018-02-08 DIAGNOSIS — F4320 Adjustment disorder, unspecified: Secondary | ICD-10-CM | POA: Diagnosis not present

## 2018-02-09 DIAGNOSIS — F4320 Adjustment disorder, unspecified: Secondary | ICD-10-CM | POA: Diagnosis not present

## 2018-02-16 DIAGNOSIS — F4320 Adjustment disorder, unspecified: Secondary | ICD-10-CM | POA: Diagnosis not present

## 2018-02-24 DIAGNOSIS — F4320 Adjustment disorder, unspecified: Secondary | ICD-10-CM | POA: Diagnosis not present

## 2018-03-22 DIAGNOSIS — F4320 Adjustment disorder, unspecified: Secondary | ICD-10-CM | POA: Diagnosis not present

## 2018-04-22 DIAGNOSIS — R7881 Bacteremia: Secondary | ICD-10-CM

## 2018-04-22 DIAGNOSIS — B9620 Unspecified Escherichia coli [E. coli] as the cause of diseases classified elsewhere: Secondary | ICD-10-CM

## 2018-04-22 HISTORY — DX: Bacteremia: R78.81

## 2018-04-22 HISTORY — DX: Unspecified Escherichia coli (E. coli) as the cause of diseases classified elsewhere: B96.20

## 2018-04-29 DIAGNOSIS — F4320 Adjustment disorder, unspecified: Secondary | ICD-10-CM | POA: Diagnosis not present

## 2018-05-11 ENCOUNTER — Encounter (HOSPITAL_COMMUNITY): Payer: Self-pay | Admitting: Radiology

## 2018-05-11 ENCOUNTER — Emergency Department (HOSPITAL_COMMUNITY): Payer: Medicare Other

## 2018-05-11 ENCOUNTER — Ambulatory Visit (INDEPENDENT_AMBULATORY_CARE_PROVIDER_SITE_OTHER): Payer: Medicare Other | Admitting: Family Medicine

## 2018-05-11 ENCOUNTER — Inpatient Hospital Stay (HOSPITAL_COMMUNITY)
Admission: EM | Admit: 2018-05-11 | Discharge: 2018-05-14 | DRG: 871 | Disposition: A | Discharge status: Home / Self Care | Payer: Medicare Other | Attending: Internal Medicine | Admitting: Internal Medicine

## 2018-05-11 ENCOUNTER — Other Ambulatory Visit: Payer: Self-pay

## 2018-05-11 ENCOUNTER — Encounter: Payer: Self-pay | Admitting: Family Medicine

## 2018-05-11 ENCOUNTER — Telehealth: Payer: Self-pay | Admitting: Family Medicine

## 2018-05-11 VITALS — BP 166/98 | HR 111 | Temp 99.2°F | Ht 65.0 in

## 2018-05-11 DIAGNOSIS — Z91040 Latex allergy status: Secondary | ICD-10-CM

## 2018-05-11 DIAGNOSIS — D509 Iron deficiency anemia, unspecified: Secondary | ICD-10-CM | POA: Diagnosis present

## 2018-05-11 DIAGNOSIS — N3000 Acute cystitis without hematuria: Secondary | ICD-10-CM | POA: Diagnosis present

## 2018-05-11 DIAGNOSIS — R000 Tachycardia, unspecified: Secondary | ICD-10-CM | POA: Diagnosis not present

## 2018-05-11 DIAGNOSIS — Z7401 Bed confinement status: Secondary | ICD-10-CM

## 2018-05-11 DIAGNOSIS — Z6837 Body mass index (BMI) 37.0-37.9, adult: Secondary | ICD-10-CM

## 2018-05-11 DIAGNOSIS — Z823 Family history of stroke: Secondary | ICD-10-CM

## 2018-05-11 DIAGNOSIS — N319 Neuromuscular dysfunction of bladder, unspecified: Secondary | ICD-10-CM | POA: Diagnosis present

## 2018-05-11 DIAGNOSIS — R109 Unspecified abdominal pain: Secondary | ICD-10-CM | POA: Diagnosis not present

## 2018-05-11 DIAGNOSIS — A419 Sepsis, unspecified organism: Secondary | ICD-10-CM | POA: Diagnosis not present

## 2018-05-11 DIAGNOSIS — Z79899 Other long term (current) drug therapy: Secondary | ICD-10-CM

## 2018-05-11 DIAGNOSIS — E876 Hypokalemia: Secondary | ICD-10-CM | POA: Diagnosis present

## 2018-05-11 DIAGNOSIS — I959 Hypotension, unspecified: Secondary | ICD-10-CM | POA: Diagnosis not present

## 2018-05-11 DIAGNOSIS — Z993 Dependence on wheelchair: Secondary | ICD-10-CM

## 2018-05-11 DIAGNOSIS — R1030 Lower abdominal pain, unspecified: Secondary | ICD-10-CM | POA: Diagnosis not present

## 2018-05-11 DIAGNOSIS — E669 Obesity, unspecified: Secondary | ICD-10-CM | POA: Diagnosis present

## 2018-05-11 DIAGNOSIS — G8254 Quadriplegia, C5-C7 incomplete: Secondary | ICD-10-CM | POA: Diagnosis present

## 2018-05-11 DIAGNOSIS — G8250 Quadriplegia, unspecified: Secondary | ICD-10-CM

## 2018-05-11 DIAGNOSIS — R509 Fever, unspecified: Secondary | ICD-10-CM | POA: Diagnosis not present

## 2018-05-11 DIAGNOSIS — N390 Urinary tract infection, site not specified: Secondary | ICD-10-CM | POA: Diagnosis not present

## 2018-05-11 DIAGNOSIS — D649 Anemia, unspecified: Secondary | ICD-10-CM | POA: Diagnosis present

## 2018-05-11 DIAGNOSIS — R1084 Generalized abdominal pain: Secondary | ICD-10-CM | POA: Diagnosis not present

## 2018-05-11 DIAGNOSIS — Z87440 Personal history of urinary (tract) infections: Secondary | ICD-10-CM | POA: Diagnosis not present

## 2018-05-11 DIAGNOSIS — R5383 Other fatigue: Secondary | ICD-10-CM | POA: Diagnosis not present

## 2018-05-11 LAB — COMPREHENSIVE METABOLIC PANEL
ALBUMIN: 3.4 g/dL — AB (ref 3.5–5.0)
ALK PHOS: 70 U/L (ref 38–126)
ALT: 15 U/L (ref 0–44)
ANION GAP: 9 (ref 5–15)
AST: 25 U/L (ref 15–41)
BUN: 10 mg/dL (ref 6–20)
CALCIUM: 8.3 mg/dL — AB (ref 8.9–10.3)
CHLORIDE: 105 mmol/L (ref 98–111)
CO2: 26 mmol/L (ref 22–32)
Creatinine, Ser: 0.65 mg/dL (ref 0.44–1.00)
GFR calc Af Amer: >60 mL/min (ref >60)
GFR calc non Af Amer: >60 mL/min (ref >60)
GLUCOSE: 122 mg/dL — AB (ref 70–99)
POTASSIUM: 3.3 mmol/L — AB (ref 3.5–5.1)
SODIUM: 140 mmol/L (ref 135–145)
Total Bilirubin: 0.6 mg/dL (ref 0.3–1.2)
Total Protein: 7 g/dL (ref 6.5–8.1)

## 2018-05-11 LAB — CBC WITH DIFFERENTIAL/PLATELET
Abs Immature Granulocytes: 0.12 10*3/uL — ABNORMAL HIGH (ref 0.00–0.07)
BASOS ABS: 0 10*3/uL (ref 0.0–0.1)
Basophils Relative: 0 %
EOS PCT: 0 %
Eosinophils Absolute: 0 10*3/uL (ref 0.0–0.5)
HCT: 26 % — ABNORMAL LOW (ref 36.0–46.0)
HEMOGLOBIN: 7.7 g/dL — AB (ref 12.0–15.0)
Immature Granulocytes: 1 %
LYMPHS PCT: 9 %
Lymphs Abs: 1.7 10*3/uL (ref 0.7–4.0)
MCH: 21.2 pg — ABNORMAL LOW (ref 26.0–34.0)
MCHC: 29.6 g/dL — AB (ref 30.0–36.0)
MCV: 71.6 fL — ABNORMAL LOW (ref 80.0–100.0)
Monocytes Absolute: 1.1 10*3/uL — ABNORMAL HIGH (ref 0.1–1.0)
Monocytes Relative: 7 %
NRBC: 0.1 % (ref 0.0–0.2)
Neutro Abs: 14.6 10*3/uL — ABNORMAL HIGH (ref 1.7–7.7)
Neutrophils Relative %: 83 %
Platelets: 492 10*3/uL — ABNORMAL HIGH (ref 150–400)
RBC: 3.63 MIL/uL — AB (ref 3.87–5.11)
RDW: 18.5 % — ABNORMAL HIGH (ref 11.5–15.5)
WBC: 17.6 10*3/uL — AB (ref 4.0–10.5)

## 2018-05-11 LAB — POCT UA - MICROSCOPIC ONLY

## 2018-05-11 LAB — POCT URINALYSIS DIP (MANUAL ENTRY)
BILIRUBIN UA: NEGATIVE
GLUCOSE UA: NEGATIVE mg/dL
Ketones, POC UA: NEGATIVE mg/dL
Nitrite, UA: POSITIVE — AB
Protein Ur, POC: 30 mg/dL — AB
SPEC GRAV UA: 1.02 (ref 1.010–1.025)
Urobilinogen, UA: 1 E.U./dL
pH, UA: 6.5 (ref 5.0–8.0)

## 2018-05-11 LAB — I-STAT CG4 LACTIC ACID, ED: Lactic Acid, Venous: 1.21 mmol/L (ref 0.5–1.9)

## 2018-05-11 LAB — LIPASE, BLOOD: Lipase: 18 U/L (ref 11–51)

## 2018-05-11 LAB — I-STAT BETA HCG BLOOD, ED (MC, WL, AP ONLY)

## 2018-05-11 MED ORDER — SODIUM CHLORIDE 0.9 % IV SOLN
1.0000 g | Freq: Once | INTRAVENOUS | Status: AC
  Administered 2018-05-11: 1 g via INTRAVENOUS
  Filled 2018-05-11: qty 10

## 2018-05-11 MED ORDER — SODIUM CHLORIDE (PF) 0.9 % IJ SOLN
INTRAMUSCULAR | Status: AC
  Filled 2018-05-11: qty 50

## 2018-05-11 MED ORDER — PANTOPRAZOLE SODIUM 40 MG PO TBEC
40.0000 mg | DELAYED_RELEASE_TABLET | Freq: Every day | ORAL | Status: DC
  Administered 2018-05-12 – 2018-05-14 (×2): 40 mg via ORAL
  Filled 2018-05-11 (×2): qty 1

## 2018-05-11 MED ORDER — HYDROMORPHONE HCL 1 MG/ML IJ SOLN
0.5000 mg | Freq: Once | INTRAMUSCULAR | Status: AC
  Administered 2018-05-11: 0.5 mg via INTRAVENOUS
  Filled 2018-05-11: qty 1

## 2018-05-11 MED ORDER — SODIUM CHLORIDE 0.9 % IV BOLUS
1000.0000 mL | Freq: Once | INTRAVENOUS | Status: AC
  Administered 2018-05-11: 1000 mL via INTRAVENOUS

## 2018-05-11 MED ORDER — MORPHINE SULFATE (PF) 4 MG/ML IV SOLN
4.0000 mg | INTRAVENOUS | Status: DC | PRN
  Administered 2018-05-11 – 2018-05-14 (×7): 4 mg via INTRAVENOUS
  Filled 2018-05-11 (×7): qty 1

## 2018-05-11 MED ORDER — ACETAMINOPHEN 500 MG PO TABS
1000.0000 mg | ORAL_TABLET | Freq: Once | ORAL | Status: AC
  Administered 2018-05-11: 1000 mg via ORAL
  Filled 2018-05-11: qty 2

## 2018-05-11 MED ORDER — POTASSIUM CHLORIDE CRYS ER 20 MEQ PO TBCR
40.0000 meq | EXTENDED_RELEASE_TABLET | Freq: Once | ORAL | Status: AC
  Administered 2018-05-11: 40 meq via ORAL
  Filled 2018-05-11: qty 2

## 2018-05-11 MED ORDER — HEPARIN SODIUM (PORCINE) 5000 UNIT/ML IJ SOLN: 5000.0000 [IU] | Freq: Three times a day (TID) | INTRAMUSCULAR | Status: DC

## 2018-05-11 MED ORDER — MORPHINE SULFATE (PF) 4 MG/ML IV SOLN
4.0000 mg | Freq: Once | INTRAVENOUS | Status: AC
  Administered 2018-05-11: 4 mg via INTRAVENOUS
  Filled 2018-05-11: qty 1

## 2018-05-11 MED ORDER — IOPAMIDOL (ISOVUE-300) INJECTION 61%
INTRAVENOUS | Status: AC
  Filled 2018-05-11: qty 100

## 2018-05-11 MED ORDER — ASPIRIN-ACETAMINOPHEN-CAFFEINE 250-250-65 MG PO TABS
2.0000 | ORAL_TABLET | Freq: Every day | ORAL | Status: DC | PRN
  Administered 2018-05-11: 2 via ORAL
  Filled 2018-05-11 (×3): qty 2

## 2018-05-11 MED ORDER — FERROUS SULFATE 325 (65 FE) MG PO TABS
325.0000 mg | ORAL_TABLET | Freq: Two times a day (BID) | ORAL | Status: DC
  Administered 2018-05-12 – 2018-05-14 (×5): 325 mg via ORAL
  Filled 2018-05-11 (×4): qty 1

## 2018-05-11 MED ORDER — IOPAMIDOL (ISOVUE-300) INJECTION 61%
100.0000 mL | Freq: Once | INTRAVENOUS | Status: AC | PRN
  Administered 2018-05-11: 100 mL via INTRAVENOUS

## 2018-05-11 MED ORDER — OXYCODONE-ACETAMINOPHEN 5-325 MG PO TABS
1.0000 | ORAL_TABLET | ORAL | Status: DC | PRN
  Administered 2018-05-12 – 2018-05-14 (×7): 1 via ORAL
  Filled 2018-05-11 (×8): qty 1

## 2018-05-11 MED ORDER — SODIUM CHLORIDE 0.9 % IV SOLN
Freq: Once | INTRAVENOUS | Status: AC
  Administered 2018-05-11: 22:00:00 via INTRAVENOUS

## 2018-05-11 MED ORDER — SODIUM CHLORIDE 0.9 % IV SOLN
2.0000 g | INTRAVENOUS | Status: DC
  Administered 2018-05-12 – 2018-05-14 (×3): 2 g via INTRAVENOUS
  Filled 2018-05-11 (×3): qty 2

## 2018-05-11 MED ORDER — HYDRALAZINE HCL 20 MG/ML IJ SOLN: 10.0000 mg | INTRAMUSCULAR | Status: DC | PRN

## 2018-05-11 NOTE — ED Notes (Signed)
ED Provider at bedside. 

## 2018-05-11 NOTE — Progress Notes (Signed)
MEWS Guidelines - (patients age 37 and over) Pt in the RED due to fever - will monitor & treat accordingly  Red - At High Risk for Deterioration Yellow - At risk for Deterioration  1. Go to room and assess patient 2. Validate data. Is this patient's baseline? If data confirmed: 3. Is this an acute change? 4. Administer prn meds/treatments as ordered. 5. Note Sepsis score 6. Review goals of care 7. Notify RRT nurse, Camera operator and Provider. 8. Ask Provider to come to bedside.  9. Document patient condition/interventions/response. 10. Increase frequency of vital signs and focused assessments to at least q15 minutes x 4, then q30 minutes x2. - If stable, then q1h x3, then q4h x3 and then q8h or dept. routine. - If unstable, contact Provider, RRT nurse and prepare for possible transfer. 11. Add entry in progress notes using the smart phrase ".MEWPilot". 1. Go to room and assess patient 2. Validate data. Is this patient's baseline? If data confirmed: 3. Is this an acute change? 4. Administer prn meds/treatments as ordered? 5. Note Sepsis score 6. Review goals of care 7. Call RRT nurse for initial change and as needed. 8. Sports coach and Provider 9. Document patient condition/interventions/response. 10. Increase frequency of vital signs and focused assessments to at least q2h x2. - If stable, then q4h x4 and then q8h or dept. routine. - If unstable, contact Provider and RRT nurse and prepare for possible transfer. 11. Add entry in progress notes using the smart phrase ".MEWPilot".  Green - Likely stable Lavender - Comfort Care Only  1. Continue routine/ordered monitoring.  2. Review goals of care. 1. Continue routine/ordered monitoring. 2. Review goals of care.    Lab Results WBC  Date/Time Value Ref Range Status  05/11/2018 12:20 PM 17.6 (H) 4.0 - 10.5 K/uL Final  08/11/2017 04:16 AM 13.7 (H) 4.0 - 10.5 K/uL Final  08/10/2017 04:43 AM 18.0 (H) 4.0 - 10.5 K/uL Final    Neutrophils Relative %  Date/Time Value Ref Range Status  05/11/2018 12:20 PM 83 % Final  08/02/2017 11:47 PM 76 % Final  08/01/2017 10:19 PM 77 % Final   pCO2 arterial  Date/Time Value Ref Range Status  08/04/2017 04:07 PM 48.7 (H) 32.0 - 48.0 mmHg Final  08/03/2017 12:20 AM 35.9 32.0 - 48.0 mmHg Final  08/02/2017 03:23 PM 27.8 (L) 32.0 - 48.0 mmHg Final   Lactic Acid, Venous  Date/Time Value Ref Range Status  05/11/2018 12:48 PM 1.21 0.5 - 1.9 mmol/L Final  08/02/2017 10:19 PM 1.4 0.5 - 1.9 mmol/L Final    Comment:    Performed at [facility], [place] [address], [place], [place] [address]  08/02/2017 12:54 PM 2.7 (HH) 0.5 - 1.9 mmol/L Final    Comment:    CRITICAL RESULT CALLED TO, READ BACK BY AND VERIFIED WITHLissa Hoard RN AT 5176 08/02/17 MULLINS,T Performed at [facility], [place] [address] [place], [place] [address]    pCO2, Ven  Date/Time Value Ref Range Status  08/02/2017 01:01 PM 30.5 (L) 44.0 - 60.0 mmHg Final

## 2018-05-11 NOTE — ED Notes (Signed)
Bed: WN02 Expected date:  Expected time:  Means of arrival:  Comments: EMS/quadruplegia/uti

## 2018-05-11 NOTE — ED Notes (Signed)
Pt agrees to be admitted. Transferring to [facility]

## 2018-05-11 NOTE — ED Triage Notes (Signed)
Transported by [facility] from [facility]--diagnosed with UTI, reports cloudy urine, lower abdominal pain. AAO x 4.

## 2018-05-11 NOTE — ED Provider Notes (Signed)
Patient seen after sign out from prior provider.   CT suggest possible early right sided pyelonephritis.  Patient appears to be fairly comfortable at time of my reevaluation.  Hospitalist service is aware of case and will evaluate for admission.   Valarie Merino, MD 05/11/18 [phone]

## 2018-05-11 NOTE — Telephone Encounter (Signed)
Family medicine telephone encounter  Received after hours call from patient.  Informed me that she is having painful urination, fevers, abdominal pain.  She has had similar symptoms in the past and is always been a urinary tract infection or a "problem with her kidneys".  She denies any flank or back pain.  She has had her symptoms started on 05/09/2018 with some minor abdominal pain.  This pain did not resolve but her reason for calling tonight was that her pain was on a "whole the level".  Patient symptoms do sound consistent with either urinary tract infection or possible pyelonephritis.  Gave options for evaluation for patient.  Options given were to come to emergency department tonight as I cannot be confident in the diagnosis with examining patient, going to urgent care in the morning, or getting a same-day appointment in [facility].  Patient opted to have appointment scheduled in [facility], did not feel need to come to ED.  Schedule patient a same-day visit with Dr. Ky Barban at 10:10.  Would likely benefit from UA and possible treatment of UTI depending on results.  Also rule out pyelonephritis with physical exam.  Patient voiced understanding and very appreciative.  Guadalupe Dawn MD PGY-2 Family Medicine Resident

## 2018-05-11 NOTE — Assessment & Plan Note (Addendum)
U/A indicative of UTI with moderate leuks and +nitrites. Concern for urosepsis given tachycardia, diaphoretic, back pain. Urine culture collected. Sent to ED for fluid resuscitation and blood cultures with likely admission for IV antibiotics.

## 2018-05-11 NOTE — ED Notes (Addendum)
Pts daughter continues to place blankets onpt after it has been explained that pt still has a high fever and blankets can not be used until fever comes down. (Pt temp 103.5 rectal @ 1858). Pt then began yelling at nurse stating " I'm just going to leave, I'm reaching my limits and I don't have anything I need. How long will it take for discharge?" Explained to pt that MD is placing new orders for her at this time and that she would be receiving medication for pain shortly. Pt and daughter refusing to comply with nurse requests to remove blankets.

## 2018-05-11 NOTE — ED Notes (Addendum)
Called report to floor but explained to accepting nurse that pt has been noncompliant and requesting to leave facility. Waiting for admitting physician to speak to pt about risks of leaving AMA.

## 2018-05-11 NOTE — Progress Notes (Signed)
  Subjective:   Patient ID: Tracey Hall    DOB: 1980-09-11, 37 y.o. female   MRN: [id number]  Tracey Hall is a 37 y.o. female with a history of asthma, PUD, quadriplegia, depression, neurogenic bladder, obesity here for   Dysuria Patient presents with 1 day h/o dysuria, nausea, headache.  Also reports fever at home with T-max 102F last night.  Now reports diffuse pain, sweating with chills, blurry vision, R sided back pain. Denies any blood in urine, has never had a kidney stone. Has h/o quadriplegia, wheelchair bound and requires pull ups, does not require cath. Has h/o recurrent UTIs, most recent 07/2017 with culture growing E coli and aerococcus urinae with previous h/o urosepsis. Previously grew E coli and aerococcus urinae in urine, blood culture at last admission grew [place].    Review of Systems:  Per HPI.  [place], medications and smoking status reviewed.  Objective:   BP (!) 166/98   Pulse (!) 111   Temp 99.2 F (37.3 C) (Oral)   Ht 5\' 5"  (1.651 m)   LMP 05/02/2018 (Approximate)   SpO2 99%   BMI 34.95 kg/m  Vitals and nursing note reviewed.  General: obese female, in acute distress, diaphoretic. Wheelchair bound CV: regular rhythm, tachycardic without murmurs, rubs, or gallops Lungs: clear to auscultation bilaterally with normal work of breathing Abdomen: soft, exquisitely tender to palpation. Moving around, can't get comfortable, unable to assess CVA tenderness Skin: warm, diaphoretic, no rashes or lesions  Assessment & Plan:   Recurrent UTI U/A indicative of UTI with moderate leuks and +nitrites. Concern for urosepsis given tachycardia, diaphoretic, back pain. Urine culture collected. Sent to ED for fluid resuscitation and blood cultures with likely admission for IV antibiotics.  Orders Placed This Encounter  Procedures  . Urine Culture  . POCT urinalysis dipstick  . POCT UA - Microscopic Only   No orders of the defined types were placed in this  encounter.   Rory Percy, DO PGY-2, [facility] 05/11/2018 12:14 PM
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
## 2018-05-11 NOTE — ED Notes (Signed)
Patient refused influenza swab. Provider aware.
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
## 2018-05-12 ENCOUNTER — Other Ambulatory Visit: Payer: Self-pay

## 2018-05-12 LAB — BASIC METABOLIC PANEL
ANION GAP: 10 (ref 5–15)
BUN: 7 mg/dL (ref 6–20)
CALCIUM: 7.2 mg/dL — AB (ref 8.9–10.3)
CO2: 21 mmol/L — ABNORMAL LOW (ref 22–32)
CREATININE: 0.5 mg/dL (ref 0.44–1.00)
Chloride: 106 mmol/L (ref 98–111)
GFR calc non Af Amer: >60 mL/min (ref >60)
Glucose, Bld: 131 mg/dL — ABNORMAL HIGH (ref 70–99)
Potassium: 3.2 mmol/L — ABNORMAL LOW (ref 3.5–5.1)
SODIUM: 137 mmol/L (ref 135–145)

## 2018-05-12 LAB — BLOOD CULTURE ID PANEL (REFLEXED)
Acinetobacter baumannii: NOT DETECTED
CANDIDA ALBICANS: NOT DETECTED
CANDIDA GLABRATA: NOT DETECTED
Candida krusei: NOT DETECTED
Candida parapsilosis: NOT DETECTED
Candida tropicalis: NOT DETECTED
ENTEROBACTER CLOACAE COMPLEX: NOT DETECTED
ENTEROBACTERIACEAE SPECIES: NOT DETECTED
Enterococcus species: NOT DETECTED
Escherichia coli: NOT DETECTED
Haemophilus influenzae: NOT DETECTED
KLEBSIELLA PNEUMONIAE: NOT DETECTED
Klebsiella oxytoca: NOT DETECTED
Listeria monocytogenes: NOT DETECTED
Methicillin resistance: NOT DETECTED
NEISSERIA MENINGITIDIS: NOT DETECTED
Proteus species: NOT DETECTED
Pseudomonas aeruginosa: NOT DETECTED
STREPTOCOCCUS SPECIES: NOT DETECTED
Serratia marcescens: NOT DETECTED
Staphylococcus aureus (BCID): NOT DETECTED
Staphylococcus species: DETECTED — AB
Streptococcus agalactiae: NOT DETECTED
Streptococcus pneumoniae: NOT DETECTED
Streptococcus pyogenes: NOT DETECTED

## 2018-05-12 LAB — URINALYSIS, ROUTINE W REFLEX MICROSCOPIC
BILIRUBIN URINE: NEGATIVE
GLUCOSE, UA: NEGATIVE mg/dL
HGB URINE DIPSTICK: NEGATIVE
KETONES UR: 5 mg/dL — AB
NITRITE: NEGATIVE
PROTEIN: 30 mg/dL — AB
Specific Gravity, Urine: 1.04 — ABNORMAL HIGH (ref 1.005–1.030)
pH: 5 (ref 5.0–8.0)

## 2018-05-12 LAB — CBC
HCT: 22.2 % — ABNORMAL LOW (ref 36.0–46.0)
Hemoglobin: 6.4 g/dL — CL (ref 12.0–15.0)
MCH: 20.7 pg — ABNORMAL LOW (ref 26.0–34.0)
MCHC: 28.8 g/dL — ABNORMAL LOW (ref 30.0–36.0)
MCV: 71.8 fL — ABNORMAL LOW (ref 80.0–100.0)
NRBC: 0 % (ref 0.0–0.2)
PLATELETS: 556 10*3/uL — AB (ref 150–400)
RBC: 3.09 MIL/uL — ABNORMAL LOW (ref 3.87–5.11)
RDW: 18.5 % — ABNORMAL HIGH (ref 11.5–15.5)
WBC: 13.5 10*3/uL — AB (ref 4.0–10.5)

## 2018-05-12 LAB — IRON AND TIBC
IRON: 5 ug/dL — AB (ref 28–170)
Saturation Ratios: 2 % — ABNORMAL LOW (ref 10.4–31.8)
TIBC: 270 ug/dL (ref 250–450)
UIBC: 265 ug/dL

## 2018-05-12 LAB — PREPARE RBC (CROSSMATCH)

## 2018-05-12 LAB — FERRITIN: FERRITIN: 46 ng/mL (ref 11–307)

## 2018-05-12 MED ORDER — SODIUM CHLORIDE 0.9 % IV BOLUS
500.0000 mL | Freq: Once | INTRAVENOUS | Status: AC
  Administered 2018-05-12: 500 mL via INTRAVENOUS

## 2018-05-12 MED ORDER — POTASSIUM CHLORIDE CRYS ER 20 MEQ PO TBCR
40.0000 meq | EXTENDED_RELEASE_TABLET | Freq: Once | ORAL | Status: AC
  Administered 2018-05-12: 40 meq via ORAL
  Filled 2018-05-12: qty 2

## 2018-05-12 MED ORDER — SODIUM CHLORIDE 0.9 % IV BOLUS: 500.0000 mL | Freq: Once | INTRAVENOUS | Status: DC

## 2018-05-12 MED ORDER — SODIUM CHLORIDE 0.9% IV SOLUTION
Freq: Once | INTRAVENOUS | Status: AC
  Administered 2018-05-12: 13:00:00 via INTRAVENOUS

## 2018-05-12 MED ORDER — SODIUM CHLORIDE 0.9 % IV SOLN
25.0000 mg | Freq: Once | INTRAVENOUS | Status: AC
  Administered 2018-05-12: 25 mg via INTRAVENOUS
  Filled 2018-05-12: qty 0.5

## 2018-05-12 MED ORDER — SODIUM CHLORIDE 0.9 % IV SOLN
50.0000 mg | Freq: Once | INTRAVENOUS | Status: DC
  Filled 2018-05-12: qty 1

## 2018-05-12 MED ORDER — VANCOMYCIN HCL 10 G IV SOLR
2000.0000 mg | Freq: Once | INTRAVENOUS | Status: AC
  Administered 2018-05-12: 2000 mg via INTRAVENOUS
  Filled 2018-05-12: qty 2000

## 2018-05-12 MED ORDER — VANCOMYCIN HCL 10 G IV SOLR
1500.0000 mg | Freq: Two times a day (BID) | INTRAVENOUS | Status: DC
  Administered 2018-05-13: 1500 mg via INTRAVENOUS
  Filled 2018-05-12 (×2): qty 1500

## 2018-05-12 MED ORDER — ACETAMINOPHEN 325 MG PO TABS
650.0000 mg | ORAL_TABLET | ORAL | Status: DC | PRN
  Administered 2018-05-12 – 2018-05-14 (×4): 650 mg via ORAL
  Filled 2018-05-12 (×4): qty 2

## 2018-05-12 NOTE — Progress Notes (Signed)
05/12/18  2253  Asked patient if it would be ok to turn off her heater since her temp is greater than 100. Patient refused to turn off heater stating she was cold. Tried to educate her but she was not trying to listens

## 2018-05-12 NOTE — Progress Notes (Signed)
CRITICAL VALUE ALERT  Critical Value: Hemaglobin =6.4 Date & Time Notied: 05-12-18 @ 0655  Provider Notified: Amion text being sent   Orders Received/Actions taken:- PENDING

## 2018-05-12 NOTE — Progress Notes (Signed)
05/12/18  1008 Paged midlevel [facility], patient's temp 102.7. Waiting on response.

## 2018-05-12 NOTE — Progress Notes (Addendum)
05/12/18  2238 Sunquest label did not print for flu swab. Patient refused to be swabbed for Flu.

## 2018-05-12 NOTE — Progress Notes (Signed)
Pt continues to be febrile , body aches and HA, treated now with Percocet , Amion text sent to report fever & inquire about Tylenol for fevers. Long discussion had with pt, her dtr ( pts brother present) re: personal heater use is discouraged as well as additional blankets until body temperature normalizes, explaining correlation of elevated body temperature and body aches / chills. Pt became upset stating her body is not normal & she always has a heater on her- requesting  to talk to her doctor and I explained how we communicate with providers via text messages and she will see a doctor in the morning.

## 2018-05-12 NOTE — Progress Notes (Addendum)
Rapid response notified for patient's MEWS score at 2230. At this time MEWS score is a two. At 2024 patient had spiked a fever of 102.41F oral and a BP of 89/68. Tylenol was given by primary RN and patient vital signs at 2220 are 100.21F oral and 137/95. Per primary RN, the NP had also ordered a bolus to be given, but the patient refused. Labs reviewed WBC is trending down, LA was not critically elevated on admission. Blood and urine cultures pending. Per primary RN, patient appears stable at this time. Rapid response RN did not go to patient bedside at this time, as patient's fever responded to PO Tylenol. RN to call rapid response for further needs.   Update: Primary RN called rapid response back at 2247 to inform me that the patient had a rectal temperature of 103.36F. I went to patient's bedside to assess. Patient was lying in bed. I noted that the patient had a heater in use at her bedside. I informed the patient that we would have to remove the heater due to her fever and she refused. I informed the patient of the fluid bolus that was ordered and the importance of receiving the fluid and how it would help reduce her fever and she refused administration of the fluid bolus. I informed the patient that we would need to place ice packs at her axilla and groin sites to help reduce her fever and she refused. I informed the primary RN of my discussion with the patient. Patient breathing was not labored, felt warm and dry to touch, heart rate sustaining 90s-115 bpm, and patient states she has pain all over. RN to consult rapid response for further needs.   MEWS Guidelines - (patients age 63 and over)  Red - At High Risk for Deterioration Yellow - At risk for Deterioration  1. Go to room and assess patient 2. Validate data. Is this patient's baseline? If data confirmed: 3. Is this an acute change? 4. Administer prn meds/treatments as ordered. 5. Note Sepsis score 6. Review goals of care 7. Notify RRT  nurse, Camera operator and Provider. 8. Ask Provider to come to bedside.  9. Document patient condition/interventions/response. 10. Increase frequency of vital signs and focused assessments to at least q15 minutes x 4, then q30 minutes x2. - If stable, then q1h x3, then q4h x3 and then q8h or dept. routine. - If unstable, contact Provider, RRT nurse and prepare for possible transfer. 11. Add entry in progress notes using the smart phrase ".MEWPilot". 1. Go to room and assess patient 2. Validate data. Is this patient's baseline? If data confirmed: 3. Is this an acute change? 4. Administer prn meds/treatments as ordered? 5. Note Sepsis score 6. Review goals of care 7. Call RRT nurse for initial change and as needed. 8. Sports coach and Provider 9. Document patient condition/interventions/response. 10. Increase frequency of vital signs and focused assessments to at least q2h x2. - If stable, then q4h x4 and then q8h or dept. routine. - If unstable, contact Provider and RRT nurse and prepare for possible transfer. 11. Add entry in progress notes using the smart phrase ".MEWPilot".  Green - Likely stable Lavender - Comfort Care Only  1. Continue routine/ordered monitoring.  2. Review goals of care. 1. Continue routine/ordered monitoring. 2. Review goals of care.

## 2018-05-12 NOTE — Progress Notes (Signed)
Pharmacy Antibiotic Note  Tracey Hall is a 37 y.o. female admitted on 05/11/2018 with sepsis secondary to UTI.  Pharmacy has been consulted for vancomycin dosing.  Plan: Vancomycin 2gm IV x 1 then 1500mg  q12h (AUC 511.6, Scr 0.5) Ceftriaxone 2gm IV q24h (per MD) Follow renal function, cultures and clinical course Weight: 227 lb 11.8 oz (103.3 kg)  Temp (24hrs), Avg:102.1 F (38.9 C), Min:99.1 F (37.3 C), Max:105.5 F (40.8 C)  Recent Labs  Lab 05/11/18 1220 05/11/18 1248 05/12/18 0611  WBC 17.6*  --  13.5*  CREATININE 0.65  --  0.50  LATICACIDVEN  --  1.21  --     Estimated Creatinine Clearance: 114.8 mL/min (by C-G formula based on SCr of 0.5 mg/dL).    Allergies  Allergen Reactions  . Latex Rash    Antimicrobials this admission: 11/20 CTX >> 11/21 vanc >> Dose adjustments this admission:   Microbiology results: 11/20 BCx: GPC, staph species 11/21 UCx:   Thank you for allowing pharmacy to be a part of this patient's care.  Dolly Rias RPh 05/12/2018, 11:33 AM Pager [phone]

## 2018-05-12 NOTE — Progress Notes (Signed)
05/12/18  2207 Paged midlevel [facility] to let them know patient refusing order for NS bolus.
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
## 2018-05-13 LAB — CBC WITH DIFFERENTIAL/PLATELET
Abs Immature Granulocytes: 0.08 10*3/uL — ABNORMAL HIGH (ref 0.00–0.07)
BASOS PCT: 0 %
Basophils Absolute: 0 10*3/uL (ref 0.0–0.1)
EOS ABS: 0.1 10*3/uL (ref 0.0–0.5)
EOS PCT: 1 %
HCT: 28.9 % — ABNORMAL LOW (ref 36.0–46.0)
HEMOGLOBIN: 8.9 g/dL — AB (ref 12.0–15.0)
Immature Granulocytes: 1 %
LYMPHS PCT: 19 %
Lymphs Abs: 2.4 10*3/uL (ref 0.7–4.0)
MCH: 23 pg — AB (ref 26.0–34.0)
MCHC: 30.8 g/dL (ref 30.0–36.0)
MCV: 74.7 fL — ABNORMAL LOW (ref 80.0–100.0)
Monocytes Absolute: 1.1 10*3/uL — ABNORMAL HIGH (ref 0.1–1.0)
Monocytes Relative: 9 %
Neutro Abs: 9.4 10*3/uL — ABNORMAL HIGH (ref 1.7–7.7)
Neutrophils Relative %: 70 %
Platelets: 394 10*3/uL (ref 150–400)
RBC: 3.87 MIL/uL (ref 3.87–5.11)
RDW: 20 % — ABNORMAL HIGH (ref 11.5–15.5)
WBC: 13.2 10*3/uL — AB (ref 4.0–10.5)
nRBC: 0 % (ref 0.0–0.2)

## 2018-05-13 LAB — TYPE AND SCREEN
ABO/RH(D): O POS
ANTIBODY SCREEN: NEGATIVE
UNIT DIVISION: 0
Unit division: 0

## 2018-05-13 LAB — BASIC METABOLIC PANEL
Anion gap: 8 (ref 5–15)
BUN: 5 mg/dL — AB (ref 6–20)
CHLORIDE: 108 mmol/L (ref 98–111)
CO2: 23 mmol/L (ref 22–32)
CREATININE: 0.55 mg/dL (ref 0.44–1.00)
Calcium: 7.9 mg/dL — ABNORMAL LOW (ref 8.9–10.3)
GFR calc Af Amer: >60 mL/min (ref >60)
GFR calc non Af Amer: >60 mL/min (ref >60)
Glucose, Bld: 100 mg/dL — ABNORMAL HIGH (ref 70–99)
Potassium: 3.5 mmol/L (ref 3.5–5.1)
SODIUM: 139 mmol/L (ref 135–145)

## 2018-05-13 LAB — BPAM RBC
Blood Product Expiration Date: 201912202359
Blood Product Expiration Date: 201912202359
ISSUE DATE / TIME: 201911211229
ISSUE DATE / TIME: 201911211534
UNIT TYPE AND RH: 5100
Unit Type and Rh: 5100

## 2018-05-13 LAB — URINE CULTURE

## 2018-05-13 MED ORDER — POTASSIUM CHLORIDE CRYS ER 20 MEQ PO TBCR
40.0000 meq | EXTENDED_RELEASE_TABLET | Freq: Once | ORAL | Status: AC
  Administered 2018-05-13: 40 meq via ORAL
  Filled 2018-05-13: qty 2

## 2018-05-13 MED ORDER — IBUPROFEN 200 MG PO TABS
400.0000 mg | ORAL_TABLET | Freq: Once | ORAL | Status: AC
  Administered 2018-05-13: 400 mg via ORAL
  Filled 2018-05-13: qty 2
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
## 2018-05-13 NOTE — Progress Notes (Signed)
PHARMACY - PHYSICIAN COMMUNICATION CRITICAL VALUE ALERT - BLOOD CULTURE IDENTIFICATION (BCID)  Tracey Hall is an 37 y.o. female who presented to [facility] on 05/11/2018  Assessment:  Microbiology called to update blood culture from 11/20, now also a gram positive rod  Name of physician (or Provider) Contacted: text page sent to Dr Tawanna Solo  Current antibiotics: ceftriaxone for UTI  Changes to prescribed antibiotics recommended:  Coag negative staph and Gram positive rod in same set represent likely contaminant  Results for orders placed or performed during the hospital encounter of 05/11/18  Blood Culture ID Panel (Reflexed) (Collected: 05/11/2018 12:20 PM)  Result Value Ref Range   Enterococcus species NOT DETECTED NOT DETECTED   Listeria monocytogenes NOT DETECTED NOT DETECTED   Staphylococcus species DETECTED (A) NOT DETECTED   Staphylococcus aureus (BCID) NOT DETECTED NOT DETECTED   Methicillin resistance NOT DETECTED NOT DETECTED   Streptococcus species NOT DETECTED NOT DETECTED   Streptococcus agalactiae NOT DETECTED NOT DETECTED   Streptococcus pneumoniae NOT DETECTED NOT DETECTED   Streptococcus pyogenes NOT DETECTED NOT DETECTED   Acinetobacter baumannii NOT DETECTED NOT DETECTED   Enterobacteriaceae species NOT DETECTED NOT DETECTED   Enterobacter cloacae complex NOT DETECTED NOT DETECTED   Escherichia coli NOT DETECTED NOT DETECTED   Klebsiella oxytoca NOT DETECTED NOT DETECTED   Klebsiella pneumoniae NOT DETECTED NOT DETECTED   Proteus species NOT DETECTED NOT DETECTED   Serratia marcescens NOT DETECTED NOT DETECTED   Haemophilus influenzae NOT DETECTED NOT DETECTED   Neisseria meningitidis NOT DETECTED NOT DETECTED   Pseudomonas aeruginosa NOT DETECTED NOT DETECTED   Candida albicans NOT DETECTED NOT DETECTED   Candida glabrata NOT DETECTED NOT DETECTED   Candida krusei NOT DETECTED NOT DETECTED   Candida parapsilosis NOT DETECTED NOT DETECTED   Candida  tropicalis NOT DETECTED NOT DETECTED    Doreene Eland, PharmD, BCPS.   Work Cell: [phone] 05/13/2018 3:31 PM

## 2018-05-14 ENCOUNTER — Other Ambulatory Visit: Payer: Self-pay

## 2018-05-14 LAB — CBC WITH DIFFERENTIAL/PLATELET
Abs Immature Granulocytes: 0.06 10*3/uL (ref 0.00–0.07)
BASOS ABS: 0 10*3/uL (ref 0.0–0.1)
Basophils Relative: 0 %
EOS ABS: 0.3 10*3/uL (ref 0.0–0.5)
EOS PCT: 3 %
HEMATOCRIT: 28.4 % — AB (ref 36.0–46.0)
HEMOGLOBIN: 8.7 g/dL — AB (ref 12.0–15.0)
Immature Granulocytes: 1 %
LYMPHS ABS: 2.2 10*3/uL (ref 0.7–4.0)
LYMPHS PCT: 20 %
MCH: 22.9 pg — AB (ref 26.0–34.0)
MCHC: 30.6 g/dL (ref 30.0–36.0)
MCV: 74.7 fL — AB (ref 80.0–100.0)
MONO ABS: 0.8 10*3/uL (ref 0.1–1.0)
Monocytes Relative: 7 %
NRBC: 0 % (ref 0.0–0.2)
Neutro Abs: 7.6 10*3/uL (ref 1.7–7.7)
Neutrophils Relative %: 69 %
Platelets: 402 10*3/uL — ABNORMAL HIGH (ref 150–400)
RBC: 3.8 MIL/uL — ABNORMAL LOW (ref 3.87–5.11)
RDW: 20.7 % — ABNORMAL HIGH (ref 11.5–15.5)
WBC: 10.9 10*3/uL — ABNORMAL HIGH (ref 4.0–10.5)

## 2018-05-14 LAB — BASIC METABOLIC PANEL
Anion gap: 7 (ref 5–15)
CALCIUM: 8.2 mg/dL — AB (ref 8.9–10.3)
CHLORIDE: 102 mmol/L (ref 98–111)
CO2: 26 mmol/L (ref 22–32)
CREATININE: 0.46 mg/dL (ref 0.44–1.00)
GFR calc non Af Amer: >60 mL/min (ref >60)
GLUCOSE: 91 mg/dL (ref 70–99)
Potassium: 3.7 mmol/L (ref 3.5–5.1)
Sodium: 135 mmol/L (ref 135–145)

## 2018-05-14 MED ORDER — FERROUS SULFATE 325 (65 FE) MG PO TABS: 325.0000 mg | ORAL_TABLET | Freq: Two times a day (BID) | ORAL | 0 refills | Status: DC

## 2018-05-14 MED ORDER — PANTOPRAZOLE SODIUM 40 MG PO TBEC: 40.0000 mg | DELAYED_RELEASE_TABLET | Freq: Two times a day (BID) | ORAL | 0 refills | Status: DC

## 2018-05-14 MED ORDER — CEFDINIR 300 MG PO CAPS: 300.0000 mg | ORAL_CAPSULE | Freq: Two times a day (BID) | ORAL | 0 refills | Status: DC

## 2018-05-14 MED ORDER — TRAMADOL HCL 50 MG PO TABS: 50.0000 mg | ORAL_TABLET | Freq: Four times a day (QID) | ORAL | 0 refills | Status: AC | PRN

## 2018-05-14 NOTE — Progress Notes (Signed)
Late entry: 0700 During shift report patient found in bed with portable heater on blowing on her face.  Per shift report from [place], night RN. Patient keeps heater blowing on her face.

## 2018-05-14 NOTE — Progress Notes (Signed)
Discharge instructions and medications discussed with patient.  AVS and prescription given to patient.  All questions answered.  
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
## 2018-05-15 LAB — CULTURE, BLOOD (ROUTINE X 2): Special Requests: ADEQUATE

## 2018-05-17 LAB — CULTURE, BLOOD (ROUTINE X 2)
Culture: NO GROWTH
Special Requests: ADEQUATE

## 2018-07-05 DIAGNOSIS — F4320 Adjustment disorder, unspecified: Secondary | ICD-10-CM | POA: Diagnosis not present

## 2018-09-29 ENCOUNTER — Encounter: Payer: Self-pay | Admitting: Family Medicine

## 2018-10-07 ENCOUNTER — Telehealth: Payer: Self-pay | Admitting: Family Medicine

## 2018-10-07 ENCOUNTER — Telehealth (INDEPENDENT_AMBULATORY_CARE_PROVIDER_SITE_OTHER): Payer: Medicare Other | Admitting: Family Medicine

## 2018-10-07 ENCOUNTER — Ambulatory Visit: Payer: Medicare Other

## 2018-10-07 ENCOUNTER — Other Ambulatory Visit: Payer: Self-pay

## 2018-10-07 DIAGNOSIS — T148XXA Other injury of unspecified body region, initial encounter: Secondary | ICD-10-CM

## 2018-10-07 DIAGNOSIS — R399 Unspecified symptoms and signs involving the genitourinary system: Secondary | ICD-10-CM

## 2018-10-07 DIAGNOSIS — R42 Dizziness and giddiness: Secondary | ICD-10-CM

## 2018-10-07 NOTE — Progress Notes (Signed)
[place] Telemedicine Visit  Patient consented to have virtual visit. Method of visit:Video via DoxyMe  Encounter participants: Patient: Tracey Hall - located at Home Provider: Andrena Mews - located: Offsite work Others (if applicable):Darryl Gaffer Complaint: Urine odor, Blister, Dizzy spell  HPI:  UTI: C/O strong urine odor for a few days associated with reduced appetite and blistering of her left leg, per patient, this is the typical presentation of her UTI. Her last UTI was last year, Nov.  She denies a change in urine color, no blood in urine, no burning with urination. No vomiting but feels nauseous, no belly pain, no fever.   Blister: C/O blistering of her left leg, which started small four days ago but has gradually increased in size. The blister is very painful and a bit red. She had similar episodes in the past, for which she was seen by a dermatologist who diagnosed her with very bad eczema and bullous pemphigoid.  Dizzy spelling: C/O dizziness, which started three days ago. She endorsed associated blurry vision with the dizziness on and off, neck pain, headache, and at times she feels sweaty. She denies previous episodes of dizziness. Her symptom is worsened when she sits in her wheelchair. Yesterday she had just a headache without dizziness. Tilting her head back with rest makes her feel better as well as eating and drinking, although her appetite is low. She seels better today with the dizziness. She has lower back pain and chronic shoulder pain.  ROS: per HPI  Pertinent PMHx: Problem list reviewed  Exam:  Gen: Obese Respiratory: No distress HEENT: EOMI,short neck Ext: Large stage 1-2 ulcer of her left LL with surrounding erythema. I was unable to measure via video call.  Assessment/Plan: Given her constellation of symptoms, I recommended clinic visits for:  1. Obtain UA and urine culture and treat empirically for UTI while  awaiting culture result.  2. Evaluate left LL ulcer for infection and treat appropriately. Please take picture for documentation purpose and measure the ulcer size.  3. Check BP and obtain labs for dizzy spells.  Patient agreed to come in today to be seen. I made appointment for this afternoon on ATC.  Time spent during visit with patient: 20 minutes

## 2018-10-07 NOTE — Telephone Encounter (Signed)
RN team   Not sure why this patient's appointment was moved from today. Please contact patient and advise her to go to the ED if symptoms worsen. I need her to get checked today and that was why I made an appointment for today. Thanks.

## 2018-10-07 NOTE — Telephone Encounter (Signed)
Called patient. She isn't able to come in today, she did not elaborate. ED precautions given. I strongly encouraged her to keep her apt for Monday.

## 2018-10-10 ENCOUNTER — Other Ambulatory Visit: Payer: Self-pay

## 2018-10-10 ENCOUNTER — Ambulatory Visit (INDEPENDENT_AMBULATORY_CARE_PROVIDER_SITE_OTHER): Payer: Medicare Other | Admitting: Family Medicine

## 2018-10-10 DIAGNOSIS — N390 Urinary tract infection, site not specified: Secondary | ICD-10-CM

## 2018-10-10 DIAGNOSIS — R300 Dysuria: Secondary | ICD-10-CM

## 2018-10-10 DIAGNOSIS — G894 Chronic pain syndrome: Secondary | ICD-10-CM | POA: Diagnosis not present

## 2018-10-10 DIAGNOSIS — L139 Bullous disorder, unspecified: Secondary | ICD-10-CM

## 2018-10-10 MED ORDER — CEPHALEXIN 500 MG PO CAPS: 500.0000 mg | ORAL_CAPSULE | Freq: Four times a day (QID) | ORAL | 0 refills | Status: AC

## 2018-10-10 NOTE — Progress Notes (Signed)
Unna boot place on LT lower leg. Ulcer on outer portion of lower leg. Pt to return Friday for replacement per Dr. Criss Rosales orders. Salvatore Marvel, CMA

## 2018-10-10 NOTE — Patient Instructions (Signed)
It was a pleasure to see you today! Thank you for choosing [facility] for your primary care. Tracey Hall was seen for UTI symptoms and a leg wound. Come back to the clinic if if you need anything else, go to the emergency department if you notice significant fever or streaking from the wound..    Today we ordered an Unna boot to help with the wound on your leg.  Were going to place a referral to wound care to try and help you out.  We also ordered a course of Keflex which is an appropriate antibiotic for your recurring UTI symptoms.  For a complaint of chronic pain, since Tylenol has not been keeping up and you said that baclofen and gabapentin did not work.  Your gastric ulcers make ibuprofen not an appropriate choice, so we are referring to pain medicine.  We want to be clear that we are not promising a narcotic pain regimen and they may think that there might be some procedural intervention that is more appropriate.   Please bring all your medications to every doctors visit   Sign up for My Chart to have easy access to your labs results, and communication with your Primary care physician.     Please check-out at the front desk before leaving the clinic.     Best,  Dr. Sherene Sires FAMILY MEDICINE RESIDENT - PGY2 10/10/2018 3:07 PM
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
## 2018-10-14 ENCOUNTER — Ambulatory Visit: Payer: Medicare Other

## 2018-10-14 ENCOUNTER — Other Ambulatory Visit: Payer: Self-pay

## 2018-10-14 DIAGNOSIS — L139 Bullous disorder, unspecified: Secondary | ICD-10-CM

## 2018-10-14 DIAGNOSIS — G894 Chronic pain syndrome: Secondary | ICD-10-CM | POA: Insufficient documentation

## 2018-10-14 MED ORDER — CYCLOBENZAPRINE HCL 5 MG PO TABS: 5.0000 mg | ORAL_TABLET | Freq: Three times a day (TID) | ORAL | 1 refills | Status: DC | PRN

## 2018-10-14 NOTE — Assessment & Plan Note (Signed)
Dysuria consistent with prior UTIs, says she can't go right now in the office as she already went.  No discharge/lesions complaints  Ceflex prescribed and return precatuions given

## 2018-10-14 NOTE — Progress Notes (Addendum)
Pt presents in nurse clinic for unna boot removal for left lower extremity wound. Unna boot was removed and "looked" better and "smelled" better than it did on Monday, per patients boyfriend. Pt stated it weeped a lot throughout the week and was fearful the wound has gotten worse. I precepted with Barry who saw patient with me and noted no infection. Per Chambliss the patient is to let the wound air out at night and wrap with ace bandage during the day. Unfortunately we have no ace bandages. So, I placed telfa on wound and wrapped with coban. I gave some coban, telfa, and tape to patient. They will reuse coban and wrap leg. Pt scheduled for next Friday, 5/1 to return to nurse clinic for wound check.   Of note, patient has not heard from wound care, referral was placed on Monday. I will reach out to our referral coordinator to check status. Will route to Ch[facility]iss.   Patient asked for something to help with muscle spasms that are worse with her uti.  Has tried baclofen and gabapentin and cyclobenzaprine in past. Decided to try cyclobenzaprine again

## 2018-10-14 NOTE — Assessment & Plan Note (Signed)
Patient with long list of multiple meds that won't work, wants "something stronger"  We discussed risks and potential innappropriateness of chronic opiods for chronic pain, patient accepts referral to pain med

## 2018-10-14 NOTE — Addendum Note (Signed)
Addended by: Talbert Cage L on: 10/14/2018 03:49 PM   Modules accepted: Orders

## 2018-10-14 NOTE — Assessment & Plan Note (Signed)
Lower left lateral wound ~3-4" diamter withou surrounding cellulitis.  unaboot applied, referal to wound care, and f/u ini 1wk for boot change.  Patient instructed to stop applying random creams at home (steroid?)

## 2018-10-21 ENCOUNTER — Ambulatory Visit: Payer: Medicare Other

## 2018-11-02 ENCOUNTER — Encounter: Payer: Self-pay | Admitting: Family Medicine

## 2019-02-17 ENCOUNTER — Emergency Department (HOSPITAL_COMMUNITY): Payer: Medicare Other

## 2019-02-17 ENCOUNTER — Inpatient Hospital Stay (HOSPITAL_COMMUNITY)
Admission: EM | Admit: 2019-02-17 | Discharge: 2019-02-23 | DRG: 871 | Disposition: A | Discharge status: Rehabilitation Facility | Payer: Medicare Other | Attending: Internal Medicine | Admitting: Internal Medicine

## 2019-02-17 ENCOUNTER — Other Ambulatory Visit: Payer: Self-pay

## 2019-02-17 ENCOUNTER — Encounter (HOSPITAL_COMMUNITY): Payer: Self-pay | Admitting: Emergency Medicine

## 2019-02-17 DIAGNOSIS — D638 Anemia in other chronic diseases classified elsewhere: Secondary | ICD-10-CM | POA: Diagnosis present

## 2019-02-17 DIAGNOSIS — Z6832 Body mass index (BMI) 32.0-32.9, adult: Secondary | ICD-10-CM

## 2019-02-17 DIAGNOSIS — D649 Anemia, unspecified: Secondary | ICD-10-CM | POA: Diagnosis not present

## 2019-02-17 DIAGNOSIS — Z20828 Contact with and (suspected) exposure to other viral communicable diseases: Secondary | ICD-10-CM | POA: Diagnosis present

## 2019-02-17 DIAGNOSIS — E872 Acidosis: Secondary | ICD-10-CM | POA: Diagnosis present

## 2019-02-17 DIAGNOSIS — G8250 Quadriplegia, unspecified: Secondary | ICD-10-CM | POA: Diagnosis not present

## 2019-02-17 DIAGNOSIS — N319 Neuromuscular dysfunction of bladder, unspecified: Secondary | ICD-10-CM | POA: Diagnosis present

## 2019-02-17 DIAGNOSIS — R627 Adult failure to thrive: Secondary | ICD-10-CM | POA: Diagnosis present

## 2019-02-17 DIAGNOSIS — D509 Iron deficiency anemia, unspecified: Secondary | ICD-10-CM | POA: Diagnosis present

## 2019-02-17 DIAGNOSIS — G894 Chronic pain syndrome: Secondary | ICD-10-CM | POA: Diagnosis present

## 2019-02-17 DIAGNOSIS — Z79899 Other long term (current) drug therapy: Secondary | ICD-10-CM

## 2019-02-17 DIAGNOSIS — N3001 Acute cystitis with hematuria: Secondary | ICD-10-CM

## 2019-02-17 DIAGNOSIS — E876 Hypokalemia: Secondary | ICD-10-CM | POA: Diagnosis not present

## 2019-02-17 DIAGNOSIS — K5909 Other constipation: Secondary | ICD-10-CM | POA: Diagnosis present

## 2019-02-17 DIAGNOSIS — N136 Pyonephrosis: Secondary | ICD-10-CM | POA: Diagnosis present

## 2019-02-17 DIAGNOSIS — A4151 Sepsis due to Escherichia coli [E. coli]: Secondary | ICD-10-CM | POA: Diagnosis not present

## 2019-02-17 DIAGNOSIS — F339 Major depressive disorder, recurrent, unspecified: Secondary | ICD-10-CM | POA: Diagnosis present

## 2019-02-17 DIAGNOSIS — N390 Urinary tract infection, site not specified: Secondary | ICD-10-CM | POA: Diagnosis present

## 2019-02-17 DIAGNOSIS — Z87440 Personal history of urinary (tract) infections: Secondary | ICD-10-CM

## 2019-02-17 DIAGNOSIS — R739 Hyperglycemia, unspecified: Secondary | ICD-10-CM | POA: Diagnosis present

## 2019-02-17 DIAGNOSIS — E871 Hypo-osmolality and hyponatremia: Secondary | ICD-10-CM | POA: Diagnosis present

## 2019-02-17 DIAGNOSIS — F411 Generalized anxiety disorder: Secondary | ICD-10-CM | POA: Diagnosis present

## 2019-02-17 DIAGNOSIS — A419 Sepsis, unspecified organism: Secondary | ICD-10-CM | POA: Diagnosis present

## 2019-02-17 DIAGNOSIS — E669 Obesity, unspecified: Secondary | ICD-10-CM | POA: Diagnosis present

## 2019-02-17 DIAGNOSIS — R6520 Severe sepsis without septic shock: Secondary | ICD-10-CM | POA: Diagnosis present

## 2019-02-17 DIAGNOSIS — N179 Acute kidney failure, unspecified: Secondary | ICD-10-CM | POA: Diagnosis not present

## 2019-02-17 DIAGNOSIS — N1330 Unspecified hydronephrosis: Secondary | ICD-10-CM

## 2019-02-17 DIAGNOSIS — N760 Acute vaginitis: Secondary | ICD-10-CM | POA: Diagnosis present

## 2019-02-17 DIAGNOSIS — K592 Neurogenic bowel, not elsewhere classified: Secondary | ICD-10-CM | POA: Diagnosis present

## 2019-02-17 DIAGNOSIS — N182 Chronic kidney disease, stage 2 (mild): Secondary | ICD-10-CM | POA: Diagnosis present

## 2019-02-17 DIAGNOSIS — R509 Fever, unspecified: Secondary | ICD-10-CM | POA: Diagnosis not present

## 2019-02-17 DIAGNOSIS — A599 Trichomoniasis, unspecified: Secondary | ICD-10-CM | POA: Diagnosis present

## 2019-02-17 HISTORY — DX: Adjustment disorder, unspecified: F43.20

## 2019-02-17 HISTORY — DX: Bullous disorder, unspecified: L13.9

## 2019-02-17 LAB — COMPREHENSIVE METABOLIC PANEL
ALT: 14 U/L (ref 0–44)
AST: 19 U/L (ref 15–41)
Albumin: 3.3 g/dL — ABNORMAL LOW (ref 3.5–5.0)
Alkaline Phosphatase: 65 U/L (ref 38–126)
Anion gap: 12 (ref 5–15)
BUN: 19 mg/dL (ref 6–20)
CO2: 19 mmol/L — ABNORMAL LOW (ref 22–32)
Calcium: 8.3 mg/dL — ABNORMAL LOW (ref 8.9–10.3)
Chloride: 102 mmol/L (ref 98–111)
Creatinine, Ser: 1.38 mg/dL — ABNORMAL HIGH (ref 0.44–1.00)
GFR calc Af Amer: 56 mL/min — ABNORMAL LOW (ref >60)
GFR calc non Af Amer: 49 mL/min — ABNORMAL LOW (ref >60)
Glucose, Bld: 120 mg/dL — ABNORMAL HIGH (ref 70–99)
Potassium: 2.8 mmol/L — ABNORMAL LOW (ref 3.5–5.1)
Sodium: 133 mmol/L — ABNORMAL LOW (ref 135–145)
Total Bilirubin: 0.2 mg/dL — ABNORMAL LOW (ref 0.3–1.2)
Total Protein: 7.1 g/dL (ref 6.5–8.1)

## 2019-02-17 LAB — CBC WITH DIFFERENTIAL/PLATELET
Abs Immature Granulocytes: 0.15 10*3/uL — ABNORMAL HIGH (ref 0.00–0.07)
Basophils Absolute: 0 10*3/uL (ref 0.0–0.1)
Basophils Relative: 0 %
Eosinophils Absolute: 0 10*3/uL (ref 0.0–0.5)
Eosinophils Relative: 0 %
HCT: 21.3 % — ABNORMAL LOW (ref 36.0–46.0)
Hemoglobin: 6 g/dL — CL (ref 12.0–15.0)
Immature Granulocytes: 1 %
Lymphocytes Relative: 5 %
Lymphs Abs: 1.2 10*3/uL (ref 0.7–4.0)
MCH: 19.2 pg — ABNORMAL LOW (ref 26.0–34.0)
MCHC: 28.2 g/dL — ABNORMAL LOW (ref 30.0–36.0)
MCV: 68.1 fL — ABNORMAL LOW (ref 80.0–100.0)
Monocytes Absolute: 1.3 10*3/uL — ABNORMAL HIGH (ref 0.1–1.0)
Monocytes Relative: 6 %
Neutro Abs: 19.5 10*3/uL — ABNORMAL HIGH (ref 1.7–7.7)
Neutrophils Relative %: 88 %
Platelets: 515 10*3/uL — ABNORMAL HIGH (ref 150–400)
RBC: 3.13 MIL/uL — ABNORMAL LOW (ref 3.87–5.11)
RDW: 18.7 % — ABNORMAL HIGH (ref 11.5–15.5)
WBC: 22.2 10*3/uL — ABNORMAL HIGH (ref 4.0–10.5)
nRBC: 0 % (ref 0.0–0.2)

## 2019-02-17 LAB — PROTIME-INR
INR: 1.3 — ABNORMAL HIGH (ref 0.8–1.2)
Prothrombin Time: 16.2 seconds — ABNORMAL HIGH (ref 11.4–15.2)

## 2019-02-17 LAB — LACTIC ACID, PLASMA: Lactic Acid, Venous: 3.8 mmol/L (ref 0.5–1.9)

## 2019-02-17 LAB — I-STAT BETA HCG BLOOD, ED (MC, WL, AP ONLY): I-stat hCG, quantitative: <5 m[IU]/mL (ref <5)

## 2019-02-17 MED ORDER — ACETAMINOPHEN 325 MG PO TABS
650.0000 mg | ORAL_TABLET | Freq: Once | ORAL | Status: AC
  Administered 2019-02-17: 650 mg via ORAL
  Filled 2019-02-17: qty 2

## 2019-02-17 MED ORDER — SODIUM CHLORIDE 0.9 % IV BOLUS (SEPSIS)
2000.0000 mL | Freq: Once | INTRAVENOUS | Status: AC
  Administered 2019-02-17: 2000 mL via INTRAVENOUS

## 2019-02-17 MED ORDER — FENTANYL CITRATE (PF) 100 MCG/2ML IJ SOLN
50.0000 ug | Freq: Once | INTRAMUSCULAR | Status: AC
  Administered 2019-02-17: 50 ug via INTRAVENOUS
  Filled 2019-02-17: qty 2

## 2019-02-17 MED ORDER — SODIUM CHLORIDE 0.9% IV SOLUTION
Freq: Once | INTRAVENOUS | Status: AC
  Administered 2019-02-18: 1 mL via INTRAVENOUS

## 2019-02-17 MED ORDER — SODIUM CHLORIDE 0.9 % IV SOLN
1.0000 g | INTRAVENOUS | Status: DC
  Administered 2019-02-17: 22:00:00 1 g via INTRAVENOUS
  Filled 2019-02-17: qty 10

## 2019-02-17 MED ORDER — SODIUM CHLORIDE 0.9 % IV BOLUS
1000.0000 mL | Freq: Once | INTRAVENOUS | Status: AC
  Administered 2019-02-18: 01:00:00 1000 mL via INTRAVENOUS

## 2019-02-17 NOTE — ED Notes (Signed)
CRITICAL VALUE STICKER  CRITICAL VALUE: Lactic 3.8, Hgb 6.0  RECEIVER (on-site recipient of call): Jake  DATE & TIME NOTIFIED: 8/28 1047p  MESSENGER (representative from lab): Merrilyn Puma  PROVIDER NOTIFIED: Brittni H, PA  TIME OF NOTIFICATION: [id number]  RESPONSE: see orders
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
## 2019-02-17 NOTE — ED Provider Notes (Signed)
  Face-to-face evaluation   History: Patient presents for evaluation of generalized achiness with a sensation of having a urinary tract infection.  She is quadriparetic.  She states she has frequent UTIs with E. coli  Physical exam: Alert and cooperative.  Skin warm to touch.  Abdomen is distended but without mass or discomfort on palpation.  Medical screening examination/treatment/procedure(s) were conducted as a shared visit with non-physician practitioner(s) and myself.  I personally evaluated the patient during the encounter    Daleen Bo, MD 02/18/19 1456

## 2019-02-17 NOTE — ED Triage Notes (Signed)
[facility] transported pt from home and reports the following:  Pt reports body aches, fever, nausea, dizzy. Pt said that 3-4 times a year she gets a really bad blood or kidney infection due to incontinence.   Modified quadriplegic. 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 Can move arms. Can't move fingers.  Pupils equally reactive but slowly. Pt seems unable to focus.   Pt has taken Tylenol PM for 3 days.

## 2019-02-18 ENCOUNTER — Inpatient Hospital Stay (HOSPITAL_COMMUNITY): Payer: Medicare Other

## 2019-02-18 DIAGNOSIS — A419 Sepsis, unspecified organism: Secondary | ICD-10-CM | POA: Diagnosis not present

## 2019-02-18 DIAGNOSIS — E876 Hypokalemia: Secondary | ICD-10-CM | POA: Diagnosis not present

## 2019-02-18 DIAGNOSIS — D649 Anemia, unspecified: Secondary | ICD-10-CM | POA: Diagnosis not present

## 2019-02-18 DIAGNOSIS — A4151 Sepsis due to Escherichia coli [E. coli]: Secondary | ICD-10-CM | POA: Diagnosis not present

## 2019-02-18 DIAGNOSIS — R5383 Other fatigue: Secondary | ICD-10-CM | POA: Diagnosis not present

## 2019-02-18 DIAGNOSIS — M7989 Other specified soft tissue disorders: Secondary | ICD-10-CM | POA: Diagnosis not present

## 2019-02-18 DIAGNOSIS — N319 Neuromuscular dysfunction of bladder, unspecified: Secondary | ICD-10-CM | POA: Diagnosis not present

## 2019-02-18 DIAGNOSIS — R6520 Severe sepsis without septic shock: Secondary | ICD-10-CM | POA: Diagnosis not present

## 2019-02-18 DIAGNOSIS — E872 Acidosis: Secondary | ICD-10-CM | POA: Diagnosis not present

## 2019-02-18 DIAGNOSIS — G8250 Quadriplegia, unspecified: Secondary | ICD-10-CM | POA: Diagnosis not present

## 2019-02-18 DIAGNOSIS — R739 Hyperglycemia, unspecified: Secondary | ICD-10-CM | POA: Diagnosis present

## 2019-02-18 DIAGNOSIS — S14106S Unspecified injury at C6 level of cervical spinal cord, sequela: Secondary | ICD-10-CM | POA: Diagnosis not present

## 2019-02-18 DIAGNOSIS — N10 Acute tubulo-interstitial nephritis: Secondary | ICD-10-CM | POA: Diagnosis not present

## 2019-02-18 DIAGNOSIS — N390 Urinary tract infection, site not specified: Secondary | ICD-10-CM | POA: Diagnosis not present

## 2019-02-18 DIAGNOSIS — N1330 Unspecified hydronephrosis: Secondary | ICD-10-CM | POA: Diagnosis not present

## 2019-02-18 DIAGNOSIS — N136 Pyonephrosis: Secondary | ICD-10-CM | POA: Diagnosis not present

## 2019-02-18 DIAGNOSIS — Z20828 Contact with and (suspected) exposure to other viral communicable diseases: Secondary | ICD-10-CM | POA: Diagnosis not present

## 2019-02-18 DIAGNOSIS — D509 Iron deficiency anemia, unspecified: Secondary | ICD-10-CM | POA: Diagnosis present

## 2019-02-18 DIAGNOSIS — K5900 Constipation, unspecified: Secondary | ICD-10-CM | POA: Diagnosis not present

## 2019-02-18 DIAGNOSIS — D638 Anemia in other chronic diseases classified elsewhere: Secondary | ICD-10-CM | POA: Diagnosis not present

## 2019-02-18 DIAGNOSIS — Z79899 Other long term (current) drug therapy: Secondary | ICD-10-CM | POA: Diagnosis not present

## 2019-02-18 DIAGNOSIS — N182 Chronic kidney disease, stage 2 (mild): Secondary | ICD-10-CM | POA: Diagnosis present

## 2019-02-18 DIAGNOSIS — M62838 Other muscle spasm: Secondary | ICD-10-CM | POA: Diagnosis not present

## 2019-02-18 DIAGNOSIS — K592 Neurogenic bowel, not elsewhere classified: Secondary | ICD-10-CM | POA: Diagnosis not present

## 2019-02-18 DIAGNOSIS — E871 Hypo-osmolality and hyponatremia: Secondary | ICD-10-CM | POA: Diagnosis not present

## 2019-02-18 DIAGNOSIS — E6609 Other obesity due to excess calories: Secondary | ICD-10-CM | POA: Diagnosis not present

## 2019-02-18 DIAGNOSIS — A599 Trichomoniasis, unspecified: Secondary | ICD-10-CM | POA: Diagnosis present

## 2019-02-18 DIAGNOSIS — F411 Generalized anxiety disorder: Secondary | ICD-10-CM | POA: Diagnosis present

## 2019-02-18 DIAGNOSIS — F339 Major depressive disorder, recurrent, unspecified: Secondary | ICD-10-CM | POA: Diagnosis not present

## 2019-02-18 DIAGNOSIS — N179 Acute kidney failure, unspecified: Secondary | ICD-10-CM | POA: Diagnosis not present

## 2019-02-18 DIAGNOSIS — N760 Acute vaginitis: Secondary | ICD-10-CM | POA: Diagnosis present

## 2019-02-18 DIAGNOSIS — R5381 Other malaise: Secondary | ICD-10-CM | POA: Diagnosis not present

## 2019-02-18 DIAGNOSIS — Z87440 Personal history of urinary (tract) infections: Secondary | ICD-10-CM | POA: Diagnosis not present

## 2019-02-18 DIAGNOSIS — G894 Chronic pain syndrome: Secondary | ICD-10-CM | POA: Diagnosis present

## 2019-02-18 LAB — COMPREHENSIVE METABOLIC PANEL
ALT: 13 U/L (ref 0–44)
AST: 14 U/L — ABNORMAL LOW (ref 15–41)
Albumin: 2.9 g/dL — ABNORMAL LOW (ref 3.5–5.0)
Alkaline Phosphatase: 66 U/L (ref 38–126)
Anion gap: 10 (ref 5–15)
BUN: 10 mg/dL (ref 6–20)
CO2: 19 mmol/L — ABNORMAL LOW (ref 22–32)
Calcium: 7.8 mg/dL — ABNORMAL LOW (ref 8.9–10.3)
Chloride: 109 mmol/L (ref 98–111)
Creatinine, Ser: 0.77 mg/dL (ref 0.44–1.00)
GFR calc Af Amer: >60 mL/min (ref >60)
GFR calc non Af Amer: >60 mL/min (ref >60)
Glucose, Bld: 89 mg/dL (ref 70–99)
Potassium: 3.2 mmol/L — ABNORMAL LOW (ref 3.5–5.1)
Sodium: 138 mmol/L (ref 135–145)
Total Bilirubin: 0.5 mg/dL (ref 0.3–1.2)
Total Protein: 6.6 g/dL (ref 6.5–8.1)

## 2019-02-18 LAB — URINALYSIS, ROUTINE W REFLEX MICROSCOPIC
Bilirubin Urine: NEGATIVE
Glucose, UA: NEGATIVE mg/dL
Ketones, ur: 5 mg/dL — AB
Nitrite: NEGATIVE
Protein, ur: NEGATIVE mg/dL
Specific Gravity, Urine: 1.011 (ref 1.005–1.030)
WBC, UA: >50 WBC/hpf — ABNORMAL HIGH (ref 0–5)
pH: 5 (ref 5.0–8.0)

## 2019-02-18 LAB — FERRITIN: Ferritin: 54 ng/mL (ref 11–307)

## 2019-02-18 LAB — PROTIME-INR
INR: 1.2 (ref 0.8–1.2)
Prothrombin Time: 14.8 seconds (ref 11.4–15.2)

## 2019-02-18 LAB — APTT: aPTT: 44 seconds — ABNORMAL HIGH (ref 24–36)

## 2019-02-18 LAB — SARS CORONAVIRUS 2 BY RT PCR (HOSPITAL ORDER, PERFORMED IN ~~LOC~~ HOSPITAL LAB): SARS Coronavirus 2: NEGATIVE

## 2019-02-18 LAB — LACTIC ACID, PLASMA: Lactic Acid, Venous: 1.5 mmol/L (ref 0.5–1.9)

## 2019-02-18 LAB — VITAMIN B12: Vitamin B-12: 413 pg/mL (ref 180–914)

## 2019-02-18 LAB — PROCALCITONIN: Procalcitonin: 21.95 ng/mL

## 2019-02-18 LAB — PREPARE RBC (CROSSMATCH)

## 2019-02-18 MED ORDER — ONDANSETRON HCL 4 MG/2ML IJ SOLN
4.0000 mg | Freq: Four times a day (QID) | INTRAMUSCULAR | Status: DC | PRN
  Administered 2019-02-21: 4 mg via INTRAVENOUS
  Filled 2019-02-18: qty 2

## 2019-02-18 MED ORDER — POTASSIUM CHLORIDE CRYS ER 20 MEQ PO TBCR
40.0000 meq | EXTENDED_RELEASE_TABLET | Freq: Once | ORAL | Status: AC
  Administered 2019-02-18: 40 meq via ORAL
  Filled 2019-02-18: qty 2

## 2019-02-18 MED ORDER — ACETAMINOPHEN 325 MG PO TABS
650.0000 mg | ORAL_TABLET | Freq: Four times a day (QID) | ORAL | Status: DC | PRN
  Administered 2019-02-18 – 2019-02-21 (×2): 650 mg via ORAL
  Filled 2019-02-18 (×3): qty 2

## 2019-02-18 MED ORDER — ACETAMINOPHEN 325 MG PO TABS
650.0000 mg | ORAL_TABLET | Freq: Once | ORAL | Status: AC
  Administered 2019-02-18: 07:00:00 650 mg via ORAL

## 2019-02-18 MED ORDER — FENTANYL CITRATE (PF) 100 MCG/2ML IJ SOLN: 12.5000 ug | INTRAMUSCULAR | Status: DC | PRN

## 2019-02-18 MED ORDER — TAMSULOSIN HCL 0.4 MG PO CAPS
0.4000 mg | ORAL_CAPSULE | Freq: Every day | ORAL | Status: DC
  Administered 2019-02-18 – 2019-02-22 (×4): 0.4 mg via ORAL
  Filled 2019-02-18 (×5): qty 1

## 2019-02-18 MED ORDER — LACTATED RINGERS IV SOLN
INTRAVENOUS | Status: AC
  Administered 2019-02-18 – 2019-02-20 (×5): via INTRAVENOUS

## 2019-02-18 MED ORDER — ACETAMINOPHEN 325 MG PO TABS
650.0000 mg | ORAL_TABLET | Freq: Once | ORAL | Status: AC
  Administered 2019-02-18: 650 mg via ORAL
  Filled 2019-02-18: qty 2

## 2019-02-18 MED ORDER — FENTANYL CITRATE (PF) 100 MCG/2ML IJ SOLN
25.0000 ug | INTRAMUSCULAR | Status: DC | PRN
  Administered 2019-02-18: 23:00:00 25 ug via INTRAVENOUS
  Filled 2019-02-18: qty 2

## 2019-02-18 MED ORDER — FENTANYL CITRATE (PF) 100 MCG/2ML IJ SOLN
12.5000 ug | INTRAMUSCULAR | Status: DC | PRN
  Administered 2019-02-18 (×3): 12.5 ug via INTRAVENOUS
  Filled 2019-02-18 (×3): qty 2

## 2019-02-18 MED ORDER — POTASSIUM CHLORIDE 10 MEQ/100ML IV SOLN
10.0000 meq | Freq: Once | INTRAVENOUS | Status: AC
  Administered 2019-02-18: 10 meq via INTRAVENOUS
  Filled 2019-02-18: qty 100

## 2019-02-18 MED ORDER — OXYCODONE HCL 5 MG PO TABS
5.0000 mg | ORAL_TABLET | ORAL | Status: DC | PRN
  Administered 2019-02-18 – 2019-02-22 (×4): 5 mg via ORAL
  Filled 2019-02-18 (×5): qty 1

## 2019-02-18 MED ORDER — ONDANSETRON HCL 4 MG PO TABS: 4.0000 mg | ORAL_TABLET | Freq: Four times a day (QID) | ORAL | Status: DC | PRN

## 2019-02-18 MED ORDER — SODIUM CHLORIDE 0.9 % IV BOLUS
1000.0000 mL | Freq: Once | INTRAVENOUS | Status: AC
  Administered 2019-02-18: 07:00:00 1000 mL via INTRAVENOUS

## 2019-02-18 MED ORDER — SODIUM CHLORIDE 0.9 % IV SOLN
2.0000 g | Freq: Once | INTRAVENOUS | Status: AC
  Administered 2019-02-18: 03:00:00 2 g via INTRAVENOUS
  Filled 2019-02-18: qty 2

## 2019-02-18 MED ORDER — SODIUM CHLORIDE 0.9 % IV SOLN
INTRAVENOUS | Status: DC
  Administered 2019-02-18 (×2): via INTRAVENOUS

## 2019-02-18 MED ORDER — MORPHINE SULFATE (PF) 2 MG/ML IV SOLN
2.0000 mg | INTRAVENOUS | Status: DC | PRN
  Administered 2019-02-18: 05:00:00 2 mg via INTRAVENOUS
  Filled 2019-02-18: qty 1

## 2019-02-18 MED ORDER — SODIUM CHLORIDE 0.9 % IV SOLN
2.0000 g | Freq: Three times a day (TID) | INTRAVENOUS | Status: DC
  Administered 2019-02-18 – 2019-02-19 (×5): 2 g via INTRAVENOUS
  Filled 2019-02-18 (×9): qty 2

## 2019-02-18 MED ORDER — CHLORHEXIDINE GLUCONATE CLOTH 2 % EX PADS
6.0000 | MEDICATED_PAD | Freq: Every day | CUTANEOUS | Status: DC
  Administered 2019-02-18 – 2019-02-19 (×2): 6 via TOPICAL

## 2019-02-18 NOTE — ED Notes (Signed)
Messaged provider 102.9 temp at 05:14 AM.

## 2019-02-18 NOTE — ED Notes (Signed)
Called lab and asked that the HIV lab be added on. An extra gold tube was sent down with when initial blood work was drawn.

## 2019-02-18 NOTE — ED Notes (Signed)
Messaged provider pt temperature 102.4 @ 3:00AM. Provider gave verbal order for tylenol.

## 2019-02-18 NOTE — Progress Notes (Signed)
RN notified by phlebotomist that patient is refusing lab draws.  RN followed up with patient but patient is asleep at this time.  RN discussed with boyfriend at bedside and he states "she doesn't want it."  RN updated MD on patients refusal.  RN will discuss with patient importance of lab tests.  Will continue to follow up.

## 2019-02-18 NOTE — ED Notes (Signed)
Lab confirmed they will add urine culture at this time.

## 2019-02-18 NOTE — ED Notes (Signed)
Prior to the administration of blood, pt had been extremely cold for over 1 hour. I brought her 6+ warm blankets and she was still cold. Prior to the administration of blood, her pain rated 10+/10. Blood administration started at 2:25 AM. Prior to blood administration at 2:13 AM pt temp 99.5. Messaged ED PA and attending MD. Ala Bent RN to page attending because I can't leave the pt until 2:40 AM.

## 2019-02-18 NOTE — ED Notes (Signed)
Transporting pt to floor now

## 2019-02-18 NOTE — Progress Notes (Addendum)
PROGRESS NOTE    Tracey Hall  [id number] DOB: 1980/12/29 DOA: 02/17/2019 PCP: [name], DO   Brief Narrative:  Tracey Hall is a 38 y.o. female with medical history significant of quadriplegia and neurogenic bladder, recurrent pyelonephritis, autonomic dysfunction, morbid obesity who presented to the ER with generalized weakness fever chills and weakness.  Patient was found to have sepsis secondary to acute pyelonephritis.  She is also hypotensive.  Patient reported that symptoms are similar to her previous pyelonephritis.  She has had a lot of nausea and episodes of vomiting.  Also some loose stools.  She has no melena.  No bright red blood per rectum and no hematemesis.  She has not been able to eat or drink adequately since yesterday.  Denied any sick contact.  Denied any exposure to COVID 19.  Patient seen in the ER and evaluated.  Sepsis syndrome noted.  So significant anemia.  Patient has been initiated on blood transfusion and is being admitted to the hospital for treatment.  She is being admitted to the hospital for treatment..  ED Course: Temperature is 103.1 blood pressure 84/71 pulse 118 respirate 24 oxygen sat 98% room air.  White count is 22.2 hemoglobin 6.1 platelet 515.  Sodium 133 potassium 2.8 chloride 102 CO2 of 19 BUN 19 creatinine 1.38 and calcium 8.3.  INR 1.3 glucose 120 lactic acid of 3.8.  Urinalysis shows cloudy urine.  Ketones small.  Leukocytosis moderate WBC more than 50 bacteria few.  Patient is being transfused 2 units of packed red blood cells and initiated on IV antibiotics.  Assessment & Plan:   Principal Problem:   Sepsis due to urinary tract infection ([place]) Active Problems:   Major depressive disorder, recurrent episode ([place])   Anxiety state   Quadriplegia following spinal cord injury ([place])   Neurogenic bladder   Recurrent UTI   Obesity   AKI (acute kidney injury) ([place])   Hypokalemia   Chronic pain syndrome   Sepsis ([place])  #1 Sepsis  2/2 to Urinary Tract Infection  Bilateral Hydronephrosis 2/2 Neurogenic Bladder: Secondary to pyelonephritis.  R sided CVA tenderness.  Continue cefepime and narrow as able Follow blood and urine cultures Renal US with bladder distension and hydro -> foley has been placed.  Start flomax.  Will need outpatient follow up with urology.  May need chronic indwelling catheter.  Discussed with urology.  Consider repeating US in 3-4 days.  # Hypotension: related to sepsis and anemia, follow after transfusion.  Map goal 65.   #8 acute kidney injury: baseline creatinine ~0.46.  1.38 on presentation.  Follow repeat labs.  UA consistent with UTI with RBC's and WBC's.  No protein.  #3 anemia: No obvious source.  Denies bleeding. Follow iron panel, b12, folate.  Hemoccult.   #4 quadriplegia: Patient has early decubitus ulcers as well.  Continue with supportive care.  #5 hypokalemia: repeat K pending.  She declined labs earlier today.  Looks like she hasn't had much potassium for repletion.  Will give 40 meq K and follow.  #6 anxiety with depression: Continue home regimen.  #7 chronic pain syndrome: Resume home regimen  DVT prophylaxis: SCD Code Status: full  Family Communication: sig other at bedside Disposition Plan: pending further improvement   Consultants:   Urology over phone   Procedures:   none  Antimicrobials:  Anti-infectives (From admission, onward)   Start     Dose/Rate Route Frequency Ordered Stop   02/18/19 1300  ceFEPIme (MAXIPIME) 2 g in  sodium chloride 0.9 % 100 mL IVPB     2 g 200 mL/hr over 30 Minutes Intravenous Every 8 hours 02/18/19 0323     02/18/19 0215  ceFEPIme (MAXIPIME) 2 g in sodium chloride 0.9 % 100 mL IVPB     2 g 200 mL/hr over 30 Minutes Intravenous  Once 02/18/19 0208 02/18/19 0359   02/17/19 2145  cefTRIAXone (ROCEPHIN) 1 g in sodium chloride 0.9 % 100 mL IVPB  Status:  Discontinued     1 g 200 mL/hr over 30 Minutes Intravenous Every 24 hours  02/17/19 2142 02/18/19 0323         Subjective: Feels tired.  Abdominal pain.  Feels like UTI.  Objective: Vitals:   02/18/19 1330 02/18/19 1430 02/18/19 1500 02/18/19 1600  BP: 118/61 (!) 106/53 115/64 (!) 110/56  Pulse:  89 89 90  Resp:  (!) 28 (!) 22 16  Temp:      TempSrc:      SpO2:  94% 97% 100%  Weight:      Height:        Intake/Output Summary (Last 24 hours) at 02/18/2019 1623 Last data filed at 02/18/2019 1621 Gross per 24 hour  Intake 7129.21 ml  Output 2600 ml  Net 4529.21 ml   Filed Weights   02/17/19 2112  Weight: 90.7 kg    Examination:  General exam: Appears calm and comfortable  Respiratory system: Clear to auscultation. Respiratory effort normal. Cardiovascular system: S1 & S2 heard, RRR.  Gastrointestinal system: Abdomen is nondistended, soft and mildly diffusely tender.  R sided CVA tenderness.  Central nervous system: Alert and oriented.  Quadriplegia. Extremities: no LEE, contractures to all 4 extremities Skin: No rashes, lesions or ulcers Psychiatry: Judgement and insight appear normal. Mood & affect appropriate.     Data Reviewed: I have personally reviewed following labs and imaging studies  CBC: Recent Labs  Lab 02/17/19 2200  WBC 22.2*  NEUTROABS 19.5*  HGB 6.0*  HCT 21.3*  MCV 68.1*  PLT [id number]*   Basic Metabolic Panel: Recent Labs  Lab 02/17/19 2200  NA 133*  K 2.8*  CL 102  CO2 19*  GLUCOSE 120*  BUN 19  CREATININE 1.38*  CALCIUM 8.3*   GFR: Estimated Creatinine Clearance: 63.4 mL/min (A) (by C-G formula based on SCr of 1.38 mg/dL (H)). Liver Function Tests: Recent Labs  Lab 02/17/19 2200  AST 19  ALT 14  ALKPHOS 65  BILITOT 0.2*  PROT 7.1  ALBUMIN 3.3*   No results for input(s): LIPASE, AMYLASE in the last 168 hours. No results for input(s): AMMONIA in the last 168 hours. Coagulation Profile: Recent Labs  Lab 02/17/19 2200  INR 1.3*   Cardiac Enzymes: No results for input(s): CKTOTAL, CKMB,  CKMBINDEX, TROPONINI in the last 168 hours. BNP (last 3 results) No results for input(s): PROBNP in the last 8760 hours. HbA1C: No results for input(s): HGBA1C in the last 72 hours. CBG: No results for input(s): GLUCAP in the last 168 hours. Lipid Profile: No results for input(s): CHOL, HDL, LDLCALC, TRIG, CHOLHDL, LDLDIRECT in the last 72 hours. Thyroid Function Tests: No results for input(s): TSH, T4TOTAL, FREET4, T3FREE, THYROIDAB in the last 72 hours. Anemia Panel: No results for input(s): VITAMINB12, FOLATE, FERRITIN, TIBC, IRON, RETICCTPCT in the last 72 hours. Sepsis Labs: Recent Labs  Lab 02/17/19 2158 02/17/19 2326  LATICACIDVEN 3.8* 1.5    Recent Results (from the past 240 hour(s))  Culture, blood (Routine x 2)  Status: None (Preliminary result)   Collection Time: 02/17/19  9:58 PM   Specimen: BLOOD  Result Value Ref Range Status   Specimen Description   Final    BLOOD LEFT ANTECUBITAL Performed at [facility] [address], [place], [place] [address]    Special Requests   Final    BOTTLES DRAWN AEROBIC AND ANAEROBIC Blood Culture results may not be optimal due to an excessive volume of blood received in culture bottles Performed at [facility] [address], [place], [place] [address]    Culture   Final    NO GROWTH < 24 HOURS Performed at [facility] [address] [place], [place] [address]    Report Status PENDING  Incomplete  Culture, blood (Routine x 2)     Status: None (Preliminary result)   Collection Time: 02/17/19 10:00 PM   Specimen: BLOOD LEFT HAND  Result Value Ref Range Status   Specimen Description   Final    BLOOD LEFT HAND Performed at [facility] [address], [place], [place] [address]    Special Requests   Final    BOTTLES DRAWN AEROBIC ONLY Blood Culture adequate volume Performed at [facility] [address] [place], [place] [address]     Culture   Final    NO GROWTH < 24 HOURS Performed at [facility] [address], [place],  [address]    Report Status PENDING  Incomplete  SARS Coronavirus 2 [facility] order, Performed in [facility] hospital lab) Nasopharyngeal Nasopharyngeal Swab     Status: None   Collection Time: 02/17/19 11:04 PM   Specimen: Nasopharyngeal Swab  Result Value Ref Range Status   SARS Coronavirus 2 NEGATIVE NEGATIVE Final    Comment: (NOTE) If result is NEGATIVE SARS-CoV-2 target nucleic acids are NOT DETECTED. The SARS-CoV-2 RNA is generally detectable in upper and lower  respiratory specimens during the acute phase of infection. The lowest  concentration of SARS-CoV-2 viral copies this assay can detect is 250  copies / mL. A negative result does not preclude SARS-CoV-2 infection  and should not be used as the sole basis for treatment or other  patient management decisions.  A negative result may occur with  improper specimen collection / handling, submission of specimen other  than nasopharyngeal swab, presence of viral mutation(s) within the  areas targeted by this assay, and inadequate number of viral copies  (<250 copies / mL). A negative result must be combined with clinical  observations, patient history, and epidemiological information. If result is POSITIVE SARS-CoV-2 target nucleic acids are DETECTED. The SARS-CoV-2 RNA is generally detectable in upper and lower  respiratory specimens dur ing the acute phase of infection.  Positive  results are indicative of active infection with SARS-CoV-2.  Clinical  correlation with patient history and other diagnostic information is  necessary to determine patient infection status.  Positive results do  not rule out bacterial infection or co-infection with other viruses. If result is PRESUMPTIVE POSTIVE SARS-CoV-2 nucleic acids MAY BE PRESENT.   A presumptive positive result was obtained on the submitted specimen  and confirmed on  repeat testing.  While 2019 novel coronavirus  (SARS-CoV-2) nucleic acids may be present in the submitted sample  additional confirmatory testing may be necessary for epidemiological  and / or clinical management purposes  to differentiate between  SARS-CoV-2 and other Sarbecovirus currently known to infect humans.  If clinically indicated additional  testing with an alternate test  methodology [id number]) is advised. The SARS-CoV-2 RNA is generally  detectable in upper and lower respiratory sp ecimens during the acute  phase of infection. The expected result is Negative. Fact Sheet for Patients:  StrictlyIdeas.no Fact Sheet for Healthcare Providers: BankingDealers.co.za This test is not yet approved or cleared by the [place] FDA and has been authorized for detection and/or diagnosis of SARS-CoV-2 by FDA under an Emergency Use Authorization (EUA).  This EUA will remain in effect (meaning this test can be used) for the duration of the COVID-19 declaration under Section 564(b)(1) of the Act, 21 U.S.C. section 360bbb-3(b)(1), unless the authorization is terminated or revoked sooner. Performed at [facility], [place] [address], [place], [place] [address]          Radiology Studies: US Renal  Result Date: 02/18/2019 CLINICAL DATA:  Acute kidney injury. EXAM: RENAL / URINARY TRACT ULTRASOUND COMPLETE COMPARISON:  None. FINDINGS: Right Kidney: Renal measurements: 11.5 x 6.7 x 6.9 cm = volume: 276 mL. Mild hydronephrosis. No focal lesions. Left Kidney: Renal measurements: 13.8 x 5.9 x 6.4 cm = volume: 270 mL. Mild to moderate hydronephrosis. No focal lesions. Bladder: The bladder is distended. IMPRESSION: Bilateral hydronephrosis and bladder distension. If a urinary catheter is present, it may not be adequately draining the bladder. Electronically Signed   By: Aletta Edouard M.D.   On: 02/18/2019 09:17   Dg Chest Port 1  View  Result Date: 02/17/2019 CLINICAL DATA:  Fever EXAM: PORTABLE CHEST 1 VIEW COMPARISON:  05/11/2018 FINDINGS: Heart and mediastinal contours are within normal limits. No focal opacities or effusions. No acute bony abnormality. IMPRESSION: No active disease. Electronically Signed   By: Rolm Baptise M.D.   On: 02/17/2019 22:31        Scheduled Meds: . Chlorhexidine Gluconate Cloth  6 each Topical Daily  . tamsulosin  0.4 mg Oral QPC supper   Continuous Infusions: . sodium chloride 125 mL/hr at 02/18/19 1603  . ceFEPime (MAXIPIME) IV Stopped (02/18/19 1234)     LOS: 0 days    Time spent: over 30 min 40 min critical care time with hypotension, severe anemia, requiring transfusion and IVF   Fayrene Helper, MD [facility] Pager AMION  If 7PM-7AM, please contact night-coverage [email or web address] Password [facility] 02/18/2019, 4:23 PM

## 2019-02-18 NOTE — ED Notes (Addendum)
Applied bear hugger on 43 C.

## 2019-02-18 NOTE — Progress Notes (Signed)
Pharmacy Antibiotic Note  Tracey Hall is a 38 y.o. female admitted on 02/17/2019 with sepsis and UTI.  Pharmacy has been consulted for Cefepime dosing.  Plan: Cefepime 2gm iv q8hr  Height: 5\' 6"  (167.6 cm) Weight: 200 lb (90.7 kg) IBW/kg (Calculated) : 59.3  Temp (24hrs), Avg:101.5 F (38.6 C), Min:99.5 F (37.5 C), Max:103.1 F (39.5 C)  Recent Labs  Lab 02/17/19 2158 02/17/19 2200 02/17/19 2326  WBC  --  22.2*  --   CREATININE  --  1.38*  --   LATICACIDVEN 3.8*  --  1.5    Estimated Creatinine Clearance: 63.4 mL/min (A) (by C-G formula based on SCr of 1.38 mg/dL (H)).    Allergies  Allergen Reactions  . Latex Rash    Antimicrobials this admission: Cefepime 02/18/2019 >>   Dose adjustments this admission: -  Microbiology results: -  Thank you for allowing pharmacy to be a part of this patient's care.  Nani Skillern Crowford 02/18/2019 3:23 AM
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
## 2019-02-18 NOTE — ED Notes (Signed)
Called lab to add on HIV lab

## 2019-02-18 NOTE — ED Notes (Signed)
Tried to pull morphine from pyxis but it's not showing up yet.

## 2019-02-18 NOTE — ED Notes (Signed)
Attempted to call report [phone]. Phone range for 2 minutes. No answer.
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
## 2019-02-18 NOTE — ED Notes (Signed)
Called [phone], person said to RN will call back when she is available.

## 2019-02-18 NOTE — ED Notes (Signed)
Told pt her new bed was ready, and I'm waiting to call report. Pt said, "Any pain medicine yet. Ya'll are not going to be able to move me. That's going to be too intense." I told her I would let the provider know. Messaged provider.

## 2019-02-18 NOTE — ED Notes (Signed)
Picked up blood from lab @ 210 AM

## 2019-02-18 NOTE — ED Notes (Signed)
Urine canister contains 400 mL obtained with purewick

## 2019-02-18 NOTE — ED Notes (Signed)
Pt said she can not take the pain much longer and she's very upset that she has been in pain so long. Messaged provider.

## 2019-02-18 NOTE — ED Notes (Addendum)
Verbally made provider aware pt had extreme anxiety before and after covid swab. Provider said to wait on the Occult blood test and rectal temp.

## 2019-02-18 NOTE — ED Notes (Signed)
Attending assessed pt, said to put the bear hugger on the pt, and continue the blood transfusion regardless of the 1 degree temperature increase during first 15 min of transfusion.

## 2019-02-18 NOTE — ED Notes (Signed)
Tried to pull morphine from pyxis but not showing up for pt. Called pharmacy. Pharmacist said to try again, he just verified it.

## 2019-02-19 LAB — CBC
HCT: 25.4 % — ABNORMAL LOW (ref 36.0–46.0)
Hemoglobin: 7.5 g/dL — ABNORMAL LOW (ref 12.0–15.0)
MCH: 22 pg — ABNORMAL LOW (ref 26.0–34.0)
MCHC: 29.5 g/dL — ABNORMAL LOW (ref 30.0–36.0)
MCV: 74.5 fL — ABNORMAL LOW (ref 80.0–100.0)
Platelets: 401 10*3/uL — ABNORMAL HIGH (ref 150–400)
RBC: 3.41 MIL/uL — ABNORMAL LOW (ref 3.87–5.11)
RDW: 22.7 % — ABNORMAL HIGH (ref 11.5–15.5)
WBC: 11.1 10*3/uL — ABNORMAL HIGH (ref 4.0–10.5)
nRBC: 0 % (ref 0.0–0.2)

## 2019-02-19 LAB — COMPREHENSIVE METABOLIC PANEL
ALT: 15 U/L (ref 0–44)
AST: 17 U/L (ref 15–41)
Albumin: 2.9 g/dL — ABNORMAL LOW (ref 3.5–5.0)
Alkaline Phosphatase: 64 U/L (ref 38–126)
Anion gap: 10 (ref 5–15)
BUN: 9 mg/dL (ref 6–20)
CO2: 18 mmol/L — ABNORMAL LOW (ref 22–32)
Calcium: 7.9 mg/dL — ABNORMAL LOW (ref 8.9–10.3)
Chloride: 109 mmol/L (ref 98–111)
Creatinine, Ser: 0.81 mg/dL (ref 0.44–1.00)
GFR calc Af Amer: >60 mL/min (ref >60)
GFR calc non Af Amer: >60 mL/min (ref >60)
Glucose, Bld: 92 mg/dL (ref 70–99)
Potassium: 3.8 mmol/L (ref 3.5–5.1)
Sodium: 137 mmol/L (ref 135–145)
Total Bilirubin: 0.5 mg/dL (ref 0.3–1.2)
Total Protein: 6.3 g/dL — ABNORMAL LOW (ref 6.5–8.1)

## 2019-02-19 LAB — CORTISOL-AM, BLOOD: Cortisol - AM: 5.6 ug/dL — ABNORMAL LOW (ref 6.7–22.6)

## 2019-02-19 LAB — MAGNESIUM: Magnesium: 1.7 mg/dL (ref 1.7–2.4)

## 2019-02-19 LAB — IRON AND TIBC
Iron: <5 ug/dL — ABNORMAL LOW (ref 28–170)
TIBC: 309 ug/dL (ref 250–450)

## 2019-02-19 LAB — PROCALCITONIN: Procalcitonin: 15.24 ng/mL

## 2019-02-19 LAB — HIV ANTIBODY (ROUTINE TESTING W REFLEX): HIV Screen 4th Generation wRfx: NONREACTIVE

## 2019-02-19 LAB — FOLATE: Folate: 8.7 ng/mL (ref >5.9)

## 2019-02-19 MED ORDER — COSYNTROPIN 0.25 MG IJ SOLR
0.2500 mg | Freq: Once | INTRAMUSCULAR | Status: AC
  Administered 2019-02-20: 0.25 mg via INTRAVENOUS
  Filled 2019-02-19 (×3): qty 0.25

## 2019-02-19 MED ORDER — MORPHINE SULFATE (PF) 2 MG/ML IV SOLN
2.0000 mg | INTRAVENOUS | Status: DC | PRN
  Administered 2019-02-19 – 2019-02-20 (×3): 2 mg via INTRAVENOUS
  Filled 2019-02-19 (×3): qty 1

## 2019-02-19 NOTE — Progress Notes (Signed)
PROGRESS NOTE    BIJOUX Hall  [id number] DOB: 05-28-1981 DOA: 02/17/2019 PCP: [name], DO   Brief Narrative:  Tracey Hall is a 38 y.o. female with medical history significant of quadriplegia and neurogenic bladder, recurrent pyelonephritis, autonomic dysfunction, morbid obesity who presented to the ER with generalized weakness fever chills and weakness.  Patient was found to have sepsis secondary to acute pyelonephritis.  She is also hypotensive.  Patient reported that symptoms are similar to her previous pyelonephritis.  She has had a lot of nausea and episodes of vomiting.  Also some loose stools.  She has no melena.  No bright red blood per rectum and no hematemesis.  She has not been able to eat or drink adequately since yesterday.  Denied any sick contact.  Denied any exposure to COVID 19.  Patient seen in the ER and evaluated.  Sepsis syndrome noted.  So significant anemia.  Patient has been initiated on blood transfusion and is being admitted to the hospital for treatment.  She is being admitted to the hospital for treatment..  ED Course: Temperature is 103.1 blood pressure 84/71 pulse 118 respirate 24 oxygen sat 98% room air.  White count is 22.2 hemoglobin 6.1 platelet 515.  Sodium 133 potassium 2.8 chloride 102 CO2 of 19 BUN 19 creatinine 1.38 and calcium 8.3.  INR 1.3 glucose 120 lactic acid of 3.8.  Urinalysis shows cloudy urine.  Ketones small.  Leukocytosis moderate WBC more than 50 bacteria few.  Patient is being transfused 2 units of packed red blood cells and initiated on IV antibiotics.  Assessment & Plan:   Principal Problem:   Sepsis due to urinary tract infection ([place]) Active Problems:   Major depressive disorder, recurrent episode ([place])   Anxiety state   Quadriplegia following spinal cord injury ([place])   Neurogenic bladder   Recurrent UTI   Obesity   AKI (acute kidney injury) ([place])   Hypokalemia   Chronic pain syndrome   Sepsis ([place])  #1 Sepsis  2/2 to Urinary Tract Infection  Bilateral Hydronephrosis 2/2 Neurogenic Bladder: Secondary to pyelonephritis.  R sided CVA tenderness.  Continue cefepime and narrow as able Follow blood and urine cultures (pending) Renal US with bladder distension and hydro -> foley has been placed.  Start flomax.  Will need outpatient follow up with urology.  May need chronic indwelling catheter.  Discussed with urology.  Consider repeating US in 3-4 days. Procalcitonin is downtrending as well as WBC count. Recurrent fever overnight  # Hypotension: improved, continue to monitor. Cortisol low this AM, follow cort stim test tomorrow  #8 acute kidney injury: baseline creatinine ~0.46.  1.38 on presentation.  0.81 today, improving.  Follow s/p foley catheter placement, transfusion, and IVF.  UA consistent with UTI with RBC's and WBC's.  No protein.  # NAGMA: continue to monitor  #3 anemia: No obvious source.  Denies bleeding. Follow iron panel (pending), b12 (wnl), folate (pending).  Hemoccult pending.   #4 quadriplegia: Patient has early decubitus ulcers as well.  Continue with supportive care.  Frequent turns.  #5 hypokalemia: improved, follow  #6 anxiety with depression: Continue home regimen.  #7 chronic pain syndrome: Resume home regimen  DVT prophylaxis: SCD Code Status: full  Family Communication: sig other at bedside Disposition Plan: pending further improvement   Consultants:   Urology over phone   Procedures:   none  Antimicrobials:  Anti-infectives (From admission, onward)   Start     Dose/Rate Route Frequency Ordered Stop  02/18/19 1300  ceFEPIme (MAXIPIME) 2 g in sodium chloride 0.9 % 100 mL IVPB     2 g 200 mL/hr over 30 Minutes Intravenous Every 8 hours 02/18/19 0323     02/18/19 0215  ceFEPIme (MAXIPIME) 2 g in sodium chloride 0.9 % 100 mL IVPB     2 g 200 mL/hr over 30 Minutes Intravenous  Once 02/18/19 0208 02/18/19 0359   02/17/19 2145  cefTRIAXone (ROCEPHIN) 1 g  in sodium chloride 0.9 % 100 mL IVPB  Status:  Discontinued     1 g 200 mL/hr over 30 Minutes Intravenous Every 24 hours 02/17/19 2142 02/18/19 0323         Subjective: Feels worse at night, feeling ok right now  Objective: Vitals:   02/19/19 0200 02/19/19 0335 02/19/19 0400 02/19/19 0500  BP: 125/64  128/62 137/75  Pulse:      Resp: (!) 22  (!) 23 (!) 24  Temp:  98.3 F (36.8 C)    TempSrc:  Oral    SpO2:      Weight:      Height:        Intake/Output Summary (Last 24 hours) at 02/19/2019 1156 Last data filed at 02/19/2019 0600 Gross per 24 hour  Intake 3869.63 ml  Output 3100 ml  Net 769.63 ml   Filed Weights   02/17/19 2112  Weight: 90.7 kg    Examination:  General: No acute distress. Cardiovascular: Heart sounds show a regular rate, and rhythm Lungs: Clear to auscultation bilaterally  Abdomen: Soft, nontender, nondistended  Neurological: Alert and oriented 3. Cranial nerves II through XII grossly intact. quadriplegia Skin: Warm and dry. No rashes or lesions. Extremities: contractures .     Data Reviewed: I have personally reviewed following labs and imaging studies  CBC: Recent Labs  Lab 02/17/19 2200 02/19/19 0220  WBC 22.2* 11.1*  NEUTROABS 19.5*  --   HGB 6.0* 7.5*  HCT 21.3* 25.4*  MCV 68.1* 74.5*  PLT 515* 123XX123*   Basic Metabolic Panel: Recent Labs  Lab 02/17/19 2200 02/18/19 1647 02/19/19 0220  NA 133* 138 137  K 2.8* 3.2* 3.8  CL 102 109 109  CO2 19* 19* 18*  GLUCOSE 120* 89 92  BUN 19 10 9   CREATININE 1.38* 0.77 0.81  CALCIUM 8.3* 7.8* 7.9*  MG  --   --  1.7   GFR: Estimated Creatinine Clearance: 107.9 mL/min (by C-G formula based on SCr of 0.81 mg/dL). Liver Function Tests: Recent Labs  Lab 02/17/19 2200 02/18/19 1647 02/19/19 0220  AST 19 14* 17  ALT 14 13 15   ALKPHOS 65 66 64  BILITOT 0.2* 0.5 0.5  PROT 7.1 6.6 6.3*  ALBUMIN 3.3* 2.9* 2.9*   No results for input(s): LIPASE, AMYLASE in the last 168 hours. No  results for input(s): AMMONIA in the last 168 hours. Coagulation Profile: Recent Labs  Lab 02/17/19 2200 02/18/19 1647  INR 1.3* 1.2   Cardiac Enzymes: No results for input(s): CKTOTAL, CKMB, CKMBINDEX, TROPONINI in the last 168 hours. BNP (last 3 results) No results for input(s): PROBNP in the last 8760 hours. HbA1C: No results for input(s): HGBA1C in the last 72 hours. CBG: No results for input(s): GLUCAP in the last 168 hours. Lipid Profile: No results for input(s): CHOL, HDL, LDLCALC, TRIG, CHOLHDL, LDLDIRECT in the last 72 hours. Thyroid Function Tests: No results for input(s): TSH, T4TOTAL, FREET4, T3FREE, THYROIDAB in the last 72 hours. Anemia Panel: Recent Labs    02/18/19  [place]   Sepsis Labs: Recent Labs  Lab 02/17/19 2158 02/17/19 2326 02/18/19 1647 02/19/19 0220  PROCALCITON  --   --  21.95 15.24  LATICACIDVEN 3.8* 1.5  --   --     Recent Results (from the past 240 hour(s))  Culture, blood (Routine x 2)     Status: None (Preliminary result)   Collection Time: 02/17/19  9:58 PM   Specimen: BLOOD  Result Value Ref Range Status   Specimen Description   Final    BLOOD LEFT ANTECUBITAL Performed at [facility], [place] [address], [place], [place] [address]    Special Requests   Final    BOTTLES DRAWN AEROBIC AND ANAEROBIC Blood Culture results may not be optimal due to an excessive volume of blood received in culture bottles Performed at [facility] [address], [place], [place] [address]    Culture   Final    NO GROWTH 2 DAYS Performed at [facility] [address], [place], [place] [address]    Report Status PENDING  Incomplete  Culture, blood (Routine x 2)     Status: None (Preliminary result)   Collection Time: 02/17/19 10:00 PM   Specimen: BLOOD LEFT HAND  Result Value Ref Range Status   Specimen Description   Final    BLOOD LEFT HAND Performed at [facility] [address] [place], [place] [address]    Special Requests   Final    BOTTLES DRAWN AEROBIC ONLY Blood Culture adequate volume Performed at [facility] [address], [place], [place] [address]    Culture   Final    NO GROWTH 2 DAYS Performed at [facility] [address], [place], [place] [address]    Report Status PENDING  Incomplete  SARS Coronavirus 2 [facility] order, Performed in [facility] hospital lab) Nasopharyngeal Nasopharyngeal Swab     Status: None   Collection Time: 02/17/19 11:04 PM   Specimen: Nasopharyngeal Swab  Result Value Ref Range Status   SARS Coronavirus 2 NEGATIVE NEGATIVE Final    Comment: (NOTE) If result is NEGATIVE SARS-CoV-2 target nucleic acids are NOT DETECTED. The SARS-CoV-2 RNA is generally detectable in upper and lower  respiratory specimens during the acute phase of infection. The lowest  concentration of SARS-CoV-2 viral copies this assay can detect is 250  copies / mL. A negative result does not preclude SARS-CoV-2 infection  and should not be used as the sole basis for treatment or other  patient management decisions.  A negative result may occur with  improper specimen collection / handling, submission of specimen other  than nasopharyngeal swab, presence of viral mutation(s) within the  areas targeted by this assay, and inadequate number of viral copies  (<250 copies / mL). A negative result must be combined with clinical  observations, patient history, and epidemiological information. If result is POSITIVE SARS-CoV-2 target nucleic acids are DETECTED. The SARS-CoV-2 RNA is generally detectable in upper and lower  respiratory specimens dur ing the acute phase of infection.  Positive  results are indicative of active infection with SARS-CoV-2.  Clinical  correlation with patient history and other diagnostic information is  necessary to determine patient infection status.  Positive results do   not rule out bacterial infection or co-infection with other viruses. If result is PRESUMPTIVE POSTIVE SARS-CoV-2 nucleic acids MAY BE PRESENT.   A presumptive positive result was obtained  on the submitted specimen  and confirmed on repeat testing.  While 2019 novel coronavirus  (SARS-CoV-2) nucleic acids may be present in the submitted sample  additional confirmatory testing may be necessary for epidemiological  and / or clinical management purposes  to differentiate between  SARS-CoV-2 and other Sarbecovirus currently known to infect humans.  If clinically indicated additional testing with an alternate test  methodology [phone]) is advised. The SARS-CoV-2 RNA is generally  detectable in upper and lower respiratory sp ecimens during the acute  phase of infection. The expected result is Negative. Fact Sheet for Patients:  StrictlyIdeas.no Fact Sheet for Healthcare Providers: BankingDealers.co.za This test is not yet approved or cleared by the [place] FDA and has been authorized for detection and/or diagnosis of SARS-CoV-2 by FDA under an Emergency Use Authorization (EUA).  This EUA will remain in effect (meaning this test can be used) for the duration of the COVID-19 declaration under Section 564(b)(1) of the Act, 21 U.S.C. section 360bbb-3(b)(1), unless the authorization is terminated or revoked sooner. Performed at [facility], [place] [address], [place], [place] [address]          Radiology Studies: US Renal  Result Date: 02/18/2019 CLINICAL DATA:  Acute kidney injury. EXAM: RENAL / URINARY TRACT ULTRASOUND COMPLETE COMPARISON:  None. FINDINGS: Right Kidney: Renal measurements: 11.5 x 6.7 x 6.9 cm = volume: 276 mL. Mild hydronephrosis. No focal lesions. Left Kidney: Renal measurements: 13.8 x 5.9 x 6.4 cm = volume: 270 mL. Mild to moderate hydronephrosis. No focal lesions. Bladder: The bladder is distended.  IMPRESSION: Bilateral hydronephrosis and bladder distension. If a urinary catheter is present, it may not be adequately draining the bladder. Electronically Signed   By: Aletta Edouard M.D.   On: 02/18/2019 09:17   Dg Chest Port 1 View  Result Date: 02/17/2019 CLINICAL DATA:  Fever EXAM: PORTABLE CHEST 1 VIEW COMPARISON:  05/11/2018 FINDINGS: Heart and mediastinal contours are within normal limits. No focal opacities or effusions. No acute bony abnormality. IMPRESSION: No active disease. Electronically Signed   By: Rolm Baptise M.D.   On: 02/17/2019 22:31        Scheduled Meds: . Chlorhexidine Gluconate Cloth  6 each Topical Daily  . [START ON 02/20/2019] cosyntropin  0.25 mg Intravenous Once  . tamsulosin  0.4 mg Oral QPC supper   Continuous Infusions: . ceFEPime (MAXIPIME) IV Stopped (02/19/19 0548)  . lactated ringers 125 mL/hr at 02/19/19 1009     LOS: 1 day    Time spent: over 30 min   Fayrene Helper, MD [facility] Pager AMION  If 7PM-7AM, please contact night-coverage [email or web address] Password [email or web address] 02/19/2019, 11:56 AM

## 2019-02-19 NOTE — Progress Notes (Signed)
Report called and given to [facility], RN on [place] and pt transferred to 1436. Left unit on bed pushed by this Probation officer and Romelle Starcher, Therapist, sports. Arrived in stable condition. VWilliams, Therapist, sports.

## 2019-02-20 ENCOUNTER — Inpatient Hospital Stay (HOSPITAL_COMMUNITY): Payer: Medicare Other

## 2019-02-20 LAB — TYPE AND SCREEN
ABO/RH(D): O POS
Antibody Screen: NEGATIVE
Unit division: 0
Unit division: 0

## 2019-02-20 LAB — COMPREHENSIVE METABOLIC PANEL
ALT: 18 U/L (ref 0–44)
AST: 16 U/L (ref 15–41)
Albumin: 2.8 g/dL — ABNORMAL LOW (ref 3.5–5.0)
Alkaline Phosphatase: 74 U/L (ref 38–126)
Anion gap: 9 (ref 5–15)
BUN: 10 mg/dL (ref 6–20)
CO2: 24 mmol/L (ref 22–32)
Calcium: 8.3 mg/dL — ABNORMAL LOW (ref 8.9–10.3)
Chloride: 104 mmol/L (ref 98–111)
Creatinine, Ser: 0.67 mg/dL (ref 0.44–1.00)
GFR calc Af Amer: >60 mL/min (ref >60)
GFR calc non Af Amer: >60 mL/min (ref >60)
Glucose, Bld: 108 mg/dL — ABNORMAL HIGH (ref 70–99)
Potassium: 3.3 mmol/L — ABNORMAL LOW (ref 3.5–5.1)
Sodium: 137 mmol/L (ref 135–145)
Total Bilirubin: 0.2 mg/dL — ABNORMAL LOW (ref 0.3–1.2)
Total Protein: 6.4 g/dL — ABNORMAL LOW (ref 6.5–8.1)

## 2019-02-20 LAB — FOLATE RBC
Folate, Hemolysate: 342 ng/mL
Folate, RBC: 1300 ng/mL (ref >498)
Hematocrit: 26.3 % — ABNORMAL LOW (ref 34.0–46.6)

## 2019-02-20 LAB — CBC
HCT: 26.1 % — ABNORMAL LOW (ref 36.0–46.0)
Hemoglobin: 7.7 g/dL — ABNORMAL LOW (ref 12.0–15.0)
MCH: 21.8 pg — ABNORMAL LOW (ref 26.0–34.0)
MCHC: 29.5 g/dL — ABNORMAL LOW (ref 30.0–36.0)
MCV: 73.7 fL — ABNORMAL LOW (ref 80.0–100.0)
Platelets: 400 10*3/uL (ref 150–400)
RBC: 3.54 MIL/uL — ABNORMAL LOW (ref 3.87–5.11)
RDW: 23.3 % — ABNORMAL HIGH (ref 11.5–15.5)
WBC: 6.7 10*3/uL (ref 4.0–10.5)
nRBC: 0 % (ref 0.0–0.2)

## 2019-02-20 LAB — ACTH STIMULATION, 3 TIME POINTS
Cortisol, 30 Min: 16.6 ug/dL
Cortisol, 60 Min: 20.3 ug/dL
Cortisol, Base: 5.9 ug/dL

## 2019-02-20 LAB — BPAM RBC
Blood Product Expiration Date: 202010012359
Blood Product Expiration Date: 202010012359
ISSUE DATE / TIME: 202008290208
ISSUE DATE / TIME: 202008290755
Unit Type and Rh: 5100
Unit Type and Rh: 5100

## 2019-02-20 LAB — MAGNESIUM: Magnesium: 1.6 mg/dL — ABNORMAL LOW (ref 1.7–2.4)

## 2019-02-20 LAB — PROCALCITONIN: Procalcitonin: 27.35 ng/mL

## 2019-02-20 MED ORDER — MAGNESIUM SULFATE 2 GM/50ML IV SOLN
2.0000 g | Freq: Once | INTRAVENOUS | Status: AC
  Administered 2019-02-20: 19:00:00 2 g via INTRAVENOUS
  Filled 2019-02-20: qty 50

## 2019-02-20 MED ORDER — HYDROXYZINE HCL 25 MG PO TABS
25.0000 mg | ORAL_TABLET | Freq: Three times a day (TID) | ORAL | Status: DC | PRN
  Administered 2019-02-21: 25 mg via ORAL
  Filled 2019-02-20: qty 1

## 2019-02-20 MED ORDER — FERROUS SULFATE 325 (65 FE) MG PO TABS
325.0000 mg | ORAL_TABLET | Freq: Every day | ORAL | Status: DC
  Administered 2019-02-21: 325 mg via ORAL
  Filled 2019-02-20 (×3): qty 1

## 2019-02-20 MED ORDER — POLYETHYLENE GLYCOL 3350 17 G PO PACK: 17.0000 g | PACK | Freq: Every day | ORAL | Status: DC | PRN

## 2019-02-20 MED ORDER — DULOXETINE HCL 30 MG PO CPEP: 30.0000 mg | ORAL_CAPSULE | Freq: Every day | ORAL | Status: DC

## 2019-02-20 MED ORDER — POTASSIUM CHLORIDE CRYS ER 20 MEQ PO TBCR
40.0000 meq | EXTENDED_RELEASE_TABLET | Freq: Once | ORAL | Status: AC
  Administered 2019-02-20: 40 meq via ORAL
  Filled 2019-02-20: qty 2

## 2019-02-20 MED ORDER — BUTALBITAL-APAP-CAFFEINE 50-325-40 MG PO TABS: 2.0000 | ORAL_TABLET | Freq: Once | ORAL | Status: DC | PRN

## 2019-02-20 MED ORDER — SODIUM CHLORIDE 0.9 % IV SOLN
1.0000 g | INTRAVENOUS | Status: DC
  Administered 2019-02-20 – 2019-02-21 (×2): 1 g via INTRAVENOUS
  Filled 2019-02-20: qty 10
  Filled 2019-02-20 (×2): qty 1

## 2019-02-20 MED ORDER — CLONAZEPAM 0.5 MG PO TABS: 0.5000 mg | ORAL_TABLET | Freq: Once | ORAL | Status: DC | PRN

## 2019-02-20 NOTE — Progress Notes (Signed)
Patient refused much of RN assessment and stated to "just stop it" as RN attempted to assess.  RN communicated with MD who addressed patient concerns and consulted with urology.  RN will continue to monitor and communicate with patient and communicate with MD.

## 2019-02-20 NOTE — Progress Notes (Signed)
PROGRESS NOTE    Tracey Hall  [id number] DOB: 04-13-1981 DOA: 02/17/2019 PCP: [name], DO   Brief Narrative:  Tracey Hall is Tracey Hall 38 y.o. female with medical history significant of quadriplegia and neurogenic bladder, recurrent pyelonephritis, autonomic dysfunction, morbid obesity who presented to the ER with generalized weakness fever chills and weakness.  Patient was found to have sepsis secondary to acute pyelonephritis.  She is also hypotensive.  Patient reported that symptoms are similar to her previous pyelonephritis.  She has had Tracey Hall lot of nausea and episodes of vomiting.  Also some loose stools.  She has no melena.  No bright red blood per rectum and no hematemesis.  She has not been able to eat or drink adequately since yesterday.  Denied any sick contact.  Denied any exposure to COVID 19.  Patient seen in the ER and evaluated.  Sepsis syndrome noted.  So significant anemia.  Patient has been initiated on blood transfusion and is being admitted to the hospital for treatment.  She is being admitted to the hospital for treatment..  ED Course: Temperature is 103.1 blood pressure 84/71 pulse 118 respirate 24 oxygen sat 98% room air.  White count is 22.2 hemoglobin 6.1 platelet 515.  Sodium 133 potassium 2.8 chloride 102 CO2 of 19 BUN 19 creatinine 1.38 and calcium 8.3.  INR 1.3 glucose 120 lactic acid of 3.8.  Urinalysis shows cloudy urine.  Ketones small.  Leukocytosis moderate WBC more than 50 bacteria few.  Patient is being transfused 2 units of packed red blood cells and initiated on IV antibiotics.  Assessment & Plan:   Principal Problem:   Sepsis due to urinary tract infection ([place]) Active Problems:   Major depressive disorder, recurrent episode ([place])   Anxiety state   Quadriplegia following spinal cord injury ([place])   Neurogenic bladder   Recurrent UTI   Obesity   AKI (acute kidney injury) ([place])   Hypokalemia   Chronic pain syndrome   Sepsis (Tracey Hall)  #1 Sepsis  2/2 to Urinary Tract Infection  Bilateral Hydronephrosis 2/2 Neurogenic Bladder: Secondary to pyelonephritis.  R sided CVA tenderness.  Cefepime -> narrow to ceftriaxone  Follow blood and urine cultures (with citrobacter kosari and providencia stuartii - follow sensitivities) Renal US with bladder distension and hydro -> foley has been placed.  Start flomax.  Will need outpatient follow up with urology.  May need chronic indwelling catheter.  Discussed with urology.   Repeat renal US tomorrow 9/1 Procalcitonin increased?, but WBC count has improved and fevers have improved - follow procalcitonin Pt very anxious - have requested urology to see her as well - appreciate recs - planning urodynamics in the office - will make decision on intermittent catheterization vs suprapubic vs indwelling foley.    # Hypotension: improved, continue to monitor. Cortisol low this AM, cort stim wnl  #8 acute kidney injury: baseline creatinine ~0.46.  1.38 on presentation. Continues to improve.  Follow s/p foley catheter placement, transfusion, and IVF.  UA consistent with UTI with RBC's and WBC's.  No protein.  # NAGMA: continue to monitor  #3 anemia: No obvious source.  Denies bleeding. Follow iron panel (with evidence of iron def), b12 (wnl), folate (wnl).  Hemoccult pending.  Start PO iron Hold off on IV iron in setting of infection She'll need further w/u for iron def anemia outpatient   #4 quadriplegia: per discussion with nursing, no decubitus ulcers noted  #5 hypokalemia: improved, follow  #6 anxiety: she denies depression sx,  but notes anxiety and what she describes of as anger issues as well.   Follow EKG for repeat qtc (initial prolonged) Give clonazepam x 1.  Start atarax. If QTc is appropriate, plan for cymbalta for anxiety (as well as benefit for chronic pain)  #7 chronic pain syndrome: Resume home regimen  # Headache: give fioricet x1 and follow.   DVT prophylaxis: SCD Code Status:  full  Family Communication: sig other at bedside Disposition Plan: pending further improvement   Consultants:   Urology over phone   Procedures:   none  Antimicrobials:  Anti-infectives (From admission, onward)   Start     Dose/Rate Route Frequency Ordered Stop   02/20/19 1300  cefTRIAXone (ROCEPHIN) 1 g in sodium chloride 0.9 % 100 mL IVPB     1 g 200 mL/hr over 30 Minutes Intravenous Every 24 hours 02/20/19 1155     02/18/19 1300  ceFEPIme (MAXIPIME) 2 g in sodium chloride 0.9 % 100 mL IVPB  Status:  Discontinued     2 g 200 mL/hr over 30 Minutes Intravenous Every 8 hours 02/18/19 0323 02/20/19 1155   02/18/19 0215  ceFEPIme (MAXIPIME) 2 g in sodium chloride 0.9 % 100 mL IVPB     2 g 200 mL/hr over 30 Minutes Intravenous  Once 02/18/19 0208 02/18/19 0359   02/17/19 2145  cefTRIAXone (ROCEPHIN) 1 g in sodium chloride 0.9 % 100 mL IVPB  Status:  Discontinued     1 g 200 mL/hr over 30 Minutes Intravenous Every 24 hours 02/17/19 2142 02/18/19 0323         Subjective: C/o HA today Expresses anxiety about everything going on Had always been told to avoid foley  This makes her uneasy  Objective: Vitals:   02/19/19 1858 02/19/19 2116 02/20/19 0610 02/20/19 1500  BP: (!) 115/93 132/83 (!) 141/90 (!) 163/99  Pulse: (!) 105 95 82 82  Resp: 16 16 16 18   Temp: 99.4 F (37.4 C) 99.2 F (37.3 C) 98.4 F (36.9 C) 98.2 F (36.8 C)  TempSrc: Oral Oral Oral Oral  SpO2: 100% 100% 96% 97%  Weight:      Height:        Intake/Output Summary (Last 24 hours) at 02/20/2019 1629 Last data filed at 02/20/2019 1500 Gross per 24 hour  Intake 3839.07 ml  Output 5600 ml  Net -1760.93 ml   Filed Weights   02/17/19 2112  Weight: 90.7 kg    Examination:  General: No acute distress. Cardiovascular: Heart sounds show Spike Desilets regular rate, and rhythm Lungs: Clear to auscultation bilaterally Abdomen: Soft, nontender, nondistended  Neurological: Alert and oriented 3. Moves all  extremities 4. Cranial nerves II through XII grossly intact. Skin: Warm and dry. No rashes or lesions. Extremities: contractures  .     Data Reviewed: I have personally reviewed following labs and imaging studies  CBC: Recent Labs  Lab 02/17/19 2200 02/19/19 0220 02/20/19 0720  WBC 22.2* 11.1* 6.7  NEUTROABS 19.5*  --   --   HGB 6.0* 7.5* 7.7*  HCT 21.3* 25.4* 26.1*  MCV 68.1* 74.5* 73.7*  PLT 515* 401* [id number]   Basic Metabolic Panel: Recent Labs  Lab 02/17/19 2200 02/18/19 1647 02/19/19 0220 02/20/19 0720  NA 133* 138 137 137  K 2.8* 3.2* 3.8 3.3*  CL 102 109 109 104  CO2 19* 19* 18* 24  GLUCOSE 120* 89 92 108*  BUN 19 10 9 10   CREATININE 1.38* 0.77 0.81 0.67  CALCIUM 8.3* 7.8* 7.9*  8.3*  MG  --   --  1.7 1.6*   GFR: Estimated Creatinine Clearance: 109.3 mL/min (by C-G formula based on SCr of 0.67 mg/dL). Liver Function Tests: Recent Labs  Lab 02/17/19 2200 02/18/19 1647 02/19/19 0220 02/20/19 0720  AST 19 14* 17 16  ALT 14 13 15 18   ALKPHOS 65 66 64 74  BILITOT 0.2* 0.5 0.5 0.2*  PROT 7.1 6.6 6.3* 6.4*  ALBUMIN 3.3* 2.9* 2.9* 2.8*   No results for input(s): LIPASE, AMYLASE in the last 168 hours. No results for input(s): AMMONIA in the last 168 hours. Coagulation Profile: Recent Labs  Lab 02/17/19 2200 02/18/19 1647  INR 1.3* 1.2   Cardiac Enzymes: No results for input(s): CKTOTAL, CKMB, CKMBINDEX, TROPONINI in the last 168 hours. BNP (last 3 results) No results for input(s): PROBNP in the last 8760 hours. HbA1C: No results for input(s): HGBA1C in the last 72 hours. CBG: No results for input(s): GLUCAP in the last 168 hours. Lipid Profile: No results for input(s): CHOL, HDL, LDLCALC, TRIG, CHOLHDL, LDLDIRECT in the last 72 hours. Thyroid Function Tests: No results for input(s): TSH, T4TOTAL, FREET4, T3FREE, THYROIDAB in the last 72 hours. Anemia Panel: Recent Labs    02/18/19 1648 02/19/19 1242  VITAMINB12 413  --   FOLATE  --  8.7   FERRITIN 54  --   TIBC  --  309  IRON  --  <5*   Sepsis Labs: Recent Labs  Lab 02/17/19 2158 02/17/19 2326 02/18/19 1647 02/19/19 0220 02/20/19 0720  PROCALCITON  --   --  21.95 15.24 27.35  LATICACIDVEN 3.8* 1.5  --   --   --     Recent Results (from the past 240 hour(s))  Urine culture     Status: Abnormal (Preliminary result)   Collection Time: 02/17/19  9:38 PM   Specimen: In/Out Cath Urine  Result Value Ref Range Status   Specimen Description   Final    IN/OUT CATH URINE Performed at [facility] [place] [address] [place], [place] [address]    Special Requests   Final    NONE Performed at [facility], [place] [address] [place], [place] [address]    Culture (Ripken Rekowski)  Final    >=100,000 COLONIES/mL CITROBACTER KOSERI >=100,000 COLONIES/mL PROVIDENCIA STUARTII CULTURE REINCUBATED FOR BETTER GROWTH Performed at [facility], [place] [address], [place], [place] [address]    Report Status PENDING  Incomplete  Culture, blood (Routine x 2)     Status: None (Preliminary result)   Collection Time: 02/17/19  9:58 PM   Specimen: BLOOD  Result Value Ref Range Status   Specimen Description   Final    BLOOD LEFT ANTECUBITAL Performed at [place] [address] [place], [place] [address]    Special Requests   Final    BOTTLES DRAWN AEROBIC AND ANAEROBIC Blood Culture results may not be optimal due to an excessive volume of blood received in culture bottles Performed at [place] [address] [place], [place] [address]    Culture   Final    NO GROWTH 3 DAYS Performed at [facility], [place] [address], [place], [place] [address]    Report Status PENDING  Incomplete  Culture, blood (Routine x 2)     Status: None (Preliminary result)   Collection Time: 02/17/19 10:00 PM   Specimen: BLOOD LEFT HAND  Result Value Ref Range Status   Specimen Description  Final    BLOOD LEFT HAND  Performed at [facility], [place] [address], [place], [place] [address]    Special Requests   Final    BOTTLES DRAWN AEROBIC ONLY Blood Culture adequate volume Performed at [place] [address], [place], [place] [address]    Culture   Final    NO GROWTH 3 DAYS Performed at [facility], [place] [address] [place], [place] [address]    Report Status PENDING  Incomplete  SARS Coronavirus 2 [facility] order, Performed in [facility] hospital lab) Nasopharyngeal Nasopharyngeal Swab     Status: None   Collection Time: 02/17/19 11:04 PM   Specimen: Nasopharyngeal Swab  Result Value Ref Range Status   SARS Coronavirus 2 NEGATIVE NEGATIVE Final    Comment: (NOTE) If result is NEGATIVE SARS-CoV-2 target nucleic acids are NOT DETECTED. The SARS-CoV-2 RNA is generally detectable in upper and lower  respiratory specimens during the acute phase of infection. The lowest  concentration of SARS-CoV-2 viral copies this assay can detect is 250  copies / mL. Maily Debarge negative result does not preclude SARS-CoV-2 infection  and should not be used as the sole basis for treatment or other  patient management decisions.  Shawon Denzer negative result may occur with  improper specimen collection / handling, submission of specimen other  than nasopharyngeal swab, presence of viral mutation(s) within the  areas targeted by this assay, and inadequate number of viral copies  (<250 copies / mL). Malloree Raboin negative result must be combined with clinical  observations, patient history, and epidemiological information. If result is POSITIVE SARS-CoV-2 target nucleic acids are DETECTED. The SARS-CoV-2 RNA is generally detectable in upper and lower  respiratory specimens dur ing the acute phase of infection.  Positive  results are indicative of active infection with SARS-CoV-2.  Clinical  correlation with patient history and other diagnostic information is  necessary to determine patient  infection status.  Positive results do  not rule out bacterial infection or co-infection with other viruses. If result is PRESUMPTIVE POSTIVE SARS-CoV-2 nucleic acids MAY BE PRESENT.   Sharmayne Jablon presumptive positive result was obtained on the submitted specimen  and confirmed on repeat testing.  While 2019 novel coronavirus  (SARS-CoV-2) nucleic acids may be present in the submitted sample  additional confirmatory testing may be necessary for epidemiological  and / or clinical management purposes  to differentiate between  SARS-CoV-2 and other Sarbecovirus currently known to infect humans.  If clinically indicated additional testing with an alternate test  methodology [phone]) is advised. The SARS-CoV-2 RNA is generally  detectable in upper and lower respiratory sp ecimens during the acute  phase of infection. The expected result is Negative. Fact Sheet for Patients:  StrictlyIdeas.no Fact Sheet for Healthcare Providers: BankingDealers.co.za This test is not yet approved or cleared by the [place] FDA and has been authorized for detection and/or diagnosis of SARS-CoV-2 by FDA under an Emergency Use Authorization (EUA).  This EUA will remain in effect (meaning this test can be used) for the duration of the COVID-19 declaration under Section 564(b)(1) of the Act, 21 U.S.C. section 360bbb-3(b)(1), unless the authorization is terminated or revoked sooner. Performed at [facility], [place] [address], [place], [place] [address]          Radiology Studies: US Renal  Result Date: 02/20/2019 CLINICAL DATA:  Follow-up hydronephrosis EXAM: RENAL / URINARY TRACT ULTRASOUND COMPLETE COMPARISON:  02/18/19 FINDINGS: Right Kidney: Renal measurements: 15.1 x 4.9 x  7.8 cm = volume: 297 mL . Echogenicity within normal limits. No mass or hydronephrosis visualized. Left Kidney: Renal measurements: 10.3 x 5.5 x 5.6 cm. = volume: 163 mL.  Echogenicity within normal limits. No mass or hydronephrosis visualized. Bladder: Decompressed by Foley catheter. IMPRESSION: Previously seen hydronephrosis has resolved following decompression of the bladder. No other focal abnormality is noted. Electronically Signed   By: Inez Catalina M.D.   On: 02/20/2019 13:03        Scheduled Meds: . Chlorhexidine Gluconate Cloth  6 each Topical Daily  . tamsulosin  0.4 mg Oral QPC supper   Continuous Infusions: . cefTRIAXone (ROCEPHIN)  IV 1 g (02/20/19 1446)     LOS: 2 days    Time spent: over 30 min   Fayrene Helper, MD [facility] Pager AMION  If 7PM-7AM, please contact night-coverage [email or web address] Password [facility] 02/20/2019, 4:29 PM

## 2019-02-20 NOTE — Progress Notes (Signed)
PHARMACY NOTE -  Cefepime  Pharmacy has been assisting with dosing of Cefepim for urosepsis.  Dosage remains stable at 2g IV q8 hr and need for further dosage adjustment appears unlikely at present given stable SCr  Pharmacy will sign off, following peripherally for culture results or dose adjustments. Please reconsult if a change in clinical status warrants re-evaluation of dosage.  Reuel Boom, PharmD, BCPS [phone] 02/20/2019, 11:33 AM
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
## 2019-02-21 ENCOUNTER — Inpatient Hospital Stay (HOSPITAL_COMMUNITY): Payer: Medicare Other

## 2019-02-21 LAB — CBC
HCT: 26.3 % — ABNORMAL LOW (ref 36.0–46.0)
Hemoglobin: 7.9 g/dL — ABNORMAL LOW (ref 12.0–15.0)
MCH: 21.9 pg — ABNORMAL LOW (ref 26.0–34.0)
MCHC: 30 g/dL (ref 30.0–36.0)
MCV: 73.1 fL — ABNORMAL LOW (ref 80.0–100.0)
Platelets: 383 10*3/uL (ref 150–400)
RBC: 3.6 MIL/uL — ABNORMAL LOW (ref 3.87–5.11)
RDW: 23.9 % — ABNORMAL HIGH (ref 11.5–15.5)
WBC: 9 10*3/uL (ref 4.0–10.5)
nRBC: 0 % (ref 0.0–0.2)

## 2019-02-21 LAB — COMPREHENSIVE METABOLIC PANEL
ALT: 19 U/L (ref 0–44)
AST: 22 U/L (ref 15–41)
Albumin: 2.9 g/dL — ABNORMAL LOW (ref 3.5–5.0)
Alkaline Phosphatase: 70 U/L (ref 38–126)
Anion gap: 11 (ref 5–15)
BUN: 7 mg/dL (ref 6–20)
CO2: 23 mmol/L (ref 22–32)
Calcium: 8.3 mg/dL — ABNORMAL LOW (ref 8.9–10.3)
Chloride: 101 mmol/L (ref 98–111)
Creatinine, Ser: 0.64 mg/dL (ref 0.44–1.00)
GFR calc Af Amer: >60 mL/min (ref >60)
GFR calc non Af Amer: >60 mL/min (ref >60)
Glucose, Bld: 111 mg/dL — ABNORMAL HIGH (ref 70–99)
Potassium: 3.4 mmol/L — ABNORMAL LOW (ref 3.5–5.1)
Sodium: 135 mmol/L (ref 135–145)
Total Bilirubin: 0.2 mg/dL — ABNORMAL LOW (ref 0.3–1.2)
Total Protein: 6.6 g/dL (ref 6.5–8.1)

## 2019-02-21 LAB — MAGNESIUM: Magnesium: 2.1 mg/dL (ref 1.7–2.4)

## 2019-02-21 LAB — WET PREP, GENITAL
Sperm: NONE SEEN
Yeast Wet Prep HPF POC: NONE SEEN

## 2019-02-21 LAB — URINE CULTURE: Culture: >=100000 — AB

## 2019-02-21 LAB — PROCALCITONIN: Procalcitonin: 13.8 ng/mL

## 2019-02-21 LAB — PREGNANCY, URINE: Preg Test, Ur: NEGATIVE

## 2019-02-21 MED ORDER — IOHEXOL 300 MG/ML  SOLN: 100.0000 mL | Freq: Once | INTRAMUSCULAR | Status: DC | PRN

## 2019-02-21 MED ORDER — POLYETHYLENE GLYCOL 3350 17 G PO PACK
17.0000 g | PACK | Freq: Every day | ORAL | Status: DC
  Filled 2019-02-21: qty 1

## 2019-02-21 MED ORDER — METRONIDAZOLE 500 MG PO TABS
500.0000 mg | ORAL_TABLET | Freq: Two times a day (BID) | ORAL | Status: DC
  Administered 2019-02-22 – 2019-02-23 (×3): 500 mg via ORAL
  Filled 2019-02-21 (×4): qty 1

## 2019-02-21 MED ORDER — POTASSIUM CHLORIDE CRYS ER 20 MEQ PO TBCR
40.0000 meq | EXTENDED_RELEASE_TABLET | Freq: Once | ORAL | Status: AC
  Administered 2019-02-21: 40 meq via ORAL
  Filled 2019-02-21: qty 2

## 2019-02-21 MED ORDER — ACETAMINOPHEN 325 MG PO TABS
325.0000 mg | ORAL_TABLET | Freq: Four times a day (QID) | ORAL | Status: DC | PRN
  Administered 2019-02-21: 325 mg via ORAL
  Filled 2019-02-21: qty 1

## 2019-02-21 MED ORDER — MORPHINE SULFATE (PF) 2 MG/ML IV SOLN: 2.0000 mg | Freq: Once | INTRAVENOUS | Status: DC | PRN

## 2019-02-21 MED ORDER — MORPHINE SULFATE (PF) 2 MG/ML IV SOLN
2.0000 mg | INTRAVENOUS | Status: DC | PRN
  Administered 2019-02-21: 2 mg via INTRAVENOUS
  Filled 2019-02-21: qty 1

## 2019-02-21 MED ORDER — IOHEXOL 300 MG/ML  SOLN
30.0000 mL | Freq: Once | INTRAMUSCULAR | Status: DC | PRN
  Administered 2019-02-21: 30 mL via ORAL
  Filled 2019-02-21: qty 30

## 2019-02-21 MED ORDER — DULOXETINE HCL 30 MG PO CPEP
30.0000 mg | ORAL_CAPSULE | Freq: Every day | ORAL | Status: DC
  Administered 2019-02-21 – 2019-02-23 (×3): 30 mg via ORAL
  Filled 2019-02-21 (×3): qty 1

## 2019-02-21 NOTE — Care Management Important Message (Signed)
Important Message  Patient Details IM Letter given to [name] SW to present to the Patient  Name: Tracey Hall MRN: [id number] Date of Birth: 1980/08/04   Medicare Important Message Given:  Yes     Kerin Salen 02/21/2019, 9:48 AM

## 2019-02-21 NOTE — Progress Notes (Signed)
This RN noticed Pt had a moderate amount of  yellow/green mucus discharge/ drainage coming from perineum area while providing peri/ foley care.
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
## 2019-02-21 NOTE — Progress Notes (Addendum)
PROGRESS NOTE    Tracey Hall  [id number] DOB: 07/30/1980 DOA: 02/17/2019 PCP: Tracey Fee, DO   Brief Narrative:  Tracey Hall is Tracey Hall 38 y.o. female with medical history significant of quadriplegia and neurogenic bladder, recurrent pyelonephritis, autonomic dysfunction, morbid obesity who presented to the ER with generalized weakness fever chills and weakness.  She was found to have Tracey Hall UTI with bilateral hydro due to neurogenic bladder.  Foley catheter was placed and she's improved with antibiotics.  Pending possible CIR.    Assessment & Plan:   Principal Problem:   Sepsis due to urinary tract infection (Tracey Hall) Active Problems:   Major depressive disorder, recurrent episode (Tracey Hall)   Anxiety state   Quadriplegia following spinal cord injury (Tracey Hall)   Neurogenic bladder   Recurrent UTI   Obesity   AKI (acute kidney injury) (Tracey Hall)   Hypokalemia   Chronic pain syndrome   Sepsis (Tracey Hall)  Addendum: Worsening abdominal pain this PM.  CT AP obtained.  Analgesia ordered.  #1 Sepsis 2/2 to Urinary Tract Infection  Bilateral Hydronephrosis 2/2 Neurogenic Bladder: Secondary to pyelonephritis.  R sided CVA tenderness.  Cefepime -> narrow to ceftriaxone - will need 7 day course of abx Follow blood and urine cultures (with citrobacter kosari and providencia stuartii as well as viridans strep - will plan for d/c on levoquin to cover all 3 when ready for d/c - discussed with pharmacy) Renal US with bladder distension and hydro -> foley has been placed.  Start flomax.  Will need outpatient follow up with urology.  May need chronic indwelling catheter.  Discussed with urology.   Repeat renal US with improved hydro Procalcitonin increased?, but WBC count has improved and fevers have improved - follow procalcitonin Pt very anxious - have requested urology to see her as well - appreciate recs - planning urodynamics in the office - will make decision on intermittent catheterization vs suprapubic  vs indwelling foley.    # Vaginal Discharge  Trichomoniasis  Bacterial Vaginosis: 7 days of flagyl 500 mg BID.  Will discuss with pt need to have significant other treated as well. Negative HIV, follow RPR Follow GC/chlamydia swabs  # Hypotension: resolved, continue to monitor. Cortisol low this AM, cort stim wnl  #8 acute kidney injury: baseline creatinine ~0.46.  1.38 on presentation. Continues to improve.  Follow s/p foley catheter placement, transfusion, and IVF.  UA consistent with UTI with RBC's and WBC's.  No protein.  # NAGMA: continue to monitor  #3 anemia: No obvious source.  Denies bleeding. Follow iron panel (with evidence of iron def), b12 (wnl), folate (wnl).  Hemoccult pending.  Start PO iron Hold off on IV iron in setting of infection She'll need further w/u for iron def anemia outpatient   #4 quadriplegia: PT/OT recommending CIR - order placed.  Will await CIR evaluation  #5 hypokalemia: improved, follow  #6 anxiety: she denies depression sx, but notes anxiety and what she describes of as anger issues as well.   Follow EKG for repeat qtc (initial prolonged) Give clonazepam x 1.  Start atarax. QTc 471, will start cymbalta 30 mg daily for anxiety (as well as benefit for chronic pain) Have discussed need for outpatient follow up with psych/behavioral heath specialist  #7 chronic pain syndrome: Resume home regimen  # Headache: seems improved, continue to monitor  DVT prophylaxis: SCD Code Status: full  Family Communication: sig other at bedside Disposition Plan: pending further improvement   Consultants:   Urology over phone  Procedures:   none  Antimicrobials:  Anti-infectives (From admission, onward)   Start     Dose/Rate Route Frequency Ordered Stop   02/21/19 1600  metroNIDAZOLE (FLAGYL) tablet 500 mg     500 mg Oral 2 times daily 02/21/19 1436 02/28/19 2159   02/20/19 1300  cefTRIAXone (ROCEPHIN) 1 g in sodium chloride 0.9 % 100 mL IVPB      1 g 200 mL/hr over 30 Minutes Intravenous Every 24 hours 02/20/19 1155     02/18/19 1300  ceFEPIme (MAXIPIME) 2 g in sodium chloride 0.9 % 100 mL IVPB  Status:  Discontinued     2 g 200 mL/hr over 30 Minutes Intravenous Every 8 hours 02/18/19 0323 02/20/19 1155   02/18/19 0215  ceFEPIme (MAXIPIME) 2 g in sodium chloride 0.9 % 100 mL IVPB     2 g 200 mL/hr over 30 Minutes Intravenous  Once 02/18/19 0208 02/18/19 0359   02/17/19 2145  cefTRIAXone (ROCEPHIN) 1 g in sodium chloride 0.9 % 100 mL IVPB  Status:  Discontinued     1 g 200 mL/hr over 30 Minutes Intravenous Every 24 hours 02/17/19 2142 02/18/19 0323         Subjective: Interested in rehab if possible Feeling gradually better  Objective: Vitals:   02/20/19 2101 02/21/19 0243 02/21/19 0527 02/21/19 1421  BP: (!) 128/111 107/74  (!) 144/93  Pulse: (!) 59 97  (!) 105  Resp: 20 18  20   Temp: 98.5 F (36.9 C) 99.4 F (37.4 C) 100.3 F (37.9 C) 99.1 F (37.3 C)  TempSrc: Oral Oral Oral Oral  SpO2: 100% 99%  99%  Weight:      Height:        Intake/Output Summary (Last 24 hours) at 02/21/2019 1654 Last data filed at 02/21/2019 0317 Gross per 24 hour  Intake 941.09 ml  Output -  Net 941.09 ml   Filed Weights   02/17/19 2112  Weight: 90.7 kg    Examination:  General: No acute distress. Cardiovascular: Heart sounds show Tracey Hall regular rate, and rhythm.  Lungs: Clear to auscultation bilaterally  Abdomen: Soft, nontender, nondistended  GU: nurse at bedside while external vaginal exam performed, vaginal swabs collected (greenish discharge).  Foley in place. Neurological: Alert and oriented 3. Moves all extremities 4. Cranial nerves II through XII grossly intact. Skin: Warm and dry. No rashes or lesions. Extremities: contractures  .     Data Reviewed: I have personally reviewed following labs and imaging studies  CBC: Recent Labs  Lab 02/17/19 2200 02/18/19 1647 02/19/19 0220 02/20/19 0720 02/21/19 0459  WBC  22.2*  --  11.1* 6.7 9.0  NEUTROABS 19.5*  --   --   --   --   HGB 6.0*  --  7.5* 7.7* 7.9*  HCT 21.3* 26.3* 25.4* 26.1* 26.3*  MCV 68.1*  --  74.5* 73.7* 73.1*  PLT 515*  --  401* 400 [id number]   Basic Metabolic Panel: Recent Labs  Lab 02/17/19 2200 02/18/19 1647 02/19/19 0220 02/20/19 0720 02/21/19 0459  NA 133* 138 137 137 135  K 2.8* 3.2* 3.8 3.3* 3.4*  CL 102 109 109 104 101  CO2 19* 19* 18* 24 23  GLUCOSE 120* 89 92 108* 111*  BUN 19 10 9 10 7   CREATININE 1.38* 0.77 0.81 0.67 0.64  CALCIUM 8.3* 7.8* 7.9* 8.3* 8.3*  MG  --   --  1.7 1.6* 2.1   GFR: Estimated Creatinine Clearance: 109.3 mL/min (by C-G formula  based on SCr of 0.64 mg/dL). Liver Function Tests: Recent Labs  Lab 02/17/19 2200 02/18/19 1647 02/19/19 0220 02/20/19 0720 02/21/19 0459  AST 19 14* 17 16 22   ALT 14 13 15 18 19   ALKPHOS 65 66 64 74 70  BILITOT 0.2* 0.5 0.5 0.2* 0.2*  PROT 7.1 6.6 6.3* 6.4* 6.6  ALBUMIN 3.3* 2.9* 2.9* 2.8* 2.9*   No results for input(s): LIPASE, AMYLASE in the last 168 hours. No results for input(s): AMMONIA in the last 168 hours. Coagulation Profile: Recent Labs  Lab 02/17/19 2200 02/18/19 1647  INR 1.3* 1.2   Cardiac Enzymes: No results for input(s): CKTOTAL, CKMB, CKMBINDEX, TROPONINI in the last 168 hours. BNP (last 3 results) No results for input(s): PROBNP in the last 8760 hours. HbA1C: No results for input(s): HGBA1C in the last 72 hours. CBG: No results for input(s): GLUCAP in the last 168 hours. Lipid Profile: No results for input(s): CHOL, HDL, LDLCALC, TRIG, CHOLHDL, LDLDIRECT in the last 72 hours. Thyroid Function Tests: No results for input(s): TSH, T4TOTAL, FREET4, T3FREE, THYROIDAB in the last 72 hours. Anemia Panel: Recent Labs    02/19/19 1242  FOLATE 8.7  TIBC 309  IRON <5*   Sepsis Labs: Recent Labs  Lab 02/17/19 2158 02/17/19 2326 02/18/19 1647 02/19/19 0220 02/20/19 0720 02/21/19 0459  PROCALCITON  --   --  21.95 15.24 27.35  13.80  LATICACIDVEN 3.8* 1.5  --   --   --   --     Recent Results (from the past 240 hour(s))  Urine culture     Status: Abnormal   Collection Time: 02/17/19  9:38 PM   Specimen: In/Out Cath Urine  Result Value Ref Range Status   Specimen Description   Final    IN/OUT CATH URINE Performed at [facility], [place] [address] [place], [place] [address]    Special Requests   Final    NONE Performed at [facility], [place] [address], [place], [place] [address]    Culture (Areg Bialas)  Final    >=100,000 COLONIES/mL CITROBACTER KOSERI >=100,000 COLONIES/mL PROVIDENCIA STUARTII 80,000 COLONIES/mL VIRIDANS STREPTOCOCCUS Standardized susceptibility testing for this organism is not available. Performed at [facility], [place] [address], [place],  [address]    Report Status 02/21/2019 FINAL  Final   Organism ID, Bacteria CITROBACTER KOSERI (Arthor Gorter)  Final   Organism ID, Bacteria PROVIDENCIA STUARTII (Ananiah Maciolek)  Final      Susceptibility   Citrobacter koseri - MIC*    CEFAZOLIN <=4 SENSITIVE Sensitive     CEFTRIAXONE <=1 SENSITIVE Sensitive     CIPROFLOXACIN <=0.25 SENSITIVE Sensitive     GENTAMICIN <=1 SENSITIVE Sensitive     IMIPENEM <=0.25 SENSITIVE Sensitive     NITROFURANTOIN <=16 SENSITIVE Sensitive     TRIMETH/SULFA <=20 SENSITIVE Sensitive     PIP/TAZO <=4 SENSITIVE Sensitive     * >=100,000 COLONIES/mL CITROBACTER KOSERI   Providencia stuartii - MIC*    AMPICILLIN RESISTANT Resistant     CEFAZOLIN >=64 RESISTANT Resistant     CEFTRIAXONE <=1 SENSITIVE Sensitive     CIPROFLOXACIN <=0.25 SENSITIVE Sensitive     GENTAMICIN RESISTANT Resistant     IMIPENEM 1 SENSITIVE Sensitive     NITROFURANTOIN 256 RESISTANT Resistant     TRIMETH/SULFA <=20 SENSITIVE Sensitive     AMPICILLIN/SULBACTAM 16 INTERMEDIATE Intermediate     PIP/TAZO <=4 SENSITIVE Sensitive     * >=100,000 COLONIES/mL PROVIDENCIA STUARTII  Culture, blood (Routine x  2)     Status: None  (Preliminary result)   Collection Time: 02/17/19  9:58 PM   Specimen: BLOOD  Result Value Ref Range Status   Specimen Description   Final    BLOOD LEFT ANTECUBITAL Performed at [facility] [address], [place], [place] [address]    Special Requests   Final    BOTTLES DRAWN AEROBIC AND ANAEROBIC Blood Culture results may not be optimal due to an excessive volume of blood received in culture bottles Performed at [facility] [address], [place], [place] [address]    Culture   Final    NO GROWTH 4 DAYS Performed at [facility], [place] [address] [place], [place] [address]    Report Status PENDING  Incomplete  Culture, blood (Routine x 2)     Status: None (Preliminary result)   Collection Time: 02/17/19 10:00 PM   Specimen: BLOOD LEFT HAND  Result Value Ref Range Status   Specimen Description   Final    BLOOD LEFT HAND Performed at [facility] [address], [place], [place] [address]    Special Requests   Final    BOTTLES DRAWN AEROBIC ONLY Blood Culture adequate volume Performed at [facility] [address] [place], [place] [address]    Culture   Final    NO GROWTH 4 DAYS Performed at [facility], [place] [address], [place],  [address]    Report Status PENDING  Incomplete  SARS Coronavirus 2 [facility] order, Performed in [facility] hospital lab) Nasopharyngeal Nasopharyngeal Swab     Status: None   Collection Time: 02/17/19 11:04 PM   Specimen: Nasopharyngeal Swab  Result Value Ref Range Status   SARS Coronavirus 2 NEGATIVE NEGATIVE Final    Comment: (NOTE) If result is NEGATIVE SARS-CoV-2 target nucleic acids are NOT DETECTED. The SARS-CoV-2 RNA is generally detectable in upper and lower  respiratory specimens during the acute phase of infection. The lowest  concentration of SARS-CoV-2 viral copies this assay can detect is 250  copies / mL. Illana Nolting negative result does  not preclude SARS-CoV-2 infection  and should not be used as the sole basis for treatment or other  patient management decisions.  Karlei Waldo negative result may occur with  improper specimen collection / handling, submission of specimen other  than nasopharyngeal swab, presence of viral mutation(s) within the  areas targeted by this assay, and inadequate number of viral copies  (<250 copies / mL). Katelynne Revak negative result must be combined with clinical  observations, patient history, and epidemiological information. If result is POSITIVE SARS-CoV-2 target nucleic acids are DETECTED. The SARS-CoV-2 RNA is generally detectable in upper and lower  respiratory specimens dur ing the acute phase of infection.  Positive  results are indicative of active infection with SARS-CoV-2.  Clinical  correlation with patient history and other diagnostic information is  necessary to determine patient infection status.  Positive results do  not rule out bacterial infection or co-infection with other viruses. If result is PRESUMPTIVE POSTIVE SARS-CoV-2 nucleic acids MAY BE PRESENT.   Danaly Bari presumptive positive result was obtained on the submitted specimen  and confirmed on repeat testing.  While 2019 novel coronavirus  (SARS-CoV-2) nucleic acids may be present in the submitted sample  additional confirmatory testing may be necessary for epidemiological  and / or clinical management purposes  to differentiate between  SARS-CoV-2 and other Sarbecovirus currently known to infect humans.  If  clinically indicated additional testing with an alternate test  methodology [phone]) is advised. The SARS-CoV-2 RNA is generally  detectable in upper and lower respiratory sp ecimens during the acute  phase of infection. The expected result is Negative. Fact Sheet for Patients:  StrictlyIdeas.no Fact Sheet for Healthcare Providers: BankingDealers.co.za This test is not yet approved or  cleared by the [place] FDA and has been authorized for detection and/or diagnosis of SARS-CoV-2 by FDA under an Emergency Use Authorization (EUA).  This EUA will remain in effect (meaning this test can be used) for the duration of the COVID-19 declaration under Section 564(b)(1) of the Act, 21 U.S.C. section 360bbb-3(b)(1), unless the authorization is terminated or revoked sooner. Performed at [facility], [place] [address], [place], [place] [address]   Wet prep, genital     Status: Abnormal   Collection Time: 02/21/19 12:55 PM  Result Value Ref Range Status   Yeast Wet Prep HPF POC NONE SEEN NONE SEEN Final   Trich, Wet Prep PRESENT (Shaely Gadberry) NONE SEEN Final   Clue Cells Wet Prep HPF POC PRESENT (Angeliz Settlemyre) NONE SEEN Final   WBC, Wet Prep HPF POC MANY (Boss Danielsen) NONE SEEN Final   Sperm NONE SEEN  Final    Comment: Performed at [facility], [place] [address], [place],  [address]         Radiology Studies: US Renal  Result Date: 02/20/2019 CLINICAL DATA:  Follow-up hydronephrosis EXAM: RENAL / URINARY TRACT ULTRASOUND COMPLETE COMPARISON:  02/18/19 FINDINGS: Right Kidney: Renal measurements: 15.1 x 4.9 x 7.8 cm = volume: 297 mL . Echogenicity within normal limits. No mass or hydronephrosis visualized. Left Kidney: Renal measurements: 10.3 x 5.5 x 5.6 cm. = volume: 163 mL. Echogenicity within normal limits. No mass or hydronephrosis visualized. Bladder: Decompressed by Foley catheter. IMPRESSION: Previously seen hydronephrosis has resolved following decompression of the bladder. No other focal abnormality is noted. Electronically Signed   By: Inez Catalina M.D.   On: 02/20/2019 13:03        Scheduled Meds: . Chlorhexidine Gluconate Cloth  6 each Topical Daily  . DULoxetine  30 mg Oral Daily  . ferrous sulfate  325 mg Oral Q breakfast  . metroNIDAZOLE  500 mg Oral BID  . tamsulosin  0.4 mg Oral QPC supper   Continuous Infusions: . cefTRIAXone (ROCEPHIN)   IV 1 g (02/21/19 1512)     LOS: 3 days    Time spent: over 30 min   Fayrene Helper, MD [facility] Pager AMION  If 7PM-7AM, please contact night-coverage [email or web address] Password [facility] 02/21/2019, 4:54 PM

## 2019-02-21 NOTE — Evaluation (Signed)
Physical Therapy Evaluation Patient Details Name: Tracey Hall MRN: [id number] DOB: 1981/03/03 Today's Date: 02/21/2019   History of Present Illness  38 yo female admitted to ED on 8/28 with UTI sepsis, pt now with foley catheter placed. 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 PMH includes UTI sepsis, pyelonephritis, incotinence, chronic pain, obesity, anxiety, depression.  Clinical Impression   Pt presents with LE spasms and paraplegia, UE weakness L>R related to chronic SCI, periods of severe body-wide pain, difficulty performing all mobility tasks including transfers, fair static sitting trunk control with deficits noted during dynamic EOB tasks, impaired skin integrity, decreased activity tolerance, and decreased knowledge of appropriate technique for mobility with SCI. Pt to benefit from acute PT to address deficits. Pt required mod assist +2 for bed mobility, pt limited by LE spasticity into flexion with mobility. Pt able to scoot at EOB with head-hips rule and mod assist from PT. Pt has no history of inpatient rehab in the past per pt, as she was 46 when she acquired her SCI and pt states "I was too angry". Now, pt is requiring total assist for all her transfers and mobility at home, and would like be able to transfer to and from her wheelchair with less assist, and be as independent as possible with her chronic SCI. PT strongly recommending CIR to address mobility needs, OT consult placed by PT.  PT to progress mobility as tolerated, and will continue to follow acutely.      Follow Up Recommendations CIR    Equipment Recommendations  Other (comment)(defer)    Recommendations for Other Services OT consult     Precautions / Restrictions Precautions Precautions: Fall Restrictions Weight Bearing Restrictions: No      Mobility  Bed Mobility Overal bed mobility: Needs Assistance Bed Mobility: Supine to Sit;Sit to Supine      Supine to sit: Mod assist;+2 for safety/equipment;HOB elevated Sit to supine: Mod assist;+2 for safety/equipment;HOB elevated   General bed mobility comments: Mod assist +2 for safety for supine<>sit for LE lifting and translation to/from EOB, trunk elevation/lowering. Pt with use of wrist extension bilaterally R>L under LEs to lift and assist in placing LEs during moving to EOB. Pt with UE propping in sitting, requiring no external support from PT to maintain balance. Mod assist for lateral scooting to L with assist of bed pad, verbal cuing for head-hips rule to offweight L trunk and hip. This was novel information for pt.  Transfers Overall transfer level: (NT)                  Ambulation/Gait                Stairs            Wheelchair Mobility    Modified Rankin (Stroke Patients Only)       Balance Overall balance assessment: Needs assistance Sitting-balance support: Single extremity supported Sitting balance-Leahy Scale: Fair Sitting balance - Comments: able to sit EOB for 5 minutes unsupported by PT, requires steadying assist with dynamic sitting balance   Standing balance support: (NT)                                 Pertinent Vitals/Pain Pain Assessment: Faces Faces Pain Scale: Hurts little more Pain Location: "my whole body", with LE spasms Pain Intervention(s): Limited activity within patient's tolerance;Monitored during session;Repositioned;Relaxation  Home Living Family/patient expects to be discharged to:: Private residence Living Arrangements: Spouse/significant other;Children Available Help at Discharge: Family Type of Home: House Home Access: Ramped entrance(Pt describes it as a trunk ramp that is very steep)     Home Layout: Able to live on main level with bedroom/bathroom Home Equipment: Wheelchair - power      Prior Function Level of Independence: Needs assistance   Gait / Transfers Assistance Needed: pt requires  total assist for transfer, which pt describes as her husband carrying the pt on his back for bed<>chair transfer  ADL's / Homemaking Assistance Needed: Pt wears briefs, requires total assist for pericare. Pt can dress her upper body, but requires assist for lower body. Pt also receives assist with bathing, all household tasks.        Hand Dominance   Dominant Hand: Right    Extremity/Trunk Assessment   Upper Extremity Assessment Upper Extremity Assessment: LUE deficits/detail;RUE deficits/detail RUE Deficits / Details: MMT: shoulder ER 3+/5, shoulder IR 3+/5, elbow flexion 4/5, triceps 1/5, wrist extension 3/5, wrist flexion mostly gravity-controlled motion, finger flexion/extension unable LUE Deficits / Details: MMT: shoulder ER 3/5, shoulder IR 3/5, elbow flexion 3+/5, triceps 0/5, wrist extension 2+/5, wrist flexion mostly gravity-controlled motion, finger flexion/extension unable    Lower Extremity Assessment Lower Extremity Assessment: LLE deficits/detail;RLE deficits/detail RLE Deficits / Details: flexor spasicity noted, no active muscle control over LEs. Pt keeps her LEs tied down in bed to "get some rest" because her spasms and spasticity are so severe. Pt in PF position of both feet. LLE Deficits / Details: flexor spasicity noted, no active muscle control over LEs. Pt keeps her LEs tied down in bed to "get some rest" because her spasms and spasticity are so severe. Pt in PF position of both feet.    Cervical / Trunk Assessment Cervical / Trunk Assessment: Other exceptions Cervical / Trunk Exceptions: significant forward flexed trunk due to trunk weakness; some trunk control noted in sitting during pt assisted scooting and unsupported sitting  Communication   Communication: No difficulties  Cognition Arousal/Alertness: Awake/alert Behavior During Therapy: WFL for tasks assessed/performed Overall Cognitive Status: Within Functional Limits for tasks assessed                                         General Comments General comments (skin integrity, edema, etc.): Pt with decubitus ulcers, history of pressure sores    Exercises     Assessment/Plan    PT Assessment Patient needs continued PT services  PT Problem List Decreased strength;Decreased mobility;Impaired tone;Decreased safety awareness;Decreased activity tolerance;Decreased balance;Decreased knowledge of use of DME;Pain;Decreased skin integrity;Obesity       PT Treatment Interventions DME instruction;Therapeutic activities;Patient/family education;Balance training;Therapeutic exercise;Wheelchair mobility training;Functional mobility training    PT Goals (Current goals can be found in the Care Plan section)  Acute Rehab PT Goals Patient Stated Goal: be able to transfer to my wheelchair more on my own PT Goal Formulation: With patient Time For Goal Achievement: 03/07/19 Potential to Achieve Goals: Good    Frequency Min 3X/week   Barriers to discharge        Co-evaluation               AM-PAC PT "6 Clicks" Mobility  Outcome Measure Help needed turning from your back to your side while in a flat bed without using bedrails?: A Lot Help needed moving from  lying on your back to sitting on the side of a flat bed without using bedrails?: A Lot Help needed moving to and from a bed to a chair (including a wheelchair)?: Total Help needed standing up from a chair using your arms (e.g., wheelchair or bedside chair)?: Total Help needed to walk in hospital room?: Total Help needed climbing 3-5 steps with a railing? : Total 6 Click Score: 8    End of Session   Activity Tolerance: Patient tolerated treatment well Patient left: in bed;with bed alarm set;with call bell/phone within reach(pt requesting bilateral ankles with tied tension to bedframe to limit LE spasms) Nurse Communication: Mobility status PT Visit Diagnosis: Other symptoms and signs involving the nervous system  (R29.898);Muscle weakness (generalized) (M62.81);Other abnormalities of gait and mobility (R26.89);Pain Pain - Right/Left: (whole) Pain - part of body: (body)    Time: [id number] PT Time Calculation (min) (ACUTE ONLY): 32 min   Charges:   PT Evaluation $PT Eval Low Complexity: 1 Low PT Treatments $Therapeutic Activity: 8-22 mins        Julien Girt, PT Acute Rehabilitation Services Pager [phone]  Office [phone]   Lynsi Dooner D Romi Rathel 02/21/2019, 12:50 PM

## 2019-02-22 DIAGNOSIS — E876 Hypokalemia: Secondary | ICD-10-CM

## 2019-02-22 DIAGNOSIS — G8250 Quadriplegia, unspecified: Secondary | ICD-10-CM

## 2019-02-22 DIAGNOSIS — R6520 Severe sepsis without septic shock: Secondary | ICD-10-CM

## 2019-02-22 DIAGNOSIS — N179 Acute kidney failure, unspecified: Secondary | ICD-10-CM

## 2019-02-22 DIAGNOSIS — N319 Neuromuscular dysfunction of bladder, unspecified: Secondary | ICD-10-CM

## 2019-02-22 LAB — CBC
HCT: 27.8 % — ABNORMAL LOW (ref 36.0–46.0)
Hemoglobin: 8.3 g/dL — ABNORMAL LOW (ref 12.0–15.0)
MCH: 21.8 pg — ABNORMAL LOW (ref 26.0–34.0)
MCHC: 29.9 g/dL — ABNORMAL LOW (ref 30.0–36.0)
MCV: 73 fL — ABNORMAL LOW (ref 80.0–100.0)
Platelets: 462 10*3/uL — ABNORMAL HIGH (ref 150–400)
RBC: 3.81 MIL/uL — ABNORMAL LOW (ref 3.87–5.11)
RDW: 24 % — ABNORMAL HIGH (ref 11.5–15.5)
WBC: 10.7 10*3/uL — ABNORMAL HIGH (ref 4.0–10.5)
nRBC: 0 % (ref 0.0–0.2)

## 2019-02-22 LAB — COMPREHENSIVE METABOLIC PANEL
ALT: 19 U/L (ref 0–44)
AST: 20 U/L (ref 15–41)
Albumin: 3 g/dL — ABNORMAL LOW (ref 3.5–5.0)
Alkaline Phosphatase: 71 U/L (ref 38–126)
Anion gap: 10 (ref 5–15)
BUN: 7 mg/dL (ref 6–20)
CO2: 26 mmol/L (ref 22–32)
Calcium: 8.4 mg/dL — ABNORMAL LOW (ref 8.9–10.3)
Chloride: 100 mmol/L (ref 98–111)
Creatinine, Ser: 0.81 mg/dL (ref 0.44–1.00)
GFR calc Af Amer: >60 mL/min (ref >60)
GFR calc non Af Amer: >60 mL/min (ref >60)
Glucose, Bld: 98 mg/dL (ref 70–99)
Potassium: 3.4 mmol/L — ABNORMAL LOW (ref 3.5–5.1)
Sodium: 136 mmol/L (ref 135–145)
Total Bilirubin: 0.4 mg/dL (ref 0.3–1.2)
Total Protein: 7.1 g/dL (ref 6.5–8.1)

## 2019-02-22 LAB — CULTURE, BLOOD (ROUTINE X 2)
Culture: NO GROWTH
Culture: NO GROWTH
Special Requests: ADEQUATE

## 2019-02-22 LAB — GC/CHLAMYDIA PROBE AMP (~~LOC~~) NOT AT ARMC
Chlamydia: NEGATIVE
Neisseria Gonorrhea: NEGATIVE

## 2019-02-22 LAB — MAGNESIUM: Magnesium: 1.8 mg/dL (ref 1.7–2.4)

## 2019-02-22 LAB — LACTIC ACID, PLASMA: Lactic Acid, Venous: 1.4 mmol/L (ref 0.5–1.9)

## 2019-02-22 LAB — RPR: RPR Ser Ql: NONREACTIVE

## 2019-02-22 LAB — PROCALCITONIN: Procalcitonin: 7.12 ng/mL

## 2019-02-22 MED ORDER — SODIUM CHLORIDE 0.9 % IV BOLUS
500.0000 mL | Freq: Once | INTRAVENOUS | Status: AC
  Administered 2019-02-22: 500 mL via INTRAVENOUS

## 2019-02-22 MED ORDER — HYDRALAZINE HCL 20 MG/ML IJ SOLN: 10.0000 mg | Freq: Four times a day (QID) | INTRAMUSCULAR | Status: DC | PRN

## 2019-02-22 MED ORDER — LEVOFLOXACIN 750 MG PO TABS
750.0000 mg | ORAL_TABLET | Freq: Every day | ORAL | Status: DC
  Administered 2019-02-22 – 2019-02-23 (×2): 750 mg via ORAL
  Filled 2019-02-22 (×2): qty 1

## 2019-02-22 NOTE — Progress Notes (Signed)
Came in at pt.'s room for IV start. Pt. REFUSED , RN and tech at bedside. Pt refused CVL placement by Critical care MD earlier.

## 2019-02-22 NOTE — Progress Notes (Signed)
Three attempts made to place periphrial IVS. Unable to place IV On call provider aware of no IV Access. Patient alert and oriented with no complaints,Will continue to monitor patient.

## 2019-02-22 NOTE — Progress Notes (Signed)
Pt's BP 170/93. MD Erlinda Hong made aware- PRN Hydralazine ordered for SBP >180. Will continue to monitor. Pt also has requests to tie bilateral ankles to bottom bed rail with sheets. Pt is a quadriplegic and states this helps better control her muscle spasms. Sheets tied very loosely, skin intact. Also, pt has not had a BM since Friday per pt (states she had diarrhea from Wed-Friday last week). Pt refused scheduled Miralax this AM. Pt's abd is distended. MD Xu aware.

## 2019-02-22 NOTE — Progress Notes (Signed)
Inpatient Rehab Admissions:  Inpatient Rehab Consult received.  I spoke with the patient over the phone for rehabilitation assessment and to discuss goals and expectations of an inpatient rehab admission.  She is hopeful for CIR for some formal SCI education and to return to her PLOF.  I have approval from Dr. Posey Pronto to plan for admission tomorrow pending medical readiness.   Signed: Shann Medal, PT, DPT Admissions Coordinator [phone] 02/22/19  2:59 PM

## 2019-02-22 NOTE — Significant Event (Signed)
OVERNIGHT COVERAGE CRITICAL CARE PROGRESS NOTE  Called to see patient re: placement of central venous catheter.  Patient is a 38 year old African-American female with quadriparesis and neurogenic bladder and recurrent pyelonephritis.  She was found to have a urinary tract infection with bilateral hydronephrosis, thought to be due to neurogenic bladder.  Foley catheter was placed and she has been on IV antibiotic therapy.  Clinically, she has been improving throughout her hospital hospitalization.  Request for line placement was placed by the nighttime hospitalist due to concerns about hypotension.  The patient is adamant that this blood pressure is normal for her.  Certainly, she is mentating well and has no other signs or symptoms.  Case was discussed with Dr. Hal Hope, who decided that he had concerns about a rising procalcitonin and gentle downward trend in her blood pressure.  However, her procalcitonin peaked on 8/31 at 27.35 and is now down to 7.12.  I have discussed the patient's clinical status with Dr. Hal Hope by phone.  As she is able to take p.o. medications, she should be able to transition to p.o. antibiotics.  I agree with the patient that a central venous catheter is not required at this time.  Please call if needed.  Renee Pain, MD Board Certified by the ABIM, [place]

## 2019-02-22 NOTE — Progress Notes (Signed)
PROGRESS NOTE  JANAYA TRIOLA [id number] DOB: [birth year] DOA: 02/17/2019 PCP: [name], DO  Brief Narrative:  Lyla Glassing Brownis a 38 y.o.femalewith medical history significant ofquadriplegia and neurogenic bladder, recurrent pyelonephritis, autonomic dysfunction, morbid obesity who presented to the ER with generalized weakness fever chills and weakness.  She was found to have a UTI with bilateral hydro due to neurogenic bladder.  Foley catheter was placed and she's improved with antibiotics.    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[place])   Neurogenic bladder   Recurrent UTI   Obesity   AKI (acute kidney injury) ([place])   Hypokalemia   Chronic pain syndrome   Sepsis ([place])  Sepsis 2/2 to Urinary Tract Infection /pyelonephritis/ Bilateral Hydronephrosis 2/2 Neurogenic Bladder: S/p foley insertion this hospitalization Urology input appreciated Change rocephin to oral levaquin due to lack of iv access and clinical improvement She need to follow up with urology  AKI on CKDII, resolved, cr normalized  Hypokalemia/hypomagnesemia:  Replace prn to keep k >4, mag >2  Microcytic Anemia: She does not have overt sign of bleeding, she reports chronic constipation, no bm for several days, she declined stool softener prbc transfusion x2 units Will need to follow up with pcp   Vaginal Discharge  Trichomoniasis  Bacterial Vaginosis: 7 days of flagyl 500 mg BID HIV/RPR/gc/chlamydia negative   FTT/quadriplegia PT/OT recommend CIR placement Case discussed with cir, likely discharge  to CIR in am  Body mass index is 32.28 kg/m.   DVT Prophylaxis:scd  Code Status: full  Family Communication: patient   Disposition Plan: inpatient rehab on 9/3   Consultants:  Inpatient rehab  Urology   Procedures:  Foley insertion   Antibiotics:  As above   Objective: BP 138/85 (BP Location: Right Leg)   Pulse (!) 103   Temp 98.9 F (37.2 C) (Oral)   Resp 16   Ht 5\' 6"  (1.676 m)   Wt 90.7 kg   SpO2 100%   BMI 32.28 kg/m   Intake/Output Summary (Last 24 hours) at 02/22/2019 1150 Last data filed at 02/22/2019 0500 Gross per 24 hour  Intake 350 ml  Output 1950 ml  Net -1600 ml   Filed Weights   02/17/19 2112  Weight: 90.7 kg    Exam: Patient is examined daily including today on 02/22/2019, exams remain the same as of yesterday except that has changed    General:  NAD  Cardiovascular: RRR  Respiratory: CTABL  Abdomen: Soft/ND/NT, positive BS  Musculoskeletal: No Edema  Neuro: alert, oriented   Data Reviewed: Basic Metabolic Panel: Recent Labs  Lab 02/18/19 1647 02/19/19 0220 02/20/19 0720 02/21/19 0459 02/22/19 0122  NA 138 137 137 135 136  K 3.2* 3.8 3.3* 3.4* 3.4*  CL 109 109 104 101 100  CO2 19* 18* 24 23 26   GLUCOSE 89 92 108* 111* 98  BUN 10 9 10 7 7   CREATININE 0.77 0.81 0.67 0.64 0.81  CALCIUM 7.8* 7.9* 8.3* 8.3* 8.4*  MG  --  1.7 1.6* 2.1 1.8   Liver Function Tests: Recent Labs  Lab 02/18/19 1647  02/19/19 0220 02/20/19 0720 02/21/19 0459 02/22/19 0122  AST 14* 17 16 22 20   ALT 13 15 18 19 19   ALKPHOS 66 64 74 70 71  BILITOT 0.5 0.5 0.2* 0.2* 0.4  PROT 6.6 6.3* 6.4* 6.6 7.1  ALBUMIN 2.9* 2.9* 2.8* 2.9* 3.0*   No results for input(s): LIPASE, AMYLASE in the last 168 hours. No results for input(s): AMMONIA in the last 168 hours. CBC: Recent Labs  Lab 02/17/19 2200 02/18/19 1647 02/19/19 0220 02/20/19 0720 02/21/19 0459 02/22/19 0122  WBC 22.2*  --  11.1* 6.7 9.0 10.7*  NEUTROABS 19.5*  --   --   --   --    --   HGB 6.0*  --  7.5* 7.7* 7.9* 8.3*  HCT 21.3* 26.3* 25.4* 26.1* 26.3* 27.8*  MCV 68.1*  --  74.5* 73.7* 73.1* 73.0*  PLT 515*  --  401* 400 383 462*   Cardiac Enzymes:   No results for input(s): CKTOTAL, CKMB, CKMBINDEX, TROPONINI in the last 168 hours. BNP (last 3 results) No results for input(s): BNP in the last 8760 hours.  ProBNP (last 3 results) No results for input(s): PROBNP in the last 8760 hours.  CBG: No results for input(s): GLUCAP in the last 168 hours.  Recent Results (from the past 240 hour(s))  Urine culture     Status: Abnormal   Collection Time: 02/17/19  9:38 PM   Specimen: In/Out Cath Urine  Result Value Ref Range Status   Specimen Description   Final    IN/OUT CATH URINE Performed at [facility] [address], [place], [place] [address]    Special Requests   Final    NONE Performed at [facility], [place] [address], [place],  [address]    Culture (A)  Final    >=100,000 COLONIES/mL CITROBACTER KOSERI >=100,000 COLONIES/mL PROVIDENCIA STUARTII 80,000 COLONIES/mL VIRIDANS STREPTOCOCCUS Standardized susceptibility testing for this organism is not available. Performed at [facility], [place] [address], [place],  [address]    Report Status 02/21/2019 FINAL  Final   Organism ID, Bacteria CITROBACTER KOSERI (A)  Final   Organism ID, Bacteria PROVIDENCIA STUARTII (A)  Final      Susceptibility   Citrobacter koseri - MIC*    CEFAZOLIN <=4 SENSITIVE Sensitive     CEFTRIAXONE <=1 SENSITIVE Sensitive     CIPROFLOXACIN <=0.25 SENSITIVE Sensitive     GENTAMICIN <=1 SENSITIVE Sensitive     IMIPENEM <=0.25 SENSITIVE Sensitive     NITROFURANTOIN <=16 SENSITIVE Sensitive     TRIMETH/SULFA <=20 SENSITIVE Sensitive     PIP/TAZO <=4 SENSITIVE Sensitive     * >=100,000 COLONIES/mL CITROBACTER KOSERI   Providencia stuartii - MIC*    AMPICILLIN RESISTANT Resistant     CEFAZOLIN >=64 RESISTANT Resistant      CEFTRIAXONE <=1 SENSITIVE Sensitive     CIPROFLOXACIN <=0.25 SENSITIVE Sensitive     GENTAMICIN RESISTANT Resistant     IMIPENEM 1 SENSITIVE Sensitive     NITROFURANTOIN 256 RESISTANT Resistant     TRIMETH/SULFA <=20 SENSITIVE Sensitive     AMPICILLIN/SULBACTAM 16 INTERMEDIATE Intermediate     PIP/TAZO <=4 SENSITIVE Sensitive     * >=100,000 COLONIES/mL PROVIDENCIA STUARTII  Culture, blood (Routine x 2)     Status: None (Preliminary result)   Collection Time: 02/17/19  9:58 PM   Specimen: BLOOD  Result Value Ref Range Status   Specimen Description   Final    BLOOD LEFT ANTECUBITAL  Performed at [facility], [place] [address], [place], [place] [address]    Special Requests   Final    BOTTLES DRAWN AEROBIC AND ANAEROBIC Blood Culture results may not be optimal due to an excessive volume of blood received in culture bottles Performed at [facility] [address], [place], [place] [address]    Culture   Final    NO GROWTH 4 DAYS Performed at [facility], [place] [address], [place], [place] [address]    Report Status PENDING  Incomplete  Culture, blood (Routine x 2)     Status: None (Preliminary result)   Collection Time: 02/17/19 10:00 PM   Specimen: BLOOD LEFT HAND  Result Value Ref Range Status   Specimen Description   Final    BLOOD LEFT HAND Performed at [facility] [address], [place], [place] [address]    Special Requests   Final    BOTTLES DRAWN AEROBIC ONLY Blood Culture adequate volume Performed at [facility] [address] [place], [place] [address]    Culture   Final    NO GROWTH 4 DAYS Performed at [facility], [place] [address], [place], [place] [address]    Report Status PENDING  Incomplete  SARS Coronavirus 2 [facility] order, Performed in [facility] hospital lab) Nasopharyngeal Nasopharyngeal Swab     Status: None   Collection Time: 02/17/19 11:04 PM   Specimen:  Nasopharyngeal Swab  Result Value Ref Range Status   SARS Coronavirus 2 NEGATIVE NEGATIVE Final    Comment: (NOTE) If result is NEGATIVE SARS-CoV-2 target nucleic acids are NOT DETECTED. The SARS-CoV-2 RNA is generally detectable in upper and lower  respiratory specimens during the acute phase of infection. The lowest  concentration of SARS-CoV-2 viral copies this assay can detect is 250  copies / mL. A negative result does not preclude SARS-CoV-2 infection  and should not be used as the sole basis for treatment or other  patient management decisions.  A negative result may occur with  improper specimen collection / handling, submission of specimen other  than nasopharyngeal swab, presence of viral mutation(s) within the  areas targeted by this assay, and inadequate number of viral copies  (<250 copies / mL). A negative result must be combined with clinical  observations, patient history, and epidemiological information. If result is POSITIVE SARS-CoV-2 target nucleic acids are DETECTED. The SARS-CoV-2 RNA is generally detectable in upper and lower  respiratory specimens dur ing the acute phase of infection.  Positive  results are indicative of active infection with SARS-CoV-2.  Clinical  correlation with patient history and other diagnostic information is  necessary to determine patient infection status.  Positive results do  not rule out bacterial infection or co-infection with other viruses. If result is PRESUMPTIVE POSTIVE SARS-CoV-2 nucleic acids MAY BE PRESENT.   A presumptive positive result was obtained on the submitted specimen  and confirmed on repeat testing.  While 2019 novel coronavirus  (SARS-CoV-2) nucleic acids may be present in the submitted sample  additional confirmatory testing may be necessary for epidemiological  and / or clinical management purposes  to differentiate between  SARS-CoV-2 and other Sarbecovirus currently known to infect humans.  If clinically  indicated additional testing with an alternate test  methodology [phone]) is advised. The SARS-CoV-2 RNA is generally  detectable in upper and lower respiratory sp ecimens during the acute  phase of infection. The expected result is Negative. Fact  Sheet for Patients:  StrictlyIdeas.no Fact Sheet for Healthcare Providers: BankingDealers.co.za This test is not yet approved or cleared by the [place] FDA and has been authorized for detection and/or diagnosis of SARS-CoV-2 by FDA under an Emergency Use Authorization (EUA).  This EUA will remain in effect (meaning this test can be used) for the duration of the COVID-19 declaration under Section 564(b)(1) of the Act, 21 U.S.C. section 360bbb-3(b)(1), unless the authorization is terminated or revoked sooner. Performed at [facility], [place] [address], [place], [place] [address]   Wet prep, genital     Status: Abnormal   Collection Time: 02/21/19 12:55 PM  Result Value Ref Range Status   Yeast Wet Prep HPF POC NONE SEEN NONE SEEN Final   Trich, Wet Prep PRESENT (A) NONE SEEN Final   Clue Cells Wet Prep HPF POC PRESENT (A) NONE SEEN Final   WBC, Wet Prep HPF POC MANY (A) NONE SEEN Final   Sperm NONE SEEN  Final    Comment: Performed at [facility], [place] [address], [place], [place] [address]     Studies: Ct Abdomen Pelvis Wo Contrast  Result Date: 02/21/2019 CLINICAL DATA:  Quadriplegia and neurogenic bladder with current pyelonephritis. Generalized weakness fever and chills. EXAM: CT ABDOMEN AND PELVIS WITHOUT CONTRAST TECHNIQUE: Multidetector CT imaging of the abdomen and pelvis was performed following the standard protocol without IV contrast. COMPARISON:  CT dated May 11, 2018. FINDINGS: Lower chest: The lung bases are clear. The heart size is normal. Hepatobiliary: The liver is normal. Normal gallbladder.There is no biliary ductal dilation.  Pancreas: Normal contours without ductal dilatation. No peripancreatic fluid collection. Spleen: No splenic laceration or hematoma. Adrenals/Urinary Tract: --Adrenal glands: No adrenal hemorrhage. --Right kidney/ureter: There is mild right-sided hydroureteronephrosis to the level of the urinary bladder. The ureteral wall appears thickened. --Left kidney/ureter: There is mild left-sided hydroureteronephrosis to the level of the urinary bladder. The ureteral wall appears thickened. --Urinary bladder: The urinary bladder demonstrates a diffusely thickened bladder wall. A Foley catheter is present. Stomach/Bowel: --Stomach/Duodenum: No hiatal hernia or other gastric abnormality. Normal duodenal course and caliber. --Small bowel: No dilatation or inflammation. --Colon: No focal abnormality. --Appendix: Normal. Vascular/Lymphatic: Normal course and caliber of the major abdominal vessels. --there are enlarged retroperitoneal lymph nodes. --No mesenteric lymphadenopathy. --No pelvic or inguinal lymphadenopathy. Reproductive: Unremarkable Other: There is a trace amount of free fluid in the patient's pelvis. The abdominal wall is normal. Musculoskeletal. No acute displaced fractures. IMPRESSION: Mild bilateral hydroureteronephrosis to the level of the urinary bladder. There appears to be some wall thickening of the bilateral ureters raising suspicion for an ascending urinary tract infection. There is diffuse bladder wall thickening which may related to the reported history of a neurogenic bladder. Correlation with urinalysis is recommended. A Foley catheter is noted. Electronically Signed   By: Constance Holster M.D.   On: 02/21/2019 22:01    Scheduled Meds: . Chlorhexidine Gluconate Cloth  6 each Topical Daily  . DULoxetine  30 mg Oral Daily  . ferrous sulfate  325 mg Oral Q breakfast  . levofloxacin  750 mg Oral Daily  . metroNIDAZOLE  500 mg Oral BID  . polyethylene glycol  17 g Oral Daily  . tamsulosin  0.4 mg  Oral QPC supper    Continuous Infusions:   Time spent: 69mins I have personally reviewed and interpreted on  02/22/2019 daily labs, tele strips, imagings as discussed above under date review session and assessment and plans.  I reviewed all nursing notes, pharmacy notes, consultant notes,  vitals, pertinent old records  I have discussed plan of care as described above with RN , patient  on 02/22/2019   Florencia Reasons MD, PhD, FACP  [facility] Pager [phone]. If 7PM-7AM, please contact night-coverage at [email or web address], password [facility] 02/22/2019, 11:50 AM  LOS: 4 days

## 2019-02-23 ENCOUNTER — Encounter (HOSPITAL_COMMUNITY): Payer: Self-pay | Admitting: *Deleted

## 2019-02-23 ENCOUNTER — Inpatient Hospital Stay (HOSPITAL_COMMUNITY)
Admission: RE | Admit: 2019-02-23 | Discharge: 2019-03-10 | DRG: 945 | Disposition: A | Discharge status: Home Health | Payer: Medicare Other | Source: Intra-hospital | Attending: Physical Medicine and Rehabilitation | Admitting: Physical Medicine and Rehabilitation

## 2019-02-23 ENCOUNTER — Encounter (HOSPITAL_COMMUNITY): Payer: Self-pay | Admitting: Physical Medicine and Rehabilitation

## 2019-02-23 ENCOUNTER — Inpatient Hospital Stay (HOSPITAL_COMMUNITY): Payer: Medicare Other

## 2019-02-23 ENCOUNTER — Other Ambulatory Visit: Payer: Self-pay

## 2019-02-23 DIAGNOSIS — F419 Anxiety disorder, unspecified: Secondary | ICD-10-CM | POA: Diagnosis present

## 2019-02-23 DIAGNOSIS — G8253 Quadriplegia, C5-C7 complete: Secondary | ICD-10-CM | POA: Diagnosis present

## 2019-02-23 DIAGNOSIS — Y92239 Unspecified place in hospital as the place of occurrence of the external cause: Secondary | ICD-10-CM | POA: Diagnosis not present

## 2019-02-23 DIAGNOSIS — A419 Sepsis, unspecified organism: Secondary | ICD-10-CM | POA: Diagnosis not present

## 2019-02-23 DIAGNOSIS — E6609 Other obesity due to excess calories: Secondary | ICD-10-CM | POA: Diagnosis not present

## 2019-02-23 DIAGNOSIS — F329 Major depressive disorder, single episode, unspecified: Secondary | ICD-10-CM | POA: Diagnosis present

## 2019-02-23 DIAGNOSIS — Z823 Family history of stroke: Secondary | ICD-10-CM

## 2019-02-23 DIAGNOSIS — D649 Anemia, unspecified: Secondary | ICD-10-CM

## 2019-02-23 DIAGNOSIS — A4151 Sepsis due to Escherichia coli [E. coli]: Secondary | ICD-10-CM | POA: Diagnosis present

## 2019-02-23 DIAGNOSIS — G904 Autonomic dysreflexia: Secondary | ICD-10-CM | POA: Diagnosis present

## 2019-02-23 DIAGNOSIS — R5381 Other malaise: Secondary | ICD-10-CM | POA: Diagnosis not present

## 2019-02-23 DIAGNOSIS — Z79899 Other long term (current) drug therapy: Secondary | ICD-10-CM

## 2019-02-23 DIAGNOSIS — Z888 Allergy status to other drugs, medicaments and biological substances status: Secondary | ICD-10-CM

## 2019-02-23 DIAGNOSIS — N760 Acute vaginitis: Secondary | ICD-10-CM | POA: Diagnosis present

## 2019-02-23 DIAGNOSIS — E46 Unspecified protein-calorie malnutrition: Secondary | ICD-10-CM | POA: Diagnosis present

## 2019-02-23 DIAGNOSIS — T380X5A Adverse effect of glucocorticoids and synthetic analogues, initial encounter: Secondary | ICD-10-CM | POA: Diagnosis not present

## 2019-02-23 DIAGNOSIS — D638 Anemia in other chronic diseases classified elsewhere: Secondary | ICD-10-CM | POA: Diagnosis not present

## 2019-02-23 DIAGNOSIS — N319 Neuromuscular dysfunction of bladder, unspecified: Secondary | ICD-10-CM | POA: Diagnosis present

## 2019-02-23 DIAGNOSIS — L309 Dermatitis, unspecified: Secondary | ICD-10-CM | POA: Diagnosis present

## 2019-02-23 DIAGNOSIS — E876 Hypokalemia: Secondary | ICD-10-CM | POA: Diagnosis present

## 2019-02-23 DIAGNOSIS — G8929 Other chronic pain: Secondary | ICD-10-CM | POA: Diagnosis present

## 2019-02-23 DIAGNOSIS — Z91040 Latex allergy status: Secondary | ICD-10-CM

## 2019-02-23 DIAGNOSIS — M21371 Foot drop, right foot: Secondary | ICD-10-CM | POA: Diagnosis present

## 2019-02-23 DIAGNOSIS — G8250 Quadriplegia, unspecified: Secondary | ICD-10-CM | POA: Diagnosis not present

## 2019-02-23 DIAGNOSIS — M21372 Foot drop, left foot: Secondary | ICD-10-CM | POA: Diagnosis present

## 2019-02-23 DIAGNOSIS — K5900 Constipation, unspecified: Secondary | ICD-10-CM

## 2019-02-23 DIAGNOSIS — Z6835 Body mass index (BMI) 35.0-35.9, adult: Secondary | ICD-10-CM

## 2019-02-23 DIAGNOSIS — R5383 Other fatigue: Secondary | ICD-10-CM

## 2019-02-23 DIAGNOSIS — M62838 Other muscle spasm: Secondary | ICD-10-CM

## 2019-02-23 DIAGNOSIS — W3400XS Accidental discharge from unspecified firearms or gun, sequela: Secondary | ICD-10-CM

## 2019-02-23 DIAGNOSIS — Z8711 Personal history of peptic ulcer disease: Secondary | ICD-10-CM | POA: Diagnosis not present

## 2019-02-23 DIAGNOSIS — G4700 Insomnia, unspecified: Secondary | ICD-10-CM | POA: Diagnosis present

## 2019-02-23 DIAGNOSIS — K592 Neurogenic bowel, not elsewhere classified: Secondary | ICD-10-CM | POA: Diagnosis not present

## 2019-02-23 DIAGNOSIS — IMO0002 Reserved for concepts with insufficient information to code with codable children: Secondary | ICD-10-CM

## 2019-02-23 DIAGNOSIS — M7989 Other specified soft tissue disorders: Secondary | ICD-10-CM | POA: Diagnosis not present

## 2019-02-23 DIAGNOSIS — D509 Iron deficiency anemia, unspecified: Secondary | ICD-10-CM | POA: Diagnosis present

## 2019-02-23 DIAGNOSIS — S14106S Unspecified injury at C6 level of cervical spinal cord, sequela: Secondary | ICD-10-CM | POA: Diagnosis not present

## 2019-02-23 DIAGNOSIS — M62831 Muscle spasm of calf: Secondary | ICD-10-CM | POA: Diagnosis present

## 2019-02-23 DIAGNOSIS — E669 Obesity, unspecified: Secondary | ICD-10-CM | POA: Diagnosis present

## 2019-02-23 DIAGNOSIS — N1330 Unspecified hydronephrosis: Secondary | ICD-10-CM

## 2019-02-23 LAB — CBC WITH DIFFERENTIAL/PLATELET
Abs Immature Granulocytes: 0.04 10*3/uL (ref 0.00–0.07)
Basophils Absolute: 0 10*3/uL (ref 0.0–0.1)
Basophils Relative: 0 %
Eosinophils Absolute: 0.3 10*3/uL (ref 0.0–0.5)
Eosinophils Relative: 2 %
HCT: 27.9 % — ABNORMAL LOW (ref 36.0–46.0)
Hemoglobin: 8.2 g/dL — ABNORMAL LOW (ref 12.0–15.0)
Immature Granulocytes: 0 %
Lymphocytes Relative: 28 %
Lymphs Abs: 2.9 10*3/uL (ref 0.7–4.0)
MCH: 21.7 pg — ABNORMAL LOW (ref 26.0–34.0)
MCHC: 29.4 g/dL — ABNORMAL LOW (ref 30.0–36.0)
MCV: 73.8 fL — ABNORMAL LOW (ref 80.0–100.0)
Monocytes Absolute: 0.8 10*3/uL (ref 0.1–1.0)
Monocytes Relative: 7 %
Neutro Abs: 6.5 10*3/uL (ref 1.7–7.7)
Neutrophils Relative %: 63 %
Platelets: 433 10*3/uL — ABNORMAL HIGH (ref 150–400)
RBC: 3.78 MIL/uL — ABNORMAL LOW (ref 3.87–5.11)
RDW: 24.4 % — ABNORMAL HIGH (ref 11.5–15.5)
WBC: 10.4 10*3/uL (ref 4.0–10.5)
nRBC: 0 % (ref 0.0–0.2)

## 2019-02-23 LAB — BASIC METABOLIC PANEL
Anion gap: 10 (ref 5–15)
BUN: 7 mg/dL (ref 6–20)
CO2: 26 mmol/L (ref 22–32)
Calcium: 8.7 mg/dL — ABNORMAL LOW (ref 8.9–10.3)
Chloride: 102 mmol/L (ref 98–111)
Creatinine, Ser: 0.53 mg/dL (ref 0.44–1.00)
GFR calc Af Amer: >60 mL/min (ref >60)
GFR calc non Af Amer: >60 mL/min (ref >60)
Glucose, Bld: 110 mg/dL — ABNORMAL HIGH (ref 70–99)
Potassium: 3.3 mmol/L — ABNORMAL LOW (ref 3.5–5.1)
Sodium: 138 mmol/L (ref 135–145)

## 2019-02-23 LAB — MAGNESIUM: Magnesium: 1.9 mg/dL (ref 1.7–2.4)

## 2019-02-23 MED ORDER — POLYETHYLENE GLYCOL 3350 17 G PO PACK: 17.0000 g | PACK | Freq: Every day | ORAL | 0 refills | Status: DC

## 2019-02-23 MED ORDER — POLYETHYLENE GLYCOL 3350 17 G PO PACK
17.0000 g | PACK | Freq: Every day | ORAL | Status: DC
  Administered 2019-02-23 – 2019-02-24 (×2): 17 g via ORAL
  Filled 2019-02-23 (×2): qty 1

## 2019-02-23 MED ORDER — LEVOFLOXACIN 750 MG PO TABS: 750.0000 mg | ORAL_TABLET | Freq: Every day | ORAL | 0 refills | Status: DC

## 2019-02-23 MED ORDER — LEVOFLOXACIN 750 MG PO TABS
750.0000 mg | ORAL_TABLET | Freq: Every day | ORAL | Status: AC
  Administered 2019-02-23 – 2019-03-02 (×8): 750 mg via ORAL
  Filled 2019-02-23 (×8): qty 1

## 2019-02-23 MED ORDER — TAMSULOSIN HCL 0.4 MG PO CAPS
0.4000 mg | ORAL_CAPSULE | Freq: Every day | ORAL | Status: DC
  Administered 2019-02-23: 0.4 mg via ORAL
  Filled 2019-02-23: qty 1

## 2019-02-23 MED ORDER — CHLORHEXIDINE GLUCONATE CLOTH 2 % EX PADS
6.0000 | MEDICATED_PAD | Freq: Every day | CUTANEOUS | Status: DC
  Administered 2019-02-23 – 2019-03-09 (×14): 6 via TOPICAL

## 2019-02-23 MED ORDER — TAMSULOSIN HCL 0.4 MG PO CAPS: 0.4000 mg | ORAL_CAPSULE | Freq: Every day | ORAL | 0 refills | Status: DC

## 2019-02-23 MED ORDER — SENNOSIDES-DOCUSATE SODIUM 8.6-50 MG PO TABS
2.0000 | ORAL_TABLET | Freq: Every day | ORAL | Status: DC
  Administered 2019-02-23: 2 via ORAL
  Filled 2019-02-23: qty 2

## 2019-02-23 MED ORDER — HYDROXYZINE HCL 25 MG PO TABS
25.0000 mg | ORAL_TABLET | Freq: Three times a day (TID) | ORAL | Status: DC | PRN
  Administered 2019-02-27: 25 mg via ORAL
  Filled 2019-02-23 (×3): qty 1

## 2019-02-23 MED ORDER — LEVOFLOXACIN 750 MG PO TABS
750.0000 mg | ORAL_TABLET | Freq: Every day | ORAL | Status: DC
  Filled 2019-02-23: qty 1

## 2019-02-23 MED ORDER — METRONIDAZOLE 500 MG PO TABS
500.0000 mg | ORAL_TABLET | Freq: Two times a day (BID) | ORAL | Status: AC
  Administered 2019-02-23 – 2019-02-27 (×10): 500 mg via ORAL
  Filled 2019-02-23 (×10): qty 1

## 2019-02-23 MED ORDER — FERROUS SULFATE 325 (65 FE) MG PO TABS
325.0000 mg | ORAL_TABLET | Freq: Two times a day (BID) | ORAL | Status: DC
  Administered 2019-02-23 – 2019-03-10 (×29): 325 mg via ORAL
  Filled 2019-02-23 (×31): qty 1

## 2019-02-23 MED ORDER — FERROUS SULFATE 325 (65 FE) MG PO TABS: 325.0000 mg | ORAL_TABLET | ORAL | 0 refills | Status: DC

## 2019-02-23 MED ORDER — METRONIDAZOLE 500 MG PO TABS: 500.0000 mg | ORAL_TABLET | Freq: Two times a day (BID) | ORAL | 0 refills | Status: DC

## 2019-02-23 MED ORDER — BISACODYL 10 MG RE SUPP
10.0000 mg | Freq: Every day | RECTAL | Status: DC
  Administered 2019-02-24: 10 mg via RECTAL
  Filled 2019-02-23: qty 1

## 2019-02-23 MED ORDER — DULOXETINE HCL 30 MG PO CPEP
30.0000 mg | ORAL_CAPSULE | Freq: Every day | ORAL | Status: DC
  Administered 2019-02-23 – 2019-03-10 (×16): 30 mg via ORAL
  Filled 2019-02-23 (×16): qty 1

## 2019-02-23 MED ORDER — ACETAMINOPHEN 325 MG PO TABS: 325.0000 mg | ORAL_TABLET | Freq: Four times a day (QID) | ORAL | Status: DC | PRN

## 2019-02-23 MED ORDER — POTASSIUM CHLORIDE CRYS ER 20 MEQ PO TBCR
40.0000 meq | EXTENDED_RELEASE_TABLET | ORAL | Status: DC
  Administered 2019-02-24 – 2019-03-10 (×8): 40 meq via ORAL
  Filled 2019-02-23 (×9): qty 2

## 2019-02-23 MED ORDER — DULOXETINE HCL 30 MG PO CPEP: 30.0000 mg | ORAL_CAPSULE | Freq: Every day | ORAL | 0 refills | Status: DC

## 2019-02-23 MED ORDER — POTASSIUM CHLORIDE CRYS ER 20 MEQ PO TBCR
40.0000 meq | EXTENDED_RELEASE_TABLET | Freq: Once | ORAL | Status: AC
  Administered 2019-02-23: 40 meq via ORAL
  Filled 2019-02-23: qty 2

## 2019-02-23 MED ORDER — CYCLOBENZAPRINE HCL 5 MG PO TABS
5.0000 mg | ORAL_TABLET | Freq: Three times a day (TID) | ORAL | Status: DC | PRN
  Administered 2019-02-23 – 2019-02-28 (×6): 5 mg via ORAL
  Filled 2019-02-23 (×6): qty 1

## 2019-02-23 MED ORDER — FERROUS SULFATE 325 (65 FE) MG PO TABS: 325.0000 mg | ORAL_TABLET | ORAL | Status: DC

## 2019-02-23 MED ORDER — HYDROXYZINE HCL 25 MG PO TABS: 25.0000 mg | ORAL_TABLET | Freq: Three times a day (TID) | ORAL | 0 refills | Status: DC | PRN

## 2019-02-23 MED ORDER — POTASSIUM CHLORIDE CRYS ER 20 MEQ PO TBCR: 40.0000 meq | EXTENDED_RELEASE_TABLET | ORAL | 0 refills | Status: DC
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
## 2019-02-23 NOTE — Progress Notes (Signed)
Patient ID: Tracey Hall, female   DOB: 09/07/1980, 38 y.o.   MRN: [id number] Admit to unit, oriented to rehab, medications and plan of care. States an understanding of information given. Margarito Liner

## 2019-02-23 NOTE — Progress Notes (Signed)
Occupational Therapy Treatment Patient Details Name: Tracey Hall MRN: [id number] DOB: 1981/04/06 Today's Date: 02/23/2019    History of present illness 38 yo female admitted to ED on 8/28 with UTI sepsis, pt now with foley catheter placed. 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 PMH includes UTI sepsis, pyelonephritis, incotinence, chronic pain, obesity, anxiety, depression.   OT comments  Issued level one theraband and performed exercises listed below with therapist controlling resistance.  Pt very happy with this.  Also issued washmitt but did not use due to pt already bathing. Plan is for CIR today  Follow Up Recommendations  CIR    Equipment Recommendations  (defer to next venue)    Recommendations for Other Services      Precautions / Restrictions Precautions Precautions: Fall       Mobility Bed Mobility                  Transfers                      Balance                                           ADL either performed or assessed with clinical judgement   ADL                                         General ADL Comments: issued washmitt; pt had bathed this am     Vision       Perception     Praxis      Cognition Arousal/Alertness: Awake/alert Behavior During Therapy: WFL for tasks assessed/performed Overall Cognitive Status: Within Functional Limits for tasks assessed                                          Exercises Exercises: Other exercises Other Exercises Other Exercises: worked with level one theraband; therapist holding end and threading loop across palm and through webspace at thumb.  RUE, biceps, delts:  ant and middle.  L biceps and 45 degree range of scaption due to weaker triceps (forearm releases at 90).  1 set of 10 each   Shoulder Instructions       General Comments      Pertinent Vitals/ Pain       Pain  Assessment: Faces Faces Pain Scale: Hurts even more Pain Location: legs Pain Descriptors / Indicators: Spasm Pain Intervention(s): (pressure on thighs for spasms)  Home Living                                          Prior Functioning/Environment              Frequency  Min 2X/week        Progress Toward Goals  OT Goals(current goals can now be found in the care plan section)  Progress towards OT goals: Progressing toward goals     Plan      Co-evaluation  AM-PAC OT "6 Clicks" Daily Activity     Outcome Measure   Help from another person eating meals?: A Little Help from another person taking care of personal grooming?: A Little Help from another person toileting, which includes using toliet, bedpan, or urinal?: Total Help from another person bathing (including washing, rinsing, drying)?: A Lot Help from another person to put on and taking off regular upper body clothing?: A Little Help from another person to put on and taking off regular lower body clothing?: Total 6 Click Score: 13    End of Session    OT Visit Diagnosis: Muscle weakness (generalized) (M62.81)   Activity Tolerance Patient tolerated treatment well   Patient Left in bed;with call bell/phone within reach;with bed alarm set   Nurse Communication          Time: [id number] OT Time Calculation (min): 25 min  Charges: OT General Charges $OT Visit: 1 Visit OT Treatments $Therapeutic Exercise: 23-37 mins  Lesle Chris, OTR/L Acute Rehabilitation Services [phone] [facility] pager [phone] office 02/23/2019   [place] 02/23/2019, 9:41 AM
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
## 2019-02-23 NOTE — Progress Notes (Signed)
Inpatient Rehab Admissions Coordinator:   I have approval from Dr. Erlinda Hong and a bed available for pt today.  Will call [facility] and arrange transport.   Shann Medal, PT, DPT Admissions Coordinator [phone] 02/23/19  9:05 AM

## 2019-02-24 ENCOUNTER — Inpatient Hospital Stay (HOSPITAL_COMMUNITY): Payer: Medicare Other | Admitting: Speech Pathology

## 2019-02-24 ENCOUNTER — Inpatient Hospital Stay (HOSPITAL_COMMUNITY): Payer: Medicare Other | Admitting: Physical Therapy

## 2019-02-24 ENCOUNTER — Inpatient Hospital Stay (HOSPITAL_COMMUNITY): Payer: Medicare Other | Admitting: Occupational Therapy

## 2019-02-24 ENCOUNTER — Inpatient Hospital Stay (HOSPITAL_COMMUNITY): Payer: Medicare Other

## 2019-02-24 DIAGNOSIS — M7989 Other specified soft tissue disorders: Secondary | ICD-10-CM

## 2019-02-24 LAB — COMPREHENSIVE METABOLIC PANEL
ALT: 17 U/L (ref 0–44)
AST: 19 U/L (ref 15–41)
Albumin: 3.1 g/dL — ABNORMAL LOW (ref 3.5–5.0)
Alkaline Phosphatase: 72 U/L (ref 38–126)
Anion gap: 10 (ref 5–15)
BUN: 7 mg/dL (ref 6–20)
CO2: 27 mmol/L (ref 22–32)
Calcium: 9.2 mg/dL (ref 8.9–10.3)
Chloride: 100 mmol/L (ref 98–111)
Creatinine, Ser: 0.65 mg/dL (ref 0.44–1.00)
GFR calc Af Amer: >60 mL/min (ref >60)
GFR calc non Af Amer: >60 mL/min (ref >60)
Glucose, Bld: 110 mg/dL — ABNORMAL HIGH (ref 70–99)
Potassium: 3.7 mmol/L (ref 3.5–5.1)
Sodium: 137 mmol/L (ref 135–145)
Total Bilirubin: 0.4 mg/dL (ref 0.3–1.2)
Total Protein: 7.2 g/dL (ref 6.5–8.1)

## 2019-02-24 LAB — CBC WITH DIFFERENTIAL/PLATELET
Abs Immature Granulocytes: 0.04 10*3/uL (ref 0.00–0.07)
Basophils Absolute: 0 10*3/uL (ref 0.0–0.1)
Basophils Relative: 0 %
Eosinophils Absolute: 0.2 10*3/uL (ref 0.0–0.5)
Eosinophils Relative: 2 %
HCT: 29.7 % — ABNORMAL LOW (ref 36.0–46.0)
Hemoglobin: 8.8 g/dL — ABNORMAL LOW (ref 12.0–15.0)
Immature Granulocytes: 0 %
Lymphocytes Relative: 30 %
Lymphs Abs: 2.9 10*3/uL (ref 0.7–4.0)
MCH: 21.7 pg — ABNORMAL LOW (ref 26.0–34.0)
MCHC: 29.6 g/dL — ABNORMAL LOW (ref 30.0–36.0)
MCV: 73.2 fL — ABNORMAL LOW (ref 80.0–100.0)
Monocytes Absolute: 0.6 10*3/uL (ref 0.1–1.0)
Monocytes Relative: 6 %
Neutro Abs: 5.9 10*3/uL (ref 1.7–7.7)
Neutrophils Relative %: 62 %
Platelets: 538 10*3/uL — ABNORMAL HIGH (ref 150–400)
RBC: 4.06 MIL/uL (ref 3.87–5.11)
RDW: 24.4 % — ABNORMAL HIGH (ref 11.5–15.5)
WBC: 9.6 10*3/uL (ref 4.0–10.5)
nRBC: 0 % (ref 0.0–0.2)

## 2019-02-24 MED ORDER — ENOXAPARIN SODIUM 40 MG/0.4ML ~~LOC~~ SOLN
40.0000 mg | SUBCUTANEOUS | Status: DC
  Administered 2019-02-24 – 2019-03-10 (×15): 40 mg via SUBCUTANEOUS
  Filled 2019-02-24 (×15): qty 0.4

## 2019-02-24 MED ORDER — SENNA 8.6 MG PO TABS
2.0000 | ORAL_TABLET | Freq: Two times a day (BID) | ORAL | Status: DC
  Administered 2019-02-24 – 2019-03-10 (×27): 17.2 mg via ORAL
  Filled 2019-02-24 (×28): qty 2

## 2019-02-24 MED ORDER — BISACODYL 10 MG RE SUPP
10.0000 mg | RECTAL | Status: DC
  Administered 2019-02-27: 10 mg via RECTAL
  Filled 2019-02-24 (×3): qty 1

## 2019-02-24 MED ORDER — BACLOFEN 5 MG HALF TABLET
5.0000 mg | ORAL_TABLET | Freq: Three times a day (TID) | ORAL | Status: DC
  Administered 2019-02-24 – 2019-02-26 (×7): 5 mg via ORAL
  Filled 2019-02-24 (×7): qty 1

## 2019-02-24 MED ORDER — ENOXAPARIN SODIUM 40 MG/0.4ML ~~LOC~~ SOLN: 40.0000 mg | SUBCUTANEOUS | Status: DC

## 2019-02-24 MED ORDER — TRAZODONE HCL 50 MG PO TABS
50.0000 mg | ORAL_TABLET | Freq: Every evening | ORAL | Status: DC | PRN
  Administered 2019-02-25 – 2019-03-10 (×10): 50 mg via ORAL
  Filled 2019-02-24 (×10): qty 1

## 2019-02-24 NOTE — Progress Notes (Signed)
Inpatient Rehabilitation  Patient information reviewed and entered into eRehab system by Veronica Fretz M. Shadoe Bethel, M.A., CCC/SLP, PPS Coordinator.  Information including medical coding, functional ability and quality indicators will be reviewed and updated through discharge.    
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
## 2019-02-24 NOTE — Progress Notes (Signed)
Physical Therapy Session Note  Patient Details  Name: Tracey Hall MRN: [id number] Date of Birth: Oct 08, 1980  Today's Date: 02/24/2019 PT Individual Time: 1045-1100 PT Individual Time Calculation (min): 15 min   Short Term Goals: Week 1:   eval note pending  Skilled Therapeutic Interventions/Progress Updates:    Handoff with PT from co-treat session to finish up session. Adjusted patient in w/c with gait belt around thighs to prevent abduction and improve positioning. Power w/c is similar to what she has at home per report but this one seems to not be a tight fit. Adjusted with pillow under feet for better support and skin protection but pt reports it is set up how she uses it at home as well. Education on pressure relief and importance of positioning for skin protection. Pt mod I on unit with power w/c and able to navigate all spaces without assist. Educated and toured unit to orient to rehab process including schedule.   Therapy Documentation Precautions:  Restrictions Weight Bearing Restrictions: No  Pain: Pain Assessment Pain Scale: 0-10 Pain Score: 4  Pain Type: Acute pain Pain Location: Back Pain Orientation: Mid Pain Descriptors / Indicators: Sharp Pain Frequency: Intermittent Pain Onset: On-going Pain Intervention(s): Medication (See eMAR);Repositioned    Therapy/Group: Individual Therapy  Canary Brim Ivory Broad, PT, DPT, CBIS  02/24/2019, 12:09 PM

## 2019-02-24 NOTE — Progress Notes (Signed)
Physical Therapy Session Note  Patient Details  Name: Tracey Hall MRN: [id number] Date of Birth: 12/08/1980  Today's Date: 02/24/2019 PT Individual Time: 1000-1045 PT Individual Time Calculation (min): 45 min   Short Term Goals: Week 1:  PT Short Term Goal 1 (Week 1): Pt will maintain sitting balance EOB with min A consistently PT Short Term Goal 2 (Week 1): Pt will initiate slide board transfer PT Short Term Goal 3 (Week 1): Pt will be able to recall 3 pressure relief techniques  Skilled Therapeutic Interventions/Progress Updates:   Pt received seated in bed, agreeable to PT session. No complaints of pain. Rolling L/R with mod A and use of bedrails to dependently don pants. Supine to sitting EOB with mod A x 2. Pt demos fair sitting balance with min to mod A needed with intermittent use of BUE. Lateral leans L/R with max A to place maximove sling. Maximove transfer bed to w/c. Pt left in care of next PT for w/c mobility.  Therapy Documentation Precautions:  Precautions Precautions: Fall Precaution Comments: quadrapegic Restrictions Weight Bearing Restrictions: No Other Position/Activity Restrictions: does not bear weight    Therapy/Group: Individual Therapy   Excell Seltzer, PT, DPT  02/24/2019, 4:10 PM
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
## 2019-02-24 NOTE — Progress Notes (Signed)
RN was contacted by pharmacy about missing order for DVT prophylaxis. Provider's note states pt to be on Lovenox but order not placed. Provider was notified of the issue, and is aware. Will continue to monitor.

## 2019-02-24 NOTE — Progress Notes (Signed)
[facility] Individual Statement of Services  Patient Name:  Tracey Hall  Date:  02/24/2019  Welcome to the [facility].  Our goal is to provide you with an individualized program based on your diagnosis and situation, designed to meet your specific needs.  With this comprehensive rehabilitation program, you will be expected to participate in at least 3 hours of rehabilitation therapies Monday-Friday, with modified therapy programming on the weekends.  Your rehabilitation program will include the following services:  Physical Therapy (PT), Occupational Therapy (OT), 24 hour per day rehabilitation nursing, Neuropsychology, Case Management (Social Worker), Rehabilitation Medicine, Nutrition Services and Pharmacy Services  Weekly team conferences will be held on Wednesdays to discuss your progress.  Your Social Worker will talk with you frequently to get your input and to update you on team discussions.  Team conferences with you and your family in attendance may also be held.  Expected length of stay: 2 to 3 weeks  Overall anticipated outcome: Minimal to maximal assistance  Depending on your progress and recovery, your program may change. Your Social Worker will coordinate services and will keep you informed of any changes. Your Social Worker's name and contact numbers are listed  below.  The following services may also be recommended but are not provided by the [facility] will be made to provide these services after discharge if needed.  Arrangements include referral to agencies that provide these services.  Your insurance has been verified to be:  Medicare and Medicaid Your primary doctor is:  [facility]  Pertinent information will be shared with your doctor and your insurance company.  Social  Worker:  Alfonse Alpers, LCSW  [phone] or ([phone]  Information discussed with and copy given to patient by: Trey Sailors, 02/24/2019, 6:48 PM

## 2019-02-24 NOTE — Progress Notes (Signed)
LE venous duplex       has been completed. Preliminary results can be found under CV proc through chart review. Lurlene Ronda, BS, RDMS, RVT   

## 2019-02-25 ENCOUNTER — Inpatient Hospital Stay (HOSPITAL_COMMUNITY): Payer: Medicare Other

## 2019-02-25 ENCOUNTER — Inpatient Hospital Stay (HOSPITAL_COMMUNITY): Payer: Medicare Other | Admitting: Physical Therapy

## 2019-02-25 NOTE — Progress Notes (Signed)
[facility] PHYSICAL MEDICINE & REHABILITATION PROGRESS NOTE   Subjective/Complaints:  Pt reports  She's sleepy and doesn't want to get up yet- hasn't noticed a difference with baclofen- no other complaints.   Objective:   Dg Abd Portable 1v  Result Date: 02/23/2019 CLINICAL DATA:  Constipation EXAM: PORTABLE ABDOMEN - 1 VIEW COMPARISON:  Portable exam 1338 hrs compared to 08/13/2017 FINDINGS: Retained contrast in colon. No bowel dilatation or wall thickening. Osseous demineralization. IMPRESSION: Nonobstructive bowel gas pattern. Electronically Signed   By: Lavonia Dana M.D.   On: 02/23/2019 14:02   Vas [place] Lower Extremity Venous (dvt)  Result Date: 02/25/2019  Lower Venous Study Indications: Edema.  Comparison Study: no prior Performing Technologist: June Leap RDMS, RVT  Examination Guidelines: A complete evaluation includes B-mode imaging, spectral Doppler, color Doppler, and power Doppler as needed of all accessible portions of each vessel. Bilateral testing is considered an integral part of a complete examination. Limited examinations for reoccurring indications may be performed as noted.  +---------+---------------+---------+-----------+----------+--------------+ RIGHT    CompressibilityPhasicitySpontaneityPropertiesThrombus Aging +---------+---------------+---------+-----------+----------+--------------+ CFV      Full           Yes      Yes                                 +---------+---------------+---------+-----------+----------+--------------+ SFJ      Full                                                        +---------+---------------+---------+-----------+----------+--------------+ FV Prox                                               Not visualized +---------+---------------+---------+-----------+----------+--------------+ FV Mid   Full                                                         +---------+---------------+---------+-----------+----------+--------------+ FV DistalFull                                                        +---------+---------------+---------+-----------+----------+--------------+ PFV      Full                                                        +---------+---------------+---------+-----------+----------+--------------+ POP      Full           Yes      Yes                                 +---------+---------------+---------+-----------+----------+--------------+ PTV      Full                                                        +---------+---------------+---------+-----------+----------+--------------+  PERO     Full                                                        +---------+---------------+---------+-----------+----------+--------------+   +---------+---------------+---------+-----------+----------+--------------+ LEFT     CompressibilityPhasicitySpontaneityPropertiesThrombus Aging +---------+---------------+---------+-----------+----------+--------------+ CFV      Full           Yes      Yes                                 +---------+---------------+---------+-----------+----------+--------------+ SFJ      Full                                                        +---------+---------------+---------+-----------+----------+--------------+ FV Prox  Full                                                        +---------+---------------+---------+-----------+----------+--------------+ FV Mid   Full                                                        +---------+---------------+---------+-----------+----------+--------------+ FV DistalFull                                                        +---------+---------------+---------+-----------+----------+--------------+ PFV      Full                                                         +---------+---------------+---------+-----------+----------+--------------+ POP      Full           Yes      Yes                                 +---------+---------------+---------+-----------+----------+--------------+ PTV      Full                                                        +---------+---------------+---------+-----------+----------+--------------+ PERO     Full                                                        +---------+---------------+---------+-----------+----------+--------------+  Summary: Right: There is no evidence of deep vein thrombosis in the lower extremity. No cystic structure found in the popliteal fossa. Left: There is no evidence of deep vein thrombosis in the lower extremity. No cystic structure found in the popliteal fossa.  *See table(s) above for measurements and observations. Electronically signed by Harold Barban MD on 02/25/2019 at 1:26:37 AM.    Final    Recent Labs    02/23/19 0450 02/24/19 0925  WBC 10.4 9.6  HGB 8.2* 8.8*  HCT 27.9* 29.7*  PLT 433* 538*   Recent Labs    02/23/19 0450 02/24/19 0925  NA 138 137  K 3.3* 3.7  CL 102 100  CO2 26 27  GLUCOSE 110* 110*  BUN 7 7  CREATININE 0.53 0.65  CALCIUM 8.7* 9.2    Intake/Output Summary (Last 24 hours) at 02/25/2019 1114 Last data filed at 02/25/2019 0445 Gross per 24 hour  Intake 240 ml  Output 850 ml  Net -610 ml     Physical Exam: Vital Signs Blood pressure 112/74, pulse 72, temperature 97.8 F (36.6 C), temperature source Oral, resp. rate 18, height 5\' 6"  (1.676 m), weight 99.3 kg, SpO2 100 %. Physical Exam Nursing note and labsand vitalsreviewed. Constitutional:asleep , in bed, curled up under blanket, hard to wake up, but did with light tactile stimuli, NAD Morbidly obese. Hair intricately styled and colored HENT:  Head:Normocephalicand atraumatic.  Eyes:.Conjunctivaeand EOMare normal.  Neck:Normal range of motion.  Well healed scar left  neck--hypersensitive to touch Cardiovascular:Normal rateand regular rhythm.  Marland Kitchen Respiratory:CTA B/L  GI: She exhibitsno distension. There isno abdominal tenderness.protuberant, soft, (+)BS Healed scars on abdomen--hypersensitive to touch. Musculoskeletal:  Comments: Left shoulder tender with abduction and IR. Impingement maneuver +. Finger flexor tightness. 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 Neurological: She is alertand oriented to person, place, and time. Nocranial nerve deficit. C6 motor level. A little weaker proximal LUE d/t shoulder. 0/5 below level of injury. Did not sense pain or temp below level of injury either. DTR's 3+ bilateral LE Skin: Skin iswarm. She isnot diaphoretic. Noerythema.  Psychiatric: She has anormal mood and affect. Herbehavior is normal.Judgmentand thought contentnormal.  GU- Has a foley currently    Assessment/Plan: 1. Functional deficits secondary to debility from sepsis due to UTI with associated C6 quadriplegia ASIA A which require 3+ hours per day of interdisciplinary therapy in a comprehensive inpatient rehab setting.  Physiatrist is providing close team supervision and 24 hour management of active medical problems listed below.  Physiatrist and rehab team continue to assess barriers to discharge/monitor patient progress toward functional and medical goals  Care Tool:  Bathing              Bathing assist       Upper Body Dressing/Undressing Upper body dressing   What is the patient wearing?: Hospital gown only    Upper body assist Assist Level: Total Assistance - Patient < 25%    Lower Body Dressing/Undressing Lower body dressing      What is the patient wearing?: Underwear/pull up, Pants     Lower body assist Assist for lower body dressing: Total Assistance - Patient < 25%     Toileting Toileting Toileting Activity did  not occur (Clothing management and hygiene only): N/A (no void or bm)  Toileting assist       Transfers Chair/bed transfer  Transfers assist     Chair/bed  transfer assist level: Dependent - Patient 0%     Locomotion Ambulation   Ambulation assist   Ambulation activity did not occur: N/A          Walk 10 feet activity   Assist  Walk 10 feet activity did not occur: N/A        Walk 50 feet activity   Assist Walk 50 feet with 2 turns activity did not occur: N/A         Walk 150 feet activity   Assist Walk 150 feet activity did not occur: N/A         Walk 10 feet on uneven surface  activity   Assist Walk 10 feet on uneven surfaces activity did not occur: N/A         Wheelchair     Assist Will patient use wheelchair at discharge?: Yes Type of Wheelchair: Power    Wheelchair assist level: Independent Max wheelchair distance: 250'    Wheelchair 50 feet with 2 turns activity    Assist        Assist Level: Independent   Wheelchair 150 feet activity     Assist      Assist Level: Independent   Blood pressure 112/74, pulse 72, temperature 97.8 F (36.6 C), temperature source Oral, resp. rate 18, height 5\' 6"  (1.676 m), weight 99.3 kg, SpO2 100 %.  Medical Problem List and Plan: 1.Functional deficitssecondary to debility in the setting of chronic C6 SCI (1999). Pt is power w/c dependent -admit to inpatient rehab -need to acquire w/c from home 2. Antithrombotics: -DVT/anticoagulation:Pharmaceutical:Lovenox -antiplatelet therapy: N/a 3.Chronic pain/Pain Management:Now on cymbalta daily with oxycodone prn. 4. Mood:LCSW to follow for evaluation and support. -antipsychotic agents: N/A 5. Neuropsych: This patientiscapable of making decisions on herown behalf. 6. Skin/Wound Care:Pressure relief measures--maintain adequate nutritonal status. Air mattress to  prevent breakdown. Unna boots to BLE if bullous lesions recurr 7. Fluids/Electrolytes/Nutrition:Monitor I/O. Check lytes in am. Check Mg levels.  8. E coli sepsis: Continue Levaquin --antibiotic day # 7/10-14? 9. Bilateral hydronephrosis/frequent sepsis due to UTI/neurogenic bladder: Continue foley for now.Needs education on B/B program.D/w urology regarding duration/plan of foley  9/4- right now pt interested in foley- will see if foley or suprapubic more appropriate or urinary diversion 10. Neurogenic bowel:Scared to have diarrhea with laxatives and has been refusing miralax--has BM "once a month". Will check KUB for stool burden.Pt tells me she never adopted a bowel or bladder program. "I was 16 at time of my injury and wasn't ready for that". As a consequence, she's had chronic incontinence of stool and bladder. She is very interested in getting on a scheduled program to gain better control.  -will begin senokot-s 2 tabs tonight             -miralax qam  -suppository in AM at 0600 -Dr. Dagoberto Ligas will discuss bowel plan further with patient tomorrow  9/4- will try and move bowel program to night, per pt request- however will not do tonight since already had today. 11. Anemia of chronic disease: Increase iron to daily.  12. Hypokalemia: Will likely need supplement daily as opposed to qod as persistent. Will recheck Mg levels in am. 13. Low protein stores: Will add Prostat. 14. Hx of depression/anxiety:Now on cymbalta with vistaril prn for anxiety. 15. Spasticity- will start baclofen 5 mg TID- will likely need higher doses, but need to start at beginning.   9/5- no difference- expected that- will titrate up every 3 days 16. Dispo- will  determine at team conference     LOS: 2 days [place] 02/25/2019, 11:14 AM

## 2019-02-25 NOTE — Progress Notes (Signed)
Physical Therapy Session Note  Patient Details  Name: Tracey Hall MRN: [id number] Date of Birth: 09/16/1980  Today's Date: 02/25/2019 PT Individual Time: 0900-1000 PT Individual Time Calculation (min): 60 min   Short Term Goals: Week 1:  PT Short Term Goal 1 (Week 1): Pt will maintain sitting balance EOB with min A consistently PT Short Term Goal 2 (Week 1): Pt will initiate slide board transfer PT Short Term Goal 3 (Week 1): Pt will be able to recall 3 pressure relief techniques  Skilled Therapeutic Interventions/Progress Updates:   Pt received supine in bed and agreeable to PT. PT noted BLE in extreme flexion due to spasms overnight. PT performed prolonged stretched into knee extension and neutral hip extension, multiple flexion spasms while attempting to stretch BLE. Rolling R and L with mod assist   Supine>sit transfer with max assist through long sitting. PT assisted pt to don Bra and shirt sitting EOB with mod assist for balance and max assist for clothing management. Siting balacne EOB x 8 min with min assist oveall for safety. PT placed maxi move sling with pt sitting EOB with mod assist for lateral lean onto elbows. And then transported to power WC.   WC mobility in power WC without cues or assist 2 x 241f to and from [employer] PT instructed p tin UE therex with level 3 tband bicep curls x 15, chest press/tricep extension x 12 Bil. Cues for full ROM and decreased speed to improved strengthening of movements.   [facility] to [facility] and [facility] and all needs met.           Therapy Documentation Precautions:  Precautions Precautions: Fall Precaution Comments: quadrapegic Restrictions Weight Bearing Restrictions: No Other Position/Activity Restrictions: does not bear weight Pain: Pain Assessment Pain Scale: 0-10 Pain Score: 0-No pain    Therapy/Group: Individual Therapy  ALorie Phenix9/10/2018, 10:53 AM

## 2019-02-25 NOTE — Progress Notes (Signed)
Occupational Therapy Session Note  Patient Details  Name: Tracey Hall MRN: [id number] Date of Birth: 26-Apr-1981  Today's Date: 02/25/2019 OT Individual Time: [id number] OT Individual Time Calculation (min): 60 min    Short Term Goals: Week 1:  OT Short Term Goal 1 (Week 1): Pt will use bed rails with mod A for rolling for ADL tasks OT Short Term Goal 2 (Week 1): Education on leg loops for management of LEs in power chair/ bed OT Short Term Goal 3 (Week 1): Slide board transfer in ADL bed with mod A +2 for safety to secure bed  Skilled Therapeutic Interventions/Progress Updates:    1:1. Pt received in [place] and steers with MOD I thoruhgout the unit. Pt completes SBT with MOD A for board placement PWC<>EOB. Pt encouraged to scoot length of the mat laterally to work on head hips relationship with MOD A. Pt reporting feeling more comfortable scooting with legs crossed in lotus position. OT and pt discuss BOS and different stresses/implications for requiring core use I.e. smaller BOS increases core demands with LEs down/straight. Pt able to sit EOM with S. Pt educated on sitting in long sitting to stretch hamstrings. Pt sits in long sitting with S and BUE supported, moving to seated figure 4, and circle sitting in prep for LB dressing. Core strenghening- modified sit ups and back extensions with min A 2x5 for sitting balance required for ADLs. Exited session with pt seated in w/c, call light in reach and all need smet  Therapy Documentation Precautions:  Precautions Precautions: Fall Precaution Comments: quadrapegic Restrictions Weight Bearing Restrictions: No Other Position/Activity Restrictions: does not bear weight General:   Vital Signs:   Pain:   ADL: ADL Eating: Set up Where Assessed-Eating: Wheelchair Grooming: Setup, Minimal assistance Where Assessed-Grooming: Wheelchair Upper Body Bathing: Maximal assistance Lower Body Bathing: Maximal assistance Upper Body Dressing:  Maximal assistance Where Assessed-Upper Body Dressing: Bed level Lower Body Dressing: Dependent, Maximal assistance Where Assessed-Lower Body Dressing: Bed level Toileting: Not assessed Toilet Transfer: Not assessed Tub/Shower Transfer: Not assessed Vision   Perception    Praxis   Exercises:   Other Treatments:     Therapy/Group: Individual Therapy  Tonny Branch 02/25/2019, 4:17 PM

## 2019-02-25 NOTE — Progress Notes (Signed)
Pt complaint of contracture in BLE while trying to sleep. Pt requested to have sheets to hold LE down while in bed. Will bring up pts request during shift change Pt currently resting, will continue to monitor.

## 2019-02-25 NOTE — Progress Notes (Signed)
Physical Therapy Session Note  Patient Details  Name: Tracey Hall MRN: [id number] Date of Birth: 03-30-1981  Today's Date: 02/25/2019 PT Individual Time: [id number] PT Individual Time Calculation (min): 62 min   Short Term Goals: Week 1:  PT Short Term Goal 1 (Week 1): Pt will maintain sitting balance EOB with min A consistently PT Short Term Goal 2 (Week 1): Pt will initiate slide board transfer PT Short Term Goal 3 (Week 1): Pt will be able to recall 3 pressure relief techniques  Skilled Therapeutic Interventions/Progress Updates:    Pt received sitting in power w/c and agreeable to therapy session. Reports that at home PTA she would use a piggy-back technique to perform total assist transfers to/from her power w/c. Pt reports that her B UEs (L>R) feel weaker now due to decreased activity while in hospital that may make that transfer difficult. Pt open to learning other transfer techniques to decrease the amount of assistance required to perform bed<>chair transfers and decrease caregiver burden. Performed mod-I power w/c mobility ~114ft x2 to/from therapy gym. Pt set-up w/c for L lateral scoot transfer with assist for removing L armrest (discussed flipping arm rest backwards while still tilted/reclined posteriorly in the w/c for increased pt independence) pt also reports she can only lateral scoot out of her w/c going to the L as her R armrest does not flip backwards due to wires. Performed 2x w/c<>EOM lateral scoot transfers using transfer board with max assist to place board with pt able to perform sufficient lateral leaning with min/mod assist for trunk control in order to raise her hip for easier board placement and mod assist for scooting hips across board and for anterior trunk control - pt requires increased time during transfer to readjust UE positioning prior to initiating scooting and does well communicating with therapist via counting to 3 before initiating a scoot. Sitting EOM  performed bicep curls using wrist weights with 5lb weight on R UE and 3lb weight on L UE - 2 sets to pt fatigue ~10-15 reps. While sitting in w/c with slight posterior tilt performed seated B UE horizontal shoulder adduction exercise (bear hug motion) with 5lb weight on R UE and 3lb weight on L UE 2 sets to fatigue ~8-10 reps each then performed isolated shoulder abduction 2 sets to fatigue ~5-8 reps each. Pt educated on importance of performing full posterior tilt in power w/c for sacral pressure relief every hour. Pt left seated tilted posteriorly in power w/c with needs in reach.   Therapy Documentation Precautions:  Precautions Precautions: Fall Precaution Comments: quadrapegic Restrictions Weight Bearing Restrictions: No Other Position/Activity Restrictions: does not bear weight  Pain: Reports some pain near L shoulder blade due to poor positioning in the bed at night causing muscle tightness - provided repositioning throughout session as needed for comfort.   Therapy/Group: Individual Therapy  Tawana Scale, PT, DPT 02/25/2019, 7:55 AM

## 2019-02-26 DIAGNOSIS — K592 Neurogenic bowel, not elsewhere classified: Secondary | ICD-10-CM | POA: Diagnosis present

## 2019-02-26 MED ORDER — BACLOFEN 5 MG HALF TABLET
5.0000 mg | ORAL_TABLET | Freq: Three times a day (TID) | ORAL | Status: AC
  Administered 2019-02-26 (×2): 5 mg via ORAL
  Filled 2019-02-26 (×2): qty 1

## 2019-02-26 MED ORDER — BACLOFEN 10 MG PO TABS
10.0000 mg | ORAL_TABLET | Freq: Three times a day (TID) | ORAL | Status: DC
  Administered 2019-02-27 – 2019-03-01 (×9): 10 mg via ORAL
  Filled 2019-02-26 (×9): qty 1

## 2019-02-26 NOTE — IPOC Note (Signed)
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)   Neurogenic bowel     Functional Problem List: Nursing Bladder, Bowel, Endurance, Medication Management, Pain, Safety  PT Balance, Endurance, Motor, Perception, Safety, Sensory  OT Balance, Sensory, Endurance, Motor, Other (Comment), Pain, Safety, Skin Integrity  SLP    TR         Basic ADL's: OT Dressing(with education on leg loops)     Advanced  ADL's: OT       Transfers: PT Bed Mobility, Bed to Chair, Car, Furniture, Floor  OT Other (comment)(slide board bed<>power chair)     Locomotion: PT Ambulation, Emergency planning/management officer, Stairs     Additional Impairments: OT Fuctional Use of Upper Extremity  SLP        TR      Anticipated Outcomes Item Anticipated Outcome  Self Feeding n/a  Swallowing      Basic self-care  focus on dressing with max A  Toileting  focus on clothing management with foley management   Bathroom Transfers slide board with caregiver education  Bowel/Bladder  manage bladder with min assist and bowel with min assist  Transfers  min A  Locomotion  mod I at power w/c level  Communication     Cognition     Pain  manage pain at or below level 5  Safety/Judgment  manage safety with cues/reminders   Therapy Plan: PT Intensity: Minimum of 1-2 x/day ,45 to 90 minutes PT Frequency: 5 out of 7 days PT Duration Estimated Length of Stay: 14-18 OT Intensity: Minimum of 1-2 x/day, 45 to 90 minutes OT Frequency: 5 out of 7 days OT Duration/Estimated Length of Stay: ~2 weeks     Due to the current state of emergency, patients may not be receiving their 3-hours of Medicare-mandated therapy.   Team Interventions: Nursing Interventions  Patient/Family Education, Bowel Management, Pain Management, Bladder Management, Disease Management/Prevention, Medication Management, Discharge Planning  PT interventions Balance/vestibular training, Community reintegration, Discharge planning, Disease management/prevention, DME/adaptive equipment instruction, Functional mobility training, Neuromuscular re-education, Patient/family education, Psychosocial support, Skin care/wound management, Therapeutic Activities, Therapeutic Exercise, UE/LE Strength taining/ROM, UE/LE Coordination activities, Wheelchair propulsion/positioning  OT Interventions Balance/vestibular training, Disease mangement/prevention, Neuromuscular re-education, Self Care/advanced ADL retraining, Therapeutic Exercise, Wheelchair propulsion/positioning, DME/adaptive equipment instruction, Pain management, Skin care/wound managment, Community reintegration, Barrister's clerk education, Functional electrical stimulation, Splinting/orthotics, UE/LE Coordination activities, Functional mobility training, Therapeutic Activities  SLP Interventions    TR Interventions    SW/CM Interventions Discharge Planning, Psychosocial Support, Patient/Family Education   Barriers to Discharge MD  Medical stability, Home enviroment access/loayout, Neurogenic bowel and bladder, Weight, Medication compliance and Behavior  Nursing      PT Incontinence, Neurogenic Bowel & Bladder, Weight, Medication compliance    OT      SLP      SW       Team Discharge Planning: Destination: PT-Home ,OT- Home , SLP-  Projected Follow-up: PT-Home health PT, OT-  Home health OT, SLP-  Projected Equipment Needs: PT-To be determined, OT- To be determined, SLP-  Equipment Details: PT-owns power w/c, further equipment TBD, OT-  Patient/family involved in discharge planning: PT- Patient,  OT-Patient, SLP-    MD ELOS: 14-18 days Medical Rehab Prognosis:  Good Assessment: Pt is a 38 yr old female  with C6 ASIA A  quadriplegia admitted with Sepsis due to UTI- at home, she was completely dependent and never participated in rehab 20 yrs ago when sustained SCI- is now willing to develop a bowel and bladder program, and learn to help herself participate in her care, which will improve her overall function and care life long- will work on her function/bowel and bladder program, and 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   See Team Conference Notes for weekly updates to the plan of care

## 2019-02-26 NOTE — Progress Notes (Signed)
[facility] PHYSICAL MEDICINE & REHABILITATION PROGRESS NOTE   Subjective/Complaints:  Pt reports she needs sheets to "tie her legs down" due to spasticity. No other complaints per pt/staff  Objective:   Vas [place] Lower Extremity Venous (dvt)  Result Date: 02/25/2019  Lower Venous Study Indications: Edema.  Comparison Study: no prior Performing Technologist: June Leap RDMS, RVT  Examination Guidelines: A complete evaluation includes B-mode imaging, spectral Doppler, color Doppler, and power Doppler as needed of all accessible portions of each vessel. Bilateral testing is considered an integral part of a complete examination. Limited examinations for reoccurring indications may be performed as noted.  +---------+---------------+---------+-----------+----------+--------------+ RIGHT    CompressibilityPhasicitySpontaneityPropertiesThrombus Aging +---------+---------------+---------+-----------+----------+--------------+ CFV      Full           Yes      Yes                                 +---------+---------------+---------+-----------+----------+--------------+ SFJ      Full                                                        +---------+---------------+---------+-----------+----------+--------------+ FV Prox                                               Not visualized +---------+---------------+---------+-----------+----------+--------------+ FV Mid   Full                                                        +---------+---------------+---------+-----------+----------+--------------+ FV DistalFull                                                        +---------+---------------+---------+-----------+----------+--------------+ PFV      Full                                                        +---------+---------------+---------+-----------+----------+--------------+ POP      Full           Yes      Yes                                  +---------+---------------+---------+-----------+----------+--------------+ PTV      Full                                                        +---------+---------------+---------+-----------+----------+--------------+ PERO     Full                                                        +---------+---------------+---------+-----------+----------+--------------+   +---------+---------------+---------+-----------+----------+--------------+  LEFT     CompressibilityPhasicitySpontaneityPropertiesThrombus Aging +---------+---------------+---------+-----------+----------+--------------+ CFV      Full           Yes      Yes                                 +---------+---------------+---------+-----------+----------+--------------+ SFJ      Full                                                        +---------+---------------+---------+-----------+----------+--------------+ FV Prox  Full                                                        +---------+---------------+---------+-----------+----------+--------------+ FV Mid   Full                                                        +---------+---------------+---------+-----------+----------+--------------+ FV DistalFull                                                        +---------+---------------+---------+-----------+----------+--------------+ PFV      Full                                                        +---------+---------------+---------+-----------+----------+--------------+ POP      Full           Yes      Yes                                 +---------+---------------+---------+-----------+----------+--------------+ PTV      Full                                                        +---------+---------------+---------+-----------+----------+--------------+ PERO     Full                                                         +---------+---------------+---------+-----------+----------+--------------+     Summary: Right: There is no evidence of deep vein thrombosis in the lower extremity. No cystic structure found in the popliteal fossa. Left: There is no evidence of deep vein thrombosis in the lower extremity. No cystic structure found in the popliteal fossa.  *  See table(s) above for measurements and observations. Electronically signed by Harold Barban MD on 02/25/2019 at 1:26:37 AM.    Final    Recent Labs    02/24/19 0925  WBC 9.6  HGB 8.8*  HCT 29.7*  PLT 538*   Recent Labs    02/24/19 0925  NA 137  K 3.7  CL 100  CO2 27  GLUCOSE 110*  BUN 7  CREATININE 0.65  CALCIUM 9.2    Intake/Output Summary (Last 24 hours) at 02/26/2019 1028 Last data filed at 02/26/2019 0551 Gross per 24 hour  Intake 222 ml  Output 825 ml  Net -603 ml     Physical Exam: Vital Signs Blood pressure (!) 162/112, pulse 88, temperature 98.8 F (37.1 C), temperature source Oral, resp. rate 19, height 5\' 6"  (1.676 m), weight 99.3 kg, SpO2 99 %. Physical Exam Nursing note and labsand vitalsreviewed. Constitutional:asleep , in bed, curled up under blanket, hard to wake up, but did with light tactile stimuli, went right back to sleep, NAD  HENT:  Head:Normocephalicand atraumatic.  Eyes:.Conjunctivaeand EOMare normal.  Neck:Normal range of motion.  Well healed scar left neck--hypersensitive to touch Cardiovascular:Normal rateand regular rhythm.  Marland Kitchen Respiratory:CTA B/L  GI: She exhibitsno distension. There isno abdominal tenderness.protuberant, soft, (+)BS Healed scars on abdomen--hypersensitive to touch. Musculoskeletal:  Comments: Left shoulder tender with abduction and IR. Impingement maneuver +. Finger flexor tightness. 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 Legs curls up/in sleep Neurological: She is alertand oriented to  person, place, and time. Nocranial nerve deficit. C6 motor level. A little weaker proximal LUE d/t shoulder. 0/5 below level of injury. Did not sense pain or temp below level of injury either. DTR's 3+ bilateral LE Skin: Skin iswarm. She isnot diaphoretic. Noerythema.  Psychiatric: She has anormal mood and affect. Herbehavior is normal.Judgmentand thought contentnormal.  GU- Has a foley currently    Assessment/Plan: 1. Functional deficits secondary to debility from sepsis due to UTI with associated C6 quadriplegia ASIA A which require 3+ hours per day of interdisciplinary therapy in a comprehensive inpatient rehab setting.  Physiatrist is providing close team supervision and 24 hour management of active medical problems listed below.  Physiatrist and rehab team continue to assess barriers to discharge/monitor patient progress toward functional and medical goals  Care Tool:  Bathing              Bathing assist       Upper Body Dressing/Undressing Upper body dressing   What is the patient wearing?: Hospital gown only    Upper body assist Assist Level: Total Assistance - Patient < 25%    Lower Body Dressing/Undressing Lower body dressing      What is the patient wearing?: Underwear/pull up, Pants     Lower body assist Assist for lower body dressing: Total Assistance - Patient < 25%     Toileting Toileting Toileting Activity did not occur (Clothing management and hygiene only): N/A (no void or bm)  Toileting assist       Transfers Chair/bed transfer  Transfers assist     Chair/bed transfer assist level: Moderate Assistance - Patient 50 - 74%(slide board)     Locomotion Ambulation   Ambulation assist   Ambulation activity did not occur: N/A          Walk 10 feet activity   Assist  Walk 10 feet activity did not occur: N/A  Walk 50 feet activity   Assist Walk 50 feet with 2 turns activity did not occur: N/A         Walk  150 feet activity   Assist Walk 150 feet activity did not occur: N/A         Walk 10 feet on uneven surface  activity   Assist Walk 10 feet on uneven surfaces activity did not occur: N/A         Wheelchair     Assist Will patient use wheelchair at discharge?: Yes Type of Wheelchair: Power    Wheelchair assist level: Independent Max wheelchair distance: 139ft    Wheelchair 50 feet with 2 turns activity    Assist        Assist Level: Independent   Wheelchair 150 feet activity     Assist      Assist Level: Independent   Blood pressure (!) 162/112, pulse 88, temperature 98.8 F (37.1 C), temperature source Oral, resp. rate 19, height 5\' 6"  (1.676 m), weight 99.3 kg, SpO2 99 %.  Medical Problem List and Plan: 1.Functional deficitssecondary to debility in the setting of chronic C6 SCI (1999). Pt is power w/c dependent -admit to inpatient rehab -need to acquire w/c from home 2. Antithrombotics: -DVT/anticoagulation:Pharmaceutical:Lovenox -antiplatelet therapy: N/a 3.Chronic pain/Pain Management:Now on cymbalta daily with oxycodone prn. 4. Mood:LCSW to follow for evaluation and support. -antipsychotic agents: N/A 5. Neuropsych: This patientiscapable of making decisions on herown behalf. 6. Skin/Wound Care:Pressure relief measures--maintain adequate nutritonal status. Air mattress to prevent breakdown. Unna boots to BLE if bullous lesions recurr 7. Fluids/Electrolytes/Nutrition:Monitor I/O. Check lytes in am. Check Mg levels.  8. E coli sepsis: Continue Levaquin --antibiotic day # 7/10-14? 9. Bilateral hydronephrosis/frequent sepsis due to UTI/neurogenic bladder: Continue foley for now.Needs education on B/B program.D/w urology regarding duration/plan of foley  9/4- right now pt interested in foley- will see if foley or suprapubic more appropriate or urinary diversion 10. Neurogenic  bowel:Scared to have diarrhea with laxatives and has been refusing miralax--has BM "once a month". Will check KUB for stool burden.Pt tells me she never adopted a bowel or bladder program. "I was 16 at time of my injury and wasn't ready for that". As a consequence, she's had chronic incontinence of stool and bladder. She is very interested in getting on a scheduled program to gain better control.  -will begin senokot-s 2 tabs tonight             -miralax qam  -suppository in AM at 0600 -Dr. Dagoberto Ligas will discuss bowel plan further with patient tomorrow  9/4- will try and move bowel program to night, per pt request- however will not do tonight since already had today. 11. Anemia of chronic disease: Increase iron to daily.  12. Hypokalemia: Will likely need supplement daily as opposed to qod as persistent. Will recheck Mg levels in am. 13. Low protein stores: Will add Prostat. 14. Hx of depression/anxiety:Now on cymbalta with vistaril prn for anxiety. 15. Spasticity- will start baclofen 5 mg TID- will likely need higher doses, but need to start at beginning.   9/5- no difference- expected that- will titrate up every 3 days  9/6- wrote for Baclofen to increase to 10 mg TID in AM 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 Dispo- will determine at team conference  9/6- pt could make functional improvements- could change quality of life if keep for a little longer  LOS: 3 days A FACE TO FACE EVALUATION WAS PERFORMED  Tracey Hall 02/26/2019, 10:28 AM

## 2019-02-27 ENCOUNTER — Inpatient Hospital Stay (HOSPITAL_COMMUNITY): Payer: Medicare Other | Admitting: Physical Therapy

## 2019-02-27 ENCOUNTER — Inpatient Hospital Stay (HOSPITAL_COMMUNITY): Payer: Medicare Other | Admitting: Occupational Therapy

## 2019-02-27 ENCOUNTER — Inpatient Hospital Stay (HOSPITAL_COMMUNITY): Payer: Medicare Other

## 2019-02-27 DIAGNOSIS — A419 Sepsis, unspecified organism: Secondary | ICD-10-CM

## 2019-02-27 DIAGNOSIS — N319 Neuromuscular dysfunction of bladder, unspecified: Secondary | ICD-10-CM

## 2019-02-27 DIAGNOSIS — D638 Anemia in other chronic diseases classified elsewhere: Secondary | ICD-10-CM

## 2019-02-27 DIAGNOSIS — E876 Hypokalemia: Secondary | ICD-10-CM

## 2019-02-27 DIAGNOSIS — E6609 Other obesity due to excess calories: Secondary | ICD-10-CM

## 2019-02-27 DIAGNOSIS — G8250 Quadriplegia, unspecified: Secondary | ICD-10-CM

## 2019-02-27 DIAGNOSIS — M62838 Other muscle spasm: Secondary | ICD-10-CM

## 2019-02-27 LAB — CBC WITH DIFFERENTIAL/PLATELET
Abs Immature Granulocytes: 0.06 10*3/uL (ref 0.00–0.07)
Basophils Absolute: 0 10*3/uL (ref 0.0–0.1)
Basophils Relative: 0 %
Eosinophils Absolute: 0.2 10*3/uL (ref 0.0–0.5)
Eosinophils Relative: 3 %
HCT: 28.9 % — ABNORMAL LOW (ref 36.0–46.0)
Hemoglobin: 8.7 g/dL — ABNORMAL LOW (ref 12.0–15.0)
Immature Granulocytes: 1 %
Lymphocytes Relative: 33 %
Lymphs Abs: 3 10*3/uL (ref 0.7–4.0)
MCH: 22 pg — ABNORMAL LOW (ref 26.0–34.0)
MCHC: 30.1 g/dL (ref 30.0–36.0)
MCV: 73 fL — ABNORMAL LOW (ref 80.0–100.0)
Monocytes Absolute: 0.8 10*3/uL (ref 0.1–1.0)
Monocytes Relative: 8 %
Neutro Abs: 5 10*3/uL (ref 1.7–7.7)
Neutrophils Relative %: 55 %
Platelets: 605 10*3/uL — ABNORMAL HIGH (ref 150–400)
RBC: 3.96 MIL/uL (ref 3.87–5.11)
RDW: 25.2 % — ABNORMAL HIGH (ref 11.5–15.5)
WBC: 9.1 10*3/uL (ref 4.0–10.5)
nRBC: 0 % (ref 0.0–0.2)

## 2019-02-27 LAB — BASIC METABOLIC PANEL
Anion gap: 11 (ref 5–15)
BUN: 9 mg/dL (ref 6–20)
CO2: 25 mmol/L (ref 22–32)
Calcium: 8.9 mg/dL (ref 8.9–10.3)
Chloride: 99 mmol/L (ref 98–111)
Creatinine, Ser: 0.73 mg/dL (ref 0.44–1.00)
GFR calc Af Amer: >60 mL/min (ref >60)
GFR calc non Af Amer: >60 mL/min (ref >60)
Glucose, Bld: 90 mg/dL (ref 70–99)
Potassium: 3.8 mmol/L (ref 3.5–5.1)
Sodium: 135 mmol/L (ref 135–145)

## 2019-02-27 MED ORDER — CLONAZEPAM 0.5 MG PO TABS
0.5000 mg | ORAL_TABLET | Freq: Three times a day (TID) | ORAL | Status: DC | PRN
  Administered 2019-03-03 – 2019-03-04 (×3): 0.5 mg via ORAL
  Filled 2019-02-27 (×3): qty 1

## 2019-02-27 NOTE — Progress Notes (Signed)
Physical Therapy Session Note  Patient Details  Name: Tracey Hall MRN: [id number] Date of Birth: [birth year]  Today's Date: 02/27/2019 PT Individual Time: 1510-1545 PT Individual Time Calculation (min): 35 min   Short Term Goals: Week 1:  PT Short Term Goal 1 (Week 1): Pt will maintain sitting balance EOB with min A consistently PT Short Term Goal 2 (Week 1): Pt will initiate slide board transfer PT Short Term Goal 3 (Week 1): Pt will be able to recall 3 pressure relief techniques  Skilled Therapeutic Interventions/Progress Updates:     Patient in power chair in room upon PT arrival. Patient alert and agreeable to PT session. Requested to perform w/c<>bed transfer with slide board during session.   Therapeutic Activity: Bed Mobility: Patient performed supine to/from sit in the ADL bed with mod-max A of 1 person for LE and trunk managment. Provided verbal cues for performing side lying to roll to supine when lying down and sliding LEs off the bed before sitting up. Patient attempted to push through her elbows to sit up, but was unable to be successful and pulled up with B UE using PTs hands.  Transfers: Patient performed slide board transfer to/from the ADL bed from the power chair with max A of 2 people and total A for board placement with B LE strapped with a gait belt. Provided verbal cues for board placement, leveling power chair, head-hips relationship, and hand placement throughout. Patient was fearful during transfer due to ending close to the EOB and feeling that she was going to fall forward. Stated she would like to attempt the transfer with the w/c slightly tilted back to have her center of gravity further back during the transfer.   Wheelchair Mobility:  Patient propelled wheelchair 65 feet x2 with supervision. Provided verbal cues for w/c set up for transfers.   Patient in power chair in her room at end of session with breaks locked and all needs within reach.    Therapy  Documentation Precautions:  Precautions Precautions: Fall Precaution Comments: quadrapegic Restrictions Weight Bearing Restrictions: No Other Position/Activity Restrictions: does not bear weight General:   Vital Signs:  Pain: Patient denied pain throughout session.   Therapy/Group: Individual Therapy  Lawson Mahone L Caron Ode PT, DPT  02/27/2019, 5:11 PM

## 2019-02-27 NOTE — Progress Notes (Signed)
Physical Therapy Session Note  Patient Details  Name: Tracey Hall MRN: [id number] Date of Birth: [birth year]  Today's Date: 02/27/2019 PT Individual Time: [id number] PT Individual Time Calculation (min): 60 min   Short Term Goals: Week 1:  PT Short Term Goal 1 (Week 1): Pt will maintain sitting balance EOB with min A consistently PT Short Term Goal 2 (Week 1): Pt will initiate slide board transfer PT Short Term Goal 3 (Week 1): Pt will be able to recall 3 pressure relief techniques  Skilled Therapeutic Interventions/Progress Updates:    Pt received seated in bed, agreeable to PT session. No complaints of pain this AM. Rolling L/R with min A and use of bedrails for placement of maxi-move sling. Maxi-move transfer bed to w/c. Power w/c mobility x 150 ft at mod I level. Pt is able to safely position w/c next to mat table for safe slide board transfer. Pt requires encouragement to sit w/c fully upright for slide board transfer as she normally sits in a reclined position. Slide board transfer w/c to/from mat table with max A. Pt does better with transfer with BLE held together with gait belt and in supported position. Sitting balance EOM close Supervision. Lateral leans L/R x 6 reps each with CGA to return to sitting upright. Mini-crunches x 5 reps with BLE in lotus position for improved sitting balance, min A x 4 reps with total A required for final rep due to LOB to the R. Pt is able to scoot backwards on therapy mat with BLE in lotus position with CGA, requires mod to max A to scoot forwards. Slide board transfer back to w/c. Pt left seated in power w/c in room with needs in reach at end of session.  Therapy Documentation Precautions:  Precautions Precautions: Fall Precaution Comments: quadrapegic Restrictions Weight Bearing Restrictions: No Other Position/Activity Restrictions: does not bear weight    Therapy/Group: Individual Therapy   Excell Seltzer, PT, DPT  02/27/2019, 12:45 PM

## 2019-02-27 NOTE — Progress Notes (Signed)
Occupational Therapy Session Note  Patient Details  Name: Tracey Hall MRN: [id number] Date of Birth: 21-Sep-1980  Today's Date: 02/27/2019 OT Individual Time: [id number] OT Individual Time Calculation (min): 60 min    Short Term Goals: Week 1:  OT Short Term Goal 1 (Week 1): Pt will use bed rails with mod A for rolling for ADL tasks OT Short Term Goal 2 (Week 1): Education on leg loops for management of LEs in power chair/ bed OT Short Term Goal 3 (Week 1): Slide board transfer in ADL bed with mod A +2 for safety to secure bed  Skilled Therapeutic Interventions/Progress Updates:    Pt seen for OT ADL bathing/dressing session. Pt in asleep in supine upon arrival, easily awoken and agreeable to tx session. Denied any formal pain though complaints of LE spasms throughout the night. MD and RN aware, no intervention required at this time. She completed UB/LB bathing/dressing routine from bed level. Pt able to bring herself into circle sit position using hospital bed functions. UB/LB bathing completed at overall set-up/supervision assist, returned to supine for pericare/buttock hygiene to be completed total A. She rolled with mod A using bed rails to assist with bed mobility. She returned to circle sit position and worked on threading LEs into pull-up and pants. Completed with assist for management of catheter and VCs for problem solving advancing pants up LEs once threaded. Returned to supine and rolled with mod A for pants to be pulled up completely. Assist required for fastening bra and pulling shirt down in the back.  Pt requesting to stay in supine at end of session, left with all needs in reach, set-up with breakfast tray.   Therapy Documentation Precautions:  Precautions Precautions: Fall Precaution Comments: quadrapegic Restrictions Weight Bearing Restrictions: No Other Position/Activity Restrictions: does not bear weight   Therapy/Group: Individual Therapy  Veryl Abril L 02/27/2019,  7:04 AM

## 2019-02-27 NOTE — Progress Notes (Signed)
Occupational Therapy Session Note  Patient Details  Name: Tracey Hall MRN: [id number] Date of Birth: 1980-11-27  Today's Date: 02/27/2019 OT Individual Time: 1300-1410 OT Individual Time Calculation (min): 70 min    Short Term Goals: Week 1:  OT Short Term Goal 1 (Week 1): Pt will use bed rails with mod A for rolling for ADL tasks OT Short Term Goal 2 (Week 1): Education on leg loops for management of LEs in power chair/ bed OT Short Term Goal 3 (Week 1): Slide board transfer in ADL bed with mod A +2 for safety to secure bed  Skilled Therapeutic Interventions/Progress Updates:    1:1 Pt reports her husband will be bringing up the chair soon she hopes- as early as today. Discussed SCI bladder management. Pt had questions about suprapubic cath and management of a leg bag.  Read through [employer]. Pt given a leg bag to help conceal when in the community.  pt interested in being able to empty the leg bag when out in the community and also explored options for emptying . Pt able to open/ close the nozzle. Pt also with lots of questions about bowel program/ ostomy bag; discussed processes.    Pt has a roll in shower chair - but since moving into her current house she is not access the shower due to it not being zero entry. Pt says they are working on on outdoor option.  Left pt up in the chair/    Therapy Documentation Precautions:  Precautions Precautions: Fall Precaution Comments: quadrapegic Restrictions Weight Bearing Restrictions: No Other Position/Activity Restrictions: does not bear weight Pain:  no c/o pain   Therapy/Group: Individual Therapy  Willeen Cass [facility] 02/27/2019, 3:14 PM

## 2019-02-27 NOTE — Progress Notes (Signed)
[place] PHYSICAL MEDICINE & REHABILITATION PROGRESS NOTE   Subjective/Complaints: Patient seen sitting up in bed working with therapy this morning.  She states she did not sleep well overnight due to lower extremity spasms.  ROS: Denies CP, shortness of breath, nausea, vomiting, diarrhea.  Objective:   No results found. Recent Labs    02/27/19 0548  WBC 9.1  HGB 8.7*  HCT 28.9*  PLT 605*   Recent Labs    02/27/19 0548  NA 135  K 3.8  CL 99  CO2 25  GLUCOSE 90  BUN 9  CREATININE 0.73  CALCIUM 8.9    Intake/Output Summary (Last 24 hours) at 02/27/2019 1024 Last data filed at 02/27/2019 0800 Gross per 24 hour  Intake 680 ml  Output 1275 ml  Net -595 ml     Physical Exam: Vital Signs Blood pressure 117/77, pulse (!) 101, temperature 99.3 F (37.4 C), temperature source Oral, resp. rate 14, height 5\' 6"  (1.676 m), weight 99.3 kg, SpO2 100 %. Physical Exam Constitutional: No distress . Vital signs reviewed.  Morbidly obese. HENT: Normocephalic.  Atraumatic. Eyes: EOMI. No discharge. Cardiovascular: No JVD. Respiratory: Normal effort.  No stridor. GI: Non-distended. Skin: Warm and dry.  Intact. Psych: Normal mood.  Normal behavior. Musc: Lower extremity contractures Neurological: Alert and oriented C6 motor level.  Bilateral lower extremities: 0/5 proximal distal Sensation absent to light touch bilateral lower extremities Skin: Skin iswarm. She isnot diaphoretic. Noerythema.  Psychiatric: Flat GU- Has a foley currently    Assessment/Plan: 1. Functional deficits secondary to debility from sepsis due to UTI with associated C6 quadriplegia ASIA A which require 3+ hours per day of interdisciplinary therapy in a comprehensive inpatient rehab setting.  Physiatrist is providing close team supervision and 24 hour management of active medical problems listed below.  Physiatrist and rehab team continue to assess barriers to discharge/monitor patient progress  toward functional and medical goals  Care Tool:  Bathing    Body parts bathed by patient: Right arm, Left arm, Chest, Abdomen, Right upper leg, Left upper leg, Right lower leg, Left lower leg, Face   Body parts bathed by helper: Front perineal area, Buttocks     Bathing assist Assist Level: Minimal Assistance - Patient > 75%     Upper Body Dressing/Undressing Upper body dressing   What is the patient wearing?: Bra, Pull over shirt    Upper body assist Assist Level: Maximal Assistance - Patient 25 - 49%    Lower Body Dressing/Undressing Lower body dressing      What is the patient wearing?: Underwear/pull up, Pants     Lower body assist Assist for lower body dressing: Maximal Assistance - Patient 25 - 49%     Toileting Toileting Toileting Activity did not occur (Clothing management and hygiene only): N/A (no void or bm)  Toileting assist       Transfers Chair/bed transfer  Transfers assist     Chair/bed transfer assist level: Moderate Assistance - Patient 50 - 74%(slide board)     Locomotion Ambulation   Ambulation assist   Ambulation activity did not occur: N/A          Walk 10 feet activity   Assist  Walk 10 feet activity did not occur: N/A        Walk 50 feet activity   Assist Walk 50 feet with 2 turns activity did not occur: N/A         Walk 150 feet activity   Assist Walk  150 feet activity did not occur: N/A         Walk 10 feet on uneven surface  activity   Assist Walk 10 feet on uneven surfaces activity did not occur: N/A         Wheelchair     Assist Will patient use wheelchair at discharge?: Yes Type of Wheelchair: Power    Wheelchair assist level: Independent Max wheelchair distance: 189ft    Wheelchair 50 feet with 2 turns activity    Assist        Assist Level: Independent   Wheelchair 150 feet activity     Assist      Assist Level: Independent   Blood pressure 117/77, pulse (!)  101, temperature 99.3 F (37.4 C), temperature source Oral, resp. rate 14, height 5\' 6"  (1.676 m), weight 99.3 kg, SpO2 100 %.  Medical Problem List and Plan: 1.Functional deficitssecondary to debility in the setting of chronic C6 SCI (1999). Pt is power w/c dependent Continue CIR -need to acquire w/c from home 2. Antithrombotics: -DVT/anticoagulation:Pharmaceutical:Lovenox -antiplatelet therapy: N/a 3.Chronic pain/Pain Management:Now on cymbalta daily with oxycodone prn. 4. Mood:LCSW to follow for evaluation and support. -antipsychotic agents: N/A 5. Neuropsych: This patientiscapable of making decisions on herown behalf. 6. Skin/Wound Care:Pressure relief measures--maintain adequate nutritonal status. Air mattress to prevent breakdown. Unna boots to BLE if bullous lesions recurr 7. Fluids/Electrolytes/Nutrition:Monitor I/O.  8. E coli sepsis: Continue Levaquin -- ? Day 11/14. 9. Bilateral hydronephrosis/frequent sepsis due to UTI/neurogenic bladder: Continue foley for now.Needs education on B/B program.  Continue Foley at present, will follow-up regarding long-term plans 10. Neurogenic bowel:Scared to have diarrhea with laxatives and has been refusing miralax--has BM "once a month".  Patient never adopted bowel program  Continue Senokot-s 2 tabs              -miralax qam  Attempting to change bowel program to nightly  11. Anemia of chronic disease: Increase iron to daily.   Hemoglobin 8.7 on 9/7 12. Hypokalemia: Continue supplement  Potassium 3.8 on 9/7   Magnesium 1.9 on 9/3  13. Low protein stores: Added Prostat. 14. Hx of depression/anxiety:Now on cymbalta with vistaril prn for anxiety. 15. 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  Morbid obesity:  Encouraged weight loss   LOS: 4 days A FACE TO FACE EVALUATION WAS PERFORMED  Ankit Lorie Phenix 02/27/2019, 10:24 AM

## 2019-02-27 NOTE — Progress Notes (Signed)
Stayed awake until 0430. Reports spasms are worse when she lays down, that's why she stays up so late.  Declined PRN meds at this time. Foley patent.  Urine amber, cloudy with sediment. Tracey Hall

## 2019-02-27 NOTE — Progress Notes (Signed)
Pt asked to go downstairs and sit outside. PA was called and asked if this was possible. PA informed nurse that per hospital protocol we are no longer allowing pts to go outiside. Pt was informed. Pt proceeded to leave the floor with her husband and stated "she was going to get something to eat and go sit outside" "you can go ahead an D/C me when I get back if you don't like it". Charge nurse was informed.  Doy Hutching, LPN

## 2019-02-28 ENCOUNTER — Inpatient Hospital Stay (HOSPITAL_COMMUNITY): Payer: Medicare Other | Admitting: Physical Therapy

## 2019-02-28 ENCOUNTER — Inpatient Hospital Stay (HOSPITAL_COMMUNITY): Payer: Medicare Other | Admitting: Occupational Therapy

## 2019-02-28 MED ORDER — LIDOCAINE HCL URETHRAL/MUCOSAL 2 % EX GEL
1.0000 "application " | Freq: Once | CUTANEOUS | Status: DC
  Filled 2019-02-28: qty 5

## 2019-02-28 MED ORDER — DOCUSATE SODIUM 283 MG RE ENEM
1.0000 | ENEMA | RECTAL | Status: DC
  Administered 2019-03-03 – 2019-03-06 (×2): 283 mg via RECTAL
  Filled 2019-02-28 (×3): qty 1

## 2019-02-28 NOTE — Progress Notes (Signed)
Pt request to have Peri care and CHG  Done in the morning after breakfast so she could sleep during the night. Pt checked at 0620 she was sleeping no stool noted. Pt currently eating breakfast

## 2019-02-28 NOTE — Progress Notes (Signed)
Occupational Therapy Session Note  Patient Details  Name: Tracey Hall MRN: [id number] Date of Birth: [birth year]  Today's Date: 02/28/2019 OT Individual Time: [id number] OT Individual Time Calculation (min): 75 min    Short Term Goals: Week 1:  OT Short Term Goal 1 (Week 1): Pt will use bed rails with mod A for rolling for ADL tasks OT Short Term Goal 2 (Week 1): Education on leg loops for management of LEs in power chair/ bed OT Short Term Goal 3 (Week 1): Slide board transfer in ADL bed with mod A +2 for safety to secure bed  Skilled Therapeutic Interventions/Progress Updates:    Pt seen for OT ADL bathing/dressing session and session focusing on SCI education. Pt sleeping soundly upon arrival, with increased time awoken and agreeable to tx session.  Completed bathing/dressing from bed level. Increased time required for bed mobility and coming into long sitting due to LE spasms. Completed bathing/dressing from circle sit position with set-up assist for washing UB and LEs. Total A required for pericare/buttock hygiene. UB dressing completed with max A for bra, min A for shirt.. While pt bathing, extensive education provided regarding SCI and functional implications.  Extensive meeting with MD during AM Tracey Hall, participation with OT with education regarding bowel program, SCI, AD, catheter options ( spoke with RN and pt to trial leg bag today) and d/c planning.  Pt left sitting up in bed at end of session, all needs in reach.   Therapy Documentation Precautions:  Precautions Precautions: Fall Precaution Comments: quadrapegic Restrictions Weight Bearing Restrictions: No Other Position/Activity Restrictions: does not bear weight   Therapy/Group: Individual Therapy  Tracey Hall 02/28/2019, 6:58 AM

## 2019-02-28 NOTE — Plan of Care (Signed)
  Problem: Consults Goal: RH SPINAL CORD INJURY PATIENT EDUCATION Description:  See Patient Education module for education specifics.  Outcome: Progressing   Problem: SCI BOWEL ELIMINATION Goal: RH STG MANAGE BOWEL WITH ASSISTANCE Description: STG Manage Bowel with total Assistance. Outcome: Progressing Flowsheets (Taken 02/28/2019 1425) STG: Pt will manage bowels with assistance: 1-Total assistance Goal: RH STG SCI MANAGE BOWEL WITH MEDICATION WITH ASSISTANCE Description: STG SCI Manage bowel with medication with min assistance. Outcome: Progressing   Problem: SCI BLADDER ELIMINATION Goal: RH STG MANAGE BLADDER WITH EQUIPMENT WITH ASSISTANCE Description: STG Manage Bladder With Equipment With total Assistance Outcome: Progressing Flowsheets (Taken 02/28/2019 1425) STG: Pt will manage bladder with equipment with assistance: (foley cath) 1-Total assistance   Problem: RH SKIN INTEGRITY Goal: RH STG SKIN FREE OF INFECTION/BREAKDOWN Description: With min assist Outcome: Progressing   Problem: RH SAFETY Goal: RH STG ADHERE TO SAFETY PRECAUTIONS W/ASSISTANCE/DEVICE Description: STG Adhere to Safety Precautions With cues/reminders Assistance/Device. Outcome: Progressing Flowsheets (Taken 02/28/2019 1425) STG:Pt will adhere to safety precautions with assistance/device: 2-Maximum assistance   Problem: RH PAIN MANAGEMENT Goal: RH STG PAIN MANAGED AT OR BELOW PT'S PAIN GOAL Description: At or below level 5 Outcome: Progressing   Problem: RH KNOWLEDGE DEFICIT SCI Goal: RH STG INCREASE KNOWLEDGE OF SELF CARE AFTER SCI Description: Pt will be able to direct care at discharge using handouts and educational resources with cues/reminders Outcome: Progressing

## 2019-02-28 NOTE — Progress Notes (Addendum)
[place] PHYSICAL MEDICINE & REHABILITATION PROGRESS NOTE   Subjective/Complaints: Patient reports severe problems with autonomic dysreflexia during bowel program last night- admits was nauseated even before started, however. Had cold sweats, tearing up/crying due to horrific HA that even her "hair hurt".  Stomach also hurts when takes iron- refused at home; and stings "forever" when takes lovenox injection- also has L shoulder issues, for 1+ years.  Said bowel program was exhausting due to AD and pain- wants to go back to 1x/month pooping. Swears her BP is "never elevated" at home.   Also upset that is "stuck" in room- wants to go outside.  ROS: Denies CP, shortness of breath, nausea, vomiting, diarrhea.  Objective:   No results found. Recent Labs    02/27/19 0548  WBC 9.1  HGB 8.7*  HCT 28.9*  PLT 605*   Recent Labs    02/27/19 0548  NA 135  K 3.8  CL 99  CO2 25  GLUCOSE 90  BUN 9  CREATININE 0.73  CALCIUM 8.9    Intake/Output Summary (Last 24 hours) at 02/28/2019 1631 Last data filed at 02/28/2019 1325 Gross per 24 hour  Intake 240 ml  Output 550 ml  Net -310 ml     Physical Exam: Vital Signs Blood pressure (!) 113/93, pulse 78, temperature 98.7 F (37.1 C), temperature source Oral, resp. rate 18, height 5\' 6"  (1.676 m), weight 99.3 kg, SpO2 98 %. Physical Exam Constitutional: No distress . Vital signs reviewed.  Morbidly obese. Sitting up in bed, naked, getting self dressed with assistance, frustrated, NAD HENT: Normocephalic.  Atraumatic. Eyes: EOMI. No discharge. Cardiovascular: RRR Respiratory: CTA b/l GI: . Protuberant, less distended, soft, NT, (+)Soft Skin: Warm and dry.  Intact. Psych: Normal mood.  Normal behavior. Musc: Lower extremity contractures Neurological: Alert and oriented C6 motor level.  Bilateral lower extremities: 0/5 proximal distal Sensation absent to light touch bilateral lower extremities Skin: Skin iswarm. She isnot  diaphoretic. Noerythema.  Psychiatric: frustrated GU- Has a foley currently    Assessment/Plan: 1. Functional deficits secondary to debility from sepsis due to UTI with associated C6 quadriplegia ASIA A which require 3+ hours per day of interdisciplinary therapy in a comprehensive inpatient rehab setting.  Physiatrist is providing close team supervision and 24 hour management of active medical problems listed below.  Physiatrist and rehab team continue to assess barriers to discharge/monitor patient progress toward functional and medical goals  Care Tool:  Bathing    Body parts bathed by patient: Right arm, Left arm, Chest, Abdomen, Right upper leg, Left upper leg, Right lower leg, Left lower leg, Face   Body parts bathed by helper: Front perineal area, Buttocks     Bathing assist Assist Level: Minimal Assistance - Patient > 75%     Upper Body Dressing/Undressing Upper body dressing   What is the patient wearing?: Bra, Pull over shirt    Upper body assist Assist Level: Maximal Assistance - Patient 25 - 49%    Lower Body Dressing/Undressing Lower body dressing      What is the patient wearing?: Underwear/pull up, Pants     Lower body assist Assist for lower body dressing: Maximal Assistance - Patient 25 - 49%     Toileting Toileting Toileting Activity did not occur (Clothing management and hygiene only): N/A (no void or bm)  Toileting assist       Transfers Chair/bed transfer  Transfers assist     Chair/bed transfer assist level: Maximal Assistance - Patient 25 -  49%     Locomotion Ambulation   Ambulation assist   Ambulation activity did not occur: N/A          Walk 10 feet activity   Assist  Walk 10 feet activity did not occur: N/A        Walk 50 feet activity   Assist Walk 50 feet with 2 turns activity did not occur: N/A         Walk 150 feet activity   Assist Walk 150 feet activity did not occur: N/A         Walk 10  feet on uneven surface  activity   Assist Walk 10 feet on uneven surfaces activity did not occur: N/A         Wheelchair     Assist Will patient use wheelchair at discharge?: Yes Type of Wheelchair: Power    Wheelchair assist level: Supervision/Verbal cueing Max wheelchair distance: 69'    Wheelchair 50 feet with 2 turns activity    Assist        Assist Level: Supervision/Verbal cueing   Wheelchair 150 feet activity     Assist      Assist Level: Independent   Blood pressure (!) 113/93, pulse 78, temperature 98.7 F (37.1 C), temperature source Oral, resp. rate 18, height 5\' 6"  (1.676 m), weight 99.3 kg, SpO2 98 %.  Medical Problem List and Plan: 1.Functional deficitssecondary to debility in the setting of chronic C6 SCI (1999). Pt is power w/c dependent Continue CIR -need to acquire w/c from home  9/8- spouse brought w/c in 2. Antithrombotics: -DVT/anticoagulation:Pharmaceutical:Lovenox -antiplatelet therapy: N/a 3.Chronic pain/Pain Management:Now on cymbalta daily with oxycodone prn. 4. Mood:LCSW to follow for evaluation and support. -antipsychotic agents: N/A 5. Neuropsych: This patientiscapable of making decisions on herown behalf. 6. Skin/Wound Care:Pressure relief measures--maintain adequate nutritonal status. Air mattress to prevent breakdown. Unna boots to BLE if bullous lesions recurr 7. Fluids/Electrolytes/Nutrition:Monitor I/O.  8. E coli sepsis: Continue Levaquin -- ? Day 11/14. 9. Bilateral hydronephrosis/frequent sepsis due to UTI/neurogenic bladder: Continue foley for now.Needs education on B/B program.  Continue Foley at present, will follow-up regarding long-term plans  9/8- Urology said have pt f/u for urodynamics, but for now, keep foley. 10. Neurogenic bowel:Scared to have diarrhea with laxatives and has been refusing miralax--has BM "once a month".  Patient  never adopted bowel program  Continue Senokot-s 2 tabs              -miralax qam  Attempting to change bowel program to nightly   9/8- will change to 2x/week per pt request; add lidocaine and change suppository to enemeeze 11. Anemia of chronic disease: Increase iron to daily.   Hemoglobin 8.7 on 9/7 12. Hypokalemia: Continue supplement  Potassium 3.8 on 9/7   Magnesium 1.9 on 9/3  13. Low protein stores: Added Prostat. 14. Hx of depression/anxiety:Now on cymbalta with vistaril prn for anxiety. 15. 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  Morbid obesity: Encouraged weight loss 19. Dispo  9/8- will allow pt daily outside passes as long as doesn't miss therapy and lets staff know when leaving floor.  Spent 40 minutes direct contact with pt trying to work out outside pass, bowel program and AD.  LOS: 5 days A FACE TO FACE EVALUATION WAS PERFORMED  Atthew Coutant 02/28/2019, 4:31 PM

## 2019-02-28 NOTE — Progress Notes (Signed)
Physical Therapy Session Note  Patient Details  Name: Tracey Hall MRN: [id number] Date of Birth: [birth year]  Today's Date: 02/28/2019 PT Individual Time: 1000-1045; 1300-1415 PT Individual Time Calculation (min): 45 min and 75 min  Short Term Goals: Week 1:  PT Short Term Goal 1 (Week 1): Pt will maintain sitting balance EOB with min A consistently PT Short Term Goal 2 (Week 1): Pt will initiate slide board transfer PT Short Term Goal 3 (Week 1): Pt will be able to recall 3 pressure relief techniques  Skilled Therapeutic Interventions/Progress Updates:    Session 1: Pt received seated in bed, agreeable to PT session. No complaints of pain. Rolling L/R with min A and use of bedrails to dependently don pants. Supine to sit with mod A with HOB maximally elevated. Sitting balance EOB with BUE support and close SBA. Slide board transfer bed to w/c with mod to max A. Pt is independent with mobility once up in power w/c. Pt is setup A to brush her teeth while seated in w/c at sink. Pt left seated in w/c in room with needs in reach at end of session.  Session 2: Pt received seated in power w/c, agreeable to PT session. No complaints of pain. Power w/c mobility mod I x 150 ft. Pt reports spasms have caused her BLE to come off of footplates throughout the day. Attempted to problem solve various methods of LE positioning, provided gait belt around calf region for improved positioning. Assisted pt with emptying her catheter bag. She is able to unscrew valve with assist to keep valve held upright and with assist to hold urinal to empty bag into. Will continue to work towards pt becoming more independent with catheter management. Discussed home setup and talked through car transfers. Provided pt with home measurement sheet to have her spouse complete in order to better plan furniture and car transfers. Per pt she normally spends time either in bed or her power chair and rarely sits on other furniture. Also  reviewed pressure relief schedule, per pt she has not been performing pressure relief at home but does report she adjusts herself frequently in the chair and performs lateral leans when she becomes uncomfortable. Slide board transfer w/c to/from slightly lower mat table with mod A. Pt demos improved weight shifting and transfer technique this PM. Pt left seated in power w/c in room with needs in reach at end of session.  Therapy Documentation Precautions:  Precautions Precautions: Fall Precaution Comments: quadrapegic Restrictions Weight Bearing Restrictions: No Other Position/Activity Restrictions: does not bear weight    Therapy/Group: Individual Therapy   Excell Seltzer, PT, DPT  02/28/2019, 12:50 PM

## 2019-03-01 ENCOUNTER — Inpatient Hospital Stay (HOSPITAL_COMMUNITY): Payer: Medicare Other | Admitting: Physical Therapy

## 2019-03-01 ENCOUNTER — Inpatient Hospital Stay (HOSPITAL_COMMUNITY): Payer: Medicare Other | Admitting: Occupational Therapy

## 2019-03-01 NOTE — Progress Notes (Signed)
[facility] PHYSICAL MEDICINE & REHABILITATION PROGRESS NOTE   Subjective/Complaints: Patient reports ling night with bad spasms/tightness.  Most nights has bad spasms.   Also discussed bladder management- explained previous reflex voiding concerns me- would rather pt had foley, suprapubic or cath- cathing is less likely due to pt's size.  She's also asking about bladder mgmt and sex- and how to manage that- explained most pts tell me they'd rather remove foley short term than have suprapubic because of bumping against it during intimacy.  Also, pt asking how quads can lose weight- explained it's usually decreasing caloric intake- she's interested in weight loss meds- would rather do oupt than inpt, so can focus on therapy here.  Also, pt using leg bag and learning to empty on her own with OT.  ROS: Denies CP, shortness of breath, nausea, vomiting, diarrhea.  Objective:   No results found. Recent Labs    02/27/19 0548  WBC 9.1  HGB 8.7*  HCT 28.9*  PLT 605*   Recent Labs    02/27/19 0548  NA 135  K 3.8  CL 99  CO2 25  GLUCOSE 90  BUN 9  CREATININE 0.73  CALCIUM 8.9    Intake/Output Summary (Last 24 hours) at 03/01/2019 1028 Last data filed at 02/28/2019 2230 Gross per 24 hour  Intake 240 ml  Output 550 ml  Net -310 ml     Physical Exam: Vital Signs Blood pressure (!) 151/87, pulse 98, temperature 98.7 F (37.1 C), temperature source Oral, resp. rate 19, height 5\' 6"  (1.676 m), weight 99.3 kg, SpO2 100 %. Physical Exam Constitutional: No distress . Vital signs reviewed.  Morbidly obese. Lying supine in bed, legs curled up under her; of note, legs tied with sheets to try and keep them down, OT in room, NAD HENT: Normocephalic.  Atraumatic. Eyes: EOMI. No discharge. Cardiovascular: RRR Respiratory: CTA b/l GI: . Protuberant, NDd, soft, NT, (+)Soft Skin: Warm and dry.  Intact. Psych: Normal mood.  Normal behavior. Musc: Lower extremity contractures and legs  curled underneath her- severe spasms seen when attempted to straighten legs- took ~ 3-4 minutes to straighten legs to keep them there without drawing up. Neurological: Alert and oriented C6 motor level.  Bilateral lower extremities: 0/5 proximal distal Sensation absent to light touch bilateral lower extremities Skin: Skin iswarm. She isnot diaphoretic. Noerythema.  Psychiatric: brighter affect GU- Has a foley currently    Assessment/Plan: 1. Functional deficits secondary to debility from sepsis due to UTI with associated C6 quadriplegia ASIA A which require 3+ hours per day of interdisciplinary therapy in a comprehensive inpatient rehab setting.  Physiatrist is providing close team supervision and 24 hour management of active medical problems listed below.  Physiatrist and rehab team continue to assess barriers to discharge/monitor patient progress toward functional and medical goals  Care Tool:  Bathing    Body parts bathed by patient: Right arm, Left arm, Chest, Abdomen, Right upper leg, Left upper leg, Right lower leg, Left lower leg, Face   Body parts bathed by helper: Front perineal area, Buttocks     Bathing assist Assist Level: Minimal Assistance - Patient > 75%     Upper Body Dressing/Undressing Upper body dressing   What is the patient wearing?: Bra, Pull over shirt    Upper body assist Assist Level: Moderate Assistance - Patient 50 - 74%    Lower Body Dressing/Undressing Lower body dressing      What is the patient wearing?: Underwear/pull up, Pants  Lower body assist Assist for lower body dressing: Total Assistance - Patient < 25%     Toileting Toileting Toileting Activity did not occur Landscape architect and hygiene only): N/A (no void or bm)  Toileting assist Assist for toileting: 2 Helpers(Per LPN report )     Transfers Chair/bed transfer  Transfers assist     Chair/bed transfer assist level: 2 Helpers      Locomotion Ambulation   Ambulation assist   Ambulation activity did not occur: N/A          Walk 10 feet activity   Assist  Walk 10 feet activity did not occur: N/A        Walk 50 feet activity   Assist Walk 50 feet with 2 turns activity did not occur: N/A         Walk 150 feet activity   Assist Walk 150 feet activity did not occur: N/A         Walk 10 feet on uneven surface  activity   Assist Walk 10 feet on uneven surfaces activity did not occur: N/A         Wheelchair     Assist Will patient use wheelchair at discharge?: Yes Type of Wheelchair: Power    Wheelchair assist level: Supervision/Verbal cueing Max wheelchair distance: 44'    Wheelchair 50 feet with 2 turns activity    Assist        Assist Level: Supervision/Verbal cueing   Wheelchair 150 feet activity     Assist      Assist Level: Independent   Blood pressure (!) 151/87, pulse 98, temperature 98.7 F (37.1 C), temperature source Oral, resp. rate 19, height 5\' 6"  (1.676 m), weight 99.3 kg, SpO2 100 %.  Medical Problem List and Plan: 1.Functional deficitssecondary to debility in the setting of chronic C6 SCI (1999). Pt is power w/c dependent Continue CIR -need to acquire w/c from home  9/8- spouse brought w/c in 2. Antithrombotics: -DVT/anticoagulation:Pharmaceutical:Lovenox -antiplatelet therapy: N/a 3.Chronic pain/Pain Management:Now on cymbalta daily with oxycodone prn. 4. Mood:LCSW to follow for evaluation and support. -antipsychotic agents: N/A 5. Neuropsych: This patientiscapable of making decisions on herown behalf. 6. Skin/Wound Care:Pressure relief measures--maintain adequate nutritonal status. Air mattress to prevent breakdown. Unna boots to BLE if bullous lesions recurr 7. Fluids/Electrolytes/Nutrition:Monitor I/O.  8. E coli sepsis: Continue Levaquin -- ? Day 11/14. 9.  Bilateral hydronephrosis/frequent sepsis due to UTI/neurogenic bladder: Continue foley for now.Needs education on B/B program.  Continue Foley at present, will follow-up regarding long-term plans  9/8- Urology said have pt f/u for urodynamics, but for now, keep foley.  9/9- discussed plan of bladder, and explained to pt- working on leg bag and learning to empty it. 10. Neurogenic bowel:Scared to have diarrhea with laxatives and has been refusing miralax--has BM "once a month".  Patient never adopted bowel program  Continue Senokot-s 2 tabs              -miralax qam  Attempting to change bowel program to nightly   9/8- will change to 2x/week per pt request; add lidocaine and change suppository to enemeeze 11. Anemia of chronic disease: Increase iron to daily.   Hemoglobin 8.7 on 9/7 12. Hypokalemia: Continue supplement  Potassium 3.8 on 9/7   Magnesium 1.9 on 9/3  13. Low protein stores: Added Prostat. 14. Hx of depression/anxiety:Now on cymbalta with vistaril prn for anxiety. 15. Spasticity/spasms-   Baclofen increased to 10 mg 3 times daily on 9/7  9/9- will  increase Baclofen tomorrow again to 15 mg TID- might start seeing changes soon. 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  Morbid obesity: Encouraged weight loss  9/9- will try weight loss meds outpt. 19. Dispo  9/8- will allow pt daily outside passes as long as doesn't miss therapy and lets staff know when leaving floor.   LOS: 6 days A FACE TO FACE EVALUATION WAS PERFORMED  Jaycee Pelzer 03/01/2019, 10:28 AM

## 2019-03-01 NOTE — Progress Notes (Signed)
Physical Therapy Session Note  Patient Details  Name: Tracey Hall MRN: [id number] Date of Birth: 1980/08/15  Today's Date: 03/01/2019 PT Individual Time: 1000-1100 PT Individual Time Calculation (min): 60 min   Short Term Goals: Week 1:  PT Short Term Goal 1 (Week 1): Pt will maintain sitting balance EOB with min A consistently PT Short Term Goal 2 (Week 1): Pt will initiate slide board transfer PT Short Term Goal 3 (Week 1): Pt will be able to recall 3 pressure relief techniques  Skilled Therapeutic Interventions/Progress Updates:    Pt received seated in power w/c in room, agreeable to PT session. No complaints of pain. Power w/c mobility at mod I level throughout session. Slide board transfer w/c to/from level mat table with mod A, problem solved therapist hand placement and patient hand placement during transfer for ideal weight shifting. Per pt her bed at home is 30" (+), she will have her husband measure it. Pt does not have an adjustable height bed and would likely not be safe to transfer from her w/c into her bed due to significant height difference. Car transfer w/c to level height car with assist x 2 for slide board transfer and BLE management. Per pt her car at home is also significantly higher than her w/c seat height. Will continue to problem solve best transfers for pt to utilize upon d/c home. Pt left seated in power w/c in room with needs in reach at end of session.  Therapy Documentation Precautions:  Precautions Precautions: Fall Precaution Comments: quadrapegic Restrictions Weight Bearing Restrictions: No Other Position/Activity Restrictions: does not bear weight    Therapy/Group: Individual Therapy   Excell Seltzer, PT, DPT  03/01/2019, 12:43 PM

## 2019-03-01 NOTE — Progress Notes (Signed)
Pt. Left the floor at 1600 and she has not been back to the floor yet.

## 2019-03-01 NOTE — Progress Notes (Signed)
Occupational Therapy Session Note  Patient Details  Name: Tracey Hall MRN: [id number] Date of Birth: [birth year]  Today's Date: 03/01/2019 OT Individual Time: 1400-1500 OT Individual Time Calculation (min): 60 min    Short Term Goals: Week 1:  OT Short Term Goal 1 (Week 1): Pt will use bed rails with mod A for rolling for ADL tasks OT Short Term Goal 2 (Week 1): Education on leg loops for management of LEs in power chair/ bed OT Short Term Goal 3 (Week 1): Slide board transfer in ADL bed with mod A +2 for safety to secure bed  Skilled Therapeutic Interventions/Progress Updates:    Pt seen for OT session focusing on self-care re-training:  Self-care Re-training: Practiced pt emptying leg bag. Assist for set-up of equipment and holding urinal for pt to empty bag into. Mod A overall, pt able to manipulate faucet for releasing urine and re-tightening.  Pt provided with B leg loops. Education/demonstration provided regarding use and functional implications. Able to use with min A from supine and long sitting position.  Education for quad coughing and upright positioning to facilitate diaphragmatic breathing. Pt requesting abdominal binder to facilitate posture/positioning.   Transfers: Sliding board transfer power w/c <> EOM, mod A with +2 for safety. Trialed transfer method with caregiver in front on first trial and second trial with caregiver behind pt, pt still trying to determine transfer method she prefers, though did like the posterior approach. Mod A to return to supine on mat with increased time and pain due to LE spasms during transfer portion.  Max A supine>long sitting and pt using leg loopsto assist with management of B LEs with min A to advance off EOM.   Sitting balance EOM with supervision and B UE support  Pt returned to room at end of session, left seated in power w/c set-up with meal tray and all needs in reach.    Therapy Documentation Precautions:   Precautions Precautions: Fall Precaution Comments: quadrapegic Restrictions Weight Bearing Restrictions: No Other Position/Activity Restrictions: does not bear weight   Therapy/Group: Individual Therapy  Nethra Mehlberg L 03/01/2019, 3:13 PM

## 2019-03-01 NOTE — Progress Notes (Signed)
Pt. Left the floor at 1054 AM. And came back at 1330.

## 2019-03-01 NOTE — Progress Notes (Signed)
Occupational Therapy Session Note  Patient Details  Name: Tracey Hall MRN: [id number] Date of Birth: June 27, 1980  Today's Date: 03/01/2019 OT Individual Time: [id number] OT Individual Time Calculation (min): 75 min    Short Term Goals: Week 1:  OT Short Term Goal 1 (Week 1): Pt will use bed rails with mod A for rolling for ADL tasks OT Short Term Goal 2 (Week 1): Education on leg loops for management of LEs in power chair/ bed OT Short Term Goal 3 (Week 1): Slide board transfer in ADL bed with mod A +2 for safety to secure bed  Skilled Therapeutic Interventions/Progress Updates:    1:1 Performed bathing and dressing at bed level with continual SCI education. Discussed with pt and MD- longer term bladder concerns including a bladder study. Cathing V suprapubic cath v foley- management of bag. IADL discussion of sex with catheter.   Pt able to roll in bed with min A for positioning of LEs with bed rails for clothing management.  Did try a new strategy of donning a bra with fasten bra first and then donning like a sports bra. Worked on adjusting breast herself with A for postioning of fabric and hand.   Pt came into upright sitting with bed features. Legs adducted with gait belt and transitioned to EOB with max A with pt initiating weight shifts. Slide board transfer from bed to her powerchair with max A with +2 person holding board in place. Pt able to use momentum when counting to assist with sliding.   Other than cracking hard boiled egg pt able to setup her own tray.   Therapy Documentation Precautions:  Precautions Precautions: Fall Precaution Comments: quadrapegic Restrictions Weight Bearing Restrictions: No Other Position/Activity Restrictions: does not bear weight Pain:  no c/o pain    Therapy/Group: Individual Therapy  Willeen Cass [place] 03/01/2019, 10:05 AM

## 2019-03-02 ENCOUNTER — Inpatient Hospital Stay (HOSPITAL_COMMUNITY): Payer: Medicare Other | Admitting: Occupational Therapy

## 2019-03-02 ENCOUNTER — Inpatient Hospital Stay (HOSPITAL_COMMUNITY): Payer: Medicare Other | Admitting: Physical Therapy

## 2019-03-02 ENCOUNTER — Inpatient Hospital Stay (HOSPITAL_COMMUNITY): Payer: Medicare Other | Admitting: *Deleted

## 2019-03-02 MED ORDER — BACLOFEN 5 MG HALF TABLET
15.0000 mg | ORAL_TABLET | Freq: Three times a day (TID) | ORAL | Status: DC
  Administered 2019-03-02 – 2019-03-06 (×11): 15 mg via ORAL
  Filled 2019-03-02 (×13): qty 1

## 2019-03-02 MED ORDER — BACLOFEN 5 MG HALF TABLET: 15.0000 mg | ORAL_TABLET | Freq: Three times a day (TID) | ORAL | Status: DC

## 2019-03-02 NOTE — Progress Notes (Signed)
[facility] PHYSICAL MEDICINE & REHABILITATION PROGRESS NOTE   Subjective/Complaints: Patient reports no change in spasms of legs at night.  Asking to get abd binder to help with positioning/sititng up in w/c.  ROS: Denies CP, shortness of breath, nausea, vomiting, diarrhea.  Objective:   No results found. No results for input(s): WBC, HGB, HCT, PLT in the last 72 hours. No results for input(s): NA, K, CL, CO2, GLUCOSE, BUN, CREATININE, CALCIUM in the last 72 hours.  Intake/Output Summary (Last 24 hours) at 03/02/2019 0920 Last data filed at 03/02/2019 0556 Gross per 24 hour  Intake 240 ml  Output 700 ml  Net -460 ml     Physical Exam: Vital Signs Blood pressure 112/72, pulse 77, temperature 97.6 F (36.4 C), temperature source Oral, resp. rate 18, height 5\' 6"  (1.676 m), weight 99.3 kg, SpO2 97 %. Physical Exam Constitutional: No distress . Vital signs reviewed.  Morbidly obese. Lying supine in bed, legs straightened out on bed, OT in room, NT also in room; sleepy; NAD HENT: Normocephalic.  Atraumatic. Eyes: EOMI. No discharge. Cardiovascular: RRR Respiratory: CTA b/l GI: . Protuberant, NDd, soft, NT, (+)Soft Skin: Warm and dry.  Intact. Psych: Normal mood.  Normal behavior. Musc: Lower extremity contractures and legs curle at ankles- in PF contractures; also intermittent spasms seen of LEs; hands/fingers curled at rest Neurological: Alert and oriented C6 motor level.  Bilateral lower extremities: 0/5 proximal distal Sensation absent to light touch bilateral lower extremities Skin: Skin iswarm. She isnot diaphoretic. Noerythema.  Psychiatric: brighter affect GU- Has a foley currently    Assessment/Plan: 1. Functional deficits secondary to debility from sepsis due to UTI with associated C6 quadriplegia ASIA A which require 3+ hours per day of interdisciplinary therapy in a comprehensive inpatient rehab setting.  Physiatrist is providing close team supervision and  24 hour management of active medical problems listed below.  Physiatrist and rehab team continue to assess barriers to discharge/monitor patient progress toward functional and medical goals  Care Tool:  Bathing    Body parts bathed by patient: Right arm, Left arm, Chest, Abdomen, Right upper leg, Left upper leg, Right lower leg, Left lower leg, Face   Body parts bathed by helper: Front perineal area, Buttocks     Bathing assist Assist Level: Minimal Assistance - Patient > 75%     Upper Body Dressing/Undressing Upper body dressing   What is the patient wearing?: Bra, Pull over shirt    Upper body assist Assist Level: Moderate Assistance - Patient 50 - 74%    Lower Body Dressing/Undressing Lower body dressing      What is the patient wearing?: Underwear/pull up, Pants     Lower body assist Assist for lower body dressing: Total Assistance - Patient < 25%     Toileting Toileting Toileting Activity did not occur Landscape architect and hygiene only): N/A (no void or bm)  Toileting assist Assist for toileting: 2 Helpers(Per LPN report )     Transfers Chair/bed transfer  Transfers assist     Chair/bed transfer assist level: Moderate Assistance - Patient 50 - 74%     Locomotion Ambulation   Ambulation assist   Ambulation activity did not occur: N/A          Walk 10 feet activity   Assist  Walk 10 feet activity did not occur: N/A        Walk 50 feet activity   Assist Walk 50 feet with 2 turns activity did not occur: N/A  Walk 150 feet activity   Assist Walk 150 feet activity did not occur: N/A         Walk 10 feet on uneven surface  activity   Assist Walk 10 feet on uneven surfaces activity did not occur: N/A         Wheelchair     Assist Will patient use wheelchair at discharge?: Yes Type of Wheelchair: Power    Wheelchair assist level: Independent Max wheelchair distance: 150'    Wheelchair 50 feet with 2  turns activity    Assist        Assist Level: Independent   Wheelchair 150 feet activity     Assist      Assist Level: Independent   Blood pressure 112/72, pulse 77, temperature 97.6 F (36.4 C), temperature source Oral, resp. rate 18, height 5\' 6"  (1.676 m), weight 99.3 kg, SpO2 97 %.  Medical Problem List and Plan: 1.Functional deficitssecondary to debility in the setting of chronic C6 SCI (1999). Pt is power w/c dependent Continue CIR -need to acquire w/c from home  9/8- spouse brought w/c in  9/10- will order Abd binder for pt to sit up/better posture 2. Antithrombotics: -DVT/anticoagulation:Pharmaceutical:Lovenox -antiplatelet therapy: N/a 3.Chronic pain/Pain Management:Now on cymbalta daily with oxycodone prn. 4. Mood:LCSW to follow for evaluation and support. -antipsychotic agents: N/A 5. Neuropsych: This patientiscapable of making decisions on herown behalf. 6. Skin/Wound Care:Pressure relief measures--maintain adequate nutritonal status. Air mattress to prevent breakdown. Unna boots to BLE if bullous lesions recurr 7. Fluids/Electrolytes/Nutrition:Monitor I/O.  8. E coli sepsis: Continue Levaquin -- ? Day 11/14. 9. Bilateral hydronephrosis/frequent sepsis due to UTI/neurogenic bladder: Continue foley for now.Needs education on B/B program.  Continue Foley at present, will follow-up regarding long-term plans  9/8- Urology said have pt f/u for urodynamics, but for now, keep foley.  9/9- discussed plan of bladder, and explained to pt- working on leg bag and learning to empty it. 10. Neurogenic bowel:Scared to have diarrhea with laxatives and has been refusing miralax--has BM "once a month".  Patient never adopted bowel program  Continue Senokot-s 2 tabs              -miralax qam  Attempting to change bowel program to nightly   9/8- will change to 2x/week per pt  request; add lidocaine and change suppository to enemeeze 11. Anemia of chronic disease: Increase iron to daily.   Hemoglobin 8.7 on 9/7 12. Hypokalemia: Continue supplement  Potassium 3.8 on 9/7   Magnesium 1.9 on 9/3  13. Low protein stores: Added Prostat. 14. Hx of depression/anxiety:Now on cymbalta with vistaril prn for anxiety. 15. Spasticity/spasms-   Baclofen increased to 10 mg 3 times daily on 9/7  9/9- will increase Baclofen tomorrow again to 15 mg TID- might start seeing changes soon.  9/10- will increase Baclofen to 15 mg TID and see how it goes- might need Zanaflex as well.  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  Morbid obesity: Encouraged weight loss  9/9- will try weight loss meds outpt. 19. Dispo  9/8- will allow pt daily outside passes as long as doesn't miss therapy and lets staff know when leaving floor.   LOS: 7 days A FACE TO FACE EVALUATION WAS PERFORMED  Tracey Hall 03/02/2019, 9:20 AM
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
## 2019-03-02 NOTE — Progress Notes (Signed)
Physical Therapy Session Note  Patient Details  Name: Tracey Hall MRN: [id number] Date of Birth: 1981/03/10  Today's Date: 03/02/2019 PT Individual Time: 1100-1200 PT Individual Time Calculation (min): 60 min   Short Term Goals: Week 1:  PT Short Term Goal 1 (Week 1): Pt will maintain sitting balance EOB with min A consistently PT Short Term Goal 2 (Week 1): Pt will initiate slide board transfer PT Short Term Goal 3 (Week 1): Pt will be able to recall 3 pressure relief techniques  Skilled Therapeutic Interventions/Progress Updates:    Pt received seated in power w/c in room, agreeable to PT session. No complaints of pain. Power w/c mobility mod I. Slide board transfer w/c to/from mat table with mod A. Session focus on management of BLE via use of leg lifters. Pt typically places her LE in lotus position when seated EOM vs sitting in long-sitting position. Assisted pt with utilizing leg lifters to lift LE into lotus position. Pt requires min to mod A to lift LE via use of leg lifters. Pt occasionally requires min A for trunk control to maintain sitting balance while reaching for LE. Pt also requires assist to position her hand/wrist inside leg loop. Once seated in lotus position EOM pt is able to turn herself 90 degrees on the mat to face another direction and then bring LE into circle sitting position. Pt is able to return to lotus position and then to EOM with min A for LE management. Discussed energy conservation techniques and how much energy pt used attempting this type of mobility. Education with patient about preserving energy for meaningful tasks. Pt left seated in power w/c in room with needs in reach at end of session.  Therapy Documentation Precautions:  Precautions Precautions: Fall Precaution Comments: quadrapegic Restrictions Weight Bearing Restrictions: No Other Position/Activity Restrictions: does not bear weight    Therapy/Group: Individual Therapy   Excell Seltzer,  PT, DPT  03/02/2019, 12:41 PM

## 2019-03-02 NOTE — Progress Notes (Addendum)
Occupational Therapy Session Note  Patient Details  Name: Tracey Hall MRN: [id number] Date of Birth: 1981/04/26  Today's Date: 03/02/2019 OT Individual Time: [id number] Individual Time Calculation (min): 80 min    Short Term Goals: Week 1:  OT Short Term Goal 1 (Week 1): Pt will use bed rails with mod A for rolling for ADL tasks OT Short Term Goal 2 (Week 1): Education on leg loops for management of LEs in power chair/ bed OT Short Term Goal 3 (Week 1): Slide board transfer in ADL bed with mod A +2 for safety to secure bed  Skilled Therapeutic Interventions/Progress Updates:    Pt seen for OT ADL bathing/dressing session. Pt asleep in supine upon arrival, with increased time awoken and agreeable to tx session. No complaints of painn though reports increased LE spasms throughout the night.  Pt used hospital bed functions to raise [name], able to transition to long sitting without using hospital rails in simulation of home environment with pillows propped behind pt to have her sitting at 90 degrees.  Pt did become diaphoretic during bed level ADLs, BP 135/95, no other s/s of distress, pt repositioned and returned to normal limits. Pt able to manage LEs into circle sit position and bathed UB/LB with supervision/set-up with exception of buttock/perciare. She threaded pants with min-mod A for clothing management in circle sit position to advance pants  Up legs. Returned to supine and rolled with mod A using bed rails in order to have pants pulled up total A.  Leg loops donned total A and pt able to use leg loops to advance LEs independently to EOB in prep for transfer. Mod A sliding board transfer to power w/c. Gait belt used for positioning of LEs.  Pt left seated in power w/c, set-up with breakfast and all needs in reach.   Therapy Documentation Precautions:  Precautions Precautions: Fall Precaution Comments: quadrapegic Restrictions Weight Bearing Restrictions: No Other Position/Activity  Restrictions: does not bear weight   Therapy/Group: Individual Therapy  Tracey Hall L 03/02/2019, 6:29 AM

## 2019-03-02 NOTE — Progress Notes (Signed)
Occupational Therapy Session Note  Patient Details  Name: Tracey Hall MRN: [id number] Date of Birth: 06-26-1980  Today's Date: 03/02/2019 OT Individual Time: 1345-1500 OT Individual Time Calculation (min): 75 min    Short Term Goals: Week 1:  OT Short Term Goal 1 (Week 1): Pt will use bed rails with mod A for rolling for ADL tasks OT Short Term Goal 2 (Week 1): Education on leg loops for management of LEs in power chair/ bed OT Short Term Goal 3 (Week 1): Slide board transfer in ADL bed with mod A +2 for safety to secure bed Week 2:     Skilled Therapeutic Interventions/Progress Updates:    1:1 continued practice with management of leg catheter bad including unscrewing cap and emptying into urinal positioning on gait belt. Pt with more success with bottom of bag unattached to leg.  Also extensive discussion and education on SCI and IADLs including target shooting and adaptive utensils in the kitchen and how to modified work space for her success, and possibility of a tenodesis forearm reacher.  Provided pt with websites for references for herself and family.    Therapy Documentation Precautions:  Precautions Precautions: Fall Precaution Comments: quadrapegic Restrictions Weight Bearing Restrictions: No Other Position/Activity Restrictions: does not bear weight General:   Vital Signs: Therapy Vitals Temp: 99.9 F (37.7 C) Temp Source: Oral Pulse Rate: 93 Resp: 16 BP: 106/70 Patient Position (if appropriate): Sitting Oxygen Therapy SpO2: 98 % O2 Device: Room Air Pain:  no c/o pain   Therapy/Group: Individual Therapy  Willeen Cass [facility] 03/02/2019, 6:54 PM

## 2019-03-03 ENCOUNTER — Inpatient Hospital Stay (HOSPITAL_COMMUNITY): Payer: Medicare Other | Admitting: Occupational Therapy

## 2019-03-03 ENCOUNTER — Inpatient Hospital Stay (HOSPITAL_COMMUNITY): Payer: Medicare Other | Admitting: Physical Therapy

## 2019-03-03 LAB — CBC WITH DIFFERENTIAL/PLATELET
Abs Immature Granulocytes: 0.05 10*3/uL (ref 0.00–0.07)
Basophils Absolute: 0 10*3/uL (ref 0.0–0.1)
Basophils Relative: 0 %
Eosinophils Absolute: 0.3 10*3/uL (ref 0.0–0.5)
Eosinophils Relative: 3 %
HCT: 31 % — ABNORMAL LOW (ref 36.0–46.0)
Hemoglobin: 9.3 g/dL — ABNORMAL LOW (ref 12.0–15.0)
Immature Granulocytes: 0 %
Lymphocytes Relative: 26 %
Lymphs Abs: 3.2 10*3/uL (ref 0.7–4.0)
MCH: 22 pg — ABNORMAL LOW (ref 26.0–34.0)
MCHC: 30 g/dL (ref 30.0–36.0)
MCV: 73.5 fL — ABNORMAL LOW (ref 80.0–100.0)
Monocytes Absolute: 0.6 10*3/uL (ref 0.1–1.0)
Monocytes Relative: 5 %
Neutro Abs: 7.9 10*3/uL — ABNORMAL HIGH (ref 1.7–7.7)
Neutrophils Relative %: 66 %
Platelets: 636 10*3/uL — ABNORMAL HIGH (ref 150–400)
RBC: 4.22 MIL/uL (ref 3.87–5.11)
RDW: 26.5 % — ABNORMAL HIGH (ref 11.5–15.5)
WBC: 12.1 10*3/uL — ABNORMAL HIGH (ref 4.0–10.5)
nRBC: 0 % (ref 0.0–0.2)

## 2019-03-03 LAB — URINALYSIS, ROUTINE W REFLEX MICROSCOPIC
Bilirubin Urine: NEGATIVE
Glucose, UA: NEGATIVE mg/dL
Hgb urine dipstick: NEGATIVE
Ketones, ur: NEGATIVE mg/dL
Leukocytes,Ua: NEGATIVE
Nitrite: NEGATIVE
Protein, ur: NEGATIVE mg/dL
Specific Gravity, Urine: 1.005 (ref 1.005–1.030)
pH: 6 (ref 5.0–8.0)

## 2019-03-03 MED ORDER — TIZANIDINE HCL 2 MG PO TABS
2.0000 mg | ORAL_TABLET | Freq: Two times a day (BID) | ORAL | Status: DC
  Administered 2019-03-03 – 2019-03-07 (×9): 2 mg via ORAL
  Filled 2019-03-03 (×9): qty 1

## 2019-03-03 NOTE — Progress Notes (Signed)
[place] PHYSICAL MEDICINE & REHABILITATION PROGRESS NOTE   Subjective/Complaints: Patient reports spasticity the same, but feels somewhat weak compared to normal, even with PT today/this AM. Had temp of 100 degrees (99.9) last night- CBC shows WBC slightly elevated at 12K, however no left shift in neutrophils- U/A is completely stone cold normal and feels ok otherwise- denies reps Sx's or GI Sx's- Did have BM yesterday- an accident and also had one during PT this AM.    ROS: Denies CP, shortness of breath, nausea, vomiting, diarrhea.  Objective:   No results found. Recent Labs    03/03/19 0557  WBC 12.1*  HGB 9.3*  HCT 31.0*  PLT 636*   No results for input(s): NA, K, CL, CO2, GLUCOSE, BUN, CREATININE, CALCIUM in the last 72 hours.  Intake/Output Summary (Last 24 hours) at 03/03/2019 1400 Last data filed at 03/03/2019 0900 Gross per 24 hour  Intake 440 ml  Output 1550 ml  Net -1110 ml     Physical Exam: Vital Signs Blood pressure 110/76, pulse 89, temperature 98.2 F (36.8 C), temperature source Oral, resp. rate 16, height 5\' 6"  (1.676 m), weight 99.3 kg, SpO2 97 %. Physical Exam Constitutional: No distress . Vital signs reviewed.  Morbidly obese. Lying supine in bed, legs curled up under her in bed;  Doesn't like ot wake up early, so sleepy; NAD HENT: Normocephalic.  Atraumatic. Eyes: EOMI. No discharge. Cardiovascular: RRR Respiratory: CTA b/l no W/R/R GI: . Protuberant, ND, soft, NT, (+)Soft Skin: Warm and dry.  Intact. Psych: Normal mood.  Normal behavior. Musc: Lower extremity contractures and legs curle at ankles- in PF contractures; also intermittent spasms seen of LEs; hands/fingers curled at rest Neurological: Alert and oriented C6 motor level.  Bilateral lower extremities: 0/5 proximal distal Sensation absent to light touch bilateral lower extremities Skin: Skin iswarm. She isnot diaphoretic. Noerythema.  Psychiatric: brighter affect GU- Has a  foley currently    Assessment/Plan: 1. Functional deficits secondary to debility from sepsis due to UTI with associated C6 quadriplegia ASIA A which require 3+ hours per day of interdisciplinary therapy in a comprehensive inpatient rehab setting.  Physiatrist is providing close team supervision and 24 hour management of active medical problems listed below.  Physiatrist and rehab team continue to assess barriers to discharge/monitor patient progress toward functional and medical goals  Care Tool:  Bathing    Body parts bathed by patient: Right arm, Left arm, Chest, Abdomen, Right upper leg, Left upper leg, Right lower leg, Left lower leg, Face   Body parts bathed by helper: Front perineal area, Buttocks     Bathing assist Assist Level: Minimal Assistance - Patient > 75%     Upper Body Dressing/Undressing Upper body dressing   What is the patient wearing?: Bra, Pull over shirt    Upper body assist Assist Level: Moderate Assistance - Patient 50 - 74%    Lower Body Dressing/Undressing Lower body dressing      What is the patient wearing?: Underwear/pull up, Pants     Lower body assist Assist for lower body dressing: Maximal Assistance - Patient 25 - 49%     Toileting Toileting Toileting Activity did not occur (Clothing management and hygiene only): N/A (no void or bm)  Toileting assist Assist for toileting: 2 Helpers(Per LPN report )     Transfers Chair/bed transfer  Transfers assist     Chair/bed transfer assist level: Moderate Assistance - Patient 50 - 74%     Locomotion Ambulation  Ambulation assist   Ambulation activity did not occur: N/A          Walk 10 feet activity   Assist  Walk 10 feet activity did not occur: N/A        Walk 50 feet activity   Assist Walk 50 feet with 2 turns activity did not occur: N/A         Walk 150 feet activity   Assist Walk 150 feet activity did not occur: N/A         Walk 10 feet on uneven  surface  activity   Assist Walk 10 feet on uneven surfaces activity did not occur: N/A         Wheelchair     Assist Will patient use wheelchair at discharge?: Yes Type of Wheelchair: Power    Wheelchair assist level: Independent Max wheelchair distance: 150'    Wheelchair 50 feet with 2 turns activity    Assist        Assist Level: Independent   Wheelchair 150 feet activity     Assist      Assist Level: Independent   Blood pressure 110/76, pulse 89, temperature 98.2 F (36.8 C), temperature source Oral, resp. rate 16, height 5\' 6"  (1.676 m), weight 99.3 kg, SpO2 97 %.  Medical Problem List and Plan: 1.Functional deficitssecondary to debility in the setting of chronic C6 SCI (1999). Pt is power w/c dependent Continue CIR -need to acquire w/c from home  9/8- spouse brought w/c in  9/10- will order Abd binder for pt to sit up/better posture 2. Antithrombotics: -DVT/anticoagulation:Pharmaceutical:Lovenox -antiplatelet therapy: N/a 3.Chronic pain/Pain Management:Now on cymbalta daily with oxycodone prn. 4. Mood:LCSW to follow for evaluation and support. -antipsychotic agents: N/A 5. Neuropsych: This patientiscapable of making decisions on herown behalf. 6. Skin/Wound Care:Pressure relief measures--maintain adequate nutritonal status. Air mattress to prevent breakdown. Unna boots to BLE if bullous lesions recurr 7. Fluids/Electrolytes/Nutrition:Monitor I/O.  8. E coli sepsis: Continue Levaquin -- ? Day 11/14. 9. Bilateral hydronephrosis/frequent sepsis due to UTI/neurogenic bladder: Continue foley for now.Needs education on B/B program.  Continue Foley at present, will follow-up regarding long-term plans  9/8- Urology said have pt f/u for urodynamics, but for now, keep foley.  9/9- discussed plan of bladder, and explained to pt- working on leg bag and learning to empty it. 10.  Neurogenic bowel:Scared to have diarrhea with laxatives and has been refusing miralax--has BM "once a month".  Patient never adopted bowel program  Continue Senokot-s 2 tabs              -miralax qam  Attempting to change bowel program to nightly   9/8- will change to 2x/week per pt request; add lidocaine and change suppository to enemeeze 11. Anemia of chronic disease: Increase iron to daily.   Hemoglobin 8.7 on 9/7 12. Hypokalemia: Continue supplement  Potassium 3.8 on 9/7   Magnesium 1.9 on 9/3  13. Low protein stores: Added Prostat. 14. Hx of depression/anxiety:Now on cymbalta with vistaril prn for anxiety. 15. Spasticity/spasms-   Baclofen increased to 10 mg 3 times daily on 9/7  9/9- will increase Baclofen tomorrow again to 15 mg TID- might start seeing changes soon.  9/10- will increase Baclofen to 15 mg TID and see how it goes- might need Zanaflex as well.   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  Morbid obesity: Encouraged weight loss  9/9- will try weight loss meds outpt.  19. Leukocytosis  9/11- will recheck CBC with diff  in AM, earlier if symptomatic U/A is completely Negative; Lung exam clear; constipation? AD? Has BP in 150s earlier, but based on d/w team, don't want to start empiric ABX when I don't know what we are treating and will add to her ABX resistance- will recheck in AM and monitor Sx's closely and treat as required- trying to balance treating her and not treating an unknown entity. Of note, no more temps this AM/afternoon as of this note.  19. Dispo  9/8- will allow pt daily outside passes as long as doesn't miss therapy and lets staff know when leaving floor.   LOS: 8 days A FACE TO FACE EVALUATION WAS PERFORMED  Jaydee Conran 03/03/2019, 2:00 PM

## 2019-03-03 NOTE — Progress Notes (Signed)
Recreational Therapy Session Note  Patient Details  Name: Tracey Hall MRN: [id number] Date of Birth: Jun 16, 1981 Today's Date: 03/03/2019 Time:  930-945 Pain: no c/o Skilled Therapeutic Interventions/Progress Updates: Session focused on providing and discussing adaptive recreation opportunities locally and within the state.  Handout provided with agency names and contact information for pt to investigate at a later date.  Pt appreciative of information and excited about potential leisure opportunities.  Therapy/Group: Individual Therapy Lassie Demorest 03/03/2019, 12:27 PM

## 2019-03-03 NOTE — Progress Notes (Signed)
Occupational Therapy Note  Patient Details  Name: Tracey Hall MRN: [id number] Date of Birth: 02/22/1981  Today's Date: 03/03/2019 OT Missed Time: 38 Minutes Missed Time Reason: Patient ill (comment)  Pt decline activity today due to fever and not feeling well.  Remained in bed. Missed 45 min of skilled care.    Willeen Cass [facility] 03/03/2019, 3:42 PM

## 2019-03-03 NOTE — Progress Notes (Signed)
Physical Therapy Weekly Progress Note  Patient Details  Name: Tracey Hall MRN: [id number] Date of Birth: 1980-09-29  Beginning of progress report period: February 24, 2019 End of progress report period: March 03, 2019  Today's Date: 03/03/2019 PT Individual Time: 0800-0900 PT Individual Time Calculation (min): 60 min   Patient has met 3 of 3 short term goals.  Pt is able to complete bed mobility with mod A overall, is able to maintain sitting balance EOM with close SBA, is overall mod A for slide board transfers, and is independent once up in her power w/c. Pt remains motivated and open to new ideas for transfer techniques and mobility to decrease burden of care on her family. Pt is also receptive to SCI education with regards to pressure relief, bowel and bladder management, positioning, and use of adaptive equipment.  Patient continues to demonstrate the following deficits muscle weakness, muscle joint tightness and muscle paralysis, abnormal tone and unbalanced muscle activation and decreased sitting balance, decreased postural control and decreased balance strategies and therefore will continue to benefit from skilled PT intervention to increase functional independence with mobility.  Patient progressing toward long term goals..  Continue plan of care.  PT Short Term Goals Week 1:  PT Short Term Goal 1 (Week 1): Pt will maintain sitting balance EOB with min A consistently PT Short Term Goal 1 - Progress (Week 1): Met PT Short Term Goal 2 (Week 1): Pt will initiate slide board transfer PT Short Term Goal 2 - Progress (Week 1): Met PT Short Term Goal 3 (Week 1): Pt will be able to recall 3 pressure relief techniques PT Short Term Goal 3 - Progress (Week 1): Met Week 2:  PT Short Term Goal 1 (Week 2): =LTG due to ELOS  Skilled Therapeutic Interventions/Progress Updates:    Pt received supine in bed asleep, more difficult to arouse this AM due to fatigue. Pt reports feeling very  "weak" and "tired" this AM and that she had a fever overnight. MD in room during session to assess pt. Rolling L/R with mod A this AM with use of bedrail. Pt is dependent to don pants in supine. Supine to sit with HOB elevated with min A for anterior lean. Pt is total A to don bra and max A to don her shirt. Pt has incontinence of bowel while getting dressed, returned to supine. Rolling L/R with mod A and use of bedrails for dependent pericare and brief change, see Flowsheet for bowel details. Pt left seated in bed setup for breakfast, needs in reach at end of session.  Therapy Documentation Precautions:  Precautions Precautions: Fall Precaution Comments: quadrapegic Restrictions Weight Bearing Restrictions: No Other Position/Activity Restrictions: does not bear weight   Therapy/Group: Individual Therapy   Excell Seltzer, PT, DPT  03/03/2019, 12:44 PM

## 2019-03-03 NOTE — Progress Notes (Signed)
Occupational Therapy Weekly Progress Note  Patient Details  Name: Tracey Hall MRN: [id number] Date of Birth: September 24, 1980  Beginning of progress report period: February 24, 2019 End of progress report period: March 03, 2019  Today's Date: 03/03/2019 OT Individual Time: 1400-1500 OT Individual Time Calculation (min): 60 min    Patient has met 3 of 3 short term goals.  Pt is making steady progress towards OT goals. She is very open and receptive to ongoing SCI education. Bladder/bowel program education on-going with pt trialing catheter leg bag which she really likes due to increased independence and freedom. On going education regarding bowel program working with MD as pt going into AD during bowel program and therefore does not like it.  Provided pt with B leg loops which she is able to use to assist with bed mobility and during sliding board transfers.  Preparing for family ed prior to d/c home with husband and adult children in order to educate regarding safe transfer techniques, modified ADLs resulting in increased independence with ADLs and on-going SCI education.   Patient continues to demonstrate the following deficits:muscle weakness and education about SCI management , decreased cardiorespiratoy endurance and decreased sitting balance and difficulty maintaining precautions  and therefore will continue to benefit from skilled OT intervention to enhance overall performance with BADL and Reduce care partner burden.  Patient progressing toward long term goals..  Continue plan of care.  OT Short Term Goals Week 1:  OT Short Term Goal 1 (Week 1): Pt will use bed rails with mod A for rolling for ADL tasks OT Short Term Goal 1 - Progress (Week 1): Met OT Short Term Goal 2 (Week 1): Education on leg loops for management of LEs in power chair/ bed OT Short Term Goal 2 - Progress (Week 1): Met OT Short Term Goal 3 (Week 1): Slide board transfer in ADL bed with mod A +2 for safety to  secure bed OT Short Term Goal 3 - Progress (Week 1): Met Week 2:  OT Short Term Goal 1 (Week 2): STG=LTG due to LOS  Skilled Therapeutic Interventions/Progress Updates:    Pt seen for OT session focusing on functional transfers/mobility and UE strengthening/ROM. Pt in supine upon arrival, complaints of cont generalized weakness/fatigue which started yesterday, however, willing to participate as able.  Leg loops donned total A bed level.  She transferred to sitting EOB using leg loops to assist in bringing LEs off EOB and use of bed rails with overall steadying assist and VCs for positioning.  +2 sliding board transfer to power w/c. In therapy gym, pt's hands ACE wrapped to #2 dowel rod. Pt completed the following exercises, x3 sets of 5 with multi-modal cuing for proper form and technique and rest breaks btwn trials. Chair placed in tilted back feature to facilitate sitting balance. Overhead press Chest Press Bicep curl Pt returned to room at end of session with plans to sign out and go outside, RN aware.    Therapy Documentation Precautions:  Precautions Precautions: Fall Precaution Comments: quadrapegic Restrictions Weight Bearing Restrictions: No Other Position/Activity Restrictions: does not bear weight   Therapy/Group: Individual Therapy  Tracey Hall L 03/03/2019, 7:05 AM

## 2019-03-03 NOTE — Progress Notes (Signed)
Occupational Therapy Note  Patient Details  Name: Tracey Hall MRN: [id number] Date of Birth: 18-Jan-1981  Today's Date: 03/03/2019 OT Missed Time: 92 Minutes Missed Time Reason: Patient ill (comment)  Pt missed 45 mins scheduled OT treatment session as she reports feeling "awful" and stating "I feel weak". Encouraged pt to engage in any activity even at bed level, however pt refused any activity at this time.  Pt did state she would attempt next therapy session.   Simonne Come 03/03/2019, 12:10 PM

## 2019-03-03 NOTE — Progress Notes (Signed)
Social Work Patient ID: Tracey Hall, female   DOB: [birth year], 38 y.o.   MRN: [id number]   CSW met with pt 03-02-19 to update her on team conference discussion and targeted d/c date of 03-10-19.  Pt is accepting of this and is still glad she is on CIR, but is questioning if she is learning anything or making any progress.  She also thought we were going to d/c her because she wasn't.  CSW explained that our goals are to make her and her family safer in her transfers, to teach her new ways to do things, and to get her bladder/bowel needs met.  She expressed understanding of this and is still committed to working with team.  CSW will continue to follow and assist as needed.

## 2019-03-03 NOTE — Patient Care Conference (Signed)
Inpatient RehabilitationTeam Conference and Plan of Care Update Date: 03/01/2019   Time: 9:40 AM    Patient Name: Tracey Hall      Medical Record Number: [id number]  Date of Birth: 09-11-1980 Sex: Female         Room/Bed: 4M12C/4M12C-01 Payor Info: Payor: MEDICARE / Plan: MEDICARE PART A AND B / Product Type: *No Product type* /    Admitting Diagnosis: 3. SCI Team  Debility, Sepsis E coli.; 14-16days  Admit Date/Time:  02/23/2019 10:21 AM Admission Comments: No comment available   Primary Diagnosis:  Debility Principal Problem: Debility  Patient Active Problem List   Diagnosis Date Noted  . 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 Anemia of chronic disease   . Muscle spasm of both lower legs   . Neurogenic bowel 02/26/2019  . Paraplegia ([place]) 02/24/2019  . Debility 02/23/2019  . Sepsis ([place]) 02/18/2019  . Chronic pain syndrome 10/14/2018  . Complication of feeding tube ([place])   . Intubation of airway performed without difficulty   . Tachypnea   . Peptic ulcer disease with hemorrhage 08/03/2017  . E coli bacteremia 08/03/2017  . Sepsis due to urinary tract infection ([place]) 07/31/2017  . Bullous dermatitis 10/13/2016  . AKI (acute kidney injury) ([place]) 09/16/2016  . Hypokalemia 09/16/2016  . Obesity 02/19/2012  . Recurrent UTI 09/15/2010  . Asthma 09/15/2010  . Fever 08/21/2010  . Anxiety state 01/10/2010  . Neurogenic bladder 01/04/2007  . Anemia 12/06/2006  . Major depressive disorder, recurrent episode ([place]) 08/19/2006  . 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 Alysia Penna Social Worker Present: Alfonse Alpers, LCSW Nurse Present: Rayetta Humphrey, RN PT Present: Excell Seltzer, PT OT Present: Amy Rounds, OT SLP Present: Charolett Bumpers, SLP PPS Coordinator present : Gunnar Fusi, SLP     Current Status/Progress Goal Weekly Team Focus   Medical   Pt has foley- discussing foley vs suprapubic- which is better for intimacy- using leg bag; spasticity still major issue- titrating baclofen  to improve spasticity  improve spasticity   Bowel/Bladder   Pt LBM on 9/8, and on Mon/Fri Bowel program, pt is incontinent and has foley in place and to not be removed for the time being.  Goal for pt to be compliant with B/B program  Address toileting per shift & prn, reinforce and educate on importance of compliance    Swallow/Nutrition/ Hydration             ADL's   Mod A+2 basic sliding board transfers, Max A LB dressing bed level, min A UB dressing, min A UB/LB bathing, total A toileting bed level  Max A LB dressing, demonstrate safe transfer technique with spouse, SCI education  ADL re-training, SCI education, family training and d/c planing   Mobility   min A rolling, max A supine to/from sit, mod to max A SB transfers, mod I with power w/c  min A overall  sitting balance, bed mobility, transfers   Communication             Safety/Cognition/ Behavioral Observations            Pain   Pt complaint of pain more from spasms, has scheduled med  Maintain pain level less than 4  Assess pain q shift and prn   Skin   Pt skin has ecchymosis on abdomen and ankle, healed scar tissue on abdomen and neck  Prevent further/new breakdown of skin  Assess skin q shift and prn    Rehab Goals Patient on target to meet rehab goals: Yes Rehab Goals Revised: none *See Care Plan and progress notes for long and short-term goals.     Barriers to Discharge  Current Status/Progress Possible Resolutions Date Resolved   Physician    Other (comments);Weight;Neurogenic Bowel & Bladder  spasticity, C6 quadriplegia,  mod to max assist for most ADLs/mobility- working on improving, esp leg bag management  improve spasticity      Nursing                  PT                    OT                  SLP                SW                Discharge  Planning/Teaching Needs:  Pt plans to return home to her family.  They can assist her as needed.  Boyfriend can come in for family education closer to d/c, if needed.   Team Discussion:  Pt with foley and leg bag.  MD and nursing are working on bowel program with enema Monday - Friday, as pt was only having one bowel movement a month PTA.  Pt does not c/o pain.  OT is educating pt on SCI.  Pt is min A ADLs at bed level.  She's mod to max A with slide board.  Pt is mod A with bed mobility.  Main goals are to get pt's transfers safer and to teach pt better ways to do things.  Revisions to Treatment Plan:  none    Continued Need for Acute Rehabilitation Level of Care: The patient requires daily medical management by a physician with specialized training in physical medicine and rehabilitation for the following conditions: Daily direction of a multidisciplinary physical rehabilitation program to ensure safe treatment while eliciting the highest outcome that is of practical value to the patient.: Yes Daily medical management of patient stability for increased activity during participation in an intensive rehabilitation regime.: Yes Daily analysis of laboratory values and/or radiology reports with any subsequent need for medication adjustment of medical intervention for : Neurological problems;Urological problems;Mood/behavior problems;Other;Blood pressure problems   I attest that I was present, lead the team conference, and concur with the assessment and plan of the team.Team conference was held via web/ teleconference due to [place] - 19.   Harlow Carrizales, Silvestre Mesi 03/03/2019, 11:09 AM

## 2019-03-04 ENCOUNTER — Inpatient Hospital Stay (HOSPITAL_COMMUNITY): Payer: Medicare Other | Admitting: Occupational Therapy

## 2019-03-04 LAB — COMPREHENSIVE METABOLIC PANEL
ALT: 17 U/L (ref 0–44)
AST: 19 U/L (ref 15–41)
Albumin: 3 g/dL — ABNORMAL LOW (ref 3.5–5.0)
Alkaline Phosphatase: 56 U/L (ref 38–126)
Anion gap: 8 (ref 5–15)
BUN: 13 mg/dL (ref 6–20)
CO2: 23 mmol/L (ref 22–32)
Calcium: 9.2 mg/dL (ref 8.9–10.3)
Chloride: 107 mmol/L (ref 98–111)
Creatinine, Ser: 0.61 mg/dL (ref 0.44–1.00)
GFR calc Af Amer: >60 mL/min (ref >60)
GFR calc non Af Amer: >60 mL/min (ref >60)
Glucose, Bld: 106 mg/dL — ABNORMAL HIGH (ref 70–99)
Potassium: 4 mmol/L (ref 3.5–5.1)
Sodium: 138 mmol/L (ref 135–145)
Total Bilirubin: 0.1 mg/dL — ABNORMAL LOW (ref 0.3–1.2)
Total Protein: 6.5 g/dL (ref 6.5–8.1)

## 2019-03-04 LAB — URINE CULTURE: Culture: <10000 — AB

## 2019-03-04 LAB — CBC WITH DIFFERENTIAL/PLATELET
Abs Immature Granulocytes: 0.04 10*3/uL (ref 0.00–0.07)
Basophils Absolute: 0 10*3/uL (ref 0.0–0.1)
Basophils Relative: 0 %
Eosinophils Absolute: 0.3 10*3/uL (ref 0.0–0.5)
Eosinophils Relative: 4 %
HCT: 30.5 % — ABNORMAL LOW (ref 36.0–46.0)
Hemoglobin: 9 g/dL — ABNORMAL LOW (ref 12.0–15.0)
Immature Granulocytes: 1 %
Lymphocytes Relative: 26 %
Lymphs Abs: 2.3 10*3/uL (ref 0.7–4.0)
MCH: 22.1 pg — ABNORMAL LOW (ref 26.0–34.0)
MCHC: 29.5 g/dL — ABNORMAL LOW (ref 30.0–36.0)
MCV: 74.8 fL — ABNORMAL LOW (ref 80.0–100.0)
Monocytes Absolute: 0.7 10*3/uL (ref 0.1–1.0)
Monocytes Relative: 8 %
Neutro Abs: 5.5 10*3/uL (ref 1.7–7.7)
Neutrophils Relative %: 61 %
Platelets: 607 10*3/uL — ABNORMAL HIGH (ref 150–400)
RBC: 4.08 MIL/uL (ref 3.87–5.11)
RDW: 26.7 % — ABNORMAL HIGH (ref 11.5–15.5)
WBC: 8.8 10*3/uL (ref 4.0–10.5)
nRBC: 0 % (ref 0.0–0.2)

## 2019-03-04 NOTE — Progress Notes (Signed)
Occupational Therapy Session Note  Patient Details  Name: Tracey Hall MRN: [id number] Date of Birth: 11/15/1980  Today's Date: 03/04/2019 OT Individual Time: [id number] OT Individual Time Calculation (min): 60 min    Short Term Goals: Week 2:  OT Short Term Goal 1 (Week 2): STG=LTG due to LOS  Skilled Therapeutic Interventions/Progress Updates:    Treatment session with focus on self-care retraining and functional mobility.  Pt asleep in supine upon arrival, requiring increased time to fully arouse.  Engaged in dressing at bed level with focus on LB positioning to increase participation in LB dressing.  Use of hospital bed functions to sit upright and maintained upright sitting with CGA.  Required assistance for positioning of BLE during bed mobility/rolling when adjusting pants over hips.  Pt able to position BLE to EOB with increased time and CGA for sitting balance.  Utilized [employer] with mod assist for transfer and +2 for safety with pt directing sequencing of transfer.  Gait belt used for positioning of BLEs.  Pt completed grooming tasks at sink with occasional setup for items. Pt completed w/c mobility in hallways with min cues for awareness of obstacles and people entering from various doorways.  Setup to engage in Wii sports activities for leisure pursuits.  Therapy Documentation Precautions:  Precautions Precautions: Fall Precaution Comments: quadrapegic Restrictions Weight Bearing Restrictions: No Other Position/Activity Restrictions: does not bear weight Pain:   Pt with no c/o pain  Therapy/Group: Individual Therapy  Simonne Come 03/04/2019, 12:39 PM

## 2019-03-04 NOTE — Progress Notes (Signed)
[facility] PHYSICAL MEDICINE & REHABILITATION PROGRESS NOTE   Subjective/Complaints:  WBC normalized no fevers,  pt c/o being weak from baclofen no fatigue    ROS: Denies CP, shortness of breath, nausea, vomiting, diarrhea.  Objective:   No results found. Recent Labs    03/03/19 0557 03/04/19 0538  WBC 12.1* 8.8  HGB 9.3* 9.0*  HCT 31.0* 30.5*  PLT 636* 607*   Recent Labs    03/04/19 0538  NA 138  K 4.0  CL 107  CO2 23  GLUCOSE 106*  BUN 13  CREATININE 0.61  CALCIUM 9.2    Intake/Output Summary (Last 24 hours) at 03/04/2019 0921 Last data filed at 03/04/2019 0550 Gross per 24 hour  Intake 120 ml  Output 500 ml  Net -380 ml     Physical Exam: Vital Signs Blood pressure 121/69, pulse 79, temperature 97.8 F (36.6 C), resp. rate 18, height 5\' 6"  (1.676 m), weight 99.3 kg, SpO2 99 %. Physical Exam Constitutional: No distress . Vital signs reviewed.  Morbidly obese. Lying supine in bed, legs curled up under her in bed;  Doesn't like ot wake up early, so sleepy; NAD HENT: Normocephalic.  Atraumatic. Eyes: EOMI. No discharge. Cardiovascular: RRR Respiratory: CTA b/l no W/R/R GI: . Protuberant, ND, soft, NT, (+)Soft Skin: Warm and dry.  Intact. Psych: Normal mood.  Normal behavior. Musc: Lower extremity contractures and legs curle at ankles- in PF contractures; also intermittent spasms seen of LEs; hands/fingers curled at rest Neurological: Alert and oriented C6 motor level. Intrinsic minus hand deformities Bilateral lower extremities: 0/5 proximal distal Sensation absent to light touch bilateral lower extremities Skin: Skin iswarm. She isnot diaphoretic. Noerythema.  Psychiatric: brighter affect GU- Has a foley currently    Assessment/Plan: 1. Functional deficits secondary to debility from sepsis due to UTI with associated C6 quadriplegia ASIA A which require 3+ hours per day of interdisciplinary therapy in a comprehensive inpatient rehab  setting.  Physiatrist is providing close team supervision and 24 hour management of active medical problems listed below.  Physiatrist and rehab team continue to assess barriers to discharge/monitor patient progress toward functional and medical goals  Care Tool:  Bathing    Body parts bathed by patient: Right arm, Left arm, Chest, Abdomen, Right upper leg, Left upper leg, Right lower leg, Left lower leg, Face   Body parts bathed by helper: Front perineal area, Buttocks     Bathing assist Assist Level: Minimal Assistance - Patient > 75%     Upper Body Dressing/Undressing Upper body dressing   What is the patient wearing?: Bra, Pull over shirt    Upper body assist Assist Level: Moderate Assistance - Patient 50 - 74%    Lower Body Dressing/Undressing Lower body dressing      What is the patient wearing?: Underwear/pull up, Pants     Lower body assist Assist for lower body dressing: Maximal Assistance - Patient 25 - 49%     Toileting Toileting Toileting Activity did not occur (Clothing management and hygiene only): N/A (no void or bm)  Toileting assist Assist for toileting: 2 Helpers(Per LPN report )     Transfers Chair/bed transfer  Transfers assist     Chair/bed transfer assist level: Moderate Assistance - Patient 50 - 74%     Locomotion Ambulation   Ambulation assist   Ambulation activity did not occur: N/A          Walk 10 feet activity   Assist  Walk 10 feet activity did  not occur: N/A        Walk 50 feet activity   Assist Walk 50 feet with 2 turns activity did not occur: N/A         Walk 150 feet activity   Assist Walk 150 feet activity did not occur: N/A         Walk 10 feet on uneven surface  activity   Assist Walk 10 feet on uneven surfaces activity did not occur: N/A         Wheelchair     Assist Will patient use wheelchair at discharge?: Yes Type of Wheelchair: Power    Wheelchair assist level:  Independent Max wheelchair distance: 150'    Wheelchair 50 feet with 2 turns activity    Assist        Assist Level: Independent   Wheelchair 150 feet activity     Assist      Assist Level: Independent   Blood pressure 121/69, pulse 79, temperature 97.8 F (36.6 C), resp. rate 18, height 5\' 6"  (1.676 m), weight 99.3 kg, SpO2 99 %.  Medical Problem List and Plan: 1.Functional deficitssecondary to debility in the setting of chronic C6 SCI (1999). Pt is power w/c dependent Continue CIR -need to acquire w/c from home  9/8- spouse brought w/c in  9/10- will order Abd binder for pt to sit up/better posture 2. Antithrombotics: -DVT/anticoagulation:Pharmaceutical:Lovenox -antiplatelet therapy: N/a 3.Chronic pain/Pain Management:Now on cymbalta daily with oxycodone prn. 4. Mood:LCSW to follow for evaluation and support. -antipsychotic agents: N/A 5. Neuropsych: This patientiscapable of making decisions on herown behalf. 6. Skin/Wound Care:Pressure relief measures--maintain adequate nutritonal status. Air mattress to prevent breakdown. Unna boots to BLE if bullous lesions recurr 7. Fluids/Electrolytes/Nutrition:Monitor I/O.  8. E coli sepsis: Continue Levaquin -- ? Day 11/14. 9. Bilateral hydronephrosis/frequent sepsis due to UTI/neurogenic bladder: Continue foley for now.Needs education on B/B program.  Continue Foley at present, will follow-up regarding long-term plans  9/8- Urology said have pt f/u for urodynamics, but for now, keep foley.  9/9- discussed plan of bladder, and explained to pt- working on leg bag and learning to empty it. 10. Neurogenic bowel:Scared to have diarrhea with laxatives and has been refusing miralax--has BM "once a month".  Patient never adopted bowel program  Continue Senokot-s 2 tabs              -miralax qam  Attempting to change bowel program to  nightly   9/8- will change to 2x/week per pt request; add lidocaine and change suppository to enemeeze 11. Anemia of chronic disease: Increase iron to daily.   Hemoglobin 8.7 on 9/7 12. Hypokalemia: Continue supplement  Potassium 3.8 on 9/7   Magnesium 1.9 on 9/3  13. Low protein stores: Added Prostat. 14. Hx of depression/anxiety:Now on cymbalta with vistaril prn for anxiety. 15. Spasticity/spasms-   Baclofen increased to 10 mg 3 times daily on 9/7  9/9- will increase Baclofen tomorrow again to 15 mg TID- might start seeing changes soon.  9/10- will increase Baclofen to 15 mg TID and see how it goes- might need Zanaflex as well.   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  Morbid obesity: Encouraged weight loss  9/9- will try weight  loss meds outpt.  19. Leukocytosis  9/11- will recheck CBC with diff  in AM, earlier if symptomatic U/A is completely Negative; Lung exam clear; constipation? AD? Has BP in 150s earlier, but based on d/w team, don't want to start empiric ABX when I don't know what we are treating and will add to her ABX resistance- will recheck in AM and monitor Sx's closely and treat as required- trying to balance treating her and not treating an unknown entity. Of note, no more temps this AM/afternoon as of this note.  19. Dispo  9/8- will allow pt daily outside passes as long as doesn't miss therapy and lets staff know when leaving floor.   LOS: 9 days A FACE TO FACE EVALUATION WAS PERFORMED  Charlett Blake 03/04/2019, 9:21 AM

## 2019-03-04 NOTE — Progress Notes (Signed)
Pt stated she did not feel good and developed headache/dizziness after administering Enemeez enema. Patient repositioned to sitting position and vital signs checked WNL. She was given klonopin and claimed she felt better. Had a large mushy stool after the enema. Pt refused digital stimulation to check if rectal vault is empty of stool.

## 2019-03-05 NOTE — Plan of Care (Signed)
  Problem: Consults Goal: RH SPINAL CORD INJURY PATIENT EDUCATION Description:  See Patient Education module for education specifics.  Outcome: Progressing   Problem: SCI BOWEL ELIMINATION Goal: RH STG MANAGE BOWEL WITH ASSISTANCE Description: STG Manage Bowel with total Assistance. Outcome: Progressing Goal: RH STG SCI MANAGE BOWEL WITH MEDICATION WITH ASSISTANCE Description: STG SCI Manage bowel with medication with min assistance. Outcome: Progressing   Problem: SCI BLADDER ELIMINATION Goal: RH STG MANAGE BLADDER WITH EQUIPMENT WITH ASSISTANCE Description: STG Manage Bladder With Equipment With total Assistance Outcome: Progressing   Problem: RH SKIN INTEGRITY Goal: RH STG SKIN FREE OF INFECTION/BREAKDOWN Description: With min assist Outcome: Progressing   Problem: RH SAFETY Goal: RH STG ADHERE TO SAFETY PRECAUTIONS W/ASSISTANCE/DEVICE Description: STG Adhere to Safety Precautions With cues/reminders Assistance/Device. Outcome: Progressing   Problem: RH PAIN MANAGEMENT Goal: RH STG PAIN MANAGED AT OR BELOW PT'S PAIN GOAL Description: At or below level 5 Outcome: Progressing   Problem: RH KNOWLEDGE DEFICIT SCI Goal: RH STG INCREASE KNOWLEDGE OF SELF CARE AFTER SCI Description: Pt will be able to direct care at discharge using handouts and educational resources with cues/reminders Outcome: Progressing

## 2019-03-06 ENCOUNTER — Inpatient Hospital Stay (HOSPITAL_COMMUNITY): Payer: Medicare Other | Admitting: Occupational Therapy

## 2019-03-06 ENCOUNTER — Inpatient Hospital Stay (HOSPITAL_COMMUNITY): Payer: Medicare Other

## 2019-03-06 LAB — URINALYSIS, ROUTINE W REFLEX MICROSCOPIC
Bilirubin Urine: NEGATIVE
Glucose, UA: NEGATIVE mg/dL
Hgb urine dipstick: NEGATIVE
Ketones, ur: NEGATIVE mg/dL
Leukocytes,Ua: NEGATIVE
Nitrite: NEGATIVE
Protein, ur: NEGATIVE mg/dL
Specific Gravity, Urine: 1.002 — ABNORMAL LOW (ref 1.005–1.030)
pH: 6 (ref 5.0–8.0)

## 2019-03-06 LAB — COMPREHENSIVE METABOLIC PANEL
ALT: 20 U/L (ref 0–44)
AST: 23 U/L (ref 15–41)
Albumin: 3.1 g/dL — ABNORMAL LOW (ref 3.5–5.0)
Alkaline Phosphatase: 59 U/L (ref 38–126)
Anion gap: 8 (ref 5–15)
BUN: 12 mg/dL (ref 6–20)
CO2: 24 mmol/L (ref 22–32)
Calcium: 9.2 mg/dL (ref 8.9–10.3)
Chloride: 107 mmol/L (ref 98–111)
Creatinine, Ser: 0.57 mg/dL (ref 0.44–1.00)
GFR calc Af Amer: >60 mL/min (ref >60)
GFR calc non Af Amer: >60 mL/min (ref >60)
Glucose, Bld: 143 mg/dL — ABNORMAL HIGH (ref 70–99)
Potassium: 3.9 mmol/L (ref 3.5–5.1)
Sodium: 139 mmol/L (ref 135–145)
Total Bilirubin: 0.3 mg/dL (ref 0.3–1.2)
Total Protein: 6.8 g/dL (ref 6.5–8.1)

## 2019-03-06 LAB — CBC WITH DIFFERENTIAL/PLATELET
Abs Immature Granulocytes: 0 10*3/uL (ref 0.00–0.07)
Basophils Absolute: 0 10*3/uL (ref 0.0–0.1)
Basophils Relative: 0 %
Eosinophils Absolute: 0.7 10*3/uL — ABNORMAL HIGH (ref 0.0–0.5)
Eosinophils Relative: 9 %
HCT: 32.2 % — ABNORMAL LOW (ref 36.0–46.0)
Hemoglobin: 9.6 g/dL — ABNORMAL LOW (ref 12.0–15.0)
Lymphocytes Relative: 23 %
Lymphs Abs: 1.7 10*3/uL (ref 0.7–4.0)
MCH: 22.6 pg — ABNORMAL LOW (ref 26.0–34.0)
MCHC: 29.8 g/dL — ABNORMAL LOW (ref 30.0–36.0)
MCV: 75.9 fL — ABNORMAL LOW (ref 80.0–100.0)
Monocytes Absolute: 0.1 10*3/uL (ref 0.1–1.0)
Monocytes Relative: 1 %
Neutro Abs: 5 10*3/uL (ref 1.7–7.7)
Neutrophils Relative %: 67 %
Platelets: 629 10*3/uL — ABNORMAL HIGH (ref 150–400)
RBC: 4.24 MIL/uL (ref 3.87–5.11)
RDW: 27.8 % — ABNORMAL HIGH (ref 11.5–15.5)
WBC: 7.5 10*3/uL (ref 4.0–10.5)
nRBC: 0 % (ref 0.0–0.2)
nRBC: 0 /100 WBC

## 2019-03-06 MED ORDER — BACLOFEN 10 MG PO TABS
10.0000 mg | ORAL_TABLET | Freq: Three times a day (TID) | ORAL | Status: DC
  Administered 2019-03-06 – 2019-03-07 (×3): 10 mg via ORAL
  Filled 2019-03-06 (×3): qty 1

## 2019-03-06 NOTE — Progress Notes (Signed)
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 Also, still feeling weak, thinks from baclofen.  Same weakness felt on TH/Friday    ROS: Denies CP, shortness of breath, nausea, vomiting, diarrhea.  Objective:   No results found. Recent Labs    03/04/19 0538  WBC 8.8  HGB 9.0*  HCT 30.5*  PLT 607*   Recent Labs    03/04/19 0538  NA 138  K 4.0  CL 107  CO2 23  GLUCOSE 106*  BUN 13  CREATININE 0.61  CALCIUM 9.2    Intake/Output Summary (Last 24 hours) at 03/06/2019 1026 Last data filed at 03/06/2019 0442 Gross per 24 hour  Intake 220 ml  Output 1650 ml  Net -1430 ml     Physical Exam: Vital Signs Blood pressure 127/77, pulse 79, temperature 97.8 F (36.6 C), temperature source Oral, resp. rate 18, height 5\' 6"  (1.676 m), weight 99.3 kg, SpO2 100 %. Physical Exam Constitutional: No distress . Vital signs reviewed.  Morbidly obese. Seen in gym; in power w/c; working with PT/PTA, NAD HENT: Normocephalic.  Atraumatic. Eyes: EOMI. No discharge. Cardiovascular: RRR Respiratory: CTA b/l no W/R/R GI: . Protuberant, ND, soft, NT, (+)Soft Skin: Warm and dry.  Intact. Except has a bullae on L upper arm size of silver dollar- not popped Psych: Normal mood.  Normal behavior. Musc: Lower extremity contractures and legs curle at ankles- in PF contractures; also intermittent spasms seen of LEs; hands/fingers curled at rest Neurological: Alert and oriented C6 motor level. Intrinsic minus hand deformities Bilateral lower extremities: 0/5 proximal distal Sensation absent to light touch bilateral lower extremities Skin: Skin iswarm. She isnot diaphoretic. Noerythema.  Psychiatric: brighter affect GU- Has a foley currently    Assessment/Plan: 1. Functional deficits secondary to debility from sepsis due to UTI with  associated C6 quadriplegia ASIA A which require 3+ hours per day of interdisciplinary therapy in a comprehensive inpatient rehab setting.  Physiatrist is providing close team supervision and 24 hour management of active medical problems listed below.  Physiatrist and rehab team continue to assess barriers to discharge/monitor patient progress toward functional and medical goals  Care Tool:  Bathing    Body parts bathed by patient: Right arm, Left arm, Chest, Abdomen, Right upper leg, Left upper leg, Right lower leg, Left lower leg, Face   Body parts bathed by helper: Front perineal area, Buttocks     Bathing assist Assist Level: Minimal Assistance - Patient > 75%     Upper Body Dressing/Undressing Upper body dressing   What is the patient wearing?: Bra, Pull over shirt    Upper body assist Assist Level: Moderate Assistance - Patient 50 - 74%    Lower Body Dressing/Undressing Lower body dressing      What is the patient wearing?: Pants     Lower body assist Assist for lower body dressing: Maximal Assistance - Patient 25 - 49%     Toileting Toileting Toileting Activity did not occur (Clothing management and hygiene only): N/A (no void or bm)  Toileting assist Assist for toileting: 2 Helpers(Per LPN report )     Transfers Chair/bed transfer  Transfers assist     Chair/bed transfer assist level: Moderate Assistance - Patient 50 - 74%     Locomotion Ambulation   Ambulation assist   Ambulation activity did not occur: N/A  Walk 10 feet activity   Assist  Walk 10 feet activity did not occur: N/A        Walk 50 feet activity   Assist Walk 50 feet with 2 turns activity did not occur: N/A         Walk 150 feet activity   Assist Walk 150 feet activity did not occur: N/A         Walk 10 feet on uneven surface  activity   Assist Walk 10 feet on uneven surfaces activity did not occur: N/A         Wheelchair     Assist  Will patient use wheelchair at discharge?: Yes Type of Wheelchair: Power    Wheelchair assist level: Independent Max wheelchair distance: 150'    Wheelchair 50 feet with 2 turns activity    Assist        Assist Level: Independent   Wheelchair 150 feet activity     Assist      Assist Level: Independent   Blood pressure 127/77, pulse 79, temperature 97.8 F (36.6 C), temperature source Oral, resp. rate 18, height 5\' 6"  (1.676 m), weight 99.3 kg, SpO2 100 %.  Medical Problem List and Plan: 1.Functional deficitssecondary to debility in the setting of chronic C6 SCI (1999). Pt is power w/c dependent Continue CIR -need to acquire w/c from home  9/8- spouse brought w/c in  9/10- will order Abd binder for pt to sit up/better posture 2. Antithrombotics: -DVT/anticoagulation:Pharmaceutical:Lovenox -antiplatelet therapy: N/a 3.Chronic pain/Pain Management:Now on cymbalta daily with oxycodone prn. 4. Mood:LCSW to follow for evaluation and support. -antipsychotic agents: N/A 5. Neuropsych: This patientiscapable of making decisions on herown behalf. 6. Skin/Wound Care:Pressure relief measures--maintain adequate nutritonal status. Air mattress to prevent breakdown. Unna boots to BLE if bullous lesions recurr 7. Fluids/Electrolytes/Nutrition:Monitor I/O.  8. E coli sepsis: Continue Levaquin -- ? Day 11/14. 9. Bilateral hydronephrosis/frequent sepsis due to UTI/neurogenic bladder: Continue foley for now.Needs education on B/B program.  Continue Foley at present, will follow-up regarding long-term plans  9/8- Urology said have pt f/u for urodynamics, but for now, keep foley.  9/9- discussed plan of bladder, and explained to pt- working on leg bag and learning to empty it. 10. Neurogenic bowel:Scared to have diarrhea with laxatives and has been refusing miralax--has BM "once a month".  Patient never adopted bowel  program  Continue Senokot-s 2 tabs              -miralax qam  Attempting to change bowel program to nightly   9/8- will change to 2x/week per pt request; add lidocaine and change suppository to enemeeze 11. Anemia of chronic disease: Increase iron to daily.   Hemoglobin 8.7 on 9/7 12. Hypokalemia: Continue supplement  Potassium 3.8 on 9/7   Magnesium 1.9 on 9/3  13. Low protein stores: Added Prostat. 14. Hx of depression/anxiety:Now on cymbalta with vistaril prn for anxiety. 15. Spasticity/spasms-   Baclofen increased to 10 mg 3 times daily on 9/7  9/9- will increase Baclofen tomorrow again to 15 mg TID- might start seeing changes soon.  9/10- will increase Baclofen to 15 mg TID and see how it goes- might need Zanaflex as well.   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  Morbid obesity: Encouraged weight loss  9/9- will try weight loss meds outpt.  19. Leukocytosis  9/11- will recheck CBC with diff  in AM, earlier if symptomatic U/A is completely Negative; Lung exam clear; constipation? AD? Has BP in 150s earlier, but based on d/w team, don't want to start empiric ABX when I don't know what we are treating and will add to her ABX resistance- will recheck in AM and monitor Sx's closely and treat as required- trying to balance treating her and not treating an unknown entity. Of note, no more temps this AM/afternoon as of this note.  9/14- WBC normalized over weekend- however labs pending this AM- pt refused to get drawn til 9am- will also check U/A and Cx per pt request again.  19. Dispo  9/8- will allow pt daily outside passes as long as doesn't miss therapy and  lets staff know when leaving floor.   LOS: 11 days A FACE TO FACE EVALUATION WAS PERFORMED  Francesca Strome 03/06/2019, 10:26 AM

## 2019-03-06 NOTE — Progress Notes (Addendum)
Physical Therapy Session Note  Patient Details  Name: Tracey Hall MRN: [id number] Date of Birth: 09/03/1980  Today's Date: 03/06/2019 PT Individual Time: 1120-1209 and 9:01 to 10:16 PT Individual Time Calculation (min): 49 min and 75 min   Short Term Goals: Week 1:  PT Short Term Goal 1 (Week 1): Pt will maintain sitting balance EOB with min A consistently PT Short Term Goal 1 - Progress (Week 1): Met PT Short Term Goal 2 (Week 1): Pt will initiate slide board transfer PT Short Term Goal 2 - Progress (Week 1): Met PT Short Term Goal 3 (Week 1): Pt will be able to recall 3 pressure relief techniques PT Short Term Goal 3 - Progress (Week 1): Met Week 2:  PT Short Term Goal 1 (Week 2): =LTG due to ELOS Week 3:     Skilled Therapeutic Interventions/Progress Updates:      Therapy Documentation Precautions:  Precautions Precautions: Fall Precaution Comments: quadrapegic Restrictions Weight Bearing Restrictions: No Other Position/Activity Restrictions: does not bear weight Pain:  Denies pain this am  AM Session 1: Focus on transfer training Pain:  Denies pain Pt initially supine in bed.  Rolling L/R w/total assist for lower body positioning, strong spasms elicited when moved out of resting ring positioning.  Once LE's positioned in flexed position/bridge, pt able to complete rolling mod I.  Total assist for lower body dressing. Total assist for donning bra in long sit w/HOB elevated/supporting pt. Max assist for donning shirt  Long sit to sitting on edge of bed w/max assist. Static sitting w/supervision Pt able to lean to l elbow w/supervision for placement of board. Due to good ability to lateral wt shift, pt educated w/using momentum strategy to improve independence w/transfer. Pt then performed bed to wc w/sb using momentum w/max assist of 1.  Pt then educated re; using tilt function of wc to ease burden of care for repositioining in wc.  Pt mod I room to gym  w/pwc  Transfer training mat to/from wc using momentum strategy - repeated x 6 progressing from max assist to min assist w/level transfers.  Pt does need mod assist w/adjusting trunk from fully flexed position to upright at times. Pt demonstrated improved understanding and ability to use momentum and head/hips relationship w/training.  Pt stated she felt like she was "doing more of the work herself" using momentum.    Pt returned to room mod I w/PWC and set up w/breakfast, call bell, instructed to perform pressure relief.   AM Session 2 Pain - denies pain   Pt room to gym using PWC independently wc to/from mat x 2 using momentum/head hips w/min to mod assist, slowly, cues for positioning hands and increasing momoentum Sitting balance on edge of mat w/supervision.  UE strengthening  Triceps using pillowcase and plexiglass x 25 Shoulder and triceps extension using bolster rolling uphill on red wedge 3x30 Pt returned to room I'ly using PWC.  Needs in reach.   Therapy/Group: Individual Therapy  , PT  03/06/2019, 12:24 PM  

## 2019-03-06 NOTE — Progress Notes (Addendum)
Pt refused lab draw this am. RN spoke with pt but pt insisted that she would do it after 9am. Phlebotomist will return at 9am to draw labs. Dr. Dagoberto Ligas made aware. Gerald Stabs, RN

## 2019-03-06 NOTE — Progress Notes (Signed)
Occupational Therapy Session Note  Patient Details  Name: Tracey Hall MRN: [id number] Date of Birth: 05-25-1981  Today's Date: 03/06/2019 OT Individual Time: 1400-1500 OT Individual Time Calculation (min): 60 min    Short Term Goals: Week 2:  OT Short Term Goal 1 (Week 2): STG=LTG due to LOS  Skilled Therapeutic Interventions/Progress Updates:    Pt seen for OT session focusing on body movements/control in prep for functional transfers and UE strengthening. Pt sitting up in power w/c upon arrival, agreeable to tx session.  Addressed weight shift and head/hip relationship for sliding board transfer and anterior/posterior weight shifts in chair.  Pt completed sliding board transfer to therapy mat, mod A overall with increased time and max multi-modal cuing for weight shift and technique. Seated EOM, addressed R/L scoot and anterior/posterior with emphasis on twist, momentum and rocking needed for weight shift.  Transferred back to power w/c, set-up for downhill transfer, CGA overall with increased time and cuing. Completed UE strengthening using #2 dowel rod with B hands ACE wrapped to bar. Completed overhead press, chest press, and bicep curl with supervision/VCs for proper form and technique, x2 sets of 10 each. Pt returned to room at end of session, all needs in reach.   Therapy Documentation Precautions:  Precautions Precautions: Fall Precaution Comments: quadrapegic Restrictions Weight Bearing Restrictions: No Other Position/Activity Restrictions: does not bear weight Pain:   No/denies pain   Therapy/Group: Individual Therapy  Thomson Herbers L 03/06/2019, 3:11 PM

## 2019-03-06 NOTE — Progress Notes (Signed)
Pt leaving unit to sit outside without staff. This pm, pt was needed to go for X-ray. Pt was not present in room. RN went to [facility] for pt. This could cause a delay in care. Pt does not carry cell phone. Pt states that she does not want to be called and she does not want anyone to know her location. Educated her the risks of leaving unit without any means of communication method for cases of emergency or any accident that could happen to the patient while she is away. Discussed with Verdis Frederickson, Nursing Director regarding situation. Please advise. Gerald Stabs, RN

## 2019-03-06 NOTE — Plan of Care (Signed)
  Problem: Consults Goal: RH SPINAL CORD INJURY PATIENT EDUCATION Description:  See Patient Education module for education specifics.  Outcome: Progressing   Problem: SCI BOWEL ELIMINATION Goal: RH STG MANAGE BOWEL WITH ASSISTANCE Description: STG Manage Bowel with total Assistance. Outcome: Progressing Goal: RH STG SCI MANAGE BOWEL WITH MEDICATION WITH ASSISTANCE Description: STG SCI Manage bowel with medication with min assistance. Outcome: Progressing   Problem: SCI BLADDER ELIMINATION Goal: RH STG MANAGE BLADDER WITH EQUIPMENT WITH ASSISTANCE Description: STG Manage Bladder With Equipment With total Assistance Outcome: Progressing   Problem: RH SKIN INTEGRITY Goal: RH STG SKIN FREE OF INFECTION/BREAKDOWN Description: With min assist Outcome: Progressing   Problem: RH SAFETY Goal: RH STG ADHERE TO SAFETY PRECAUTIONS W/ASSISTANCE/DEVICE Description: STG Adhere to Safety Precautions With cues/reminders Assistance/Device. Outcome: Progressing   Problem: RH PAIN MANAGEMENT Goal: RH STG PAIN MANAGED AT OR BELOW PT'S PAIN GOAL Description: At or below level 5 Outcome: Progressing   Problem: RH KNOWLEDGE DEFICIT SCI Goal: RH STG INCREASE KNOWLEDGE OF SELF CARE AFTER SCI Description: Pt will be able to direct care at discharge using handouts and educational resources with cues/reminders Outcome: Progressing   Problem: RH Pre-functional/Other (Specify) Goal: RH LTG Pre-functional (Specify) Outcome: Progressing   Problem: RH Pre-functional/Other (Specify) Goal: RH LTG Interdisciplinary (Specify) 1 Description: RH LTG Interdisciplinary (Specify)1 Outcome: Progressing

## 2019-03-07 ENCOUNTER — Inpatient Hospital Stay (HOSPITAL_COMMUNITY): Payer: Medicare Other | Admitting: Occupational Therapy

## 2019-03-07 ENCOUNTER — Inpatient Hospital Stay (HOSPITAL_COMMUNITY): Payer: Medicare Other | Admitting: Physical Therapy

## 2019-03-07 MED ORDER — VITAMIN E 45 MG (100 UNIT) PO CAPS
100.0000 [IU] | ORAL_CAPSULE | Freq: Every day | ORAL | Status: DC
  Administered 2019-03-07 – 2019-03-10 (×3): 100 [IU] via ORAL
  Filled 2019-03-07 (×4): qty 1

## 2019-03-07 MED ORDER — HYDROCORTISONE 1 % EX CREA
TOPICAL_CREAM | Freq: Two times a day (BID) | CUTANEOUS | Status: DC
  Filled 2019-03-07: qty 28

## 2019-03-07 MED ORDER — DIAZEPAM 2 MG PO TABS
2.0000 mg | ORAL_TABLET | Freq: Two times a day (BID) | ORAL | Status: DC
  Administered 2019-03-07 – 2019-03-08 (×3): 2 mg via ORAL
  Filled 2019-03-07 (×3): qty 1

## 2019-03-07 MED ORDER — TIZANIDINE HCL 2 MG PO TABS
4.0000 mg | ORAL_TABLET | Freq: Two times a day (BID) | ORAL | Status: DC
  Administered 2019-03-07 – 2019-03-10 (×6): 4 mg via ORAL
  Filled 2019-03-07 (×6): qty 2
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
## 2019-03-07 NOTE — Progress Notes (Signed)
Orthopedic Tech Progress Note Patient Details:  Tracey Hall June 18, 1981 [id number]  Ortho Devices Type of Ortho Device: Abdominal binder       [name] 03/07/2019, 11:31 AM

## 2019-03-07 NOTE — Progress Notes (Signed)
Received order to apply hydrocortisone cream to area surrounding blister on left upper arm.  Patient states she was told by her Dermatologist not to use that medication due to some type of bad reaction she had with it before.  Dr. Dagoberto Ligas notified.  Brita Romp, RN

## 2019-03-07 NOTE — Progress Notes (Signed)
Occupational Therapy Session Note  Patient Details  Name: Tracey Hall MRN: [id number] Date of Birth: [birth year]  Today's Date: 03/07/2019 OT Individual Time: 1045-1130 OT Individual Time Calculation (min): 45 min    Short Term Goals: Week 1:  OT Short Term Goal 1 (Week 1): Pt will use bed rails with mod A for rolling for ADL tasks OT Short Term Goal 1 - Progress (Week 1): Met OT Short Term Goal 2 (Week 1): Education on leg loops for management of LEs in power chair/ bed OT Short Term Goal 2 - Progress (Week 1): Met OT Short Term Goal 3 (Week 1): Slide board transfer in ADL bed with mod A +2 for safety to secure bed OT Short Term Goal 3 - Progress (Week 1): Met  Skilled Therapeutic Interventions/Progress Updates:    1:1 Discussed need for her husband to come in and practice slide board transfers with her as an option - instead of transfers they had been doing at home. SEtup this for 10-12 for tomorrow.   Pt's abdominal binders arrived (two sizes) to help provide some stability for her core. Donned the smaller of the two in a reclined position in the power chair. Pt required total A to don but does encourage and help provide a more upright posture. Pt drove her power chair to the gym. Continued practiced with sitting forwards and back in the chair with it positioned at 75degrees with and without UE support exploring BOS boundaries without chair features. Pt also performed UE exercises with peach theraband (made loops for hands): shoulder abduction, shoulder flexion and elbow extension (pulling) for UB strengthening and conditioning  Therapy Documentation Precautions:  Precautions Precautions: Fall Precaution Comments: quadrapegic Restrictions Weight Bearing Restrictions: No Other Position/Activity Restrictions: does not bear weight General:   Vital Signs: Therapy Vitals Temp: 97.8 F (36.6 C) Temp Source: Oral Pulse Rate: 88 Resp: 16 BP: (!) 112/50 Patient Position (if  appropriate): Sitting Oxygen Therapy SpO2: 100 % O2 Device: Room Air Pain: Pt reports more spasms in LEs today and "they are not cooperating" today - declined practice with transfers today  Therapy/Group: Individual Therapy  Willeen Cass [facility] 03/07/2019, 3:54 PM

## 2019-03-07 NOTE — Plan of Care (Signed)
  Problem: Consults Goal: RH SPINAL CORD INJURY PATIENT EDUCATION Description:  See Patient Education module for education specifics.  Outcome: Progressing   Problem: SCI BOWEL ELIMINATION Goal: RH STG MANAGE BOWEL WITH ASSISTANCE Description: STG Manage Bowel with total Assistance. Outcome: Progressing Goal: RH STG SCI MANAGE BOWEL WITH MEDICATION WITH ASSISTANCE Description: STG SCI Manage bowel with medication with min assistance. Outcome: Progressing   Problem: SCI BLADDER ELIMINATION Goal: RH STG MANAGE BLADDER WITH EQUIPMENT WITH ASSISTANCE Description: STG Manage Bladder With Equipment With total Assistance Outcome: Progressing   Problem: RH SKIN INTEGRITY Goal: RH STG SKIN FREE OF INFECTION/BREAKDOWN Description: With min assist Outcome: Progressing   Problem: RH SAFETY Goal: RH STG ADHERE TO SAFETY PRECAUTIONS W/ASSISTANCE/DEVICE Description: STG Adhere to Safety Precautions With cues/reminders Assistance/Device. Outcome: Progressing   Problem: RH PAIN MANAGEMENT Goal: RH STG PAIN MANAGED AT OR BELOW PT'S PAIN GOAL Description: At or below level 5 Outcome: Progressing   Problem: RH KNOWLEDGE DEFICIT SCI Goal: RH STG INCREASE KNOWLEDGE OF SELF CARE AFTER SCI Description: Pt will be able to direct care at discharge using handouts and educational resources with cues/reminders Outcome: Progressing   Problem: RH Pre-functional/Other (Specify) Goal: RH LTG Pre-functional (Specify) Outcome: Progressing   Problem: RH Pre-functional/Other (Specify) Goal: RH LTG Interdisciplinary (Specify) 1 Description: RH LTG Interdisciplinary (Specify)1 Outcome: Progressing

## 2019-03-07 NOTE — Progress Notes (Signed)
Physical Therapy Session Note  Patient Details  Name: Tracey Hall MRN: [id number] Date of Birth: 12-18-1980  Today's Date: 03/07/2019 PT Individual Time: 1300-1415 PT Individual Time Calculation (min): 75 min   Short Term Goals: Week 2:  PT Short Term Goal 1 (Week 2): =LTG due to ELOS  Skilled Therapeutic Interventions/Progress Updates:    Pt received seated in power w/c in room, agreeable to PT session. No complaints of pain, pt does report increase in spasm frequency and intensity this date. Pt declines to perform any functional transfers this session due to fear of falling or pain from spasms. Session focus on SCI education and reviewing functional tasks. Assisted pt with emptying her foley catheter bag. Pt requires assist to hold up catheter drain to prevent spilling while she unscrews the top with use of BUE. Pt is then able to empty the bag into a urinal set up on the gait belt wrapped around her legs. Pt is able to close catheter bag with then is setup A for washing her hands and catheter drain. Pt requires assist to empty urinal. Reviewed SCI notebook and provided pt with a pen to mark down areas she has questions in and write down any other questions that come up prior to d/c. Pt reports feeling frustrated with not having peer support from other people with quadraparesis/quadraplegia and that she frequently questions whether she is performing tasks the correct way. Education with patient about energy conservation and letting her family assist her with tasks that even though she can perform herself would take a lot of energy and to save her energy for more meaningful tasks. Pt reports frequently using YouTube videos to learn various methods of how to do things based on her level of injury, encouraged her to continue this. Provided pt with another home measurement sheet to have her husband fill out prior to family education session tomorrow. Pt left seated in power w/c in room with needs in  reach at end of session.  Therapy Documentation Precautions:  Precautions Precautions: Fall Precaution Comments: quadrapegic Restrictions Weight Bearing Restrictions: No Other Position/Activity Restrictions: does not bear weight    Therapy/Group: Individual Therapy   Excell Seltzer, PT, DPT  03/07/2019, 3:50 PM

## 2019-03-07 NOTE — Progress Notes (Signed)
Occupational Therapy Session Note  Patient Details  Name: Tracey Hall MRN: [id number] Date of Birth: August 15, 1980  Today's Date: 03/07/2019 OT Individual Time: [id number] OT Individual Time Calculation (min): 75 min    Short Term Goals: Week 2:  OT Short Term Goal 1 (Week 2): STG=LTG due to LOS  Skilled Therapeutic Interventions/Progress Updates:    Pt seen for OT session focusing on functional mobility, spasm management and ADL re-training. Pt in supine with MD present completing AM Carlisle Torgeson, pt agreeable to tx session and plan for shower.  Rolled with mod A for management of LEs in order to have maximove sling placed. Max A with increased time required for management of B LEs during bed mobility 2/2 severe spasms. Lift used to transfer pt to roll in shower chair. Despite attempts at repositioning pt, use of safety belts and reclining functions of chair, deemed unsafe to have to pt sit in roll in shower chair for bathing tasks 2/2 spasms. Therefore utilized lift to return to bed. Will re-attempt shower tomorrow during PM session when spasms are less intense.  She completed dressing task from bed level, max A for donning bra, min A to pull down shirt in back, max A overall for pants, pt able to manage LEs while therapist assisted in threading. She rolled with min A in order to have pants pulled up total A.  Pt left in supine at end of session, all needs in reach.  RN present for portion of therapy session with joint education being completed from OT/RN regarding catheter leg bag for daytime use and use of Foley at night time. Working on pt being able to empty leg bag independently, will require assist when transitioning from Foley to leg bag.   Therapy Documentation Precautions:  Precautions Precautions: Fall Precaution Comments: quadrapegic Restrictions Weight Bearing Restrictions: (P) No Other Position/Activity Restrictions: does not bear weight   Therapy/Group: Individual  Therapy  Camillo Quadros L 03/07/2019, 6:54 AM

## 2019-03-08 ENCOUNTER — Inpatient Hospital Stay (HOSPITAL_COMMUNITY): Payer: Medicare Other | Admitting: Occupational Therapy

## 2019-03-08 ENCOUNTER — Encounter (HOSPITAL_COMMUNITY): Payer: Medicare Other | Admitting: Occupational Therapy

## 2019-03-08 ENCOUNTER — Ambulatory Visit (HOSPITAL_COMMUNITY): Payer: Medicare Other | Admitting: Physical Therapy

## 2019-03-08 LAB — URINE CULTURE: Culture: <10000 — AB

## 2019-03-08 MED ORDER — DIAZEPAM 5 MG PO TABS
5.0000 mg | ORAL_TABLET | Freq: Two times a day (BID) | ORAL | Status: DC
  Administered 2019-03-08 – 2019-03-10 (×4): 5 mg via ORAL
  Filled 2019-03-08 (×4): qty 1

## 2019-03-08 MED ORDER — SILVER SULFADIAZINE 1 % EX CREA
TOPICAL_CREAM | Freq: Two times a day (BID) | CUTANEOUS | Status: DC
  Administered 2019-03-08 – 2019-03-09 (×3): via TOPICAL
  Filled 2019-03-08: qty 85

## 2019-03-08 MED ORDER — DIAZEPAM 2 MG PO TABS
2.0000 mg | ORAL_TABLET | Freq: Once | ORAL | Status: AC
  Administered 2019-03-08: 2 mg via ORAL
  Filled 2019-03-08: qty 1

## 2019-03-08 NOTE — Progress Notes (Signed)
Physical Therapy Session Note  Patient Details  Name: Tracey Hall MRN: [id number] Date of Birth: June 27, 1980  Today's Date: 03/08/2019 PT Individual Time: 1000-1100 PT Individual Time Calculation (min): 60 min   Short Term Goals: Week 2:  PT Short Term Goal 1 (Week 2): =LTG due to ELOS  Skilled Therapeutic Interventions/Progress Updates:    Pt received seated in bed with husband Darryl present for hands-on family education system. Coralyn Pear is assisting pt with bed-level bathing. Assisted pt with donning pants, bra, and shirt. Pt reports feeling as if she had a BM and soiled her brief. Upon inspection pt has not had a BM but did start her menstrual cycle. Pt is total A for pericare and brief change. Demonstrated with Darryl how to have pt roll L/R with min A in order to pull pants up over hips. Recommended bedrails at home as pt relies on these heavily and does not have them at home. Rolling L/R with min A and bedrails to dependently don abdominal binder. Supine to sit with mod A with HOB elevated. Demonstrated how to don B leg loops for increased pt independence with LE mobility. Pt exhibits increased difficulty with maintaining sitting balance this date with min A required to maintain balance vs close SBA. Demonstrated how to safely set up slide board transfer and discussed head/hips relationship during transfer with Darryl. Pt is max A for SB transfer this date, exhibits decreased ability to assist with transfer. Also transferring to CIR power chair as pt's power chair is not working as of last night. Discussed home setup and that likely with pt's current bed and car she will not likely be able to safely transfer via slide board due to height difference. Recommending pt and her family look into a height adjustable bed or a lower bed and a lower car seat height. Pt and her family receptive to education. Pt left seated in power w/c in room with needs in reach, husband present.  Therapy  Documentation Precautions:  Precautions Precautions: Fall Precaution Comments: quadrapegic Restrictions Weight Bearing Restrictions: No Other Position/Activity Restrictions: does not bear weight    Therapy/Group: Individual Therapy   Excell Seltzer, PT, DPT  03/08/2019, 12:41 PM
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
## 2019-03-08 NOTE — Progress Notes (Signed)
Gave printouts discussing proper foley care and sexual intercourse with a catheter to the patient.  Will demonstrate teaching with her and significant other tomorrow.  Brita Romp, RN
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
## 2019-03-08 NOTE — Progress Notes (Addendum)
Social Work Patient ID: Tracey Hall, female   DOB: 09/07/1980, 37 y.o.   MRN: [id number]   CSW met with pt to update her on team conference discussion and targeted d/c date of 03-10-19.  Pt feels ready to go home and boyfriend has started family education and will meet with nurses tomorrow.  Josh Cadle from [employer] will come tomorrow to look at pt's power w/c that is not working.  CSW will order 30" slide board and arrange HH and order cath supplies for pt to have in place after HH is finished.  CSW remains available to assist as needed.  

## 2019-03-08 NOTE — Progress Notes (Signed)
Occupational Therapy Session Note  Patient Details  Name: Tracey Hall MRN: [id number] Date of Birth: Aug 26, 1980  Today's Date: 03/08/2019 OT Individual Time: 1345-1455 OT Individual Time Calculation (min): 70 min    Short Term Goals: Week 2:  OT Short Term Goal 1 (Week 2): STG=LTG due to LOS  Skilled Therapeutic Interventions/Progress Updates:    Pt seen for OT session focusing on cont family transfer training, SCI education and UE strengthening. Pt sitting upright in power w/c upon arrival, husband present for transfer training portion only of session. Pt denied pain and reports spasms more under control. She declined shower originally planned for today.   Transfer Training: Pt's husband assisted with sliding board transfer power w/c<> EOM in therapy gym. Pt able to direct caregiver independently for set-up and technique of transfer. Pt's husband demonstrates ability to provide the mod-max A pt requires for sliding board transfers. He declined any further questions/need for practice and exited session.  SCI Education: Extensive education/discussion with pt regarding d/c planning, SCI, bowel/bladder program, spasm management and functional implications, reducing caregiver burden, UE HEP, pressure relief schedule, continuum of care, DME and d/c planning.   UE Strengthening: Completed x3 sets of 10 using #2 dowel rod with B hands ACE wrapped to dowel rod  Overhead press,  Chest press  Bicep curl/tricep extension  Punch R/Punch L.   Pt returned to room at end of session, left with all needs in reach.   Therapy Documentation Precautions:  Precautions Precautions: Fall Precaution Comments: quadrapegic Restrictions Weight Bearing Restrictions: No Other Position/Activity Restrictions: does not bear weight   Therapy/Group: Individual Therapy  Monte Bronder L 03/08/2019, 7:14 AM

## 2019-03-08 NOTE — Plan of Care (Signed)
  Problem: Consults Goal: RH SPINAL CORD INJURY PATIENT EDUCATION Description:  See Patient Education module for education specifics.  Outcome: Progressing   Problem: SCI BOWEL ELIMINATION Goal: RH STG MANAGE BOWEL WITH ASSISTANCE Description: STG Manage Bowel with total Assistance. Outcome: Progressing Goal: RH STG SCI MANAGE BOWEL WITH MEDICATION WITH ASSISTANCE Description: STG SCI Manage bowel with medication with min assistance. Outcome: Progressing   Problem: SCI BLADDER ELIMINATION Goal: RH STG MANAGE BLADDER WITH EQUIPMENT WITH ASSISTANCE Description: STG Manage Bladder With Equipment With total Assistance Outcome: Progressing   Problem: RH SKIN INTEGRITY Goal: RH STG SKIN FREE OF INFECTION/BREAKDOWN Description: With min assist Outcome: Progressing   Problem: RH SAFETY Goal: RH STG ADHERE TO SAFETY PRECAUTIONS W/ASSISTANCE/DEVICE Description: STG Adhere to Safety Precautions With cues/reminders Assistance/Device. Outcome: Progressing   Problem: RH PAIN MANAGEMENT Goal: RH STG PAIN MANAGED AT OR BELOW PT'S PAIN GOAL Description: At or below level 5 Outcome: Progressing   Problem: RH KNOWLEDGE DEFICIT SCI Goal: RH STG INCREASE KNOWLEDGE OF SELF CARE AFTER SCI Description: Pt will be able to direct care at discharge using handouts and educational resources with cues/reminders Outcome: Progressing   Problem: RH Pre-functional/Other (Specify) Goal: RH LTG Pre-functional (Specify) Outcome: Progressing   Problem: RH Pre-functional/Other (Specify) Goal: RH LTG Interdisciplinary (Specify) 1 Description: RH LTG Interdisciplinary (Specify)1 Outcome: Progressing

## 2019-03-08 NOTE — Progress Notes (Signed)
Occupational Therapy Session Note  Patient Details  Name: NALAYA WOJDYLA MRN: [id number] Date of Birth: 1981/06/01  Today's Date: 03/08/2019 OT Individual Time: 1100-1155 OT Individual Time Calculation (min): 55 min    Short Term Goals: Week 1:  OT Short Term Goal 1 (Week 1): Pt will use bed rails with mod A for rolling for ADL tasks OT Short Term Goal 1 - Progress (Week 1): Met OT Short Term Goal 2 (Week 1): Education on leg loops for management of LEs in power chair/ bed OT Short Term Goal 2 - Progress (Week 1): Met OT Short Term Goal 3 (Week 1): Slide board transfer in ADL bed with mod A +2 for safety to secure bed OT Short Term Goal 3 - Progress (Week 1): Met  Skilled Therapeutic Interventions/Progress Updates:    1:1 Education and practice with pt and pt's husband on slide board transfers as an option instead of picking her up and long term issues with this method. Practiced uneven v even transfers with proper slide board placement. Uneven transfers to her current bed and car are not a safe option and explored possibilities of different options. Husband and pt able to perform safe transfers with cues as needed for placement of board and husbands hand positioning.   Therapy Documentation Precautions:  Precautions Precautions: Fall Precaution Comments: quadrapegic Restrictions Weight Bearing Restrictions: No Other Position/Activity Restrictions: does not bear weight General:   Vital Signs: Therapy Vitals Temp: 98.5 F (36.9 C) Temp Source: Oral Pulse Rate: 92 Resp: 18 BP: (!) 135/121 Patient Position (if appropriate): Sitting Oxygen Therapy SpO2: 100 % O2 Device: Room Air Pain:  no c/o pain  Therapy/Group: Individual Therapy  Willeen Cass [facility] 03/08/2019, 4:06 PM
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
## 2019-03-08 NOTE — Patient Care Conference (Signed)
Inpatient RehabilitationTeam Conference and Plan of Care Update Date: 03/08/2019   Time: 9:50 AM    Patient Name: Tracey Hall      Medical Record Number: [id number]  Date of Birth: May 30, 1981 Sex: Female         Room/Bed: 4M12C/4M12C-01 Payor Info: Payor: MEDICARE / Plan: MEDICARE PART A AND B / Product Type: *No Product type* /    Admit Date/Time:  02/23/2019 10:21 AM  Primary Diagnosis:  Debility  Patient Active Problem List   Diagnosis Date Noted  . 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 Anemia of chronic disease   . Muscle spasm of both lower legs   . Neurogenic bowel 02/26/2019  . Paraplegia ([place]) 02/24/2019  . Debility 02/23/2019  . Sepsis ([place]) 02/18/2019  . Chronic pain syndrome 10/14/2018  . Complication of feeding tube ([place])   . Intubation of airway performed without difficulty   . Tachypnea   . Peptic ulcer disease with hemorrhage 08/03/2017  . E coli bacteremia 08/03/2017  . Sepsis due to urinary tract infection ([place]) 07/31/2017  . Bullous dermatitis 10/13/2016  . AKI (acute kidney injury) ([place]) 09/16/2016  . Hypokalemia 09/16/2016  . Obesity 02/19/2012  . Recurrent UTI 09/15/2010  . Asthma 09/15/2010  . Fever 08/21/2010  . Anxiety state 01/10/2010  . Neurogenic bladder 01/04/2007  . Anemia 12/06/2006  . Major depressive disorder, recurrent episode ([place]) 08/19/2006  . 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 Courtney Heys Social Worker Present: Alfonse Alpers, LCSW Nurse Present: Rayetta Pigg, RN PT Present: Excell Seltzer, PT OT Present: Amy Rounds, OT SLP Present: Stormy Fabian, SLP PPS Coordinator present : Gunnar Fusi, SLP     Current Status/Progress Goal Weekly Team Focus  Bowel/Bladder   Bowel program twice weekly; foley  Bowel with max/total assist; bladder with max/total assist  Assess efficacy of bowel  program and reinforce teaching on switching from standard drainage bag to leg bag   Swallow/Nutrition/ Hydration             ADL's   Mod-max A sliding board transfers, mod A LB dressing bed level, min A UB bathing/dressing  Max A LB dressing, demonstrate safe transfer technique with spouse, SCI education  Functional transfers, ADL re-training, family education, catheter and SCI education, d/c planning.   Mobility   min A rolling, mod A supine to/from sit, mod to max A SB transfers, mod I power w/c  min A overall  family edu prior to d/c home   Communication             Safety/Cognition/ Behavioral Observations            Pain   C/o spasms- on Valium, zanaflex- d/c'd baclofen  < 4  Assess and treat for pain q shift and prn   Skin   Blister to left upper arm- tegaderm on- vitamin E oil ordered; refusing hydrocortisone cream due to adverse reaction previously  Maintain skin integrity with min assist  Assess skin q shift and prn    Rehab Goals Patient on target to meet rehab goals: Yes Rehab Goals Revised: none *See Care Plan and progress notes for long and short-term goals.     Barriers to Discharge  Current Status/Progress Possible Resolutions Date Resolved   Nursing                  PT  OT                  SLP                SW                Discharge Planning/Teaching Needs:  Pt plans to return home to her family.  They can assist her as needed.  Boyfriend had begun family education and will come for nursing education.   Team Discussion:  Pt is having trouble with spasms and Dr. Dagoberto Ligas is trying to get these under control.  Medications tried have not worked and Dr. Dagoberto Ligas has increased Valium and thinks this will help, but that pt may need a baclofen pump over time and she will f/u with her in the outpt clinic.  Pt is still doing bowel program and foley teaching is ongoing.  Family education is ongoing.  Nursing is using vitamin E on blister and MD has  discontinued hydrocortisone.  RN has called [employer] about pt's power w/c not working.  Pt is doing well with OT and they tried to do shower yesterday, but pt spasms prevented this.  Pt can do ADLs at min/mod A bed level.  Pt is mod A with bed mobility and mod/max A for slide board txs.  Pt will need leg bag/cath supplies and CSW will work on ordering these.  Revisions to Treatment Plan:  none    Medical Summary Current Status: spasticity an issue- working on it; bullae eczema Weekly Focus/Goal: improve spasticity  Barriers to Discharge: Behavior;Home enviroment access/layout;Neurogenic Bowel & Bladder;Medical stability;Medication compliance;Other (comments);Wound care;Weight  Barriers to Discharge Comments: spasticity Possible Resolutions to Barriers: working on titrating meds   Continued Need for Acute Rehabilitation Level of Care: The patient requires daily medical management by a physician with specialized training in physical medicine and rehabilitation for the following reasons: Direction of a multidisciplinary physical rehabilitation program to maximize functional independence : Yes Medical management of patient stability for increased activity during participation in an intensive rehabilitation regime.: Yes Analysis of laboratory values and/or radiology reports with any subsequent need for medication adjustment and/or medical intervention. : Yes   I attest that I was present, lead the team conference, and concur with the assessment and plan of the team.Team conference was held via web/ teleconference due to [place] - 19.   Brynlea Spindler, Silvestre Mesi 03/08/2019, 9:51 PM

## 2019-03-09 ENCOUNTER — Inpatient Hospital Stay (HOSPITAL_COMMUNITY): Payer: Medicare Other | Admitting: Physical Therapy

## 2019-03-09 ENCOUNTER — Inpatient Hospital Stay (HOSPITAL_COMMUNITY): Payer: Medicare Other | Admitting: Occupational Therapy

## 2019-03-09 DIAGNOSIS — R5381 Other malaise: Secondary | ICD-10-CM

## 2019-03-09 DIAGNOSIS — R5383 Other fatigue: Secondary | ICD-10-CM

## 2019-03-09 MED ORDER — TRAZODONE HCL 50 MG PO TABS: 50.0000 mg | ORAL_TABLET | Freq: Every evening | ORAL | 0 refills | Status: DC | PRN

## 2019-03-09 MED ORDER — TIZANIDINE HCL 4 MG PO TABS: 4.0000 mg | ORAL_TABLET | Freq: Two times a day (BID) | ORAL | 0 refills | Status: DC

## 2019-03-09 MED ORDER — SILVER SULFADIAZINE 1 % EX CREA: TOPICAL_CREAM | Freq: Two times a day (BID) | CUTANEOUS | 0 refills | Status: DC

## 2019-03-09 MED ORDER — SENNA 8.6 MG PO TABS: 2.0000 | ORAL_TABLET | Freq: Two times a day (BID) | ORAL | 0 refills | Status: DC

## 2019-03-09 MED ORDER — DOCUSATE SODIUM 283 MG RE ENEM: ENEMA | RECTAL | 0 refills | Status: DC

## 2019-03-09 MED ORDER — FERROUS SULFATE 325 (65 FE) MG PO TABS: 325.0000 mg | ORAL_TABLET | Freq: Two times a day (BID) | ORAL | 0 refills | Status: DC

## 2019-03-09 MED ORDER — DIAZEPAM 5 MG PO TABS: 5.0000 mg | ORAL_TABLET | Freq: Two times a day (BID) | ORAL | 0 refills | Status: DC

## 2019-03-09 MED ORDER — VITAMIN E 45 MG (100 UNIT) PO CAPS: 100.0000 [IU] | ORAL_CAPSULE | Freq: Every day | ORAL | 0 refills | Status: DC

## 2019-03-09 MED ORDER — DULOXETINE HCL 30 MG PO CPEP: 30.0000 mg | ORAL_CAPSULE | Freq: Every day | ORAL | 0 refills | Status: DC

## 2019-03-09 MED ORDER — SILVER SULFADIAZINE 1 % EX CREA
TOPICAL_CREAM | Freq: Three times a day (TID) | CUTANEOUS | Status: DC
  Administered 2019-03-09 – 2019-03-10 (×3): via TOPICAL
  Filled 2019-03-09: qty 85

## 2019-03-09 MED ORDER — LIDOCAINE HCL URETHRAL/MUCOSAL 2 % EX GEL: CUTANEOUS | 0 refills | Status: DC

## 2019-03-09 MED ORDER — POTASSIUM CHLORIDE CRYS ER 20 MEQ PO TBCR: 40.0000 meq | EXTENDED_RELEASE_TABLET | ORAL | 0 refills | Status: DC

## 2019-03-09 MED ORDER — INFLUENZA VAC SPLIT QUAD 0.5 ML IM SUSY
0.5000 mL | PREFILLED_SYRINGE | INTRAMUSCULAR | Status: DC
  Filled 2019-03-09: qty 0.5

## 2019-03-09 NOTE — Progress Notes (Signed)
[facility] PHYSICAL MEDICINE & REHABILITATION PROGRESS NOTE   Subjective/Complaints: Patient seen lying in bed this morning.  She states she did not sleep well overnight, stating she was in pain.  She states she continues to have muscle spasms.  She asks me to reposition her lower extremities.  ROS: + Generalized pain.  Denies CP, shortness of breath, nausea, vomiting, diarrhea.  Objective:   No results found. No results for input(s): WBC, HGB, HCT, PLT in the last 72 hours. No results for input(s): NA, K, CL, CO2, GLUCOSE, BUN, CREATININE, CALCIUM in the last 72 hours.  Intake/Output Summary (Last 24 hours) at 03/09/2019 1150 Last data filed at 03/09/2019 0950 Gross per 24 hour  Intake 120 ml  Output 1550 ml  Net -1430 ml     Physical Exam: Vital Signs Blood pressure 140/69, pulse 85, temperature 97.7 F (36.5 C), temperature source Oral, resp. rate 16, height 5\' 6"  (1.676 m), weight 99.3 kg, SpO2 100 %. Constitutional: No distress . Vital signs reviewed.  Morbidly obese. HENT: Normocephalic.  Atraumatic. Eyes: EOMI. No discharge. Cardiovascular: No JVD. Respiratory: Normal effort.  No stridor. GI: Non-distended. GU: Foley. Skin: Warm and dry.  Intact. Psych: Flat. Musc: Lower extremity contractures as well as hands. Neurological: Alert and oriented. C6 motor level. Intrinsic and deformities Bilateral lower extremities: 0/5 proximal distal Sensation absent to light touch bilateral lower extremities  Assessment/Plan: 1. Functional deficits secondary to debility from sepsis due to UTI with associated C6 quadriplegia ASIA A which require 3+ hours per day of interdisciplinary therapy in a comprehensive inpatient rehab setting.  Physiatrist is providing close team supervision and 24 hour management of active medical problems listed below.  Physiatrist and rehab team continue to assess barriers to discharge/monitor patient progress toward functional and medical goals  Care  Tool:  Bathing    Body parts bathed by patient: Right arm, Left arm, Chest, Abdomen, Right upper leg, Left upper leg, Right lower leg, Left lower leg, Face   Body parts bathed by helper: Front perineal area, Buttocks     Bathing assist Assist Level: Minimal Assistance - Patient > 75%     Upper Body Dressing/Undressing Upper body dressing   What is the patient wearing?: Bra, Pull over shirt    Upper body assist Assist Level: Moderate Assistance - Patient 50 - 74%    Lower Body Dressing/Undressing Lower body dressing      What is the patient wearing?: Pants     Lower body assist Assist for lower body dressing: Maximal Assistance - Patient 25 - 49%     Toileting Toileting Toileting Activity did not occur (Clothing management and hygiene only): N/A (no void or bm)  Toileting assist Assist for toileting: 2 Helpers(Per LPN report )     Transfers Chair/bed transfer  Transfers assist     Chair/bed transfer assist level: Maximal Assistance - Patient 25 - 49%     Locomotion Ambulation   Ambulation assist   Ambulation activity did not occur: N/A          Walk 10 feet activity   Assist  Walk 10 feet activity did not occur: N/A        Walk 50 feet activity   Assist Walk 50 feet with 2 turns activity did not occur: N/A         Walk 150 feet activity   Assist Walk 150 feet activity did not occur: N/A         Walk 10 feet on  uneven surface  activity   Assist Walk 10 feet on uneven surfaces activity did not occur: N/A         Wheelchair     Assist Will patient use wheelchair at discharge?: Yes Type of Wheelchair: Power    Wheelchair assist level: Independent Max wheelchair distance: 150'    Wheelchair 50 feet with 2 turns activity    Assist        Assist Level: Independent   Wheelchair 150 feet activity     Assist      Assist Level: Independent   Blood pressure 140/69, pulse 85, temperature 97.7 F (36.5  C), temperature source Oral, resp. rate 16, height 5\' 6"  (1.676 m), weight 99.3 kg, SpO2 100 %.  Medical Problem List and Plan: 1.Functional deficitssecondary to debility in the setting of chronic C6 SCI (1999). Pt is power w/c dependent Continue CIR, plan for DC tomorrow. 2. Antithrombotics: -DVT/anticoagulation:Pharmaceutical:Lovenox -antiplatelet therapy: N/a 3.Chronic pain/Pain Management:Now on cymbalta daily with oxycodone prn. 4. Mood:LCSW to follow for evaluation and support. -antipsychotic agents: N/A 5. Neuropsych: This patientiscapable of making decisions on herown behalf. 6. Skin/Wound Care:Pressure relief measures--maintain adequate nutritonal status. Air mattress to prevent breakdown. Unna boots to BLE if bullous lesions recur  9/16- bullae L arm- started vitamin E oil daily.  7. Fluids/Electrolytes/Nutrition:Monitor I/O.  8. E coli sepsis: Completed Levaquin  9. Bilateral hydronephrosis/frequent sepsis due to UTI/neurogenic bladder:   Continue foley for now.Needs education on B/B program.  9/8- Urology said have pt f/u for urodynamics  Maintain Foley at discharge 10. Neurogenic bowel:Scared to have diarrhea with laxatives and has been refusing miralax--has BM "once a month".  Patient never adopted bowel program  Continue Senokot-s 2 tabs              -miralax qam  Attempting to change bowel program to nightly   9/8- will change to 2x/week per pt request; add lidocaine and change suppository to enemeeze  Some improvement  11. Anemia of chronic disease: Increase iron to daily.   Hemoglobin 9.6 on 9/14 12. Hypokalemia: Continue supplement  Potassium 3.9 on 9/14  Magnesium 1.9 on 9/3  13. Low protein stores: Added Prostat. 14. Hx of depression/anxiety:Now on cymbalta with vistaril prn for anxiety. 15. Spasticity/spasms-   Baclofen d/ced due to lethargy/weakness  Increased Zanaflex to 4  mg IUD and added Valium 2 mg BID for spasms, increased Valium to 5 mg BID for spasms  Will send home on these medications, will need further follow-up and likely further adjustments in ambulatory setting. 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  Morbid obesity: Encouraged weight loss 19. Leukocytosis: Resolved  CXR, UA/Ucx neg 20. Bullae Eczema   Allergic to cortisone, continue, con't vit E  LOS: 14 days A FACE TO FACE EVALUATION WAS PERFORMED   Lorie Phenix 03/09/2019, 11:50 AM
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
## 2019-03-09 NOTE — Progress Notes (Signed)
Occupational Therapy Session Note  Patient Details  Name: Tracey Hall MRN: [id number] Date of Birth: [birth year]  Today's Date: 03/09/2019 OT Individual Time: 1100-1155 OT Individual Time Calculation (min): 55 min    Short Term Goals: Week 2:  OT Short Term Goal 1 (Week 2): STG=LTG due to LOS  Skilled Therapeutic Interventions/Progress Updates:    Pt seen for OT session focusing on functional transfers and education. Pt asleep in power w/c upon arrival, increased time to arouse and pt appearing lethargic throughout session.   Functional Mobility: Managed power w/c throughout unit mod I Sliding board transfer w/c<>EOM, max A to EOM and +2 required for return to w/c secondary to fatigue.   Education: Extensive education/discussion and practice having pt direct therapist/caregiver with transfer and mobility techniques. Pt initially becoming very frustrated when caregiver did not understand verbal directions pt gave. Once returned to w/c, extensive discussion regarding techniques for improved communication and direction of caregiver and importance of directing care. Pt voiced understanding and agreement with recommendations made.  Pt returned to room at end of session, left sitting up in power w/c with all needs in reach.   Therapy Documentation Precautions:  Precautions Precautions: Fall Precaution Comments: quadrapegic Restrictions Weight Bearing Restrictions: No Other Position/Activity Restrictions: does not bear weight   Therapy/Group: Individual Therapy  Adeli Frost L 03/09/2019, 7:02 AM

## 2019-03-09 NOTE — Progress Notes (Addendum)
Patient significant other (Darryl) present today for education on foley care (including switching from leg bag to standard drainage bag) and education on sexual relationships after SCI.  I had given the patient some educational handouts to look over yesterday discussing the subject and briefly explained the technique of taping catheter up to stomach and then returning catheter to previous position post coitus.  Informed just now that the MD suggests patient and Darryl should be instructed on removing the foley catheter prior to intercourse and replacing it afterwards as their sexual routine.  Discussed importance of perineal hygiene both before and after sex.  Also educated patient and Darryl on the difference between "clean technique" and "sterile technique" and that sterile technique is necessary for indwelling catheter insertion to reduce infection risk.  Darryl mentioned that he has had some experience with inserting catheters before but patient states they had not done intermittent caths before.  Due to time constraints (Darryl needed to leave for work at 1400), Darryl wished to video the process for future reference and patient agreed.  Therefore, RN demonstrated proper sterile technique for foley insertion.  Patient and significant other felt comfortable with being able to perform skill at home.  Will speak with SW about updated DME needs given the possible frequency of catheter exchanges at home.  Will continue to reinforce education before discharge tomorrow.  Brita Romp, RN

## 2019-03-09 NOTE — Progress Notes (Signed)
Recreational Therapy Discharge Summary Patient Details  Name: Tracey Hall MRN: [id number] Date of Birth: Jan 26, 1981 Today's Date: 03/09/2019  Long term goals set: 1  Long term goals met: 1  Comments on progress toward goals: TR sessions focused on leisure assessment,pt education and community resources.  Pt demonstrated self advocacy throughout LOS in regards to directing her care and identifying her leisure needs.  Discussed activity analysis with potential modifications and as well as community recreation/leisure resources and provided pt with contact information for discussed interests.  Pt is excited about upcoming discharge. Reasons for discharge: treatment goals met  Patient/family agrees with progress made and goals achieved: Yes  Tracey Hall 03/09/2019, 9:37 AM

## 2019-03-09 NOTE — Progress Notes (Signed)
Occupational Therapy Discharge Summary  Patient Details  Name: Tracey Hall MRN: [id number] Date of Birth: 1981-06-15   Patient has met 4 of 4 long term goals due to improved activity tolerance, improved balance, postural control and improved coordination.  Patient to discharge at [facility] Max Assist level.  Patient's care partner is independent to provide the necessary physical assistance at discharge.  Pt's husband and adult children were providing this level of care PTA and are able and willing to cont to provide this level of care at d/c. Pt's husband attended hands on family education session to address safety in functional transfers, SCI education including bowel/bladder program. She completes ADLs from bed level at mod-max A. Sliding board transfers with mod-max A.    Recommendation:  Patient will benefit from ongoing skilled OT services in home health setting to continue to advance functional skills in the area of BADL and Reduce care partner burden.  Equipment: Sliding board  Reasons for discharge: treatment goals met and discharge from hospital  Patient/family agrees with progress made and goals achieved: Yes  OT Discharge Precautions/Restrictions  Precautions Precautions: Fall Precaution Comments: quadrapegic Restrictions Weight Bearing Restrictions: No Vision Baseline Vision/History: No visual deficits Patient Visual Report: No change from baseline Vision Assessment?: No apparent visual deficits Perception  Perception: Within Functional Limits Praxis Praxis: Intact Cognition Overall Cognitive Status: Within Functional Limits for tasks assessed Arousal/Alertness: Awake/alert Orientation Level: Oriented X4 Sustained Attention: Appears intact Memory: Appears intact Awareness: Appears intact Problem Solving: Appears intact Safety/Judgment: Appears intact Sensation Sensation Light Touch: Impaired Detail Peripheral sensation comments: Impaired 2/2 SCI C-6 Light  Touch Impaired Details: Impaired RUE;Impaired LUE;Absent RLE;Absent LLE Hot/Cold Impaired Details: Absent RLE;Absent LLE;Impaired RUE;Impaired LUE Proprioception: Impaired Detail Proprioception Impaired Details: Impaired RUE;Impaired LUE;Absent RLE;Absent LLE Coordination Gross Motor Movements are Fluid and Coordinated: No Fine Motor Movements are Fluid and Coordinated: No Coordination and Movement Description: 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 Motor  Motor Motor: Abnormal tone;Tetraplegia;Abnormal postural alignment and control Motor - Skilled Clinical Observations: 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 Trunk/Postural Assessment  Cervical Assessment Cervical Assessment: Exceptions to WFL(Forward head) Thoracic Assessment Thoracic Assessment: Exceptions to WFL(Kyphotic) Lumbar Assessment Lumbar Assessment: Exceptions to WFL(Posterior pelvic tilt) Postural Control Postural Control: Deficits on evaluation Trunk Control: impaired 2/2 weak core Righting Reactions: impaired Protective Responses: impaired Postural Limitations: impaired  Balance Balance Balance Assessed: Yes Static Sitting Balance Static Sitting - Balance Support: Bilateral upper extremity supported;Feet unsupported Static Sitting - Level of Assistance: 5: Stand by assistance Static Sitting - Comment/# of Minutes: Sitting EOM Dynamic Sitting Balance Dynamic Sitting - Balance Support: No upper extremity supported;Feet unsupported;During functional activity Dynamic Sitting - Level of Assistance: 4: Min assist;3: Mod assist Extremity/Trunk Assessment RUE Assessment RUE Assessment: Exceptions to [facility] Active Range of Motion (AROM) Comments: shoulder 0-90 General Strength Comments: 2-/5 shoulder, bicep tendodsis present and useful RUE Body System: Neuro LUE Assessment LUE Assessment: Exceptions to [facility] Passive Range of Motion (PROM) Comments: WFL Active Range of Motion (AROM) Comments: 0-90 degrees in shoulder General  Strength Comments: 2-/5 shoulder, bicep tendodsis present and useful   Kourosh Jablonsky L 03/09/2019, 12:58 PM

## 2019-03-09 NOTE — Progress Notes (Signed)
Physical Therapy Discharge Summary  Patient Details  Name: Tracey Hall MRN: [id number] Date of Birth: Apr 27, 1981  Today's Date: 03/09/2019 PT Individual ONGE:9528-4132; 4401-0272 PT Individual Time Calculation (min): 45 min and 25 min    Patient has met 3 of 6 long term goals due to improved activity tolerance, improved balance, improved postural control and ability to compensate for deficits.  Patient to discharge at a wheelchair level [place].   Patient's care partner is independent to provide the necessary physical assistance at discharge. Pt's significant other has completed hands on training and has demonstrated ability to assist pt with performing safe slide board transfers. Education has been provided to patient and her significant other with regards to how to better set up their environment at home for improved safety with bed to/from chair and chair to/from car transfers via slide board.  Reasons goals not met: Pt did not meet sitting balance goal of Supervision and still requires min to mod A to maintain dynamic sitting balance. Pt did not meet transfer goal of min A overall and still requires mod A for safe slide board transfers. Pt did not meet car transfer goal of mod A as she requires assist x 2 for safe car transfer. Pt has met goals adequately for a safe d/c home as her family was providing this level of assist prior to admission.  Recommendation:  Patient will benefit from ongoing skilled PT services in home health setting to continue to advance safe functional mobility, address ongoing impairments in balance, strength, safety, independence with functional mobility, and minimize fall risk.  Equipment: 30" slide board  Reasons for discharge: treatment goals met and discharge from hospital  Patient/family agrees with progress made and goals achieved: Yes   Skilled Intervention: Session 1: Pt received seated in power w/c, agreeable to PT session. No complaints of pain.  Reviewed therapy recommendations for transfers at home, pt reports she is considering buying a new bed with decreased height and has another car at home she can use that is lower to the ground and more feasible for slide board transfer to/from her power w/c. Provided handouts on where to purchase bedrails that will work with her adjustable bed frame at home. Pt is able to ascend/descend ramp in therapy gym independently. Per pt her ramp at home is too steep for her to safely navigate independently. Also provided handout for how to build a ramp so that pt can be more independent with leaving her home. Pt left seated in w/c setup for lunch.  Session 2: Therapist returned to work with patient and Luz Brazen, [employer] to troubleshoot issues with her current power w/c. Per Vonna Kotyk she will need to have rear wheels on chair replaced, he will bring a replacement to use tomorrow so that she can safely return home in her w/c until replacement parts come in and he can fix the issue. Pt left seated in w/c in room with needs in reach at end of session.  PT Discharge Precautions/Restrictions Precautions Precautions: Fall Precaution Comments: quadraplegic Restrictions Weight Bearing Restrictions: No Vision/Perception  Perception Perception: Within Functional Limits Praxis Praxis: Intact  Cognition Overall Cognitive Status: Within Functional Limits for tasks assessed Arousal/Alertness: Awake/alert Orientation Level: Oriented X4 Attention: Sustained Sustained Attention: Appears intact Memory: Appears intact Awareness: Appears intact Problem Solving: Appears intact Safety/Judgment: Appears intact Sensation Sensation Light Touch: Impaired Detail Peripheral sensation comments: Impaired 2/2 SCI C-6 Light Touch Impaired Details: Impaired RUE;Impaired LUE;Absent RLE;Absent LLE Hot/Cold: Impaired Detail Hot/Cold Impaired Details:  Absent RLE;Absent LLE;Impaired RUE;Impaired LUE Proprioception: Impaired  Detail Proprioception Impaired Details: Impaired RUE;Impaired LUE;Absent RLE;Absent LLE Coordination Gross Motor Movements are Fluid and Coordinated: No Fine Motor Movements are Fluid and Coordinated: No Coordination and Movement Description: 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 Motor  Motor Motor: Abnormal tone;Tetraplegia;Abnormal postural alignment and control Motor - Skilled Clinical Observations: 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 Motor - Discharge Observations: 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  Mobility Bed Mobility Bed Mobility: Rolling Right;Rolling Left;Supine to Sit;Sit to Supine Rolling Right: Minimal Assistance - Patient > 75% Rolling Left: Minimal Assistance - Patient > 75% Supine to Sit: Moderate Assistance - Patient 50-74% Sit to Supine: Moderate Assistance - Patient 50-74% Transfers Transfers: Lateral/Scoot Transfers Lateral/Scoot Transfers: Moderate Assistance - Patient 50-74% Transfer (Assistive device): Other (Comment)(slide board) Locomotion  Gait Ambulation: No Stairs / Additional Locomotion Stairs: No Architect: Yes Wheelchair Assistance: Independent with assistive device Wheelchair Propulsion: Right upper extremity;Power Wheelchair Parts Management: Needs assistance Distance: 1000  Trunk/Postural Assessment  Cervical Assessment Cervical Assessment: Exceptions to WFL(forward head) Thoracic Assessment Thoracic Assessment: Exceptions to WFL(kyphotic) Lumbar Assessment Lumbar Assessment: Exceptions to WFL(posterior pelvic tilt) Postural Control Postural Control: Deficits on evaluation Trunk Control: impaired 2/2 weak core Righting Reactions: impaired Protective Responses: impaired Postural Limitations: impaired  Balance Balance Balance Assessed: Yes Static Sitting Balance Static Sitting - Balance Support: Bilateral upper extremity supported;Feet unsupported Static Sitting - Level of Assistance: 5: Stand by  assistance Static Sitting - Comment/# of Minutes: Sitting EOM Dynamic Sitting Balance Dynamic Sitting - Balance Support: No upper extremity supported;Feet unsupported;During functional activity Dynamic Sitting - Level of Assistance: 4: Min assist;3: Mod assist Extremity Assessment  RUE Assessment RUE Assessment: Exceptions to [facility] Active Range of Motion (AROM) Comments: shoulder 0-90 General Strength Comments: 2-/5 shoulder, bicep tendodsis present and useful RUE Body System: Neuro LUE Assessment LUE Assessment: Exceptions to [facility] Passive Range of Motion (PROM) Comments: WFL Active Range of Motion (AROM) Comments: 0-90 degrees in shoulder General Strength Comments: 2-/5 shoulder, bicep tendodsis present and useful RLE Assessment RLE Assessment: Exceptions to [facility] Passive Range of Motion (PROM) Comments: PF contracture Active Range of Motion (AROM) Comments: impaired 2/2 quadraplegia General Strength Comments: 0/5 LLE Assessment LLE Assessment: Exceptions to [facility] Passive Range of Motion (PROM) Comments: PF contracture Active Range of Motion (AROM) Comments: impaired 2/2 quadraplegia General Strength Comments: 0/5     Excell Seltzer, PT, DPT 03/09/2019, 4:01 PM

## 2019-03-09 NOTE — Progress Notes (Addendum)
Occupational Therapy Session Note  Patient Details  Name: Tracey Hall MRN: [id number] Date of Birth: April 20, 1981  Today's Date: 03/09/2019 OT Individual Time: 1638-4665 OT Individual Time Calculation (min): 60 min    Short Term Goals: Week 1:  OT Short Term Goal 1 (Week 1): Pt will use bed rails with mod A for rolling for ADL tasks OT Short Term Goal 1 - Progress (Week 1): Met OT Short Term Goal 2 (Week 1): Education on leg loops for management of LEs in power chair/ bed OT Short Term Goal 2 - Progress (Week 1): Met OT Short Term Goal 3 (Week 1): Slide board transfer in ADL bed with mod A +2 for safety to secure bed OT Short Term Goal 3 - Progress (Week 1): Met  Skilled Therapeutic Interventions/Progress Updates:    1:! Participated in self care tasks at bed level with focus on bed mobility with bed rails for LB clothing management. Review of setup at home and discussion about bed and mattress. Pt able to bathe UB and LEs requiring A for periarea and buttocks and back. Pt performed slide board transfer with mod A with slide board (even transfer) with LEs supported together with gait belt. Then donned abdominal binder with chair tilted back with another person's A to provide more trunk support. Pt able to then navigate around room to perform grooming and to setup to eat.   Therapy Documentation Precautions:  Precautions Precautions: Fall Precaution Comments: quadraplegic Restrictions Weight Bearing Restrictions: No Other Position/Activity Restrictions: does not bear weight General:   Vital Signs: Therapy Vitals Temp: 97.7 F (36.5 C) Temp Source: Oral Pulse Rate: 85 Resp: 16 BP: 104/77 Oxygen Therapy SpO2: 100 % O2 Device: Room Air Pain:  no c/o pain  Therapy/Group: Individual Therapy  Willeen Cass [facility] 03/09/2019, 4:40 PM

## 2019-03-09 NOTE — Plan of Care (Signed)
  Problem: Consults Goal: RH SPINAL CORD INJURY PATIENT EDUCATION Description:  See Patient Education module for education specifics.  Outcome: Progressing   Problem: SCI BOWEL ELIMINATION Goal: RH STG MANAGE BOWEL WITH ASSISTANCE Description: STG Manage Bowel with total Assistance. Outcome: Progressing Goal: RH STG SCI MANAGE BOWEL WITH MEDICATION WITH ASSISTANCE Description: STG SCI Manage bowel with medication with min assistance. Outcome: Progressing   Problem: SCI BLADDER ELIMINATION Goal: RH STG MANAGE BLADDER WITH EQUIPMENT WITH ASSISTANCE Description: STG Manage Bladder With Equipment With total Assistance Outcome: Progressing   Problem: RH SKIN INTEGRITY Goal: RH STG SKIN FREE OF INFECTION/BREAKDOWN Description: With min assist Outcome: Progressing   Problem: RH SAFETY Goal: RH STG ADHERE TO SAFETY PRECAUTIONS W/ASSISTANCE/DEVICE Description: STG Adhere to Safety Precautions With cues/reminders Assistance/Device. Outcome: Progressing   Problem: RH PAIN MANAGEMENT Goal: RH STG PAIN MANAGED AT OR BELOW PT'S PAIN GOAL Description: At or below level 5 Outcome: Progressing   Problem: RH KNOWLEDGE DEFICIT SCI Goal: RH STG INCREASE KNOWLEDGE OF SELF CARE AFTER SCI Description: Pt will be able to direct care at discharge using handouts and educational resources with cues/reminders Outcome: Progressing   Problem: RH Pre-functional/Other (Specify) Goal: RH LTG Pre-functional (Specify) Outcome: Progressing   Problem: RH Pre-functional/Other (Specify) Goal: RH LTG Interdisciplinary (Specify) 1 Description: RH LTG Interdisciplinary (Specify)1 Outcome: Progressing

## 2019-03-10 MED ORDER — SILVER SULFADIAZINE 1 % EX CREA: TOPICAL_CREAM | Freq: Three times a day (TID) | CUTANEOUS | 0 refills | Status: DC

## 2019-03-10 MED ORDER — LIDOCAINE HCL URETHRAL/MUCOSAL 2 % EX GEL: CUTANEOUS | 1 refills | Status: DC

## 2019-03-10 NOTE — Progress Notes (Signed)
Pt. Got d/c instructions and equipment pt. Is ready to go home with her husband.

## 2019-03-10 NOTE — Progress Notes (Signed)
[place] PHYSICAL MEDICINE & REHABILITATION PROGRESS NOTE   Subjective/Complaints: Patient seen sitting up in bed this morning she is ready for discharge.  ROS: Denies CP, shortness of breath, nausea, vomiting, diarrhea.  Objective:   No results found. No results for input(s): WBC, HGB, HCT, PLT in the last 72 hours. No results for input(s): NA, K, CL, CO2, GLUCOSE, BUN, CREATININE, CALCIUM in the last 72 hours.  Intake/Output Summary (Last 24 hours) at 03/10/2019 1021 Last data filed at 03/10/2019 0800 Gross per 24 hour  Intake 360 ml  Output 1100 ml  Net -740 ml     Physical Exam: Vital Signs Blood pressure 96/62, pulse 82, temperature 98 F (36.7 C), temperature source Oral, resp. rate 18, height 5\' 6"  (1.676 m), weight 99.3 kg, SpO2 100 %. Constitutional: NAD.  Vital signs reviewed.  Morbidly obese. HENT: Normocephalic and atraumatic. Eyes: EOMI.  No discharge. Cardiovascular: No JVD. Respiratory: Normal effort.  No stridor. GI: Non-distended. GU: + Foley. Skin: Warm and dry.  Intact. Psych: Flat. Musc: Lower extremity contractures as well as hands, unchanged. Neurological: Alert and oriented. C6 motor level. Intrinsic and deformities Bilateral lower extremities: 0/5 proximal distal, unchanged Sensation absent to light touch bilateral lower extremities  Assessment/Plan: 1. Functional deficits secondary to debility from sepsis due to UTI with associated C6 quadriplegia ASIA A which require 3+ hours per day of interdisciplinary therapy in a comprehensive inpatient rehab setting.  Physiatrist is providing close team supervision and 24 hour management of active medical problems listed below.  Physiatrist and rehab team continue to assess barriers to discharge/monitor patient progress toward functional and medical goals  Care Tool:  Bathing    Body parts bathed by patient: Right arm, Left arm, Chest, Abdomen, Right upper leg, Left upper leg, Right lower leg, Left  lower leg, Face   Body parts bathed by helper: Front perineal area, Buttocks     Bathing assist Assist Level: Minimal Assistance - Patient > 75%     Upper Body Dressing/Undressing Upper body dressing   What is the patient wearing?: Bra, Pull over shirt    Upper body assist Assist Level: Moderate Assistance - Patient 50 - 74%    Lower Body Dressing/Undressing Lower body dressing      What is the patient wearing?: Pants     Lower body assist Assist for lower body dressing: Minimal Assistance - Patient > 75%     Toileting Toileting Toileting Activity did not occur (Clothing management and hygiene only): N/A (no void or bm)  Toileting assist Assist for toileting: Total Assistance - Patient < 25%(Bed level bowel program and leg bag catheter)     Transfers Chair/bed transfer  Transfers assist     Chair/bed transfer assist level: Moderate Assistance - Patient 50 - 74%     Locomotion Ambulation   Ambulation assist   Ambulation activity did not occur: N/A          Walk 10 feet activity   Assist  Walk 10 feet activity did not occur: N/A        Walk 50 feet activity   Assist Walk 50 feet with 2 turns activity did not occur: N/A         Walk 150 feet activity   Assist Walk 150 feet activity did not occur: N/A         Walk 10 feet on uneven surface  activity   Assist Walk 10 feet on uneven surfaces activity did not occur: N/A  Wheelchair     Assist Will patient use wheelchair at discharge?: Yes Type of Wheelchair: Power    Wheelchair assist level: Independent Max wheelchair distance: 150'    Wheelchair 50 feet with 2 turns activity    Assist        Assist Level: Independent   Wheelchair 150 feet activity     Assist      Assist Level: Independent   Blood pressure 96/62, pulse 82, temperature 98 F (36.7 C), temperature source Oral, resp. rate 18, height 5\' 6"  (1.676 m), weight 99.3 kg, SpO2 100  %.  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 Antithrombotics: -DVT/anticoagulation:Pharmaceutical:Lovenox -antiplatelet therapy: N/a 3.Chronic pain/Pain Management:Now on cymbalta daily with oxycodone prn. 4. Mood:LCSW to follow for evaluation and support. -antipsychotic agents: N/A 5. Neuropsych: This patientiscapable of making decisions on herown behalf. 6. Skin/Wound Care:Pressure relief measures--maintain adequate nutritonal status. Air mattress to prevent breakdown. Unna boots to BLE if bullous lesions recur  9/16- bullae L arm- started vitamin E oil daily.  7. Fluids/Electrolytes/Nutrition:Monitor I/O.  8. E coli sepsis: Completed Levaquin  9. Bilateral hydronephrosis/frequent sepsis due to UTI/neurogenic bladder:   Continue foley for now.Educated on B/B program.  9/8- Urology said have pt f/u for urodynamics  Continue Foley at discharge 10. Neurogenic bowel:Scared to have diarrhea with laxatives and has been refusing miralax--has BM "once a month".  Patient never adopted bowel program  Continue Senokot-s 2 tabs              -miralax qam  Attempting to change bowel program to nightly   9/8- will change to 2x/week per pt request; add lidocaine and change suppository to enemeeze  Some improvement  11. Anemia of chronic disease: Increase iron to daily.   Hemoglobin 9.6 on 9/14 12. Hypokalemia: Continue supplement  Potassium 3.9 on 9/14  Magnesium 1.9 on 9/3  13. Low protein stores: Added Prostat. 14. Hx of depression/anxiety:Now on cymbalta with vistaril prn for anxiety. 15. Spasticity/spasms-   Baclofen d/ced due to lethargy/weakness  Increased Zanaflex to 4 mg IUD and added Valium 2 mg BID for spasms, increased  Valium to 5 mg BID for spasms  Continue medications at discharge, will need to continue to follow-up and likely further adjustments in ambulatory setting. 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  Morbid obesity: Encouraged weight loss 19. 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

## 2019-03-10 NOTE — Progress Notes (Signed)
Social Work Discharge Note   The overall goal for the admission was met for:   Discharge location: Yes - home with familly  Length of Stay: Yes - 15 days  Discharge activity level: Yes - mod to max assist w/c level  Home/community participation: Yes  Services provided included: MD, RD, PT, OT, SLP, TR, Pharmacy, Neuropsych and SW  Financial Services: Medicare and Medicaid  Follow-up services arranged: Home Health: PT/OT/RN from [place], DME: 30" slide board from [facility], Other: Catheter and other incontinence supplies from [facility] and Patient/Family has no preference for HH/DME agencies  Comments (or additional information): Pt's significant other came in for family education and feels prepared to assist pt as needed at home.  Foley catheter insertion training has occurred, as well as therapy training.  Patient/Family verbalized understanding of follow-up arrangements: Yes  Individual responsible for coordination of the follow-up plan: pt [phone] and her significant other, Ray Church [phone]  Confirmed correct DME delivered: Trey Sailors 03/10/2019    Gayland Nicol, Silvestre Mesi

## 2019-03-10 NOTE — Discharge Instructions (Signed)
Inpatient Rehab Discharge Instructions  Tracey Hall Discharge date and time: 03/10/19   Activities/Precautions/ Functional Status: Activity: no lifting, driving, or strenuous exercise till cleared by MD Diet: regular diet Wound Care: none needed   Functional status:  ___ No restrictions     ___ Walk up steps independently _X__ 24/7 supervision/assistance   ___ Walk up steps with assistance ___ Intermittent supervision/assistance  ___ Bathe/dress independently ___ Walk with walker     _X__ Bathe/dress with assistance ___ Walk Independently    ___ Shower independently ___ Walk with assistance    ___ Shower with assistance _X__ No alcohol     ___ Return to work/school ________   COMMUNITY REFERRALS UPON DISCHARGE:   Home Health:   PT     OT     RN  Agency:  [facility] Phone:  [phone] Medical Equipment/Items Ordered:  30" transfer board  Agency/Supplier:  [facility]       Phone:  [phone] Other:  [facility] (catheter supplies)      [phone]  Special Instructions: 1. Continue bowel regimen twice a week. 2. Continue foley care twice a day. Drink plenty of fluids.    Indwelling Urinary Catheter Care, Adult An indwelling urinary catheter is a thin tube that is put into your bladder. The tube helps to drain pee (urine) out of your body. The tube goes in through your urethra. Your urethra is where pee comes out of your body. Your pee will come out through the catheter, then it will go into a bag (drainage bag). Take good care of your catheter so it will work well. How to wear your catheter and bag Supplies needed  Sticky tape (adhesive tape) or a leg strap.  Alcohol wipe or soap and water (if you use tape).  A clean towel (if you use tape).  Large overnight bag.  Smaller bag (leg bag). Wearing your catheter Attach your catheter to your leg with tape or a leg strap.  Make sure the catheter is not pulled tight.  If a leg strap gets wet, take it  off and put on a dry strap.  If you use tape to hold the bag on your leg: 1. Use an alcohol wipe or soap and water to wash your skin where the tape made it sticky before. 2. Use a clean towel to pat-dry that skin. 3. Use new tape to make the bag stay on your leg. Wearing your bags You should have been given a large overnight bag.  You may wear the overnight bag in the day or night.  Always have the overnight bag lower than your bladder.  Do not let the bag touch the floor.  Before you go to sleep, put a clean plastic bag in a wastebasket. Then hang the overnight bag inside the wastebasket. You should also have a smaller leg bag that fits under your clothes.  Always wear the leg bag below your knee.  Do not wear your leg bag at night. How to care for your skin and catheter Supplies needed  A clean washcloth.  Water and mild soap.  A clean towel. Caring for your skin and catheter      Clean the skin around your catheter every day: ? Wash your hands with soap and water. ? Wet a clean washcloth in warm water and mild soap. ? Clean the skin around your urethra. ? If you are female: ? Gently spread the folds of skin around your vagina (labia). ?  With the washcloth in your other hand, wipe the inner side of your labia on each side. Wipe from front to back. ? If you are female: ? Pull back any skin that covers the end of your penis (foreskin). ? With the washcloth in your other hand, wipe your penis in small circles. Start wiping at the tip of your penis, then move away from the catheter. ? Move the foreskin back in place, if needed. ? With your free hand, hold the catheter close to where it goes into your body. ? Keep holding the catheter during cleaning so it does not get pulled out. ? With the washcloth in your other hand, clean the catheter. ? Only wipe downward on the catheter. ? Do not wipe upward toward your body. Doing this may push germs into your urethra and cause  infection. ? Use a clean towel to pat-dry the catheter and the skin around it. Make sure to wipe off all soap. ? Wash your hands with soap and water.  Shower every day. Do not take baths.  Do not use cream, ointment, or lotion on the area where the catheter goes into your body, unless your doctor tells you to.  Do not use powders, sprays, or lotions on your genital area.  Check your skin around the catheter every day for signs of infection. Check for: ? Redness, swelling, or pain. ? Fluid or blood. ? Warmth. ? Pus or a bad smell. How to empty the bag Supplies needed  Rubbing alcohol.  Gauze pad or cotton ball.  Tape or a leg strap. Emptying the bag Pour the pee out of your bag when it is ?- full, or at least 2-3 times a day. Do this for your overnight bag and your leg bag. 1. Wash your hands with soap and water. 2. Separate (detach) the bag from your leg. 3. Hold the bag over the toilet or a clean pail. Keep the bag lower than your hips and bladder. This is so the pee (urine) does not go back into the tube. 4. Open the pour spout. It is at the bottom of the bag. 5. Empty the pee into the toilet or pail. Do not let the pour spout touch any surface. 6. Put rubbing alcohol on a gauze pad or cotton ball. 7. Use the gauze pad or cotton ball to clean the pour spout. 8. Close the pour spout. 9. Attach the bag to your leg with tape or a leg strap. 10. Wash your hands with soap and water. Follow instructions for cleaning the drainage bag:  From the product maker.  As told by your doctor. How to change the bag Supplies needed  Alcohol wipes.  A clean bag.  Tape or a leg strap. Changing the bag Replace your bag when it starts to leak, smell bad, or look dirty. 1. Wash your hands with soap and water. 2. Separate the dirty bag from your leg. 3. Pinch the catheter with your fingers so that pee does not spill out. 4. Separate the catheter tube from the bag tube where these  tubes connect (at the connection valve). Do not let the tubes touch any surface. 5. Clean the end of the catheter tube with an alcohol wipe. Use a different alcohol wipe to clean the end of the bag tube. 6. Connect the catheter tube to the tube of the clean bag. 7. Attach the clean bag to your leg with tape or a leg strap. Do not make the bag  tight on your leg. 8. Wash your hands with soap and water. General rules   Never pull on your catheter. Never try to take it out. Doing that can hurt you.  Always wash your hands before and after you touch your catheter or bag. Use a mild, fragrance-free soap. If you do not have soap and water, use hand sanitizer.  Always make sure there are no twists or bends (kinks) in the catheter tube.  Always make sure there are no leaks in the catheter or bag.  Drink enough fluid to keep your pee pale yellow.  Do not take baths, swim, or use a hot tub.  If you are female, wipe from front to back after you poop (have a bowel movement). Contact a doctor if:  Your pee is cloudy.  Your pee smells worse than usual.  Your catheter gets clogged.  Your catheter leaks.  Your bladder feels full. Get help right away if:  You have redness, swelling, or pain where the catheter goes into your body.  You have fluid, blood, pus, or a bad smell coming from the area where the catheter goes into your body.  Your skin feels warm where the catheter goes into your body.  You have a fever.  You have pain in your: ? Belly (abdomen). ? Legs. ? Lower back. ? Bladder.  You see blood in the catheter.  Your pee is pink or red.  You feel sick to your stomach (nauseous).  You throw up (vomit).  You have chills.  Your pee is not draining into the bag.  Your catheter gets pulled out. Summary  An indwelling urinary catheter is a thin tube that is placed into the bladder to help drain pee (urine) out of the body.  The catheter is placed into the part of the  body that drains pee from the bladder (urethra).  Taking good care of your catheter will keep it working properly and help prevent problems.  Always wash your hands before and after touching your catheter or bag.  Never pull on your catheter or try to take it out. This information is not intended to replace advice given to you by your health care provider. Make sure you discuss any questions you have with your health care provider. Document Released: 10/03/2012 Document Revised: 09/30/2018 Document Reviewed: 01/22/2017 Elsevier Patient Education  2020 [employer].   My questions have been answered and I understand these instructions. I will adhere to these goals and the provided educational materials after my discharge from the hospital.  Patient/Caregiver Signature _______________________________ Date __________  Clinician Signature _______________________________________ Date __________  Please bring this form and your medication list with you to all your follow-up doctor's appointments.

## 2019-03-13 ENCOUNTER — Telehealth: Payer: Self-pay | Admitting: Registered Nurse

## 2019-03-13 NOTE — Telephone Encounter (Signed)
Placed a call to [name] and her significant other Mr. Tracey Hall, no answer. Left message to return the call .

## 2019-03-14 ENCOUNTER — Inpatient Hospital Stay: Payer: Medicare Other

## 2019-03-14 NOTE — Telephone Encounter (Signed)
Transitional Care call  Patient name: Tracey Hall  DOB: [birth year] 1. Are you/is patient experiencing any problems since coming home? No a. Are there any questions regarding any aspect of care? No 2. Are there any questions regarding medications administration/dosing? No a. Are meds being taken as prescribed? All except, Enema and lidocaine jelly, she states she didn't received the above medications. This provider placed a call to Pharmacy, [name] is requiring a PA. Spoke with [name] regarding the above. The cost of the enema's will be 78.00 dollars. [name] reports she can't afford the enema's. Lidocaine jelly is requiring a PA. [name] is aware I spoke with [name] and Dr. Dagoberto Ligas, she verbalizes understanding.  b. "Patient should review meds with caller to confirm" Medication List Reviewed.  3. Have there been any falls? No 4. Has Home Health been to the house and/or have they contacted you? [facility] have called [name], she states she will return their call.  a. If not, have you tried to contact them? NA b. Can we help you contact them? No 5. Are bowels and bladder emptying properly? Voiding/ On Bowel Program. a. Are there any unexpected incontinence issues? NA b. If applicable, is patient following bowel/bladder programs? Yes, she is on a bowel program, she states her significant other performed digital stimulation. She is awaiting approval of enema and lidocaine jelly.  6. Any fevers, problems with breathing, unexpected pain? No 7. Are there any skin problems or new areas of breakdown? No 8. Has the patient/family member arranged specialty MD follow up (ie cardiology/neurology/renal/surgical/etc.)?  HF[facility] appointments are scheduled.  a. Can we help arrange? NA 9. Does the patient need any other services or support that we can help arrange? No 10. Are caregivers following through as expected in assisting the patient? Yes 11. Has the patient quit smoking, drinking  alcohol, or using drugs as recommended? [name] admits to drinking alcohol, she states she doesn't smoke, or use illicit drugs.   Appointment date/time 03/17/2019  arrival time 11:00 for 11:20 appointment with Dr. Dagoberto Ligas. At [facility]

## 2019-03-17 ENCOUNTER — Encounter
Payer: Medicare Other | Attending: Physical Medicine and Rehabilitation | Admitting: Physical Medicine and Rehabilitation

## 2019-03-21 ENCOUNTER — Encounter: Payer: Medicare Other | Admitting: Physical Medicine and Rehabilitation

## 2019-03-21 DIAGNOSIS — R3914 Feeling of incomplete bladder emptying: Secondary | ICD-10-CM | POA: Diagnosis not present

## 2019-03-22 ENCOUNTER — Encounter: Payer: Medicare Other | Admitting: Physical Medicine and Rehabilitation

## 2019-04-06 ENCOUNTER — Inpatient Hospital Stay: Payer: Medicare Other | Admitting: Family Medicine

## 2019-04-10 ENCOUNTER — Other Ambulatory Visit: Payer: Self-pay

## 2019-04-10 ENCOUNTER — Observation Stay (HOSPITAL_COMMUNITY)
Admission: EM | Admit: 2019-04-10 | Discharge: 2019-04-11 | Discharge status: Against Medical Advice | Payer: Medicare Other | Attending: Internal Medicine | Admitting: Internal Medicine

## 2019-04-10 ENCOUNTER — Encounter (HOSPITAL_COMMUNITY): Payer: Self-pay

## 2019-04-10 ENCOUNTER — Emergency Department (HOSPITAL_COMMUNITY): Payer: Medicare Other

## 2019-04-10 ENCOUNTER — Encounter: Payer: Self-pay | Admitting: Family Medicine

## 2019-04-10 DIAGNOSIS — Z79899 Other long term (current) drug therapy: Secondary | ICD-10-CM | POA: Diagnosis not present

## 2019-04-10 DIAGNOSIS — D638 Anemia in other chronic diseases classified elsewhere: Secondary | ICD-10-CM | POA: Diagnosis not present

## 2019-04-10 DIAGNOSIS — N319 Neuromuscular dysfunction of bladder, unspecified: Secondary | ICD-10-CM | POA: Diagnosis not present

## 2019-04-10 DIAGNOSIS — Z20828 Contact with and (suspected) exposure to other viral communicable diseases: Secondary | ICD-10-CM | POA: Insufficient documentation

## 2019-04-10 DIAGNOSIS — K592 Neurogenic bowel, not elsewhere classified: Secondary | ICD-10-CM | POA: Diagnosis not present

## 2019-04-10 DIAGNOSIS — N390 Urinary tract infection, site not specified: Secondary | ICD-10-CM | POA: Diagnosis not present

## 2019-04-10 DIAGNOSIS — A419 Sepsis, unspecified organism: Secondary | ICD-10-CM | POA: Diagnosis not present

## 2019-04-10 DIAGNOSIS — K279 Peptic ulcer, site unspecified, unspecified as acute or chronic, without hemorrhage or perforation: Secondary | ICD-10-CM | POA: Diagnosis present

## 2019-04-10 DIAGNOSIS — F4320 Adjustment disorder, unspecified: Secondary | ICD-10-CM | POA: Diagnosis not present

## 2019-04-10 DIAGNOSIS — D649 Anemia, unspecified: Secondary | ICD-10-CM | POA: Diagnosis not present

## 2019-04-10 DIAGNOSIS — E871 Hypo-osmolality and hyponatremia: Secondary | ICD-10-CM | POA: Insufficient documentation

## 2019-04-10 DIAGNOSIS — F329 Major depressive disorder, single episode, unspecified: Secondary | ICD-10-CM | POA: Insufficient documentation

## 2019-04-10 DIAGNOSIS — G8250 Quadriplegia, unspecified: Secondary | ICD-10-CM | POA: Insufficient documentation

## 2019-04-10 DIAGNOSIS — F419 Anxiety disorder, unspecified: Secondary | ICD-10-CM | POA: Insufficient documentation

## 2019-04-10 DIAGNOSIS — Z6834 Body mass index (BMI) 34.0-34.9, adult: Secondary | ICD-10-CM | POA: Diagnosis not present

## 2019-04-10 DIAGNOSIS — R509 Fever, unspecified: Secondary | ICD-10-CM | POA: Diagnosis not present

## 2019-04-10 DIAGNOSIS — E669 Obesity, unspecified: Secondary | ICD-10-CM | POA: Insufficient documentation

## 2019-04-10 DIAGNOSIS — G894 Chronic pain syndrome: Secondary | ICD-10-CM | POA: Diagnosis not present

## 2019-04-10 DIAGNOSIS — F339 Major depressive disorder, recurrent, unspecified: Secondary | ICD-10-CM | POA: Diagnosis present

## 2019-04-10 LAB — CBC WITH DIFFERENTIAL/PLATELET
Abs Immature Granulocytes: 0.08 10*3/uL — ABNORMAL HIGH (ref 0.00–0.07)
Basophils Absolute: 0 10*3/uL (ref 0.0–0.1)
Basophils Relative: 0 %
Eosinophils Absolute: 0 10*3/uL (ref 0.0–0.5)
Eosinophils Relative: 0 %
HCT: 32.8 % — ABNORMAL LOW (ref 36.0–46.0)
Hemoglobin: 10.4 g/dL — ABNORMAL LOW (ref 12.0–15.0)
Immature Granulocytes: 1 %
Lymphocytes Relative: 10 %
Lymphs Abs: 1.5 10*3/uL (ref 0.7–4.0)
MCH: 26.7 pg (ref 26.0–34.0)
MCHC: 31.7 g/dL (ref 30.0–36.0)
MCV: 84.3 fL (ref 80.0–100.0)
Monocytes Absolute: 1 10*3/uL (ref 0.1–1.0)
Monocytes Relative: 6 %
Neutro Abs: 13.2 10*3/uL — ABNORMAL HIGH (ref 1.7–7.7)
Neutrophils Relative %: 83 %
Platelets: 331 10*3/uL (ref 150–400)
RBC: 3.89 MIL/uL (ref 3.87–5.11)
RDW: 23.3 % — ABNORMAL HIGH (ref 11.5–15.5)
WBC: 15.8 10*3/uL — ABNORMAL HIGH (ref 4.0–10.5)
nRBC: 0 % (ref 0.0–0.2)

## 2019-04-10 LAB — COMPREHENSIVE METABOLIC PANEL
ALT: 23 U/L (ref 0–44)
AST: 36 U/L (ref 15–41)
Albumin: 3.1 g/dL — ABNORMAL LOW (ref 3.5–5.0)
Alkaline Phosphatase: 70 U/L (ref 38–126)
Anion gap: 10 (ref 5–15)
BUN: 10 mg/dL (ref 6–20)
CO2: 20 mmol/L — ABNORMAL LOW (ref 22–32)
Calcium: 8.2 mg/dL — ABNORMAL LOW (ref 8.9–10.3)
Chloride: 102 mmol/L (ref 98–111)
Creatinine, Ser: 0.64 mg/dL (ref 0.44–1.00)
GFR calc Af Amer: >60 mL/min (ref >60)
GFR calc non Af Amer: >60 mL/min (ref >60)
Glucose, Bld: 102 mg/dL — ABNORMAL HIGH (ref 70–99)
Potassium: 3.5 mmol/L (ref 3.5–5.1)
Sodium: 132 mmol/L — ABNORMAL LOW (ref 135–145)
Total Bilirubin: 0.7 mg/dL (ref 0.3–1.2)
Total Protein: 6.8 g/dL (ref 6.5–8.1)

## 2019-04-10 LAB — URINALYSIS, ROUTINE W REFLEX MICROSCOPIC
Bilirubin Urine: NEGATIVE
Glucose, UA: NEGATIVE mg/dL
Hgb urine dipstick: NEGATIVE
Ketones, ur: 20 mg/dL — AB
Nitrite: POSITIVE — AB
Protein, ur: 30 mg/dL — AB
Specific Gravity, Urine: 1.021 (ref 1.005–1.030)
WBC, UA: >50 WBC/hpf — ABNORMAL HIGH (ref 0–5)
pH: 5 (ref 5.0–8.0)

## 2019-04-10 LAB — I-STAT BETA HCG BLOOD, ED (NOT ORDERABLE): I-stat hCG, quantitative: <5 m[IU]/mL (ref <5)

## 2019-04-10 LAB — LACTIC ACID, PLASMA: Lactic Acid, Venous: 1.3 mmol/L (ref 0.5–1.9)

## 2019-04-10 MED ORDER — KETOROLAC TROMETHAMINE 30 MG/ML IJ SOLN
15.0000 mg | Freq: Once | INTRAMUSCULAR | Status: AC
  Administered 2019-04-11: 15 mg via INTRAVENOUS
  Filled 2019-04-10: qty 1

## 2019-04-10 MED ORDER — MORPHINE SULFATE (PF) 4 MG/ML IV SOLN
4.0000 mg | Freq: Once | INTRAVENOUS | Status: AC
  Administered 2019-04-11: 4 mg via INTRAVENOUS
  Filled 2019-04-10: qty 1

## 2019-04-10 MED ORDER — ACETAMINOPHEN 325 MG PO TABS
650.0000 mg | ORAL_TABLET | Freq: Once | ORAL | Status: AC
  Administered 2019-04-10: 650 mg via ORAL
  Filled 2019-04-10: qty 2

## 2019-04-10 MED ORDER — SODIUM CHLORIDE 0.9 % IV BOLUS
1000.0000 mL | Freq: Once | INTRAVENOUS | Status: AC
  Administered 2019-04-11: 1000 mL via INTRAVENOUS

## 2019-04-10 MED ORDER — ONDANSETRON HCL 4 MG/2ML IJ SOLN
4.0000 mg | Freq: Once | INTRAMUSCULAR | Status: AC
  Administered 2019-04-11: 4 mg via INTRAVENOUS
  Filled 2019-04-10: qty 2

## 2019-04-10 MED ORDER — SODIUM CHLORIDE 0.9% FLUSH: 3.0000 mL | Freq: Once | INTRAVENOUS | Status: DC

## 2019-04-10 MED ORDER — SODIUM CHLORIDE 0.9 % IV SOLN
1.0000 g | Freq: Once | INTRAVENOUS | Status: AC
  Administered 2019-04-11: 01:00:00 1 g via INTRAVENOUS
  Filled 2019-04-10: qty 10

## 2019-04-10 MED ORDER — ACETAMINOPHEN 325 MG PO TABS: 650.0000 mg | ORAL_TABLET | Freq: Once | ORAL | Status: DC | PRN

## 2019-04-10 MED ORDER — HYDROMORPHONE HCL 1 MG/ML IJ SOLN
1.0000 mg | Freq: Once | INTRAMUSCULAR | Status: AC
  Administered 2019-04-11: 1 mg via INTRAMUSCULAR
  Filled 2019-04-10: qty 1

## 2019-04-10 NOTE — ED Notes (Signed)
Pt refusing to have COVID test performed until her pain is until control better. Charge RN asked to start IV via ultrasound and is attempting at this time.
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
## 2019-04-10 NOTE — ED Triage Notes (Addendum)
Patient states she had a catheter placed in September. Patient states she had her foley cath changed on 04/09/19. Patient states she had blood in the foley bag.  Patient states she has been having a fever, headache, and bladder pain x 2 days.

## 2019-04-10 NOTE — Telephone Encounter (Signed)
Message to pharmacy instructs patient to call Dr. Florentina Jenny office to make an appointment

## 2019-04-10 NOTE — ED Notes (Signed)
Patient transported to X-ray 

## 2019-04-11 ENCOUNTER — Encounter (HOSPITAL_COMMUNITY): Payer: Self-pay | Admitting: Family Medicine

## 2019-04-11 DIAGNOSIS — G8250 Quadriplegia, unspecified: Secondary | ICD-10-CM | POA: Diagnosis not present

## 2019-04-11 DIAGNOSIS — N390 Urinary tract infection, site not specified: Secondary | ICD-10-CM | POA: Diagnosis not present

## 2019-04-11 DIAGNOSIS — A419 Sepsis, unspecified organism: Secondary | ICD-10-CM | POA: Diagnosis not present

## 2019-04-11 DIAGNOSIS — F339 Major depressive disorder, recurrent, unspecified: Secondary | ICD-10-CM

## 2019-04-11 LAB — CBC WITH DIFFERENTIAL/PLATELET
Abs Immature Granulocytes: 0.08 10*3/uL — ABNORMAL HIGH (ref 0.00–0.07)
Basophils Absolute: 0 10*3/uL (ref 0.0–0.1)
Basophils Relative: 0 %
Eosinophils Absolute: 0 10*3/uL (ref 0.0–0.5)
Eosinophils Relative: 0 %
HCT: 30.1 % — ABNORMAL LOW (ref 36.0–46.0)
Hemoglobin: 9.3 g/dL — ABNORMAL LOW (ref 12.0–15.0)
Immature Granulocytes: 1 %
Lymphocytes Relative: 11 %
Lymphs Abs: 1.6 10*3/uL (ref 0.7–4.0)
MCH: 26.5 pg (ref 26.0–34.0)
MCHC: 30.9 g/dL (ref 30.0–36.0)
MCV: 85.8 fL (ref 80.0–100.0)
Monocytes Absolute: 1 10*3/uL (ref 0.1–1.0)
Monocytes Relative: 6 %
Neutro Abs: 12.8 10*3/uL — ABNORMAL HIGH (ref 1.7–7.7)
Neutrophils Relative %: 82 %
Platelets: 276 10*3/uL (ref 150–400)
RBC: 3.51 MIL/uL — ABNORMAL LOW (ref 3.87–5.11)
RDW: 23.4 % — ABNORMAL HIGH (ref 11.5–15.5)
WBC: 15.5 10*3/uL — ABNORMAL HIGH (ref 4.0–10.5)
nRBC: 0 % (ref 0.0–0.2)

## 2019-04-11 LAB — BASIC METABOLIC PANEL
Anion gap: 8 (ref 5–15)
BUN: 9 mg/dL (ref 6–20)
CO2: 22 mmol/L (ref 22–32)
Calcium: 7.6 mg/dL — ABNORMAL LOW (ref 8.9–10.3)
Chloride: 105 mmol/L (ref 98–111)
Creatinine, Ser: 0.73 mg/dL (ref 0.44–1.00)
GFR calc Af Amer: >60 mL/min (ref >60)
GFR calc non Af Amer: >60 mL/min (ref >60)
Glucose, Bld: 113 mg/dL — ABNORMAL HIGH (ref 70–99)
Potassium: 3.3 mmol/L — ABNORMAL LOW (ref 3.5–5.1)
Sodium: 135 mmol/L (ref 135–145)

## 2019-04-11 LAB — SARS CORONAVIRUS 2 (TAT 6-24 HRS): SARS Coronavirus 2: NEGATIVE

## 2019-04-11 LAB — LACTIC ACID, PLASMA
Lactic Acid, Venous: 2.5 mmol/L (ref 0.5–1.9)
Lactic Acid, Venous: 1.1 mmol/L (ref 0.5–1.9)

## 2019-04-11 LAB — PROTIME-INR
INR: 1.2 (ref 0.8–1.2)
Prothrombin Time: 15.3 seconds — ABNORMAL HIGH (ref 11.4–15.2)

## 2019-04-11 MED ORDER — DULOXETINE HCL 30 MG PO CPEP
30.0000 mg | ORAL_CAPSULE | Freq: Every day | ORAL | Status: DC
  Administered 2019-04-11: 30 mg via ORAL
  Filled 2019-04-11: qty 1

## 2019-04-11 MED ORDER — LORAZEPAM 2 MG/ML IJ SOLN
1.0000 mg | Freq: Once | INTRAMUSCULAR | Status: AC | PRN
  Administered 2019-04-11: 1 mg via INTRAVENOUS

## 2019-04-11 MED ORDER — SODIUM CHLORIDE 0.9 % IV SOLN
INTRAVENOUS | Status: DC | PRN
  Administered 2019-04-11: 12:00:00 500 mL via INTRAVENOUS

## 2019-04-11 MED ORDER — GABAPENTIN 300 MG PO CAPS: 300.0000 mg | ORAL_CAPSULE | Freq: Three times a day (TID) | ORAL | Status: DC

## 2019-04-11 MED ORDER — BISACODYL 10 MG RE SUPP: 10.0000 mg | Freq: Every day | RECTAL | Status: DC | PRN

## 2019-04-11 MED ORDER — POTASSIUM CHLORIDE CRYS ER 20 MEQ PO TBCR
40.0000 meq | EXTENDED_RELEASE_TABLET | Freq: Once | ORAL | Status: AC
  Administered 2019-04-11: 40 meq via ORAL
  Filled 2019-04-11: qty 2

## 2019-04-11 MED ORDER — POLYETHYLENE GLYCOL 3350 17 G PO PACK: 17.0000 g | PACK | Freq: Every day | ORAL | Status: DC | PRN

## 2019-04-11 MED ORDER — SODIUM CHLORIDE 0.9 % IV SOLN
2.0000 g | Freq: Once | INTRAVENOUS | Status: AC
  Administered 2019-04-11: 2 g via INTRAVENOUS
  Filled 2019-04-11: qty 2

## 2019-04-11 MED ORDER — HYDROCODONE-ACETAMINOPHEN 5-325 MG PO TABS
1.0000 | ORAL_TABLET | ORAL | Status: DC | PRN
  Administered 2019-04-11 (×2): 2 via ORAL
  Filled 2019-04-11 (×2): qty 2

## 2019-04-11 MED ORDER — POTASSIUM CHLORIDE IN NACL 20-0.9 MEQ/L-% IV SOLN
INTRAVENOUS | Status: AC
  Administered 2019-04-11: 05:00:00 via INTRAVENOUS
  Filled 2019-04-11: qty 1000

## 2019-04-11 MED ORDER — ACETAMINOPHEN 325 MG PO TABS
650.0000 mg | ORAL_TABLET | Freq: Four times a day (QID) | ORAL | Status: DC | PRN
  Administered 2019-04-11: 650 mg via ORAL
  Filled 2019-04-11: qty 2

## 2019-04-11 MED ORDER — CHLORHEXIDINE GLUCONATE CLOTH 2 % EX PADS
6.0000 | MEDICATED_PAD | Freq: Every day | CUTANEOUS | Status: DC
  Administered 2019-04-11: 6 via TOPICAL

## 2019-04-11 MED ORDER — ENOXAPARIN SODIUM 40 MG/0.4ML ~~LOC~~ SOLN
40.0000 mg | SUBCUTANEOUS | Status: DC
  Administered 2019-04-11: 40 mg via SUBCUTANEOUS
  Filled 2019-04-11: qty 0.4

## 2019-04-11 MED ORDER — PROMETHAZINE HCL 25 MG PO TABS: 12.5000 mg | ORAL_TABLET | Freq: Four times a day (QID) | ORAL | Status: DC | PRN

## 2019-04-11 MED ORDER — SODIUM CHLORIDE 0.9 % IV SOLN
2.0000 g | Freq: Two times a day (BID) | INTRAVENOUS | Status: DC
  Administered 2019-04-11: 2 g via INTRAVENOUS
  Filled 2019-04-11 (×2): qty 2

## 2019-04-11 MED ORDER — SODIUM CHLORIDE 0.9 % IV BOLUS
500.0000 mL | Freq: Once | INTRAVENOUS | Status: AC
  Administered 2019-04-11: 03:00:00 500 mL via INTRAVENOUS

## 2019-04-11 MED ORDER — DIAZEPAM 5 MG PO TABS
5.0000 mg | ORAL_TABLET | Freq: Two times a day (BID) | ORAL | Status: DC | PRN
  Administered 2019-04-11: 5 mg via ORAL
  Filled 2019-04-11: qty 1

## 2019-04-11 MED ORDER — SENNA 8.6 MG PO TABS
2.0000 | ORAL_TABLET | Freq: Two times a day (BID) | ORAL | Status: DC
  Administered 2019-04-11 (×2): 17.2 mg via ORAL
  Filled 2019-04-11 (×2): qty 2

## 2019-04-11 MED ORDER — OXYCODONE HCL 5 MG PO TABS: 10.0000 mg | ORAL_TABLET | Freq: Four times a day (QID) | ORAL | Status: DC | PRN

## 2019-04-11 MED ORDER — HYDROMORPHONE HCL 1 MG/ML IJ SOLN
1.0000 mg | INTRAMUSCULAR | Status: DC | PRN
  Administered 2019-04-11: 1 mg via INTRAVENOUS
  Filled 2019-04-11: qty 1

## 2019-04-11 MED ORDER — POTASSIUM CHLORIDE CRYS ER 20 MEQ PO TBCR
40.0000 meq | EXTENDED_RELEASE_TABLET | ORAL | Status: DC
  Filled 2019-04-11: qty 2

## 2019-04-11 MED ORDER — ONDANSETRON HCL 4 MG/2ML IJ SOLN: 4.0000 mg | Freq: Four times a day (QID) | INTRAMUSCULAR | Status: DC | PRN

## 2019-04-11 MED ORDER — TRAZODONE HCL 50 MG PO TABS: 50.0000 mg | ORAL_TABLET | Freq: Every evening | ORAL | Status: DC | PRN

## 2019-04-11 MED ORDER — LORAZEPAM 2 MG/ML IJ SOLN
INTRAMUSCULAR | Status: AC
  Filled 2019-04-11: qty 1

## 2019-04-11 MED ORDER — PANTOPRAZOLE SODIUM 40 MG PO TBEC
40.0000 mg | DELAYED_RELEASE_TABLET | Freq: Every day | ORAL | Status: DC
  Administered 2019-04-11: 40 mg via ORAL
  Filled 2019-04-11: qty 1

## 2019-04-11 MED ORDER — TIZANIDINE HCL 4 MG PO TABS
4.0000 mg | ORAL_TABLET | Freq: Two times a day (BID) | ORAL | Status: DC
  Administered 2019-04-11: 4 mg via ORAL
  Filled 2019-04-11: qty 1
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
## 2019-04-11 NOTE — Progress Notes (Signed)
Patient arrived to the unit requesting change in pain medication regimen. Reporting nurse had already notified MD about patient request. MD returned call and will continue on low dose oxycodone but will not be adding any other pain medications. Patient has been updated and made aware. Patient states that she needs pain meds or she will have to go home. MD to be notified.

## 2019-04-11 NOTE — ED Notes (Signed)
Charge RN attempted to get ultrasound PIV x3 unsuccessful. Some labwork was obtained, however unable to get blue and gray top. Spoke with ER provider and order given for IM analgesic d/t pt's severe pain at this time.

## 2019-04-11 NOTE — ED Notes (Addendum)
Date and time results received: 04/11/19 0141 (use smartphrase ".now" to insert current time)  Test:Lactic Acid Critical Value: 2.5  Name of Provider Notified: opyd md  Orders Received? Or Actions Taken?: waiting on orders

## 2019-04-11 NOTE — Progress Notes (Signed)
Pharmacy Antibiotic Note  Tracey Hall is a 38 y.o. female admitted on 04/10/2019 with UTI.  Pharmacy has been consulted for Cefepime dosing.  Plan: Cefepime 2gm iv q12hr  Height: 5\' 6"  (167.6 cm) Weight: 215 lb (97.5 kg) IBW/kg (Calculated) : 59.3  Temp (24hrs), Avg:101.9 F (38.8 C), Min:100.3 F (37.9 C), Max:102.9 F (39.4 C)  Recent Labs  Lab 04/10/19 1940 04/11/19 0030  WBC 15.8*  --   CREATININE 0.64  --   LATICACIDVEN 1.3 2.5*    Estimated Creatinine Clearance: 113.4 mL/min (by C-G formula based on SCr of 0.64 mg/dL).    Allergies  Allergen Reactions  . Cortisone Rash    Was told never to use cortisone again- "had a severe rash" with it  . Latex Rash    Antimicrobials this admission: Cefepime 04/11/2019 >>   Dose adjustments this admission: -  Microbiology results: -  Thank you for allowing pharmacy to be a part of this patient's care.  Nani Skillern Crowford 04/11/2019 2:15 AM

## 2019-04-11 NOTE — Progress Notes (Signed)
Patient made aware of MD recommendation to remain inpatient at the hospital. Patient is requesting to see Dr. Florene Glen. This Probation officer has informed patient that the nurse is not allowed to call a physician that is not on call or caring for the patient only doctors are allowed to consult other doctors. Patient states that she wants to leave and that she wants her paperwork. This Probation officer has informed patient that the MD does not provide paperwork when the patient leaves against medical advice because this is not his recommendation. Patient refusing to sign ama papers. Patient is requesting to see management at this time. Management has been made aware.

## 2019-04-11 NOTE — Progress Notes (Addendum)
MD paged and made aware of patients request for more pain medication, a new physician and a script for oral pain medications. MD also made aware of patients admission of taking  "PM's" from her own medication supply. MD is not recommending any additional pain meds, a new physican or a script for oral antibiotics because his current recommendation is for patient to remain in the hospital. Patient to be updated.
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
## 2019-04-13 LAB — URINE CULTURE: Culture: >=100000 — AB

## 2019-04-14 ENCOUNTER — Emergency Department (HOSPITAL_COMMUNITY)
Admission: EM | Admit: 2019-04-14 | Discharge: 2019-04-14 | Disposition: A | Discharge status: Home / Self Care | Payer: Medicare Other | Attending: Emergency Medicine | Admitting: Emergency Medicine

## 2019-04-14 ENCOUNTER — Emergency Department (HOSPITAL_COMMUNITY): Payer: Medicare Other

## 2019-04-14 ENCOUNTER — Other Ambulatory Visit: Payer: Self-pay

## 2019-04-14 DIAGNOSIS — N3001 Acute cystitis with hematuria: Secondary | ICD-10-CM | POA: Insufficient documentation

## 2019-04-14 DIAGNOSIS — Z743 Need for continuous supervision: Secondary | ICD-10-CM | POA: Diagnosis not present

## 2019-04-14 DIAGNOSIS — R509 Fever, unspecified: Secondary | ICD-10-CM | POA: Diagnosis not present

## 2019-04-14 DIAGNOSIS — R110 Nausea: Secondary | ICD-10-CM | POA: Diagnosis not present

## 2019-04-14 DIAGNOSIS — Z79899 Other long term (current) drug therapy: Secondary | ICD-10-CM | POA: Insufficient documentation

## 2019-04-14 DIAGNOSIS — R52 Pain, unspecified: Secondary | ICD-10-CM | POA: Diagnosis not present

## 2019-04-14 DIAGNOSIS — R109 Unspecified abdominal pain: Secondary | ICD-10-CM | POA: Diagnosis not present

## 2019-04-14 DIAGNOSIS — R279 Unspecified lack of coordination: Secondary | ICD-10-CM | POA: Diagnosis not present

## 2019-04-14 DIAGNOSIS — R42 Dizziness and giddiness: Secondary | ICD-10-CM | POA: Diagnosis not present

## 2019-04-14 DIAGNOSIS — R319 Hematuria, unspecified: Secondary | ICD-10-CM | POA: Diagnosis present

## 2019-04-14 LAB — COMPREHENSIVE METABOLIC PANEL
ALT: 31 U/L (ref 0–44)
AST: 31 U/L (ref 15–41)
Albumin: 2.8 g/dL — ABNORMAL LOW (ref 3.5–5.0)
Alkaline Phosphatase: 94 U/L (ref 38–126)
Anion gap: 12 (ref 5–15)
BUN: 9 mg/dL (ref 6–20)
CO2: 22 mmol/L (ref 22–32)
Calcium: 8.4 mg/dL — ABNORMAL LOW (ref 8.9–10.3)
Chloride: 104 mmol/L (ref 98–111)
Creatinine, Ser: 0.45 mg/dL (ref 0.44–1.00)
GFR calc Af Amer: >60 mL/min (ref >60)
GFR calc non Af Amer: >60 mL/min (ref >60)
Glucose, Bld: 101 mg/dL — ABNORMAL HIGH (ref 70–99)
Potassium: 4.1 mmol/L (ref 3.5–5.1)
Sodium: 138 mmol/L (ref 135–145)
Total Bilirubin: 0.3 mg/dL (ref 0.3–1.2)
Total Protein: 6.6 g/dL (ref 6.5–8.1)

## 2019-04-14 LAB — CBC WITH DIFFERENTIAL/PLATELET
Abs Immature Granulocytes: 0 10*3/uL (ref 0.00–0.07)
Basophils Absolute: 0 10*3/uL (ref 0.0–0.1)
Basophils Relative: 0 %
Eosinophils Absolute: 0.2 10*3/uL (ref 0.0–0.5)
Eosinophils Relative: 3 %
HCT: 36.4 % (ref 36.0–46.0)
Hemoglobin: 11.1 g/dL — ABNORMAL LOW (ref 12.0–15.0)
Lymphocytes Relative: 58 %
Lymphs Abs: 2.9 10*3/uL (ref 0.7–4.0)
MCH: 26.7 pg (ref 26.0–34.0)
MCHC: 30.5 g/dL (ref 30.0–36.0)
MCV: 87.5 fL (ref 80.0–100.0)
Monocytes Absolute: 0.3 10*3/uL (ref 0.1–1.0)
Monocytes Relative: 6 %
Neutro Abs: 1.7 10*3/uL (ref 1.7–7.7)
Neutrophils Relative %: 33 %
Platelets: 365 10*3/uL (ref 150–400)
RBC: 4.16 MIL/uL (ref 3.87–5.11)
RDW: 24 % — ABNORMAL HIGH (ref 11.5–15.5)
WBC: 5 10*3/uL (ref 4.0–10.5)
nRBC: 0 % (ref 0.0–0.2)
nRBC: 0 /100 WBC

## 2019-04-14 LAB — PROTIME-INR
INR: 0.9 (ref 0.8–1.2)
Prothrombin Time: 12.4 seconds (ref 11.4–15.2)

## 2019-04-14 LAB — URINALYSIS, ROUTINE W REFLEX MICROSCOPIC
Bilirubin Urine: NEGATIVE
Glucose, UA: NEGATIVE mg/dL
Hgb urine dipstick: NEGATIVE
Ketones, ur: NEGATIVE mg/dL
Nitrite: NEGATIVE
Protein, ur: NEGATIVE mg/dL
Specific Gravity, Urine: 1.023 (ref 1.005–1.030)
pH: 6 (ref 5.0–8.0)

## 2019-04-14 LAB — APTT: aPTT: 25 seconds (ref 24–36)

## 2019-04-14 LAB — LACTIC ACID, PLASMA
Lactic Acid, Venous: 1.7 mmol/L (ref 0.5–1.9)
Lactic Acid, Venous: 1 mmol/L (ref 0.5–1.9)

## 2019-04-14 LAB — I-STAT BETA HCG BLOOD, ED (MC, WL, AP ONLY): I-stat hCG, quantitative: <5 m[IU]/mL (ref <5)

## 2019-04-14 MED ORDER — TRAMADOL HCL 50 MG PO TABS: 50.0000 mg | ORAL_TABLET | Freq: Four times a day (QID) | ORAL | 0 refills | Status: DC | PRN

## 2019-04-14 MED ORDER — SODIUM CHLORIDE 0.9 % IV BOLUS
1000.0000 mL | Freq: Once | INTRAVENOUS | Status: AC
  Administered 2019-04-14: 1000 mL via INTRAVENOUS

## 2019-04-14 MED ORDER — ONDANSETRON HCL 4 MG/2ML IJ SOLN
4.0000 mg | Freq: Once | INTRAMUSCULAR | Status: AC
  Administered 2019-04-14: 4 mg via INTRAVENOUS
  Filled 2019-04-14: qty 2

## 2019-04-14 MED ORDER — CEPHALEXIN 500 MG PO CAPS: 1000.0000 mg | ORAL_CAPSULE | Freq: Two times a day (BID) | ORAL | 0 refills | Status: DC

## 2019-04-14 MED ORDER — SODIUM CHLORIDE 0.9 % IV SOLN
2.0000 g | Freq: Once | INTRAVENOUS | Status: AC
  Administered 2019-04-14: 2 g via INTRAVENOUS
  Filled 2019-04-14: qty 20

## 2019-04-14 MED ORDER — MORPHINE SULFATE (PF) 4 MG/ML IV SOLN
4.0000 mg | Freq: Once | INTRAVENOUS | Status: AC
  Administered 2019-04-14: 4 mg via INTRAVENOUS
  Filled 2019-04-14: qty 1
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
## 2019-04-14 NOTE — ED Provider Notes (Signed)
Ultrasound ED Peripheral IV (Provider)  Date/Time: 04/14/2019 4:03 PM Performed by: Maudie Flakes, MD Authorized by: Maudie Flakes, MD   Procedure details:    Indications: multiple failed IV attempts     Skin Prep: chlorhexidine gluconate     Location: Left basilic.   Angiocath:  20 G   Bedside Ultrasound Guided: Yes     Patient tolerated procedure without complications: Yes     Dressing applied: Yes        Maudie Flakes, MD 04/14/19 [phone]

## 2019-04-14 NOTE — Progress Notes (Signed)
[facility] consulted for PIV placement. Upon arrival to room, Sedonia Small, MD entering to place IV. ER nurse stated she would re-enter consult if needed.

## 2019-04-14 NOTE — Discharge Instructions (Addendum)
Return here as needed. Follow up with your doctor for a recheck. °

## 2019-04-15 LAB — CULTURE, BLOOD (ROUTINE X 2)
Culture: NO GROWTH
Special Requests: ADEQUATE

## 2019-04-16 LAB — URINE CULTURE: Culture: 30000 — AB

## 2019-04-16 LAB — CULTURE, BLOOD (ROUTINE X 2)
Culture: NO GROWTH
Special Requests: ADEQUATE

## 2019-04-17 ENCOUNTER — Telehealth: Payer: Self-pay | Admitting: *Deleted

## 2019-04-17 NOTE — Telephone Encounter (Signed)
Post ED Visit - Positive Culture Follow-up  Culture report reviewed by antimicrobial stewardship pharmacist: [place] Team []  Nathan Batchelder, Pharm.D. []  [address], Pharm.D., BCPS AQ-ID []  Heide Guile, Pharm.D., BCPS []  Parks Neptune, Pharm.D., BCPS []  [place], [place].D., BCPS, AAHIVP []  [place], Pharm.D., BCPS, AAHIVP [x]  Legrand Como, PharmD, BCPS []  Salome Arnt, PharmD, BCPS []  Johnnette Gourd, PharmD, BCPS []  Hughes Better, PharmD []  Leeroy Cha, PharmD, BCPS []  Laqueta Linden, PharmD  [place] Team []  [address], PharmD []  Leodis Sias, PharmD []  Lindell Spar, PharmD []  Royetta Asal, Rph []  Graylin Shiver) Rema Fendt, PharmD []  Glennon Mac, PharmD []  Arlyn Dunning, PharmD []  Netta Cedars, PharmD []  Dia Sitter, PharmD []  Leone Haven, PharmD []  Gretta Arab, PharmD []  Theodis Shove, PharmD []  Peggyann Juba, PharmD   Positive urine culture Treated with Cephalexin, organism sensitive to the same and no further patient follow-up is required at this time.  Reuel Boom [facility] 04/17/2019, 10:49 AM

## 2019-04-19 LAB — CULTURE, BLOOD (ROUTINE X 2)
Culture: NO GROWTH
Culture: NO GROWTH
Special Requests: ADEQUATE

## 2019-04-24 ENCOUNTER — Telehealth: Payer: Self-pay

## 2019-04-24 NOTE — Telephone Encounter (Signed)
Patient called stating she is out of all the medication she was discharged with. She no showed and cancelled appts with you due to issues in her life. Willing to come in now. Appt with new PCP on 05/26/2019 and they will not refill any medications now.

## 2019-04-25 MED ORDER — LIDOCAINE HCL URETHRAL/MUCOSAL 2 % EX GEL: CUTANEOUS | 1 refills | Status: DC

## 2019-04-25 MED ORDER — TRAMADOL HCL 50 MG PO TABS: 50.0000 mg | ORAL_TABLET | Freq: Four times a day (QID) | ORAL | 1 refills | Status: DC | PRN

## 2019-04-25 MED ORDER — TRAZODONE HCL 50 MG PO TABS: 50.0000 mg | ORAL_TABLET | Freq: Every evening | ORAL | 1 refills | Status: DC | PRN

## 2019-04-25 MED ORDER — DIAZEPAM 5 MG PO TABS: 5.0000 mg | ORAL_TABLET | Freq: Two times a day (BID) | ORAL | 1 refills | Status: DC

## 2019-04-25 MED ORDER — TIZANIDINE HCL 4 MG PO TABS: 4.0000 mg | ORAL_TABLET | Freq: Two times a day (BID) | ORAL | 1 refills | Status: DC

## 2019-04-25 MED ORDER — DULOXETINE HCL 30 MG PO CPEP: 30.0000 mg | ORAL_CAPSULE | Freq: Every day | ORAL | 1 refills | Status: DC

## 2019-04-25 MED ORDER — POTASSIUM CHLORIDE ER 10 MEQ PO TBCR: EXTENDED_RELEASE_TABLET | ORAL | 2 refills | Status: DC

## 2019-04-25 NOTE — Telephone Encounter (Signed)
Pt has been notified and scheduling an appt.

## 2019-04-25 NOTE — Telephone Encounter (Signed)
Will prescribe her some meds- all the meds we prescribed, however this is the last time, if she isn't seen in the next 2 months.  Sent all to the pharmacy

## 2019-05-12 ENCOUNTER — Other Ambulatory Visit: Payer: Self-pay

## 2019-05-12 ENCOUNTER — Encounter: Payer: Self-pay | Admitting: Physical Medicine and Rehabilitation

## 2019-05-12 ENCOUNTER — Encounter
Payer: Medicare Other | Attending: Physical Medicine and Rehabilitation | Admitting: Physical Medicine and Rehabilitation

## 2019-05-12 VITALS — BP 113/63 | HR 83 | Temp 97.0°F

## 2019-05-12 DIAGNOSIS — K592 Neurogenic bowel, not elsewhere classified: Secondary | ICD-10-CM | POA: Diagnosis not present

## 2019-05-12 DIAGNOSIS — N390 Urinary tract infection, site not specified: Secondary | ICD-10-CM | POA: Diagnosis not present

## 2019-05-12 DIAGNOSIS — Z79899 Other long term (current) drug therapy: Secondary | ICD-10-CM | POA: Diagnosis not present

## 2019-05-12 DIAGNOSIS — N319 Neuromuscular dysfunction of bladder, unspecified: Secondary | ICD-10-CM | POA: Diagnosis not present

## 2019-05-12 DIAGNOSIS — G894 Chronic pain syndrome: Secondary | ICD-10-CM

## 2019-05-12 DIAGNOSIS — G8250 Quadriplegia, unspecified: Secondary | ICD-10-CM | POA: Insufficient documentation

## 2019-05-12 DIAGNOSIS — F339 Major depressive disorder, recurrent, unspecified: Secondary | ICD-10-CM | POA: Diagnosis not present

## 2019-05-12 DIAGNOSIS — R5381 Other malaise: Secondary | ICD-10-CM | POA: Diagnosis not present

## 2019-05-12 DIAGNOSIS — F329 Major depressive disorder, single episode, unspecified: Secondary | ICD-10-CM | POA: Diagnosis not present

## 2019-05-12 DIAGNOSIS — R252 Cramp and spasm: Secondary | ICD-10-CM | POA: Insufficient documentation

## 2019-05-12 MED ORDER — FLUCONAZOLE 100 MG PO TABS: 100.0000 mg | ORAL_TABLET | Freq: Every day | ORAL | 0 refills | Status: AC

## 2019-05-12 MED ORDER — DULOXETINE HCL 60 MG PO CPEP: 60.0000 mg | ORAL_CAPSULE | Freq: Every day | ORAL | 5 refills | Status: DC
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
## 2019-05-26 ENCOUNTER — Other Ambulatory Visit: Payer: Self-pay

## 2019-05-26 ENCOUNTER — Encounter: Payer: Self-pay | Admitting: Family Medicine

## 2019-05-26 ENCOUNTER — Ambulatory Visit: Payer: Medicare Other | Attending: Family Medicine | Admitting: Family Medicine

## 2019-05-26 DIAGNOSIS — N390 Urinary tract infection, site not specified: Secondary | ICD-10-CM

## 2019-05-26 DIAGNOSIS — N319 Neuromuscular dysfunction of bladder, unspecified: Secondary | ICD-10-CM | POA: Diagnosis not present

## 2019-05-26 DIAGNOSIS — L139 Bullous disorder, unspecified: Secondary | ICD-10-CM | POA: Diagnosis not present

## 2019-05-26 DIAGNOSIS — G8254 Quadriplegia, C5-C7 incomplete: Secondary | ICD-10-CM | POA: Diagnosis not present

## 2019-05-26 DIAGNOSIS — G8250 Quadriplegia, unspecified: Secondary | ICD-10-CM

## 2019-05-26 DIAGNOSIS — Z87440 Personal history of urinary (tract) infections: Secondary | ICD-10-CM

## 2019-05-26 DIAGNOSIS — N946 Dysmenorrhea, unspecified: Secondary | ICD-10-CM

## 2019-05-26 MED ORDER — SENNA 8.6 MG PO TABS: 2.0000 | ORAL_TABLET | Freq: Two times a day (BID) | ORAL | 0 refills | Status: DC

## 2019-05-26 MED ORDER — POTASSIUM CHLORIDE CRYS ER 20 MEQ PO TBCR: 40.0000 meq | EXTENDED_RELEASE_TABLET | ORAL | 0 refills | Status: DC

## 2019-05-26 MED ORDER — FERROUS SULFATE 325 (65 FE) MG PO TABS: 325.0000 mg | ORAL_TABLET | Freq: Two times a day (BID) | ORAL | 0 refills | Status: DC

## 2019-05-26 MED ORDER — CIPROFLOXACIN HCL 500 MG PO TABS: 500.0000 mg | ORAL_TABLET | Freq: Two times a day (BID) | ORAL | 0 refills | Status: AC
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
## 2019-05-26 NOTE — Progress Notes (Signed)
Patient verified DOB Patient has taken medication today. Patient has eaten today. Patient complains of back pain at a 3.

## 2019-05-26 NOTE — Telephone Encounter (Signed)
Needs to be refilled and managed by patients primary care provider. 

## 2019-05-28 ENCOUNTER — Other Ambulatory Visit: Payer: Self-pay | Admitting: Family Medicine

## 2019-05-30 ENCOUNTER — Other Ambulatory Visit: Payer: Self-pay | Admitting: Family Medicine

## 2019-06-05 ENCOUNTER — Encounter: Payer: Self-pay | Admitting: Physical Medicine and Rehabilitation

## 2019-06-05 ENCOUNTER — Other Ambulatory Visit: Payer: Self-pay

## 2019-06-05 ENCOUNTER — Telehealth: Payer: Medicare Other | Admitting: Physician Assistant

## 2019-06-05 ENCOUNTER — Encounter
Payer: Medicare Other | Attending: Physical Medicine and Rehabilitation | Admitting: Physical Medicine and Rehabilitation

## 2019-06-05 DIAGNOSIS — G8250 Quadriplegia, unspecified: Secondary | ICD-10-CM | POA: Insufficient documentation

## 2019-06-05 DIAGNOSIS — R252 Cramp and spasm: Secondary | ICD-10-CM | POA: Insufficient documentation

## 2019-06-05 DIAGNOSIS — R5381 Other malaise: Secondary | ICD-10-CM | POA: Insufficient documentation

## 2019-06-05 DIAGNOSIS — Z79899 Other long term (current) drug therapy: Secondary | ICD-10-CM | POA: Insufficient documentation

## 2019-06-05 DIAGNOSIS — K592 Neurogenic bowel, not elsewhere classified: Secondary | ICD-10-CM | POA: Diagnosis not present

## 2019-06-05 DIAGNOSIS — G894 Chronic pain syndrome: Secondary | ICD-10-CM

## 2019-06-05 DIAGNOSIS — M62838 Other muscle spasm: Secondary | ICD-10-CM | POA: Diagnosis not present

## 2019-06-05 DIAGNOSIS — F329 Major depressive disorder, single episode, unspecified: Secondary | ICD-10-CM | POA: Insufficient documentation

## 2019-06-05 DIAGNOSIS — M545 Low back pain, unspecified: Secondary | ICD-10-CM

## 2019-06-05 MED ORDER — DIAZEPAM 5 MG PO TABS: 5.0000 mg | ORAL_TABLET | Freq: Two times a day (BID) | ORAL | 3 refills | Status: DC

## 2019-06-05 MED ORDER — PHENTERMINE HCL 15 MG PO CAPS: 15.0000 mg | ORAL_CAPSULE | ORAL | 0 refills | Status: DC

## 2019-06-05 MED ORDER — POTASSIUM CHLORIDE ER 10 MEQ PO TBCR: 10.0000 meq | EXTENDED_RELEASE_TABLET | Freq: Every day | ORAL | 11 refills | Status: DC

## 2019-06-05 MED ORDER — TRAZODONE HCL 50 MG PO TABS: 50.0000 mg | ORAL_TABLET | Freq: Every day | ORAL | 11 refills | Status: DC

## 2019-06-05 MED ORDER — TRAZODONE HCL 50 MG PO TABS: 50.0000 mg | ORAL_TABLET | Freq: Every evening | ORAL | 1 refills | Status: DC | PRN

## 2019-06-05 MED ORDER — TIZANIDINE HCL 4 MG PO TABS: 4.0000 mg | ORAL_TABLET | Freq: Three times a day (TID) | ORAL | 11 refills | Status: DC

## 2019-06-05 MED ORDER — DULOXETINE HCL 30 MG PO CPEP: 60.0000 mg | ORAL_CAPSULE | Freq: Every day | ORAL | 0 refills | Status: DC

## 2019-06-05 MED ORDER — TOPIRAMATE 25 MG PO TABS: 25.0000 mg | ORAL_TABLET | Freq: Every day | ORAL | 0 refills | Status: DC

## 2019-06-05 NOTE — Progress Notes (Signed)
Hi [name],   I'm sorry you are in pain. Based on what you shared with me, I feel your condition warrants further evaluation and I recommend that you be seen for a face to face office visit.  I see in your chart that pain is a chronic problem for you.  It is very important to have consistent follow-up appointments with your Primary Care Provider so they can manage your pain - or refer you to a specialist if needed.  Please call Dr. Siri Cole office for an appointment.   If you are in need of immediate care, please see below for our Urgent Care locations.    NOTE: If you entered your credit card information for this eVisit, you will not be charged. You may see a "hold" on your card for the $35 but that hold will drop off and you will not have a charge processed.   If you are having a true medical emergency please call 911.      For an urgent face to face visit, [place] has five urgent care centers for your convenience:      NEW:  [facility] [place] at [place] Get Driving Directions [id number] [place], [place] [address] . 10 am - 6pm Monday - Friday    [facility]) Get Driving Directions [id number] [address] [place], [place] [address] . 10 am to 8 pm Monday-Friday . 12 pm to 8 pm [facility] at [facility] Get Driving Directions [id number] [place], [place], [place] [address] . 8 am to 8 pm Monday-Friday . 9 am to 6 pm Saturday . 11 am to 6 pm Sunday     [facility] at [facility] Get Driving Directions  [id number] [address] [place], [place] [address] . 8 am to 8 pm Monday-Friday . 8 am to 4 pm [facility] at [place] Get Driving Directions [id number] [place],  [address] . 12 pm to 6 pm Monday-Friday      Your e-visit answers  were reviewed by a board certified advanced clinical practitioner to complete your personal care plan.  Thank you for using e-Visits.
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
## 2019-06-14 ENCOUNTER — Other Ambulatory Visit: Payer: Self-pay | Admitting: Physical Medicine and Rehabilitation

## 2019-06-19 ENCOUNTER — Encounter: Payer: Self-pay | Admitting: Family Medicine

## 2019-06-20 ENCOUNTER — Telehealth: Payer: Medicare Other | Admitting: Nurse Practitioner

## 2019-06-20 ENCOUNTER — Telehealth: Payer: Self-pay

## 2019-06-20 DIAGNOSIS — R21 Rash and other nonspecific skin eruption: Secondary | ICD-10-CM

## 2019-06-20 NOTE — Telephone Encounter (Signed)
Pt sent a MyChart message. Please respond

## 2019-06-20 NOTE — Progress Notes (Signed)
Based on what you shared with me it looks like you have a skin reaction,that should be evaluated in a face to face office visit. Reviewing your questionaire and looking at your pictures this doe sot look like a typical rash that can be treated in an evisit. You need a face to face evaluation for proper diagnosis and treatment    NOTE: If you entered your credit card information for this eVisit, you will not be charged. You may see a "hold" on your card for the $35 but that hold will drop off and you will not have a charge processed.  If you are having a true medical emergency please call 911.     For an urgent face to face visit, [place] has four urgent care centers for your convenience:   . [facility]    [phone]                  Get Driving Directions  [id number] [place], [place] [address] . 10 am to 8 pm Monday-Friday . 12 pm to 8 pm Saturday-Sunday   . [facility] at [place]                  Get Driving Directions  [phone] [address], [place], [place] [address] . 8 am to 8 pm Monday-Friday . 9 am to 6 pm Saturday . 11 am to 6 pm Sunday   . [facility] at [place]                  Get Driving Directions   [address] [place], [place] [address] . 8 am to 8 pm Monday-Friday . 8 am to 4 pm Saturday-Sunday    . [facility] at [place]                    Get Driving Directions  [id number]  [address] [place], [place] [address]  . Monday-Friday, 12 PM to 6 PM    Your e-visit answers were reviewed by a board certified advanced clinical practitioner to complete your personal care plan.  Thank you for using e-Visits.

## 2019-06-20 NOTE — Telephone Encounter (Signed)
Needs office visit.

## 2019-06-21 ENCOUNTER — Other Ambulatory Visit: Payer: Self-pay

## 2019-06-21 ENCOUNTER — Ambulatory Visit (INDEPENDENT_AMBULATORY_CARE_PROVIDER_SITE_OTHER): Payer: Medicare Other | Admitting: Physician Assistant

## 2019-06-21 DIAGNOSIS — Z960 Presence of urogenital implants: Secondary | ICD-10-CM | POA: Diagnosis not present

## 2019-06-21 DIAGNOSIS — R3989 Other symptoms and signs involving the genitourinary system: Secondary | ICD-10-CM | POA: Diagnosis not present

## 2019-06-21 DIAGNOSIS — N9089 Other specified noninflammatory disorders of vulva and perineum: Secondary | ICD-10-CM

## 2019-06-21 DIAGNOSIS — R399 Unspecified symptoms and signs involving the genitourinary system: Secondary | ICD-10-CM

## 2019-06-21 NOTE — Telephone Encounter (Signed)
May you please contact pt and schedule a visit with pcp

## 2019-06-21 NOTE — Progress Notes (Signed)
Virtual Visit via Telephone Note  I connected with Tracey Hall on 06/21/19 at  2:10 PM EST by telephone and verified that I am speaking with the correct person using two identifiers.   I discussed the limitations, risks, security and privacy concerns of performing an evaluation and management service by telephone and the availability of in person appointments. I also discussed with the patient that there may be a patient responsible charge related to this service. The patient expressed understanding and agreed to proceed.  Patient location:  home My Location:  [place] office Persons on the call:  Me and the patient  History of Present Illness:  The patient was just added to the schedule 4 minutes prior to me speaking with her.  She has an indwelling catheter since September.  She thinks she is getting a UTI and has some blisters in her genitalia.  Subjective low grade temp, pelvic cramping.  No N/V/D.  She is unable to bring a urine specimen   Observations/Objective: NAD.  A&Ox3  Assessment and Plan:  1. UTI symptoms Needs care today.  Go to UC or ED to be seen. Patient is unable to come in before we close today.      Follow Up Instructions: See PCP as previously planned   I discussed the assessment and treatment plan with the patient. The patient was provided an opportunity to ask questions and all were answered. The patient agreed with the plan and demonstrated an understanding of the instructions.   The patient was advised to call back or seek an in-person evaluation if the symptoms worsen or if the condition fails to improve as anticipated.  I provided 9 minutes of non-face-to-face time during this encounter.   Freeman Caldron, PA-C  Patient ID: Tracey Hall, female   DOB: 1981/01/16, 38 y.o.   MRN: [id number]

## 2019-06-27 ENCOUNTER — Telehealth: Payer: Self-pay | Admitting: *Deleted

## 2019-06-27 ENCOUNTER — Other Ambulatory Visit: Payer: Self-pay | Admitting: Physical Medicine and Rehabilitation

## 2019-06-27 NOTE — Telephone Encounter (Signed)
Patient left a message asking for Dr. Dagoberto Ligas to please call her back. She wishes to discuss eczema, UTI and the medication that was ordered by Dr. Dagoberto Ligas today

## 2019-06-28 NOTE — Telephone Encounter (Signed)
L/M- no answer- so will call again tomorrow.

## 2019-07-02 ENCOUNTER — Other Ambulatory Visit: Payer: Self-pay | Admitting: Family Medicine

## 2019-07-02 ENCOUNTER — Other Ambulatory Visit: Payer: Self-pay | Admitting: Physical Medicine and Rehabilitation

## 2019-07-02 DIAGNOSIS — N319 Neuromuscular dysfunction of bladder, unspecified: Secondary | ICD-10-CM

## 2019-07-02 DIAGNOSIS — N390 Urinary tract infection, site not specified: Secondary | ICD-10-CM

## 2019-07-03 ENCOUNTER — Other Ambulatory Visit: Payer: Self-pay

## 2019-07-03 ENCOUNTER — Telehealth: Payer: Self-pay | Admitting: Family Medicine

## 2019-07-03 ENCOUNTER — Other Ambulatory Visit (HOSPITAL_BASED_OUTPATIENT_CLINIC_OR_DEPARTMENT_OTHER): Payer: Medicare Other | Admitting: Family Medicine

## 2019-07-03 DIAGNOSIS — G8250 Quadriplegia, unspecified: Secondary | ICD-10-CM | POA: Diagnosis not present

## 2019-07-03 DIAGNOSIS — N319 Neuromuscular dysfunction of bladder, unspecified: Secondary | ICD-10-CM

## 2019-07-03 DIAGNOSIS — N390 Urinary tract infection, site not specified: Secondary | ICD-10-CM | POA: Diagnosis not present

## 2019-07-03 NOTE — Progress Notes (Signed)
Patient ID: Tracey Hall, female   DOB: Nov 04, 1980, 39 y.o.   MRN: [id number]   Request received by patient for prescription for Cipro.  Patient had telemedicine visit with me on 05/26/2019 and has had 06/05/2019 electronic health visit for which she was told to be seen for a face-to-face appointment.  A 06/20/2019 electronic visit for skin eruption for which patient was told by provider that they could not treat her electronically/by telemedicine and she was told to go to urgent care for a face-to-face visit with a provider.  She additionally had a telehealth visit 06/21/2019 for UTI symptoms with another provider and again was told that she needed a face-to-face visit.  I will place a stat urology referral for patient to be seen in follow-up of her recurrent urinary tract infections and neurogenic bladder.  She will also be contacted to see if a face-to-face appointment can be arranged in the next 1 to 2 days in office and if not she will be asked to go to urgent care for further evaluation.

## 2019-07-03 NOTE — Telephone Encounter (Signed)
duplicate

## 2019-07-06 ENCOUNTER — Encounter: Payer: Medicare Other | Admitting: Obstetrics and Gynecology

## 2019-07-06 ENCOUNTER — Other Ambulatory Visit: Payer: Self-pay

## 2019-07-06 ENCOUNTER — Ambulatory Visit: Payer: Medicare Other | Attending: Family Medicine | Admitting: Physician Assistant

## 2019-07-06 NOTE — Progress Notes (Signed)
Virtual Visit via Telephone Note  I connected with Tracey Hall on 07/06/19 at  3:50 PM EST by telephone and verified that I am speaking with the correct person using two identifiers.   I discussed the limitations, risks, security and privacy concerns of performing an evaluation and management service by telephone and the availability of in person appointments. I also discussed with the patient that there may be a patient responsible charge related to this service. The patient expressed understanding and agreed to proceed.  PATIENT visit by telephone virtually in the context of Covid-19 pandemic. Patient location:  home My Location:  Remote/home office Persons on the call: me and the patient    History of Present Illness:    Observations/Objective:   Assessment and Plan:   Follow Up Instructions:    I discussed the assessment and treatment plan with the patient. The patient was provided an opportunity to ask questions and all were answered. The patient agreed with the plan and demonstrated an understanding of the instructions.   The patient was advised to call back or seek an in-person evaluation if the symptoms worsen or if the condition fails to improve as anticipated.  I provided *** minutes of non-face-to-face time during this encounter.   Freeman Caldron, PA-C  Patient ID: Tracey Hall, female   DOB: Jul 10, 1980, 39 y.o.   MRN: [id number]

## 2019-07-11 DIAGNOSIS — N390 Urinary tract infection, site not specified: Secondary | ICD-10-CM | POA: Diagnosis not present

## 2019-07-11 DIAGNOSIS — B961 Klebsiella pneumoniae [K. pneumoniae] as the cause of diseases classified elsewhere: Secondary | ICD-10-CM | POA: Diagnosis not present

## 2019-07-11 DIAGNOSIS — R3914 Feeling of incomplete bladder emptying: Secondary | ICD-10-CM | POA: Diagnosis not present

## 2019-07-18 NOTE — Telephone Encounter (Signed)
LM that called pt- went straight to voicemail- L/m that will call between 12-2 Wednesday.

## 2019-07-19 NOTE — Telephone Encounter (Signed)
Have attempted to call pt back x2- went straight to voicemail after 1 ring.  Will attempt to call back tomorrow.

## 2019-07-20 ENCOUNTER — Telehealth: Payer: Self-pay

## 2019-07-20 NOTE — Telephone Encounter (Signed)
CMN for urology products faxed to [facility]

## 2019-07-20 NOTE — Telephone Encounter (Signed)
Left detailed message that not sure which nerve pain medicine she was talking about.Marland KitchenMarland KitchenDuloxetine? We stopped because she didn't think it was helping. Can restart it if desired. Can always refill Tramadol if she wants. Need to touch base about Weight loss meds.  And touch base about what she needs [facility] for- cannot order without knowing why.  Will con't to attempt to reach her.

## 2019-07-27 ENCOUNTER — Encounter: Payer: Medicare Other | Admitting: Obstetrics and Gynecology

## 2019-07-27 NOTE — Progress Notes (Unsigned)
Spoke with Dr.Ervin about patient no show of appointment. Provider states patient may reschedule if desired.

## 2019-07-28 ENCOUNTER — Encounter: Payer: Self-pay | Admitting: Physical Medicine and Rehabilitation

## 2019-07-28 ENCOUNTER — Other Ambulatory Visit: Payer: Self-pay

## 2019-07-28 ENCOUNTER — Encounter
Payer: Medicare Other | Attending: Physical Medicine and Rehabilitation | Admitting: Physical Medicine and Rehabilitation

## 2019-07-28 DIAGNOSIS — N390 Urinary tract infection, site not specified: Secondary | ICD-10-CM

## 2019-07-28 DIAGNOSIS — G904 Autonomic dysreflexia: Secondary | ICD-10-CM | POA: Diagnosis not present

## 2019-07-28 DIAGNOSIS — K592 Neurogenic bowel, not elsewhere classified: Secondary | ICD-10-CM | POA: Insufficient documentation

## 2019-07-28 DIAGNOSIS — G894 Chronic pain syndrome: Secondary | ICD-10-CM

## 2019-07-28 DIAGNOSIS — G8250 Quadriplegia, unspecified: Secondary | ICD-10-CM | POA: Diagnosis not present

## 2019-07-28 DIAGNOSIS — N319 Neuromuscular dysfunction of bladder, unspecified: Secondary | ICD-10-CM | POA: Diagnosis not present

## 2019-07-28 DIAGNOSIS — M62838 Other muscle spasm: Secondary | ICD-10-CM

## 2019-07-28 DIAGNOSIS — F329 Major depressive disorder, single episode, unspecified: Secondary | ICD-10-CM | POA: Insufficient documentation

## 2019-07-28 DIAGNOSIS — Z79899 Other long term (current) drug therapy: Secondary | ICD-10-CM | POA: Insufficient documentation

## 2019-07-28 DIAGNOSIS — R5381 Other malaise: Secondary | ICD-10-CM | POA: Insufficient documentation

## 2019-07-28 DIAGNOSIS — R252 Cramp and spasm: Secondary | ICD-10-CM | POA: Insufficient documentation

## 2019-07-28 MED ORDER — DULOXETINE HCL 30 MG PO CPEP: 30.0000 mg | ORAL_CAPSULE | Freq: Every day | ORAL | 5 refills | Status: DC

## 2019-07-28 MED ORDER — TRAMADOL HCL 50 MG PO TABS: 50.0000 mg | ORAL_TABLET | Freq: Three times a day (TID) | ORAL | 1 refills | Status: DC | PRN
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
## 2019-07-31 ENCOUNTER — Other Ambulatory Visit: Payer: Self-pay

## 2019-08-01 ENCOUNTER — Other Ambulatory Visit: Payer: Self-pay | Admitting: Physical Medicine and Rehabilitation

## 2019-08-01 MED ORDER — PHENTERMINE HCL 15 MG PO CAPS: 15.0000 mg | ORAL_CAPSULE | Freq: Every day | ORAL | 0 refills | Status: DC

## 2019-08-01 MED ORDER — TOPIRAMATE 25 MG PO TABS: 25.0000 mg | ORAL_TABLET | Freq: Every day | ORAL | 0 refills | Status: DC

## 2019-08-01 NOTE — Telephone Encounter (Signed)
Recieved electronic medication refill request for tizanidine and Topamax.  The muscle relaxer was mentioned as to continue so using clinical protocol it was refilled.  However the other medication was not mentioned at all.  Therefore It is unclear as to continue this medication.  Please advise.

## 2019-08-01 NOTE — Telephone Encounter (Signed)
Recieved electronic medication refill request for phentermine.  There is weight issues information within the last note but no mention of medication. Unsure if ok to refill.  Please advise.

## 2019-08-08 ENCOUNTER — Telehealth: Payer: Self-pay

## 2019-08-08 NOTE — Telephone Encounter (Signed)
Another CMN for urinary catheters/supplies faxed to [facility].

## 2019-08-12 ENCOUNTER — Telehealth: Payer: Self-pay

## 2019-08-14 ENCOUNTER — Telehealth: Payer: Self-pay

## 2019-08-14 MED ORDER — TOPIRAMATE 25 MG PO TABS: 25.0000 mg | ORAL_TABLET | Freq: Every day | ORAL | 0 refills | Status: DC

## 2019-08-14 MED ORDER — PHENTERMINE HCL 15 MG PO CAPS: 15.0000 mg | ORAL_CAPSULE | Freq: Every day | ORAL | 0 refills | Status: DC

## 2019-08-14 NOTE — Telephone Encounter (Signed)
Last script was set to print on mistake, reordered for medication to be sent as [place].  Verified that script is marked as escribed before sending to provider.

## 2019-08-14 NOTE — Telephone Encounter (Signed)
Yes and I sent the Rx's. Thank you

## 2019-08-14 NOTE — Telephone Encounter (Signed)
Recieved electronic medication refill request for Topamax and Phentermine medications.  According to last note dated on 07-28-2019:  Topamax = 8. No meds will treat AD headaches.   Phentermine = 7. To lose weight, Reduce calories by reducing calories in drinks- zero to low calorie drinks- no juice and no soda.   Not sure if ok to refill medication based on this information.  Please advise.

## 2019-09-02 ENCOUNTER — Other Ambulatory Visit: Payer: Self-pay | Admitting: Physical Medicine and Rehabilitation

## 2019-09-25 ENCOUNTER — Other Ambulatory Visit: Payer: Self-pay | Admitting: Physical Medicine and Rehabilitation

## 2019-09-25 ENCOUNTER — Encounter
Payer: Medicare Other | Attending: Physical Medicine and Rehabilitation | Admitting: Physical Medicine and Rehabilitation

## 2019-09-25 DIAGNOSIS — G8250 Quadriplegia, unspecified: Secondary | ICD-10-CM | POA: Insufficient documentation

## 2019-09-25 DIAGNOSIS — R5381 Other malaise: Secondary | ICD-10-CM | POA: Insufficient documentation

## 2019-09-25 DIAGNOSIS — F329 Major depressive disorder, single episode, unspecified: Secondary | ICD-10-CM | POA: Insufficient documentation

## 2019-09-25 DIAGNOSIS — R252 Cramp and spasm: Secondary | ICD-10-CM | POA: Insufficient documentation

## 2019-09-25 DIAGNOSIS — Z79899 Other long term (current) drug therapy: Secondary | ICD-10-CM | POA: Insufficient documentation

## 2019-09-25 DIAGNOSIS — K592 Neurogenic bowel, not elsewhere classified: Secondary | ICD-10-CM | POA: Insufficient documentation

## 2019-09-25 NOTE — Telephone Encounter (Signed)
Okay to refill Topiramate? No indication in last note to continue.

## 2019-10-02 ENCOUNTER — Other Ambulatory Visit: Payer: Self-pay | Admitting: Physical Medicine and Rehabilitation

## 2019-10-20 ENCOUNTER — Telehealth: Payer: Self-pay | Admitting: *Deleted

## 2019-10-20 ENCOUNTER — Ambulatory Visit: Payer: Self-pay | Admitting: *Deleted

## 2019-10-20 NOTE — Telephone Encounter (Signed)
Patient having complaints of increased abdominal pain,tea colored urine and green vaginal discharge. Pt states she had an indwelling catheter placed since September but her foley catheter was getting a lot of "build up". Pt states that she switched to a leg bag and noticed that her urine was not tea colored and she is having green vaginal discharge, which may be from trichomonas. 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 Pt states her husband took her catheter out yesterday but it was not replaced. Pt states she has urinated some but is still experiencing increased pain. Pt advised to be seen in emergency room and to contact PCP to see if a prescription for more catheters can be obtained. Pt verbalized understanding.   Reason for Disposition . SEVERE abdominal pain  Answer Assessment - Initial Assessment Questions 1. SYMPTOMS: "What symptoms are you concerned about?"     Increased pain since catheter was taken out 2. ONSET:  "When did the symptoms start?"     yesterday 3. FEVER: "Is there a fever?" If so, ask: "What is the temperature, how was it measured, and when did it start?"     Not assessed 4. ABDOMINAL PAIN: "Is there any abdominal pain?" (e.g., Scale 1-10; or mild, moderate, severe)     Yes,severe 5. URINE COLOR: "What color is the urine?"  "Is there blood present in the urine?" (e.g., clear, yellow, cloudy, tea-colored, blood streaks, bright red)     Tea colored 6. ONSET: "When was the catheter inserted?"     Indwelling catheter was originally inserted Sep 7. OTHER SYMPTOMS: "Do you have any other symptoms?" (e.g., back pain, bad urine odor)      Green color discharge, calcium like buildup in catheter 8. PREGNANCY: "Is there any chance you are pregnant?" "When was your last menstrual period?"     Not assessed  Protocols used: URINARY CATHETER SYMPTOMS AND QUESTIONS-A-AH

## 2020-01-01 NOTE — Telephone Encounter (Signed)
Attempted to call pt- no answer- and didn't have voicemail. Will cont to try.

## 2020-01-04 ENCOUNTER — Telehealth: Payer: Medicare Other | Admitting: Family

## 2020-01-04 DIAGNOSIS — R300 Dysuria: Secondary | ICD-10-CM

## 2020-01-04 DIAGNOSIS — Z978 Presence of other specified devices: Secondary | ICD-10-CM

## 2020-01-04 NOTE — Progress Notes (Signed)
Based on what you shared with me, I feel your condition warrants further evaluation and I recommend that you be seen for a face to face office visit.  Please be seen by your urologist or primary care provider to ensure safety   NOTE: If you entered your credit card information for this eVisit, you will not be charged. You may see a "hold" on your card for the $35 but that hold will drop off and you will not have a charge processed.   If you are having a true medical emergency please call 911.      For an urgent face to face visit, [place] has five urgent care centers for your convenience:      NEW:  [facility] [place] at [place] Get Driving Directions [phone] [address], [place] [address] . 10 am - 6pm Monday - Friday    [facility] [place]) Get Driving Directions [phone] [address] [place], [place] [address] . 10 am to 8 pm Monday-Friday . 12 pm to 8 pm [facility] at [facility] Get Driving Directions [phone] [place], [place], [place] [address] . 8 am to 8 pm Monday-Friday . 9 am to 6 pm Saturday . 11 am to 6 pm Sunday     [facility] at [facility] Get Driving Directions  [phone] [address], [place] [address] . 8 am to 8 pm Monday-Friday . 8 am to 4 pm [facility] at [place] Get Driving Directions [phone] [address] [place], [place] [address] . 12 pm to 6 pm Monday-Friday      Your e-visit answers were reviewed by a board certified advanced clinical practitioner to complete your personal care plan.  Thank you for using e-Visits.

## 2020-02-07 ENCOUNTER — Other Ambulatory Visit: Payer: Self-pay | Admitting: Physical Medicine and Rehabilitation

## 2020-02-13 ENCOUNTER — Encounter: Payer: Self-pay | Admitting: Family Medicine

## 2020-04-05 ENCOUNTER — Encounter (HOSPITAL_COMMUNITY): Payer: Self-pay

## 2020-04-05 ENCOUNTER — Other Ambulatory Visit: Payer: Self-pay

## 2020-04-05 ENCOUNTER — Emergency Department (HOSPITAL_COMMUNITY)
Admission: EM | Admit: 2020-04-05 | Discharge: 2020-04-06 | Disposition: A | Discharge status: Home / Self Care | Payer: Medicare Other | Attending: Emergency Medicine | Admitting: Emergency Medicine

## 2020-04-05 DIAGNOSIS — D509 Iron deficiency anemia, unspecified: Secondary | ICD-10-CM | POA: Diagnosis not present

## 2020-04-05 DIAGNOSIS — K5641 Fecal impaction: Secondary | ICD-10-CM | POA: Diagnosis not present

## 2020-04-05 DIAGNOSIS — R109 Unspecified abdominal pain: Secondary | ICD-10-CM | POA: Diagnosis not present

## 2020-04-05 DIAGNOSIS — Z91040 Latex allergy status: Secondary | ICD-10-CM | POA: Insufficient documentation

## 2020-04-05 DIAGNOSIS — R1030 Lower abdominal pain, unspecified: Secondary | ICD-10-CM | POA: Diagnosis not present

## 2020-04-05 DIAGNOSIS — Z951 Presence of aortocoronary bypass graft: Secondary | ICD-10-CM | POA: Insufficient documentation

## 2020-04-05 DIAGNOSIS — I959 Hypotension, unspecified: Secondary | ICD-10-CM | POA: Diagnosis not present

## 2020-04-05 DIAGNOSIS — R1084 Generalized abdominal pain: Secondary | ICD-10-CM | POA: Diagnosis not present

## 2020-04-05 NOTE — ED Triage Notes (Signed)
Patient removed catheter over 24 hours ago, did not have the supplies to replace it, has not had any urine output since, complaining of abdominal pain.

## 2020-04-06 ENCOUNTER — Encounter (HOSPITAL_COMMUNITY): Payer: Self-pay

## 2020-04-06 ENCOUNTER — Emergency Department (HOSPITAL_COMMUNITY): Payer: Medicare Other

## 2020-04-06 DIAGNOSIS — K5641 Fecal impaction: Secondary | ICD-10-CM | POA: Diagnosis not present

## 2020-04-06 DIAGNOSIS — R109 Unspecified abdominal pain: Secondary | ICD-10-CM | POA: Diagnosis not present

## 2020-04-06 LAB — COMPREHENSIVE METABOLIC PANEL
ALT: 16 U/L (ref 0–44)
AST: 32 U/L (ref 15–41)
Albumin: 3.7 g/dL (ref 3.5–5.0)
Alkaline Phosphatase: 80 U/L (ref 38–126)
Anion gap: 9 (ref 5–15)
BUN: 14 mg/dL (ref 6–20)
CO2: 21 mmol/L — ABNORMAL LOW (ref 22–32)
Calcium: 8.5 mg/dL — ABNORMAL LOW (ref 8.9–10.3)
Chloride: 105 mmol/L (ref 98–111)
Creatinine, Ser: 0.64 mg/dL (ref 0.44–1.00)
GFR, Estimated: >60 mL/min (ref >60)
Glucose, Bld: 89 mg/dL (ref 70–99)
Potassium: 4.3 mmol/L (ref 3.5–5.1)
Sodium: 135 mmol/L (ref 135–145)
Total Bilirubin: 0.3 mg/dL (ref 0.3–1.2)
Total Protein: 7.7 g/dL (ref 6.5–8.1)

## 2020-04-06 LAB — URINALYSIS, ROUTINE W REFLEX MICROSCOPIC
Bilirubin Urine: NEGATIVE
Glucose, UA: NEGATIVE mg/dL
Hgb urine dipstick: NEGATIVE
Ketones, ur: NEGATIVE mg/dL
Nitrite: POSITIVE — AB
Protein, ur: NEGATIVE mg/dL
Specific Gravity, Urine: 1.014 (ref 1.005–1.030)
WBC, UA: >50 WBC/hpf — ABNORMAL HIGH (ref 0–5)
pH: 5 (ref 5.0–8.0)

## 2020-04-06 LAB — CBC WITH DIFFERENTIAL/PLATELET
Abs Immature Granulocytes: 0.04 10*3/uL (ref 0.00–0.07)
Basophils Absolute: 0 10*3/uL (ref 0.0–0.1)
Basophils Relative: 0 %
Eosinophils Absolute: 0.2 10*3/uL (ref 0.0–0.5)
Eosinophils Relative: 2 %
HCT: 23.5 % — ABNORMAL LOW (ref 36.0–46.0)
Hemoglobin: 6.8 g/dL — CL (ref 12.0–15.0)
Immature Granulocytes: 0 %
Lymphocytes Relative: 35 %
Lymphs Abs: 3.6 10*3/uL (ref 0.7–4.0)
MCH: 19 pg — ABNORMAL LOW (ref 26.0–34.0)
MCHC: 28.9 g/dL — ABNORMAL LOW (ref 30.0–36.0)
MCV: 65.6 fL — ABNORMAL LOW (ref 80.0–100.0)
Monocytes Absolute: 0.6 10*3/uL (ref 0.1–1.0)
Monocytes Relative: 6 %
Neutro Abs: 5.9 10*3/uL (ref 1.7–7.7)
Neutrophils Relative %: 57 %
Platelets: 689 10*3/uL — ABNORMAL HIGH (ref 150–400)
RBC: 3.58 MIL/uL — ABNORMAL LOW (ref 3.87–5.11)
RDW: 19.9 % — ABNORMAL HIGH (ref 11.5–15.5)
WBC: 10.3 10*3/uL (ref 4.0–10.5)
nRBC: 0 % (ref 0.0–0.2)

## 2020-04-06 LAB — LIPASE, BLOOD: Lipase: 22 U/L (ref 11–51)

## 2020-04-06 LAB — I-STAT BETA HCG BLOOD, ED (MC, WL, AP ONLY): I-stat hCG, quantitative: <5 m[IU]/mL (ref <5)

## 2020-04-06 MED ORDER — ONDANSETRON HCL 4 MG/2ML IJ SOLN
4.0000 mg | Freq: Once | INTRAMUSCULAR | Status: AC
  Administered 2020-04-06: 4 mg via INTRAVENOUS
  Filled 2020-04-06: qty 2

## 2020-04-06 MED ORDER — FERROUS SULFATE 325 (65 FE) MG PO TABS: 325.0000 mg | ORAL_TABLET | Freq: Three times a day (TID) | ORAL | 0 refills | Status: DC

## 2020-04-06 MED ORDER — SODIUM CHLORIDE (PF) 0.9 % IJ SOLN
INTRAMUSCULAR | Status: AC
  Filled 2020-04-06: qty 50

## 2020-04-06 MED ORDER — IOHEXOL 300 MG/ML  SOLN
100.0000 mL | Freq: Once | INTRAMUSCULAR | Status: AC | PRN
  Administered 2020-04-06: 100 mL via INTRAVENOUS

## 2020-04-06 MED ORDER — FENTANYL CITRATE (PF) 100 MCG/2ML IJ SOLN
100.0000 ug | Freq: Once | INTRAMUSCULAR | Status: AC
  Administered 2020-04-06: 100 ug via INTRAVENOUS
  Filled 2020-04-06: qty 2
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
## 2020-04-06 NOTE — ED Notes (Signed)
ED Provider at bedside. 

## 2020-04-10 NOTE — Telephone Encounter (Signed)
error 

## 2020-04-19 ENCOUNTER — Other Ambulatory Visit: Payer: Self-pay | Admitting: Family Medicine

## 2020-04-19 ENCOUNTER — Encounter: Payer: Self-pay | Admitting: Family Medicine

## 2020-04-19 ENCOUNTER — Telehealth: Payer: Medicare Other | Admitting: Nurse Practitioner

## 2020-04-19 DIAGNOSIS — M545 Low back pain, unspecified: Secondary | ICD-10-CM

## 2020-04-19 DIAGNOSIS — N390 Urinary tract infection, site not specified: Secondary | ICD-10-CM

## 2020-04-19 DIAGNOSIS — R300 Dysuria: Secondary | ICD-10-CM

## 2020-04-19 DIAGNOSIS — D509 Iron deficiency anemia, unspecified: Secondary | ICD-10-CM

## 2020-04-19 MED ORDER — FLUCONAZOLE 100 MG PO TABS: 200.0000 mg | ORAL_TABLET | Freq: Every day | ORAL | 0 refills | Status: DC

## 2020-04-19 MED ORDER — CIPROFLOXACIN HCL 500 MG PO TABS: 500.0000 mg | ORAL_TABLET | Freq: Two times a day (BID) | ORAL | 0 refills | Status: AC

## 2020-04-19 NOTE — Progress Notes (Signed)
Patient ID: Tracey Hall, female   DOB: [birth year], 39 y.o.   MRN: [id number]   See my chart message from patient from today, 04/19/2020.  Patient with complaint of worsening abdominal pain status post ED visit and wonders if the Foley catheter is inserted in the emergency department contains latex as she is allergic to latex.  Review of ED notes, patient also had constipation and her urinalysis was abnormal but does not appear to have been cultured.  She does have issues with recurrent urinary tract infections.  Based on urine culture from 2020 which is the most recent urine culture in her chart, she will be prescribed Cipro but was sent my chart message to go to the emergency department if she has worsening of abdominal pain tonight or this weekend and also to schedule an office follow-up in the next 1 to 2 weeks.  Orders have been received for patient's urinary incontinence supplies however patient has not had any recent visits therefore there is no documentation that her insurance requires for coverage of urinary supplies.  Patient also during her emergency department visit had hemoglobin of 6.8 and refused admission for transfusion.  Referral will be placed to hematology and patient was made aware through my chart message.  We will have triage nurse for this clinic contact the emergency department to see if the catheters used in the ED might have contained latex and patient will be notified.

## 2020-04-19 NOTE — Progress Notes (Signed)
Based on what you shared with me it looks like you have uti symptoms with back pain,that should be evaluated in a face to face office visit. Due to the associating back pain you will need a urinalysis and urine culture for proper treatment.    NOTE: If you entered your credit card information for this eVisit, you will not be charged. You may see a "hold" on your card for the $35 but that hold will drop off and you will not have a charge processed.  If you are having a true medical emergency please call 911.     For an urgent face to face visit, [facility] has four urgent care centers for your convenience:   . [facility]    [phone]                  Get Driving Directions  [address] [place], [place] [address] . 10 am to 8 pm Monday-Friday . 12 pm to 8 pm Saturday-Sunday   . [facility]  [phone]                  Get Driving Directions  [address] [place], [place] [address] . 8 am to 8 pm Monday-Friday . 9 am to 6 pm Saturday . 11 am to 6 pm Sunday   . [facility] [place]  [phone]                  Get Driving Directions   [address] [place], [place] [address] . 8 am to 8 pm Monday-Friday . 8 am to 4 pm Saturday-Sunday    . [facility] [place]                    Get Driving Directions  [phone]  [address] [place], [place] [address]  . Monday-Friday, 12 PM to 6 PM    Your e-visit answers were reviewed by a board certified advanced clinical practitioner to complete your personal care plan.  Thank you for using e-Visits.  

## 2020-04-19 NOTE — Telephone Encounter (Signed)
Got stool ball out- but has yeast UTI- sent in Dilflucan 200 mg x 1 and 100 mg daily x 6 days- for yeast UTI

## 2020-04-25 ENCOUNTER — Telehealth: Payer: Self-pay | Admitting: Hematology and Oncology

## 2020-04-25 NOTE — Telephone Encounter (Signed)
Received an urgent referral from Dr. Chapman Fitch for severe anemia. Tracey Hall has been cld and scheduled to see Dr. Lindi Adie on 11/11 at 415pm. I attempted to schedule Tracey Hall at an earlier appt date and time, as soon as 11/5, but the pt declined because she wanted a late appt.

## 2020-04-30 ENCOUNTER — Telehealth: Payer: Self-pay

## 2020-04-30 NOTE — Telephone Encounter (Signed)
Att to contact pt to schedule visit so [employer] medical supply form can be completed. No ans unable to lvm no vm set up

## 2020-05-02 ENCOUNTER — Telehealth: Payer: Self-pay | Admitting: Hematology and Oncology

## 2020-05-02 ENCOUNTER — Ambulatory Visit: Payer: Medicare Other

## 2020-05-02 ENCOUNTER — Inpatient Hospital Stay: Payer: Medicare Other | Admitting: Hematology and Oncology

## 2020-05-02 NOTE — Assessment & Plan Note (Signed)
04/06/20 showed Hg 6.8, HCT 23.5, MCV 65.6, platelets 689

## 2020-05-02 NOTE — Telephone Encounter (Signed)
Pt lft vm to cxl her new hem appt w/dr. Lindi Adie

## 2020-05-13 ENCOUNTER — Other Ambulatory Visit: Payer: Self-pay

## 2020-05-13 ENCOUNTER — Encounter
Payer: Medicare Other | Attending: Physical Medicine and Rehabilitation | Admitting: Physical Medicine and Rehabilitation

## 2020-05-13 ENCOUNTER — Encounter: Payer: Self-pay | Admitting: Physical Medicine and Rehabilitation

## 2020-05-13 VITALS — BP 142/97 | HR 76 | Temp 98.4°F

## 2020-05-13 DIAGNOSIS — G894 Chronic pain syndrome: Secondary | ICD-10-CM | POA: Diagnosis present

## 2020-05-13 DIAGNOSIS — G8250 Quadriplegia, unspecified: Secondary | ICD-10-CM | POA: Diagnosis present

## 2020-05-13 DIAGNOSIS — N3000 Acute cystitis without hematuria: Secondary | ICD-10-CM

## 2020-05-13 DIAGNOSIS — Z79891 Long term (current) use of opiate analgesic: Secondary | ICD-10-CM

## 2020-05-13 DIAGNOSIS — G904 Autonomic dysreflexia: Secondary | ICD-10-CM | POA: Diagnosis present

## 2020-05-13 DIAGNOSIS — R252 Cramp and spasm: Secondary | ICD-10-CM

## 2020-05-13 DIAGNOSIS — N319 Neuromuscular dysfunction of bladder, unspecified: Secondary | ICD-10-CM

## 2020-05-13 DIAGNOSIS — Z5181 Encounter for therapeutic drug level monitoring: Secondary | ICD-10-CM | POA: Diagnosis present

## 2020-05-13 MED ORDER — TRAMADOL HCL 50 MG PO TABS
50.0000 mg | ORAL_TABLET | Freq: Four times a day (QID) | ORAL | 3 refills | Status: DC | PRN
Start: 2020-05-13 — End: 2020-07-18

## 2020-05-13 MED ORDER — DULOXETINE HCL 60 MG PO CPEP
ORAL_CAPSULE | ORAL | 11 refills | Status: DC
Start: 2020-05-13 — End: 2021-09-23

## 2020-05-13 MED ORDER — HYDROCODONE-ACETAMINOPHEN 5-325 MG PO TABS: 1.0000 | ORAL_TABLET | Freq: Four times a day (QID) | ORAL | 0 refills | Status: DC | PRN

## 2020-05-13 MED ORDER — CEPHALEXIN 500 MG PO CAPS: 500.0000 mg | ORAL_CAPSULE | Freq: Three times a day (TID) | ORAL | 0 refills | Status: DC

## 2020-05-13 MED ORDER — TIZANIDINE HCL 4 MG PO TABS: 4.0000 mg | ORAL_TABLET | Freq: Three times a day (TID) | ORAL | 11 refills | Status: DC

## 2020-05-13 MED ORDER — DIAZEPAM 5 MG PO TABS
ORAL_TABLET | ORAL | 5 refills | Status: DC
Start: 2020-05-13 — End: 2020-11-27
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
## 2020-05-13 NOTE — Addendum Note (Signed)
Addended by: Jasmine December T on: 05/13/2020 12:08 PM   Modules accepted: Orders
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
## 2020-05-16 LAB — DRUG TOX MONITOR 1 W/CONF, ORAL FLD
Amphetamines: NEGATIVE ng/mL (ref <10)
Barbiturates: NEGATIVE ng/mL (ref <10)
Benzodiazepines: NEGATIVE ng/mL (ref <0.50)
Buprenorphine: NEGATIVE ng/mL (ref <0.10)
Buprenorphine: NEGATIVE ng/mL (ref <0.10)
Cocaine: NEGATIVE ng/mL (ref <5.0)
Fentanyl: NEGATIVE ng/mL (ref <0.10)
Heroin Metabolite: NEGATIVE ng/mL (ref <1.0)
MARIJUANA: NEGATIVE ng/mL (ref <2.5)
MDMA: NEGATIVE ng/mL (ref <10)
Meprobamate: NEGATIVE ng/mL (ref <2.5)
Methadone: NEGATIVE ng/mL (ref <5.0)
Nicotine Metabolite: NEGATIVE ng/mL (ref <5.0)
Norbuprenorphine: NEGATIVE ng/mL (ref <0.50)
Opiates: NEGATIVE ng/mL (ref <2.5)
Phencyclidine: NEGATIVE ng/mL (ref <10)
Tapentadol: NEGATIVE ng/mL (ref <5.0)
Tramadol: NEGATIVE ng/mL (ref <5.0)
Zolpidem: NEGATIVE ng/mL (ref <5.0)

## 2020-05-16 LAB — DRUG TOX ALC METAB W/CON, ORAL FLD: Alcohol Metabolite: NEGATIVE ng/mL (ref <25)

## 2020-05-27 ENCOUNTER — Telehealth: Payer: Self-pay | Admitting: *Deleted

## 2020-05-27 NOTE — Telephone Encounter (Signed)
Oral swab drug screen was negative for prescribed medications.

## 2020-07-08 ENCOUNTER — Encounter: Payer: Medicare Other | Admitting: Physical Medicine and Rehabilitation

## 2020-07-18 ENCOUNTER — Other Ambulatory Visit: Payer: Self-pay

## 2020-07-18 MED ORDER — TOPIRAMATE 25 MG PO TABS
25.0000 mg | ORAL_TABLET | Freq: Every day | ORAL | 11 refills | Status: DC
Start: 2020-07-18 — End: 2021-08-22

## 2020-07-18 MED ORDER — TOPIRAMATE 25 MG PO TABS
25.0000 mg | ORAL_TABLET | Freq: Every day | ORAL | 11 refills | Status: DC
Start: 2020-07-18 — End: 2020-07-18

## 2020-07-18 MED ORDER — TRAMADOL HCL 50 MG PO TABS: 50.0000 mg | ORAL_TABLET | Freq: Four times a day (QID) | ORAL | 5 refills | Status: DC | PRN

## 2020-07-18 MED ORDER — TRAMADOL HCL 50 MG PO TABS
50.0000 mg | ORAL_TABLET | Freq: Four times a day (QID) | ORAL | 5 refills | Status: DC | PRN
Start: 2020-07-18 — End: 2020-11-27

## 2020-07-18 MED ORDER — PHENTERMINE HCL 15 MG PO CAPS
ORAL_CAPSULE | ORAL | 0 refills | Status: DC
Start: 2020-07-18 — End: 2020-07-18

## 2020-07-18 MED ORDER — PHENTERMINE HCL 15 MG PO CAPS
ORAL_CAPSULE | ORAL | 0 refills | Status: DC
Start: 2020-07-18 — End: 2020-08-22

## 2020-07-18 NOTE — Telephone Encounter (Signed)
Renewed the phenteramine 15 mg daily and topamax 25 mg QHS 11 refills, and tramadol 50 mg #5 refills.   Everything else has refills on it, including 3 months on valium, many months on everything else.

## 2020-08-09 ENCOUNTER — Encounter
Payer: Medicare Other | Attending: Physical Medicine and Rehabilitation | Admitting: Physical Medicine and Rehabilitation

## 2020-08-09 DIAGNOSIS — R252 Cramp and spasm: Secondary | ICD-10-CM | POA: Insufficient documentation

## 2020-08-09 DIAGNOSIS — Z79891 Long term (current) use of opiate analgesic: Secondary | ICD-10-CM | POA: Insufficient documentation

## 2020-08-09 DIAGNOSIS — Z5181 Encounter for therapeutic drug level monitoring: Secondary | ICD-10-CM | POA: Insufficient documentation

## 2020-08-09 DIAGNOSIS — N319 Neuromuscular dysfunction of bladder, unspecified: Secondary | ICD-10-CM | POA: Insufficient documentation

## 2020-08-09 DIAGNOSIS — G904 Autonomic dysreflexia: Secondary | ICD-10-CM | POA: Insufficient documentation

## 2020-08-09 DIAGNOSIS — N3000 Acute cystitis without hematuria: Secondary | ICD-10-CM | POA: Insufficient documentation

## 2020-08-09 DIAGNOSIS — G8250 Quadriplegia, unspecified: Secondary | ICD-10-CM | POA: Insufficient documentation

## 2020-08-09 DIAGNOSIS — G894 Chronic pain syndrome: Secondary | ICD-10-CM | POA: Insufficient documentation

## 2020-08-21 ENCOUNTER — Other Ambulatory Visit: Payer: Self-pay

## 2020-08-21 ENCOUNTER — Other Ambulatory Visit: Payer: Self-pay | Admitting: Physical Medicine and Rehabilitation

## 2020-08-22 ENCOUNTER — Other Ambulatory Visit: Payer: Self-pay | Admitting: Physical Medicine and Rehabilitation

## 2020-08-22 MED ORDER — HYDROCODONE-ACETAMINOPHEN 5-325 MG PO TABS
1.0000 | ORAL_TABLET | Freq: Four times a day (QID) | ORAL | 0 refills | Status: DC | PRN
Start: 2020-08-22 — End: 2020-12-28

## 2020-08-28 NOTE — Progress Notes (Signed)
Opened in error - please disregard

## 2020-09-21 ENCOUNTER — Other Ambulatory Visit: Payer: Self-pay | Admitting: Physical Medicine and Rehabilitation

## 2020-09-23 NOTE — Telephone Encounter (Signed)
Refill request for Phentermine and Trazodone. No mention to continue in last note. Is this okay to fill?

## 2020-10-09 ENCOUNTER — Telehealth: Payer: Self-pay | Admitting: *Deleted

## 2020-10-09 NOTE — Telephone Encounter (Signed)
We received a call from I assume a catheter supply company asking if Dr Dagoberto Ligas received the cath orders they faxed [place].  It looks like she was only seen once 05/13/21 and canceled Jan appt and no showed for February appt.

## 2020-10-11 ENCOUNTER — Emergency Department (HOSPITAL_COMMUNITY): Payer: Medicare Other

## 2020-10-11 ENCOUNTER — Inpatient Hospital Stay (HOSPITAL_COMMUNITY)
Admission: EM | Admit: 2020-10-11 | Discharge: 2020-10-15 | DRG: 698 | Disposition: A | Discharge status: Home / Self Care | Payer: Medicare Other | Attending: Internal Medicine | Admitting: Internal Medicine

## 2020-10-11 ENCOUNTER — Encounter (HOSPITAL_COMMUNITY): Payer: Self-pay | Admitting: Emergency Medicine

## 2020-10-11 ENCOUNTER — Other Ambulatory Visit: Payer: Self-pay

## 2020-10-11 DIAGNOSIS — K5900 Constipation, unspecified: Secondary | ICD-10-CM | POA: Diagnosis present

## 2020-10-11 DIAGNOSIS — R110 Nausea: Secondary | ICD-10-CM | POA: Diagnosis present

## 2020-10-11 DIAGNOSIS — I959 Hypotension, unspecified: Secondary | ICD-10-CM | POA: Diagnosis present

## 2020-10-11 DIAGNOSIS — Z79899 Other long term (current) drug therapy: Secondary | ICD-10-CM | POA: Diagnosis not present

## 2020-10-11 DIAGNOSIS — R9431 Abnormal electrocardiogram [ECG] [EKG]: Secondary | ICD-10-CM | POA: Diagnosis not present

## 2020-10-11 DIAGNOSIS — Z823 Family history of stroke: Secondary | ICD-10-CM

## 2020-10-11 DIAGNOSIS — D259 Leiomyoma of uterus, unspecified: Secondary | ICD-10-CM | POA: Diagnosis present

## 2020-10-11 DIAGNOSIS — G894 Chronic pain syndrome: Secondary | ICD-10-CM | POA: Diagnosis present

## 2020-10-11 DIAGNOSIS — D509 Iron deficiency anemia, unspecified: Secondary | ICD-10-CM | POA: Diagnosis present

## 2020-10-11 DIAGNOSIS — G8250 Quadriplegia, unspecified: Secondary | ICD-10-CM | POA: Diagnosis present

## 2020-10-11 DIAGNOSIS — S14109S Unspecified injury at unspecified level of cervical spinal cord, sequela: Secondary | ICD-10-CM | POA: Diagnosis not present

## 2020-10-11 DIAGNOSIS — D649 Anemia, unspecified: Secondary | ICD-10-CM | POA: Diagnosis not present

## 2020-10-11 DIAGNOSIS — E860 Dehydration: Secondary | ICD-10-CM | POA: Diagnosis not present

## 2020-10-11 DIAGNOSIS — Z91040 Latex allergy status: Secondary | ICD-10-CM | POA: Diagnosis not present

## 2020-10-11 DIAGNOSIS — Z20822 Contact with and (suspected) exposure to covid-19: Secondary | ICD-10-CM | POA: Diagnosis present

## 2020-10-11 DIAGNOSIS — X58XXXS Exposure to other specified factors, sequela: Secondary | ICD-10-CM | POA: Diagnosis present

## 2020-10-11 DIAGNOSIS — T1490XS Injury, unspecified, sequela: Secondary | ICD-10-CM

## 2020-10-11 DIAGNOSIS — Y738 Miscellaneous gastroenterology and urology devices associated with adverse incidents, not elsewhere classified: Secondary | ICD-10-CM | POA: Diagnosis present

## 2020-10-11 DIAGNOSIS — N3000 Acute cystitis without hematuria: Secondary | ICD-10-CM | POA: Diagnosis present

## 2020-10-11 DIAGNOSIS — N920 Excessive and frequent menstruation with regular cycle: Secondary | ICD-10-CM | POA: Diagnosis present

## 2020-10-11 DIAGNOSIS — B961 Klebsiella pneumoniae [K. pneumoniae] as the cause of diseases classified elsewhere: Secondary | ICD-10-CM | POA: Diagnosis present

## 2020-10-11 DIAGNOSIS — T83511A Infection and inflammatory reaction due to indwelling urethral catheter, initial encounter: Principal | ICD-10-CM | POA: Diagnosis present

## 2020-10-11 DIAGNOSIS — D219 Benign neoplasm of connective and other soft tissue, unspecified: Secondary | ICD-10-CM

## 2020-10-11 DIAGNOSIS — R109 Unspecified abdominal pain: Secondary | ICD-10-CM | POA: Diagnosis present

## 2020-10-11 DIAGNOSIS — A419 Sepsis, unspecified organism: Secondary | ICD-10-CM | POA: Diagnosis not present

## 2020-10-11 DIAGNOSIS — N390 Urinary tract infection, site not specified: Secondary | ICD-10-CM | POA: Diagnosis present

## 2020-10-11 DIAGNOSIS — Z888 Allergy status to other drugs, medicaments and biological substances status: Secondary | ICD-10-CM | POA: Diagnosis not present

## 2020-10-11 DIAGNOSIS — Z87440 Personal history of urinary (tract) infections: Secondary | ICD-10-CM | POA: Diagnosis not present

## 2020-10-11 DIAGNOSIS — N319 Neuromuscular dysfunction of bladder, unspecified: Secondary | ICD-10-CM | POA: Diagnosis present

## 2020-10-11 DIAGNOSIS — R1030 Lower abdominal pain, unspecified: Secondary | ICD-10-CM | POA: Diagnosis not present

## 2020-10-11 LAB — I-STAT BETA HCG BLOOD, ED (MC, WL, AP ONLY): I-stat hCG, quantitative: <5 m[IU]/mL (ref <5)

## 2020-10-11 MED ORDER — SODIUM CHLORIDE 0.9 % IV BOLUS (SEPSIS)
1000.0000 mL | Freq: Once | INTRAVENOUS | Status: AC
  Administered 2020-10-11: 1000 mL via INTRAVENOUS

## 2020-10-11 MED ORDER — SODIUM CHLORIDE 0.9 % IV SOLN
1.0000 g | Freq: Once | INTRAVENOUS | Status: AC
  Administered 2020-10-11: 1 g via INTRAVENOUS
  Filled 2020-10-11: qty 10

## 2020-10-11 MED ORDER — SODIUM CHLORIDE 0.9 % IV BOLUS (SEPSIS)
1000.0000 mL | Freq: Once | INTRAVENOUS | Status: AC
  Administered 2020-10-12: 1000 mL via INTRAVENOUS

## 2020-10-11 MED ORDER — SODIUM CHLORIDE 0.9 % IV SOLN
1000.0000 mL | INTRAVENOUS | Status: DC
  Administered 2020-10-12 – 2020-10-13 (×4): 1000 mL via INTRAVENOUS

## 2020-10-11 NOTE — ED Notes (Signed)
Pt. Made aware for the need of urine specimen. 

## 2020-10-11 NOTE — ED Triage Notes (Signed)
Patient presents with complaints of possible UTI and fever. Patient states that this morning, she noticed her catheter wasn't draining, and throughout the day she has become distended and has lower abdominal pain. Patient states that she believes she has a UTI due to her pain and catheter not draining. Patient also states since her last admission, she is waking up with blood in her mouth.

## 2020-10-12 ENCOUNTER — Encounter (HOSPITAL_COMMUNITY): Payer: Self-pay

## 2020-10-12 ENCOUNTER — Emergency Department (HOSPITAL_COMMUNITY): Payer: Medicare Other

## 2020-10-12 DIAGNOSIS — Z888 Allergy status to other drugs, medicaments and biological substances status: Secondary | ICD-10-CM | POA: Diagnosis not present

## 2020-10-12 DIAGNOSIS — R1030 Lower abdominal pain, unspecified: Secondary | ICD-10-CM

## 2020-10-12 DIAGNOSIS — N319 Neuromuscular dysfunction of bladder, unspecified: Secondary | ICD-10-CM | POA: Diagnosis present

## 2020-10-12 DIAGNOSIS — R110 Nausea: Secondary | ICD-10-CM | POA: Diagnosis not present

## 2020-10-12 DIAGNOSIS — R109 Unspecified abdominal pain: Secondary | ICD-10-CM | POA: Diagnosis present

## 2020-10-12 DIAGNOSIS — D649 Anemia, unspecified: Secondary | ICD-10-CM | POA: Diagnosis not present

## 2020-10-12 DIAGNOSIS — Y738 Miscellaneous gastroenterology and urology devices associated with adverse incidents, not elsewhere classified: Secondary | ICD-10-CM | POA: Diagnosis present

## 2020-10-12 DIAGNOSIS — X58XXXS Exposure to other specified factors, sequela: Secondary | ICD-10-CM | POA: Diagnosis present

## 2020-10-12 DIAGNOSIS — Z79899 Other long term (current) drug therapy: Secondary | ICD-10-CM | POA: Diagnosis not present

## 2020-10-12 DIAGNOSIS — G894 Chronic pain syndrome: Secondary | ICD-10-CM | POA: Diagnosis present

## 2020-10-12 DIAGNOSIS — K5900 Constipation, unspecified: Secondary | ICD-10-CM | POA: Diagnosis present

## 2020-10-12 DIAGNOSIS — G8250 Quadriplegia, unspecified: Secondary | ICD-10-CM | POA: Diagnosis present

## 2020-10-12 DIAGNOSIS — Z823 Family history of stroke: Secondary | ICD-10-CM | POA: Diagnosis not present

## 2020-10-12 DIAGNOSIS — T1490XS Injury, unspecified, sequela: Secondary | ICD-10-CM | POA: Diagnosis not present

## 2020-10-12 DIAGNOSIS — D509 Iron deficiency anemia, unspecified: Secondary | ICD-10-CM | POA: Diagnosis present

## 2020-10-12 DIAGNOSIS — E860 Dehydration: Secondary | ICD-10-CM | POA: Diagnosis present

## 2020-10-12 DIAGNOSIS — D259 Leiomyoma of uterus, unspecified: Secondary | ICD-10-CM | POA: Diagnosis present

## 2020-10-12 DIAGNOSIS — S14109S Unspecified injury at unspecified level of cervical spinal cord, sequela: Secondary | ICD-10-CM | POA: Diagnosis not present

## 2020-10-12 DIAGNOSIS — B961 Klebsiella pneumoniae [K. pneumoniae] as the cause of diseases classified elsewhere: Secondary | ICD-10-CM | POA: Diagnosis present

## 2020-10-12 DIAGNOSIS — Z20822 Contact with and (suspected) exposure to covid-19: Secondary | ICD-10-CM | POA: Diagnosis present

## 2020-10-12 DIAGNOSIS — N390 Urinary tract infection, site not specified: Secondary | ICD-10-CM | POA: Diagnosis present

## 2020-10-12 DIAGNOSIS — N3000 Acute cystitis without hematuria: Secondary | ICD-10-CM | POA: Diagnosis present

## 2020-10-12 DIAGNOSIS — I959 Hypotension, unspecified: Secondary | ICD-10-CM | POA: Diagnosis present

## 2020-10-12 DIAGNOSIS — T83511A Infection and inflammatory reaction due to indwelling urethral catheter, initial encounter: Secondary | ICD-10-CM | POA: Diagnosis present

## 2020-10-12 DIAGNOSIS — Z87440 Personal history of urinary (tract) infections: Secondary | ICD-10-CM | POA: Diagnosis not present

## 2020-10-12 DIAGNOSIS — Z91040 Latex allergy status: Secondary | ICD-10-CM | POA: Diagnosis not present

## 2020-10-12 DIAGNOSIS — N920 Excessive and frequent menstruation with regular cycle: Secondary | ICD-10-CM | POA: Diagnosis present

## 2020-10-12 LAB — IRON AND TIBC
Iron: 15 ug/dL — ABNORMAL LOW (ref 28–170)
Saturation Ratios: 4 % — ABNORMAL LOW (ref 10.4–31.8)
TIBC: 364 ug/dL (ref 250–450)
UIBC: 349 ug/dL

## 2020-10-12 LAB — CBC WITH DIFFERENTIAL/PLATELET
Abs Immature Granulocytes: NONE SEEN 10*3/uL (ref 0.00–0.07)
Abs Immature Granulocytes: 0.02 10*3/uL (ref 0.00–0.07)
Band Neutrophils: 0 %
Basophils Absolute: 0 10*3/uL (ref 0.0–0.1)
Basophils Relative: 1 %
Basophils Relative: 0 %
Blasts: NONE SEEN %
Eosinophils Absolute: 0.3 10*3/uL (ref 0.0–0.5)
Eosinophils Relative: 0 %
Eosinophils Relative: 3 %
HCT: 21.4 % — ABNORMAL LOW (ref 36.0–46.0)
HCT: 23.7 % — ABNORMAL LOW (ref 36.0–46.0)
Hemoglobin: 5.8 g/dL — CL (ref 12.0–15.0)
Hemoglobin: 6.6 g/dL — CL (ref 12.0–15.0)
Immature Granulocytes: NONE SEEN %
Immature Granulocytes: 0 %
Lymphocytes Relative: 50 %
Lymphocytes Relative: 46 %
Lymphs Abs: 3.9 10*3/uL (ref 0.7–4.0)
MCH: 17.1 pg — ABNORMAL LOW (ref 26.0–34.0)
MCH: 18.7 pg — ABNORMAL LOW (ref 26.0–34.0)
MCHC: 27.1 g/dL — ABNORMAL LOW (ref 30.0–36.0)
MCHC: 27.8 g/dL — ABNORMAL LOW (ref 30.0–36.0)
MCV: 62.9 fL — ABNORMAL LOW (ref 80.0–100.0)
MCV: 67.1 fL — ABNORMAL LOW (ref 80.0–100.0)
Metamyelocytes Relative: NONE SEEN %
Monocytes Absolute: 0.5 10*3/uL (ref 0.1–1.0)
Monocytes Relative: 1 %
Monocytes Relative: 6 %
Myelocytes: NONE SEEN %
Neutro Abs: 3.8 10*3/uL (ref 1.7–7.7)
Neutrophils Relative %: 48 %
Neutrophils Relative %: 45 %
Platelets: 600 10*3/uL — ABNORMAL HIGH (ref 150–400)
Platelets: 572 10*3/uL — ABNORMAL HIGH (ref 150–400)
Promyelocytes Relative: NONE SEEN %
RBC: 3.4 MIL/uL — ABNORMAL LOW (ref 3.87–5.11)
RBC: 3.53 MIL/uL — ABNORMAL LOW (ref 3.87–5.11)
RDW: 22.7 % — ABNORMAL HIGH (ref 11.5–15.5)
RDW: 25.4 % — ABNORMAL HIGH (ref 11.5–15.5)
WBC Morphology: NORMAL
WBC: 9.4 10*3/uL (ref 4.0–10.5)
WBC: 8.4 10*3/uL (ref 4.0–10.5)
nRBC: 0 % (ref 0.0–0.2)
nRBC: 1 /100 WBC — ABNORMAL HIGH
nRBC: 0 % (ref 0.0–0.2)

## 2020-10-12 LAB — I-STAT CHEM 8, ED
BUN: 7 mg/dL (ref 6–20)
Calcium, Ion: 1.18 mmol/L (ref 1.15–1.40)
Chloride: 103 mmol/L (ref 98–111)
Creatinine, Ser: 0.6 mg/dL (ref 0.44–1.00)
Glucose, Bld: 96 mg/dL (ref 70–99)
HCT: 20 % — ABNORMAL LOW (ref 36.0–46.0)
Hemoglobin: 6.8 g/dL — CL (ref 12.0–15.0)
Potassium: 3.6 mmol/L (ref 3.5–5.1)
Sodium: 139 mmol/L (ref 135–145)
TCO2: 23 mmol/L (ref 22–32)

## 2020-10-12 LAB — URINALYSIS, ROUTINE W REFLEX MICROSCOPIC
Bilirubin Urine: NEGATIVE
Glucose, UA: NEGATIVE mg/dL
Hgb urine dipstick: NEGATIVE
Ketones, ur: 5 mg/dL — AB
Nitrite: POSITIVE — AB
Protein, ur: 100 mg/dL — AB
Specific Gravity, Urine: 1.025 (ref 1.005–1.030)
WBC, UA: >50 WBC/hpf — ABNORMAL HIGH (ref 0–5)
pH: 6 (ref 5.0–8.0)

## 2020-10-12 LAB — COMPREHENSIVE METABOLIC PANEL
ALT: 15 U/L (ref 0–44)
ALT: 13 U/L (ref 0–44)
AST: 19 U/L (ref 15–41)
AST: 17 U/L (ref 15–41)
Albumin: 3.3 g/dL — ABNORMAL LOW (ref 3.5–5.0)
Albumin: 3 g/dL — ABNORMAL LOW (ref 3.5–5.0)
Alkaline Phosphatase: 71 U/L (ref 38–126)
Alkaline Phosphatase: 65 U/L (ref 38–126)
Anion gap: 9 (ref 5–15)
Anion gap: 10 (ref 5–15)
BUN: 9 mg/dL (ref 6–20)
BUN: 8 mg/dL (ref 6–20)
CO2: 21 mmol/L — ABNORMAL LOW (ref 22–32)
CO2: 20 mmol/L — ABNORMAL LOW (ref 22–32)
Calcium: 8.7 mg/dL — ABNORMAL LOW (ref 8.9–10.3)
Calcium: 8.2 mg/dL — ABNORMAL LOW (ref 8.9–10.3)
Chloride: 105 mmol/L (ref 98–111)
Chloride: 109 mmol/L (ref 98–111)
Creatinine, Ser: 0.66 mg/dL (ref 0.44–1.00)
Creatinine, Ser: 0.48 mg/dL (ref 0.44–1.00)
GFR, Estimated: >60 mL/min (ref >60)
GFR, Estimated: >60 mL/min (ref >60)
Glucose, Bld: 100 mg/dL — ABNORMAL HIGH (ref 70–99)
Glucose, Bld: 100 mg/dL — ABNORMAL HIGH (ref 70–99)
Potassium: 3.5 mmol/L (ref 3.5–5.1)
Potassium: 3.6 mmol/L (ref 3.5–5.1)
Sodium: 135 mmol/L (ref 135–145)
Sodium: 139 mmol/L (ref 135–145)
Total Bilirubin: 0.2 mg/dL — ABNORMAL LOW (ref 0.3–1.2)
Total Bilirubin: 0.4 mg/dL (ref 0.3–1.2)
Total Protein: 7 g/dL (ref 6.5–8.1)
Total Protein: 6.4 g/dL — ABNORMAL LOW (ref 6.5–8.1)

## 2020-10-12 LAB — PROTIME-INR
INR: 1.1 (ref 0.8–1.2)
INR: 1.1 (ref 0.8–1.2)
Prothrombin Time: 14.1 seconds (ref 11.4–15.2)
Prothrombin Time: 14.5 seconds (ref 11.4–15.2)

## 2020-10-12 LAB — RETICULOCYTES
Immature Retic Fract: 22 % — ABNORMAL HIGH (ref 2.3–15.9)
RBC.: 3.35 MIL/uL — ABNORMAL LOW (ref 3.87–5.11)
Retic Count, Absolute: 49.2 10*3/uL (ref 19.0–186.0)
Retic Ct Pct: 1.5 % (ref 0.4–3.1)

## 2020-10-12 LAB — FOLATE: Folate: 7.2 ng/mL (ref >5.9)

## 2020-10-12 LAB — MAGNESIUM
Magnesium: 1.9 mg/dL (ref 1.7–2.4)
Magnesium: 2 mg/dL (ref 1.7–2.4)

## 2020-10-12 LAB — HIV ANTIBODY (ROUTINE TESTING W REFLEX): HIV Screen 4th Generation wRfx: NONREACTIVE

## 2020-10-12 LAB — HEMOGLOBIN AND HEMATOCRIT, BLOOD
HCT: 30.4 % — ABNORMAL LOW (ref 36.0–46.0)
HCT: 30.2 % — ABNORMAL LOW (ref 36.0–46.0)
Hemoglobin: 9 g/dL — ABNORMAL LOW (ref 12.0–15.0)
Hemoglobin: 9 g/dL — ABNORMAL LOW (ref 12.0–15.0)

## 2020-10-12 LAB — RESP PANEL BY RT-PCR (FLU A&B, COVID) ARPGX2
Influenza A by PCR: NEGATIVE
Influenza B by PCR: NEGATIVE
SARS Coronavirus 2 by RT PCR: NEGATIVE

## 2020-10-12 LAB — LACTATE DEHYDROGENASE: LDH: 128 U/L (ref 98–192)

## 2020-10-12 LAB — PHOSPHORUS: Phosphorus: 3.2 mg/dL (ref 2.5–4.6)

## 2020-10-12 LAB — PREPARE RBC (CROSSMATCH)

## 2020-10-12 LAB — LACTIC ACID, PLASMA
Lactic Acid, Venous: 1.4 mmol/L (ref 0.5–1.9)
Lactic Acid, Venous: 1 mmol/L (ref 0.5–1.9)

## 2020-10-12 LAB — FERRITIN: Ferritin: 3 ng/mL — ABNORMAL LOW (ref 11–307)

## 2020-10-12 LAB — LIPASE, BLOOD: Lipase: 21 U/L (ref 11–51)

## 2020-10-12 LAB — VITAMIN B12: Vitamin B-12: 216 pg/mL (ref 180–914)

## 2020-10-12 LAB — APTT: aPTT: 32 seconds (ref 24–36)

## 2020-10-12 MED ORDER — LORAZEPAM 2 MG/ML IJ SOLN
0.5000 mg | Freq: Four times a day (QID) | INTRAMUSCULAR | Status: DC | PRN
  Administered 2020-10-12 – 2020-10-14 (×2): 0.5 mg via INTRAVENOUS
  Filled 2020-10-12 (×2): qty 1

## 2020-10-12 MED ORDER — FENTANYL CITRATE (PF) 100 MCG/2ML IJ SOLN
50.0000 ug | Freq: Once | INTRAMUSCULAR | Status: AC
  Administered 2020-10-12: 50 ug via INTRAVENOUS
  Filled 2020-10-12: qty 2

## 2020-10-12 MED ORDER — DIAZEPAM 2 MG PO TABS
2.0000 mg | ORAL_TABLET | Freq: Two times a day (BID) | ORAL | Status: DC | PRN
  Administered 2020-10-13 – 2020-10-15 (×4): 2 mg via ORAL
  Filled 2020-10-12 (×4): qty 1

## 2020-10-12 MED ORDER — HYDROMORPHONE HCL 1 MG/ML IJ SOLN
1.0000 mg | INTRAMUSCULAR | Status: DC | PRN
  Administered 2020-10-12 – 2020-10-15 (×14): 1 mg via INTRAVENOUS
  Filled 2020-10-12 (×15): qty 1

## 2020-10-12 MED ORDER — ACETAMINOPHEN 650 MG RE SUPP: 650.0000 mg | Freq: Four times a day (QID) | RECTAL | Status: DC | PRN

## 2020-10-12 MED ORDER — TRAMADOL HCL 50 MG PO TABS
50.0000 mg | ORAL_TABLET | Freq: Four times a day (QID) | ORAL | Status: DC | PRN
Start: 2020-10-12 — End: 2020-10-15
  Administered 2020-10-13 – 2020-10-14 (×2): 50 mg via ORAL
  Filled 2020-10-12 (×2): qty 1

## 2020-10-12 MED ORDER — TIZANIDINE HCL 4 MG PO TABS
4.0000 mg | ORAL_TABLET | Freq: Three times a day (TID) | ORAL | Status: DC
  Administered 2020-10-12 – 2020-10-15 (×9): 4 mg via ORAL
  Filled 2020-10-12 (×9): qty 1

## 2020-10-12 MED ORDER — METHOCARBAMOL 1000 MG/10ML IJ SOLN
1000.0000 mg | Freq: Once | INTRAVENOUS | Status: AC
  Administered 2020-10-12: 1000 mg via INTRAVENOUS
  Filled 2020-10-12: qty 10

## 2020-10-12 MED ORDER — TIZANIDINE HCL 4 MG PO TABS: 4.0000 mg | ORAL_TABLET | Freq: Three times a day (TID) | ORAL | Status: DC | PRN

## 2020-10-12 MED ORDER — METHOCARBAMOL 1000 MG/10ML IJ SOLN: 1000.0000 mg | Freq: Once | INTRAMUSCULAR | Status: DC

## 2020-10-12 MED ORDER — SODIUM CHLORIDE 0.9 % IV SOLN
1.0000 g | INTRAVENOUS | Status: DC
  Administered 2020-10-12 – 2020-10-13 (×2): 1 g via INTRAVENOUS
  Filled 2020-10-12: qty 10
  Filled 2020-10-12: qty 1
  Filled 2020-10-12: qty 10

## 2020-10-12 MED ORDER — TOPIRAMATE 25 MG PO TABS
25.0000 mg | ORAL_TABLET | Freq: Every day | ORAL | Status: DC
  Administered 2020-10-12 – 2020-10-14 (×3): 25 mg via ORAL
  Filled 2020-10-12 (×3): qty 1

## 2020-10-12 MED ORDER — HYDROCODONE-ACETAMINOPHEN 5-325 MG PO TABS
1.0000 | ORAL_TABLET | Freq: Four times a day (QID) | ORAL | Status: DC | PRN
  Administered 2020-10-12 – 2020-10-14 (×5): 1 via ORAL
  Filled 2020-10-12 (×5): qty 1

## 2020-10-12 MED ORDER — SODIUM CHLORIDE 0.9% IV SOLUTION
Freq: Once | INTRAVENOUS | Status: AC
Start: 2020-10-12 — End: 2020-10-12

## 2020-10-12 MED ORDER — CHLORHEXIDINE GLUCONATE CLOTH 2 % EX PADS
6.0000 | MEDICATED_PAD | Freq: Every day | CUTANEOUS | Status: DC
  Administered 2020-10-12 – 2020-10-15 (×4): 6 via TOPICAL

## 2020-10-12 MED ORDER — ACETAMINOPHEN 325 MG PO TABS
650.0000 mg | ORAL_TABLET | Freq: Four times a day (QID) | ORAL | Status: DC | PRN
  Administered 2020-10-12 – 2020-10-13 (×3): 650 mg via ORAL
  Filled 2020-10-12 (×3): qty 2

## 2020-10-12 MED ORDER — FENTANYL CITRATE (PF) 100 MCG/2ML IJ SOLN
25.0000 ug | INTRAMUSCULAR | Status: DC | PRN
  Administered 2020-10-12 (×2): 25 ug via INTRAVENOUS
  Filled 2020-10-12 (×2): qty 2

## 2020-10-12 MED ORDER — DULOXETINE HCL 60 MG PO CPEP
60.0000 mg | ORAL_CAPSULE | Freq: Every day | ORAL | Status: DC
  Administered 2020-10-12 – 2020-10-15 (×4): 60 mg via ORAL
  Filled 2020-10-12 (×2): qty 1
  Filled 2020-10-12: qty 2
  Filled 2020-10-12: qty 1

## 2020-10-12 MED ORDER — ONDANSETRON HCL 4 MG/2ML IJ SOLN
4.0000 mg | Freq: Once | INTRAMUSCULAR | Status: AC
  Administered 2020-10-12: 4 mg via INTRAVENOUS
  Filled 2020-10-12: qty 2

## 2020-10-12 MED ORDER — PHENTERMINE HCL 15 MG PO CAPS: 15.0000 mg | ORAL_CAPSULE | Freq: Every day | ORAL | Status: DC

## 2020-10-12 MED ORDER — TRAZODONE HCL 50 MG PO TABS: 50.0000 mg | ORAL_TABLET | Freq: Every evening | ORAL | Status: DC | PRN

## 2020-10-12 MED ORDER — IOHEXOL 300 MG/ML  SOLN
100.0000 mL | Freq: Once | INTRAMUSCULAR | Status: AC | PRN
  Administered 2020-10-12: 100 mL via INTRAVENOUS

## 2020-10-12 NOTE — ED Notes (Signed)
Called lab to add on blood work 

## 2020-10-12 NOTE — ED Notes (Signed)
Lunch tray delivered.

## 2020-10-12 NOTE — Progress Notes (Signed)
Per pt no BM since Monday. Digital stimulation performed as pt has done at home. Small smear of liquid stool and passing gas.
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
## 2020-10-12 NOTE — ED Notes (Signed)
Patient reports pain is uncontrolled by current medication. Writer will notify MD

## 2020-10-12 NOTE — ED Notes (Signed)
Patient stated she wanted to go home if all she was going to do is lay in bed and take home medications. Patient states she can do this at home. This nurse notified provider MD Laxman. Laxman advised if patient wants to leave patient must sign AMA. This Probation officer notified patient. Order was placed for dilaudid. Writer gave patient 1mg  dilaudid as ordered for pain of 8/10 "all over". Will reassess pain in thirty minutes
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
## 2020-10-12 NOTE — ED Notes (Signed)
Patient provided with breakfast tray at this time.

## 2020-10-12 NOTE — ED Notes (Signed)
Patient transported to CT 

## 2020-10-13 DIAGNOSIS — E860 Dehydration: Secondary | ICD-10-CM | POA: Diagnosis not present

## 2020-10-13 DIAGNOSIS — R1030 Lower abdominal pain, unspecified: Secondary | ICD-10-CM | POA: Diagnosis not present

## 2020-10-13 DIAGNOSIS — D649 Anemia, unspecified: Secondary | ICD-10-CM | POA: Diagnosis not present

## 2020-10-13 DIAGNOSIS — N3000 Acute cystitis without hematuria: Secondary | ICD-10-CM | POA: Diagnosis not present

## 2020-10-13 LAB — COMPREHENSIVE METABOLIC PANEL
ALT: 12 U/L (ref 0–44)
AST: 15 U/L (ref 15–41)
Albumin: 3 g/dL — ABNORMAL LOW (ref 3.5–5.0)
Alkaline Phosphatase: 74 U/L (ref 38–126)
Anion gap: 6 (ref 5–15)
BUN: 7 mg/dL (ref 6–20)
CO2: 24 mmol/L (ref 22–32)
Calcium: 8.4 mg/dL — ABNORMAL LOW (ref 8.9–10.3)
Chloride: 105 mmol/L (ref 98–111)
Creatinine, Ser: 0.51 mg/dL (ref 0.44–1.00)
GFR, Estimated: >60 mL/min (ref >60)
Glucose, Bld: 128 mg/dL — ABNORMAL HIGH (ref 70–99)
Potassium: 3.5 mmol/L (ref 3.5–5.1)
Sodium: 135 mmol/L (ref 135–145)
Total Bilirubin: 0.2 mg/dL — ABNORMAL LOW (ref 0.3–1.2)
Total Protein: 6.4 g/dL — ABNORMAL LOW (ref 6.5–8.1)

## 2020-10-13 LAB — HAPTOGLOBIN: Haptoglobin: 152 mg/dL (ref 33–278)

## 2020-10-13 LAB — TYPE AND SCREEN
ABO/RH(D): O POS
Antibody Screen: NEGATIVE
Unit division: 0
Unit division: 0

## 2020-10-13 LAB — MAGNESIUM: Magnesium: 1.9 mg/dL (ref 1.7–2.4)

## 2020-10-13 LAB — CBC
HCT: 29.2 % — ABNORMAL LOW (ref 36.0–46.0)
Hemoglobin: 8.4 g/dL — ABNORMAL LOW (ref 12.0–15.0)
MCH: 20.4 pg — ABNORMAL LOW (ref 26.0–34.0)
MCHC: 28.8 g/dL — ABNORMAL LOW (ref 30.0–36.0)
MCV: 70.9 fL — ABNORMAL LOW (ref 80.0–100.0)
Platelets: 481 10*3/uL — ABNORMAL HIGH (ref 150–400)
RBC: 4.12 MIL/uL (ref 3.87–5.11)
RDW: 26 % — ABNORMAL HIGH (ref 11.5–15.5)
WBC: 9.8 10*3/uL (ref 4.0–10.5)
nRBC: 0 % (ref 0.0–0.2)

## 2020-10-13 LAB — BPAM RBC
Blood Product Expiration Date: 202205182359
Blood Product Expiration Date: 202205182359
ISSUE DATE / TIME: 202204230135
ISSUE DATE / TIME: 202204230503
Unit Type and Rh: 5100
Unit Type and Rh: 5100

## 2020-10-13 LAB — PHOSPHORUS: Phosphorus: 3.2 mg/dL (ref 2.5–4.6)

## 2020-10-13 MED ORDER — FLEET ENEMA 7-19 GM/118ML RE ENEM
1.0000 | ENEMA | Freq: Once | RECTAL | Status: AC
  Administered 2020-10-14: 1 via RECTAL
  Filled 2020-10-13: qty 1

## 2020-10-13 MED ORDER — ENOXAPARIN SODIUM 60 MG/0.6ML ~~LOC~~ SOLN
50.0000 mg | Freq: Every day | SUBCUTANEOUS | Status: DC
  Filled 2020-10-13: qty 0.6

## 2020-10-13 MED ORDER — SODIUM CHLORIDE 0.9 % IV SOLN
1000.0000 mL | INTRAVENOUS | Status: DC
  Administered 2020-10-13 – 2020-10-15 (×4): 1000 mL via INTRAVENOUS

## 2020-10-13 MED ORDER — DOCUSATE SODIUM 100 MG PO CAPS
100.0000 mg | ORAL_CAPSULE | Freq: Two times a day (BID) | ORAL | Status: DC
  Filled 2020-10-13: qty 1

## 2020-10-13 MED ORDER — POLYETHYLENE GLYCOL 3350 17 G PO PACK: 17.0000 g | PACK | Freq: Two times a day (BID) | ORAL | Status: DC
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
## 2020-10-14 ENCOUNTER — Inpatient Hospital Stay (HOSPITAL_COMMUNITY): Payer: Medicare Other

## 2020-10-14 DIAGNOSIS — D649 Anemia, unspecified: Secondary | ICD-10-CM | POA: Diagnosis not present

## 2020-10-14 DIAGNOSIS — E860 Dehydration: Secondary | ICD-10-CM | POA: Diagnosis not present

## 2020-10-14 DIAGNOSIS — R1030 Lower abdominal pain, unspecified: Secondary | ICD-10-CM | POA: Diagnosis not present

## 2020-10-14 DIAGNOSIS — N3000 Acute cystitis without hematuria: Secondary | ICD-10-CM | POA: Diagnosis not present

## 2020-10-14 LAB — URINE CULTURE: Culture: >=100000 — AB

## 2020-10-14 LAB — BASIC METABOLIC PANEL
Anion gap: 6 (ref 5–15)
BUN: 11 mg/dL (ref 6–20)
CO2: 24 mmol/L (ref 22–32)
Calcium: 8.6 mg/dL — ABNORMAL LOW (ref 8.9–10.3)
Chloride: 110 mmol/L (ref 98–111)
Creatinine, Ser: 0.38 mg/dL — ABNORMAL LOW (ref 0.44–1.00)
GFR, Estimated: >60 mL/min (ref >60)
Glucose, Bld: 98 mg/dL (ref 70–99)
Potassium: 3.5 mmol/L (ref 3.5–5.1)
Sodium: 140 mmol/L (ref 135–145)

## 2020-10-14 LAB — CBC
HCT: 29.4 % — ABNORMAL LOW (ref 36.0–46.0)
Hemoglobin: 8.6 g/dL — ABNORMAL LOW (ref 12.0–15.0)
MCH: 20.8 pg — ABNORMAL LOW (ref 26.0–34.0)
MCHC: 29.3 g/dL — ABNORMAL LOW (ref 30.0–36.0)
MCV: 71 fL — ABNORMAL LOW (ref 80.0–100.0)
Platelets: 483 10*3/uL — ABNORMAL HIGH (ref 150–400)
RBC: 4.14 MIL/uL (ref 3.87–5.11)
RDW: 26.6 % — ABNORMAL HIGH (ref 11.5–15.5)
WBC: 8.9 10*3/uL (ref 4.0–10.5)
nRBC: 0 % (ref 0.0–0.2)

## 2020-10-14 LAB — MAGNESIUM: Magnesium: 1.9 mg/dL (ref 1.7–2.4)

## 2020-10-14 LAB — PHOSPHORUS: Phosphorus: 3.5 mg/dL (ref 2.5–4.6)

## 2020-10-14 MED ORDER — CEPHALEXIN 500 MG PO CAPS
500.0000 mg | ORAL_CAPSULE | Freq: Two times a day (BID) | ORAL | Status: DC
  Administered 2020-10-14 – 2020-10-15 (×2): 500 mg via ORAL
  Filled 2020-10-14 (×2): qty 1

## 2020-10-14 MED ORDER — HYDROCODONE-ACETAMINOPHEN 5-325 MG PO TABS
2.0000 | ORAL_TABLET | Freq: Four times a day (QID) | ORAL | Status: DC | PRN
  Administered 2020-10-15: 2 via ORAL
  Filled 2020-10-14 (×2): qty 2

## 2020-10-14 NOTE — Progress Notes (Signed)
PROGRESS NOTE  Tracey Hall VHQ:[id number] DOB: March 20, 1981 DOA: 10/11/2020 PCP: Antony Blackbird, MD (Inactive)   LOS: 2 days   Brief narrative:  Tracey Hall is a 40 y.o. 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   Patient does have history of recurrent UTI in the past and recent 1 with E. coli in 2019.  She was also noted to be anemic in the ED and blood transfusion was given.    Initial vitals in the ED, showed temperature of 100.6 F and mild hypotension which improved with IV fluids.  Urinalysis shows greater than 50 white blood cells, moderate leukocyte esterase, nitrate positive, no squamous epithelial cells, specific gravity 1.025, and was positive for hyaline cast.    Blood cultures and urine cultures were sent from the ED. Chest x-ray and EKG was unremarkable CT of the abdomen and pelvis showed no acute findings.  Patient was then admitted to hospital urinary tract infection.  Assessment/Plan:  Principal Problem:   Acute cystitis Active Problems:   Acute on chronic anemia   Nausea   Abdominal pain   Dehydration   Hypotension  Acute cystitis: With history of neurogenic bladder and chronic indwelling Foley catheter.  History of recurrent urinary tract infection.  Negative blood cultures  so far.  Urine cultures showing more than 100,000 colony of Klebsiella pneumoniae. Pending final culture and sensitivity.  History of E. coli UTI in the past.  Continue IV Rocephin.  Temperature Max of 98 F.  Acute on chronic iron deficiency anemia:    Hemoglobin of 6.8 on presentation.  Received 2 units of packed RBC.  Hemoglobin of 8.6 at this time.  Patient does have history of  menorrhagia.  No history of rectal bleed.  Hypotension:  Has resolved at this time.    Abdominal pain: CT abdomen pelvis with no acute abdominal process of some fibroids..  Has history of constipation and undergoes disimpaction.  She did have a bowel movement after disimpaction.  Abdominal exam is benign but she is extremely tender on even minimal touching of the skin. We will continue with laxatives while in the hospital.  Patient expresses strong pain and need for medication and is currently on Dilaudid for pain.    I spoke with the GYN on-call today to discuss about fibroids seen on the CT scan.  There was no mention of specific size but some enhancement in the uterus.   He recommended pelvic ultrasound to assess for fibroids, urine for chlamydia and gonorrhea.  Pregnancy test is negative.  Constipation.  Patient requires disimpaction periodically.  Continue MiraLAX and docusate.  As needed disimpaction.  Neurogenic bladder: As a consequence of history of traumatic spinal cord injury resulting in incomplete quadriplegia,and prompting chronic indwelling Foley catheter.  DVT prophylaxis: SCDs Start: 10/12/20 0317, Lovenox subcu   Code Status: Full code  Family Communication:  None today.  Status is: Inpatient  Remains inpatient appropriate because:IV treatments appropriate due to intensity of illness or inability to take PO and Inpatient level of care appropriate due to severity of illness follow culture and sensitivity, pelvic ultrasound,  GYN evaluation   Dispo: The patient is from: Home              Anticipated d/c  is to: Home              Patient currently is not medically stable to d/c.   Difficult to place patient No   Consultants:  None  Procedures:  PRBC transfusion 2 units  Anti-infectives:  Marland Kitchen Rocephin IV 10/11/2020>  Anti-infectives (From admission, onward)   Start     Dose/Rate Route Frequency Ordered Stop   10/14/20 2000  cephALEXin (KEFLEX) capsule  500 mg        500 mg Oral Every 12 hours 10/14/20 1500     10/12/20 2000  cefTRIAXone (ROCEPHIN) 1 g in sodium chloride 0.9 % 100 mL IVPB  Status:  Discontinued        1 g 200 mL/hr over 30 Minutes Intravenous Every 24 hours 10/12/20 0341 10/14/20 1500   10/11/20 2315  cefTRIAXone (ROCEPHIN) 1 g in sodium chloride 0.9 % 100 mL IVPB        1 g 200 mL/hr over 30 Minutes Intravenous  Once 10/11/20 2309 10/12/20 0005      Subjective: Today, patient was seen and examined at bedside.  Still complains of severe lower abdominal pain but was sleeping when I woke her up.  Denies any nausea vomiting fever or chills.  Had bowel movement after disimpaction.  Objective: Vitals:   10/13/20 2027 10/14/20 0356  BP: 114/72 118/76  Pulse: 75 78  Resp: 18 20  Temp: 98.5 F (36.9 C) 98 F (36.7 C)  SpO2: 94% 99%    Intake/Output Summary (Last 24 hours) at 10/14/2020 1810 Last data filed at 10/14/2020 0400 Gross per 24 hour  Intake 1757.06 ml  Output 1000 ml  Net 757.06 ml   Filed Weights   10/12/20 1833 10/13/20 0559  Weight: 96.8 kg 97.8 kg   Body mass index is 35.88 kg/m.   Physical Exam:  General: Obese built,, not in obvious distress HENT:   No scleral pallor or icterus noted. Oral mucosa is moist.  Chest:  Clear breath sounds.  Diminished breath sounds bilaterally. No crackles or wheezes.  CVS: S1 &S2 heard. No murmur.  Regular rate and rhythm. Abdomen: Soft, nontender, nondistended.  Bowel sounds are heard.  Urethral catheter in place. Extremities: No cyanosis, clubbing or edema.  Peripheral pulses are palpable. Psych: Alert, awake and oriented, normal mood CNS:  No cranial nerve deficits.  Bilateral lower extremity weakness noted.  Mild upper extremity weakness. Skin: Warm and dry.    Data Review: I have personally reviewed the following laboratory data and studies,  CBC: Recent Labs  Lab 10/11/20 2308 10/12/20 0004 10/12/20 0428 10/12/20 0920 10/12/20 1436  10/13/20 0543 10/14/20 0537  WBC 9.4  --  8.4  --   --  9.8 8.9  NEUTROABS  --   --  3.8  --   --   --   --   HGB 5.8*   < > 6.6* 9.0* 9.0* 8.4* 8.6*  HCT 21.4*   < > 23.7* 30.4* 30.2* 29.2* 29.4*  MCV 62.9*  --  67.1*  --   --  70.9* 71.0*  PLT 600*  --  572*  --   --  481* 483*   < > = values in this interval not displayed.   Basic Metabolic Panel: Recent Labs  Lab 10/11/20 2308 10/12/20 0004 10/12/20 0325 10/12/20 0428 10/13/20 0543 10/14/20 0537  NA 135 139  --  139 135 140  K 3.5 3.6  --  3.6 3.5 3.5  CL 105 103  --  109 105 110  CO2 21*  --   --  20* 24 24  GLUCOSE 100* 96  --  100* 128* 98  BUN 9 7  --  8 7 11   CREATININE 0.66 0.60  --  0.48 0.51 0.38*  CALCIUM 8.7*  --   --  8.2* 8.4* 8.6*  MG  --   --  1.9 2.0 1.9 1.9  PHOS  --   --   --  3.2 3.2 3.5   Liver Function Tests: Recent Labs  Lab 10/11/20 2308 10/12/20 0428 10/13/20 0543  AST 19 17 15   ALT 15 13 12   ALKPHOS 71 65 74  BILITOT 0.2* 0.4 0.2*  PROT 7.0 6.4* 6.4*  ALBUMIN 3.3* 3.0* 3.0*   Recent Labs  Lab 10/11/20 2308  LIPASE 21   No results for input(s): AMMONIA in the last 168 hours. Cardiac Enzymes: No results for input(s): CKTOTAL, CKMB, CKMBINDEX, TROPONINI in the last 168 hours. BNP (last 3 results) No results for input(s): BNP in the last 8760 hours.  ProBNP (last 3 results) No results for input(s): PROBNP in the last 8760 hours.  CBG: No results for input(s): GLUCAP in the last 168 hours. Recent Results (from the past 240 hour(s))  Blood culture (routine single)     Status: None (Preliminary result)   Collection Time: 10/11/20 11:08 PM   Specimen: BLOOD  Result Value Ref Range Status   Specimen Description   Final    BLOOD RIGHT ANTECUBITAL Performed at [facility] [address] [place], [place] [address]    Special Requests   Final    BOTTLES DRAWN AEROBIC AND ANAEROBIC Blood Culture results may not be optimal due to an inadequate volume of blood  received in culture bottles Performed at [facility] [address], [place], [place] [address]    Culture   Final    NO GROWTH 2 DAYS Performed at [facility] [address], [place], [place] [address]    Report Status PENDING  Incomplete  Urine culture     Status: Abnormal   Collection Time: 10/11/20 11:40 PM   Specimen: Urine, Random  Result Value Ref Range Status   Specimen Description   Final    URINE, RANDOM Performed at [facility] [address], [place],  [address]    Special Requests   Final    NONE Performed at [facility], [place] [address] [place], [place] [address]    Culture >=100,000 COLONIES/mL KLEBSIELLA PNEUMONIAE (A)  Final   Report Status 10/14/2020 FINAL  Final   Organism ID, Bacteria KLEBSIELLA PNEUMONIAE (A)  Final      Susceptibility   Klebsiella pneumoniae - MIC*    AMPICILLIN >=32 RESISTANT Resistant     CEFAZOLIN <=4 SENSITIVE Sensitive     CEFEPIME <=0.12 SENSITIVE Sensitive     CEFTRIAXONE <=0.25 SENSITIVE Sensitive     CIPROFLOXACIN <=0.25 SENSITIVE Sensitive     GENTAMICIN <=1 SENSITIVE Sensitive     IMIPENEM <=0.25 SENSITIVE Sensitive     NITROFURANTOIN 64 INTERMEDIATE Intermediate     TRIMETH/SULFA <=20 SENSITIVE Sensitive     AMPICILLIN/SULBACTAM 8 SENSITIVE Sensitive     PIP/TAZO <=4 SENSITIVE Sensitive     * >=100,000 COLONIES/mL KLEBSIELLA PNEUMONIAE  Resp Panel by RT-PCR (Flu A&B, Covid) Nasopharyngeal Swab     Status: None   Collection Time: 10/12/20 12:21 AM   Specimen: Nasopharyngeal Swab; Nasopharyngeal(NP) swabs in vial transport  medium  Result Value Ref Range Status   SARS Coronavirus 2 by RT PCR NEGATIVE NEGATIVE Final    Comment: (NOTE) SARS-CoV-2 target nucleic acids are NOT DETECTED.  The SARS-CoV-2 RNA is generally detectable in upper respiratory specimens during the acute phase of infection. The lowest concentration of SARS-CoV-2 viral copies  this assay can detect is 138 copies/mL. A negative result does not preclude SARS-Cov-2 infection and should not be used as the sole basis for treatment or other patient management decisions. A negative result may occur with  improper specimen collection/handling, submission of specimen other than nasopharyngeal swab, presence of viral mutation(s) within the areas targeted by this assay, and inadequate number of viral copies(<138 copies/mL). A negative result must be combined with clinical observations, patient history, and epidemiological information. The expected result is Negative.  Fact Sheet for Patients:  EntrepreneurPulse.com.au  Fact Sheet for Healthcare Providers:  IncredibleEmployment.be  This test is no t yet approved or cleared by the [place] FDA and  has been authorized for detection and/or diagnosis of SARS-CoV-2 by FDA under an Emergency Use Authorization (EUA). This EUA will remain  in effect (meaning this test can be used) for the duration of the COVID-19 declaration under Section 564(b)(1) of the Act, 21 U.S.C.section 360bbb-3(b)(1), unless the authorization is terminated  or revoked sooner.       Influenza A by PCR NEGATIVE NEGATIVE Final   Influenza B by PCR NEGATIVE NEGATIVE Final    Comment: (NOTE) The Xpert Xpress SARS-CoV-2/FLU/RSV plus assay is intended as an aid in the diagnosis of influenza from Nasopharyngeal swab specimens and should not be used as a sole basis for treatment. Nasal washings and aspirates are unacceptable for Xpert Xpress SARS-CoV-2/FLU/RSV testing.  Fact Sheet for Patients: EntrepreneurPulse.com.au  Fact Sheet for Healthcare Providers: IncredibleEmployment.be  This test is not yet approved or cleared by the [place] FDA and has been authorized for detection and/or diagnosis of SARS-CoV-2 by FDA under an Emergency Use Authorization (EUA). This EUA  will remain in effect (meaning this test can be used) for the duration of the COVID-19 declaration under Section 564(b)(1) of the Act, 21 U.S.C. section 360bbb-3(b)(1), unless the authorization is terminated or revoked.  Performed at [facility], [place] [address], [place], [place] [address]   Culture, blood (single)     Status: None (Preliminary result)   Collection Time: 10/12/20 12:22 AM   Specimen: BLOOD LEFT HAND  Result Value Ref Range Status   Specimen Description   Final    BLOOD LEFT HAND Performed at [facility] [address] [place], [place] [address]    Special Requests   Final    BOTTLES DRAWN AEROBIC AND ANAEROBIC Blood Culture adequate volume Performed at [facility] [address] [place], [place] [address]    Culture   Final    NO GROWTH 2 DAYS Performed at [facility] [address] [place], [place] [address]    Report Status PENDING  Incomplete     Studies: US PELVIS (TRANSABDOMINAL ONLY)  Result Date: 10/14/2020 CLINICAL DATA:  Leiomyomatous uterus EXAM: TRANSABDOMINAL ULTRASOUND OF PELVIS TECHNIQUE: Transabdominal ultrasound examination of the pelvis was performed including evaluation of the uterus, ovaries, adnexal regions, and pelvic cul-de-sac. COMPARISON:  CT abdomen and pelvis October 12, 2020. FINDINGS: Uterus Measurements: 10.2 x 6.7 x 9.2 cm = volume: 330 mL. There is a mixed echogenicity mass in the leftward aspect of the uterus measuring 1.5 x 1.3  x 1.8 cm. Toward the right, there is a hypoechoic mass in the mid uterus measuring 2.7 x 2.0 x 2.4 cm. Elsewhere, the echotexture of the uterus is somewhat inhomogeneous. Endometrium Thickness: 13 mm.  No focal abnormality visualized. Right ovary Measurements: 3.5 x 2.1 x 2.9 cm = volume: 11.2 mL. Normal appearance/no adnexal mass. Left ovary Measurements: 4.0 x 1.8 x 3.1 cm = volume: 11.5 mL. Normal appearance/no adnexal mass. Other findings:   Small amount of free pelvic fluid. IMPRESSION: 1. Leiomyomatous change in the uterus. There are two well-circumscribed leiomyomas as noted above with somewhat inhomogeneous echotexture likely due to more diffuse light most change throughout the uterus. 2. Small amount of free fluid in the cul-de-sac. Question recent ovarian cyst leakage. 3.  Study otherwise unremarkable. Electronically Signed   By: Lowella Grip III M.D.   On: 10/14/2020 13:23      Flora Lipps, MD  [facility] 10/14/2020  If 7PM-7AM, please contact night-coverage

## 2020-10-14 NOTE — Progress Notes (Signed)
PROGRESS NOTE  [name] [id number] DOB: Nov 23, 1980 DOA: 10/11/2020 PCP: Antony Blackbird, MD (Inactive)   LOS: 2 days   Brief narrative:  Tracey Hall is a 40 y.o. 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   Patient does have history of recurrent UTI in the past and recent 1 with E. coli in 2019.  She was also noted to be anemic in the ED and blood transfusion was given.    Initial vitals in the ED, showed temperature of 100.6 F and mild hypotension which improved with IV fluids.  Urinalysis shows greater than 50 white blood cells, moderate leukocyte esterase, nitrate positive, no squamous epithelial cells, specific gravity 1.025, and was positive for hyaline cast.    Blood cultures and urine cultures were sent from the ED. Chest x-ray and EKG was unremarkable CT of the abdomen and pelvis showed no acute findings.  Patient was then admitted to hospital urinary tract infection.  Assessment/Plan:  Principal Problem:   Acute cystitis Active Problems:   Acute on chronic anemia   Nausea   Abdominal pain   Dehydration   Hypotension  Acute cystitis: With history of neurogenic bladder and chronic indwelling Foley catheter.  History of recurrent urinary tract infection.  Negative blood cultures  so far.  Urine cultures showing more than 100,000 colony of Klebsiella pneumoniae. Pending final culture and sensitivity.  History of E. coli UTI in the past.  Continue IV Rocephin.  Temperature Max of 98 F.  Acute on chronic iron deficiency anemia:    Hemoglobin of 6.8 on presentation.  Received 2 units of packed RBC.  Hemoglobin of 8.6 at this time.  Patient does have history of  menorrhagia.  No history of rectal bleed.  Hypotension:  Has resolved at this time.    Abdominal pain: CT abdomen pelvis with no acute abdominal process of some fibroids..  Has history of constipation and undergoes disimpaction.  She did have a bowel movement after disimpaction.  Abdominal exam is benign but she is extremely tender on even minimal touching of the skin. We will continue with laxatives while in the hospital.  Patient expresses strong pain and need for medication and is currently on Dilaudid for pain.    I spoke with the GYN on-call today to discuss about fibroids seen on the CT scan.  There was no mention of specific size but some enhancement in the uterus.   He recommended pelvic ultrasound to assess for fibroids, urine for chlamydia and gonorrhea.  Pregnancy test is positive.    Constipation.  Patient requires disimpaction periodically.  Continue MiraLAX and docusate.  As needed disimpaction.  Neurogenic bladder: As a consequence of history of traumatic spinal cord injury resulting in incomplete quadriplegia,and prompting chronic indwelling Foley catheter.  DVT prophylaxis: SCDs Start: 10/12/20 0317, Lovenox subcu   Code Status: Full code  Family Communication:  None today.  Status is: Inpatient  Remains inpatient appropriate because:IV treatments appropriate due to intensity of illness or inability to take PO and Inpatient level of care appropriate due to severity of illness follow culture and sensitivity, pelvic ultrasound,  GYN evaluation   Dispo: The patient is from: Home  Anticipated d/c is to: Home              Patient currently is not medically stable to d/c.   Difficult to place patient No   Consultants:  None  Procedures:  PRBC transfusion 2 units  Anti-infectives:  Marland Kitchen Rocephin IV 10/11/2020>  Anti-infectives (From admission, onward)   Start     Dose/Rate Route Frequency Ordered Stop   10/12/20 2000  cefTRIAXone (ROCEPHIN) 1  g in sodium chloride 0.9 % 100 mL IVPB        1 g 200 mL/hr over 30 Minutes Intravenous Every 24 hours 10/12/20 0341     10/11/20 2315  cefTRIAXone (ROCEPHIN) 1 g in sodium chloride 0.9 % 100 mL IVPB        1 g 200 mL/hr over 30 Minutes Intravenous  Once 10/11/20 2309 10/12/20 0005      Subjective: Today, patient was seen and examined at bedside.  Still complains of severe lower abdominal pain but was sleeping when I woke her up.  Denies any nausea vomiting fever or chills.  Had bowel movement after disimpaction.  Objective: Vitals:   10/13/20 2027 10/14/20 0356  BP: 114/72 118/76  Pulse: 75 78  Resp: 18 20  Temp: 98.5 F (36.9 C) 98 F (36.7 C)  SpO2: 94% 99%    Intake/Output Summary (Last 24 hours) at 10/14/2020 1356 Last data filed at 10/14/2020 0400 Gross per 24 hour  Intake 1757.06 ml  Output 1000 ml  Net 757.06 ml   Filed Weights   10/12/20 1833 10/13/20 0559  Weight: 96.8 kg 97.8 kg   Body mass index is 35.88 kg/m.   Physical Exam:  General: Obese built,, not in obvious distress HENT:   No scleral pallor or icterus noted. Oral mucosa is moist.  Chest:  Clear breath sounds.  Diminished breath sounds bilaterally. No crackles or wheezes.  CVS: S1 &S2 heard. No murmur.  Regular rate and rhythm. Abdomen: Soft, nontender, nondistended.  Bowel sounds are heard.  Urethral catheter in place. Extremities: No cyanosis, clubbing or edema.  Peripheral pulses are palpable. Psych: Alert, awake and oriented, normal mood CNS:  No cranial nerve deficits.  Bilateral lower extremity weakness noted.  Mild upper extremity weakness. Skin: Warm and dry.    Data Review: I have personally reviewed the following laboratory data and studies,  CBC: Recent Labs  Lab 10/11/20 2308 10/12/20 0004 10/12/20 0428 10/12/20 0920 10/12/20 1436 10/13/20 0543 10/14/20 0537  WBC 9.4  --  8.4  --   --  9.8 8.9  NEUTROABS  --   --  3.8  --   --   --   --   HGB 5.8*   < > 6.6* 9.0* 9.0* 8.4*  8.6*  HCT 21.4*   < > 23.7* 30.4* 30.2* 29.2* 29.4*  MCV 62.9*  --  67.1*  --   --  70.9* 71.0*  PLT 600*  --  572*  --   --  481* 483*   < > = values in this interval not displayed.   Basic Metabolic Panel: Recent Labs  Lab 10/11/20 2308 10/12/20 0004 10/12/20 0325 10/12/20 0428 10/13/20 0543 10/14/20 0537  NA 135 139  --  139 135 140  K 3.5 3.6  --  3.6 3.5 3.5  CL 105 103  --  109 105 110  CO2 21*  --   --  20* 24 24  GLUCOSE 100* 96  --  100* 128* 98  BUN  9 7  --  8 7 11   CREATININE 0.66 0.60  --  0.48 0.51 0.38*  CALCIUM 8.7*  --   --  8.2* 8.4* 8.6*  MG  --   --  1.9 2.0 1.9 1.9  PHOS  --   --   --  3.2 3.2 3.5   Liver Function Tests: Recent Labs  Lab 10/11/20 2308 10/12/20 0428 10/13/20 0543  Hall 19 17 15   ALT 15 13 12   ALKPHOS 71 65 74  BILITOT 0.2* 0.4 0.2*  PROT 7.0 6.4* 6.4*  ALBUMIN 3.3* 3.0* 3.0*   Recent Labs  Lab 10/11/20 2308  LIPASE 21   No results for input(s): AMMONIA in the last 168 hours. Cardiac Enzymes: No results for input(s): CKTOTAL, CKMB, CKMBINDEX, TROPONINI in the last 168 hours. BNP (last 3 results) No results for input(s): BNP in the last 8760 hours.  ProBNP (last 3 results) No results for input(s): PROBNP in the last 8760 hours.  CBG: No results for input(s): GLUCAP in the last 168 hours. Recent Results (from the past 240 hour(s))  Blood culture (routine single)     Status: None (Preliminary result)   Collection Time: 10/11/20 11:08 PM   Specimen: BLOOD  Result Value Ref Range Status   Specimen Description   Final    BLOOD RIGHT ANTECUBITAL Performed at [facility] [address], [place], [place] [address]    Special Requests   Final    BOTTLES DRAWN AEROBIC AND ANAEROBIC Blood Culture results may not be optimal due to an inadequate volume of blood received in culture bottles Performed at [facility] [address] [place], [place] [address]    Culture   Final    NO GROWTH  2 DAYS Performed at [facility] [address], [place], [place] [address]    Report Status PENDING  Incomplete  Urine culture     Status: Abnormal   Collection Time: 10/11/20 11:40 PM   Specimen: Urine, Random  Result Value Ref Range Status   Specimen Description   Final    URINE, RANDOM Performed at [facility] [address], [place], [place] [address]    Special Requests   Final    NONE Performed at [facility], [place] [address], [place], [place] [address]    Culture >=100,000 COLONIES/mL KLEBSIELLA PNEUMONIAE (A)  Final   Report Status 10/14/2020 FINAL  Final   Organism ID, Bacteria KLEBSIELLA PNEUMONIAE (A)  Final      Susceptibility   Klebsiella pneumoniae - MIC*    AMPICILLIN >=32 RESISTANT Resistant     CEFAZOLIN <=4 SENSITIVE Sensitive     CEFEPIME <=0.12 SENSITIVE Sensitive     CEFTRIAXONE <=0.25 SENSITIVE Sensitive     CIPROFLOXACIN <=0.25 SENSITIVE Sensitive     GENTAMICIN <=1 SENSITIVE Sensitive     IMIPENEM <=0.25 SENSITIVE Sensitive     NITROFURANTOIN 64 INTERMEDIATE Intermediate     TRIMETH/SULFA <=20 SENSITIVE Sensitive     AMPICILLIN/SULBACTAM 8 SENSITIVE Sensitive     PIP/TAZO <=4 SENSITIVE Sensitive     * >=100,000 COLONIES/mL KLEBSIELLA PNEUMONIAE  Resp Panel by RT-PCR (Flu A&B, Covid) Nasopharyngeal Swab     Status: None   Collection Time: 10/12/20 12:21 AM   Specimen: Nasopharyngeal Swab; Nasopharyngeal(NP) swabs in vial transport medium  Result Value Ref Range Status   SARS Coronavirus 2 by RT PCR NEGATIVE NEGATIVE Final    Comment: (NOTE) SARS-CoV-2 target nucleic acids  are NOT DETECTED.  The SARS-CoV-2 RNA is generally detectable in upper respiratory specimens during the acute phase of infection. The lowest concentration of SARS-CoV-2 viral copies this assay can detect is 138 copies/mL. A negative result does not preclude SARS-Cov-2 infection and should not be used as the sole basis for treatment  or other patient management decisions. A negative result may occur with  improper specimen collection/handling, submission of specimen other than nasopharyngeal swab, presence of viral mutation(s) within the areas targeted by this assay, and inadequate number of viral copies(<138 copies/mL). A negative result must be combined with clinical observations, patient history, and epidemiological information. The expected result is Negative.  Fact Sheet for Patients:  EntrepreneurPulse.com.au  Fact Sheet for Healthcare Providers:  IncredibleEmployment.be  This test is no t yet approved or cleared by the [place] FDA and  has been authorized for detection and/or diagnosis of SARS-CoV-2 by FDA under an Emergency Use Authorization (EUA). This EUA will remain  in effect (meaning this test can be used) for the duration of the COVID-19 declaration under Section 564(b)(1) of the Act, 21 U.S.C.section 360bbb-3(b)(1), unless the authorization is terminated  or revoked sooner.       Influenza A by PCR NEGATIVE NEGATIVE Final   Influenza B by PCR NEGATIVE NEGATIVE Final    Comment: (NOTE) The Xpert Xpress SARS-CoV-2/FLU/RSV plus assay is intended as an aid in the diagnosis of influenza from Nasopharyngeal swab specimens and should not be used as a sole basis for treatment. Nasal washings and aspirates are unacceptable for Xpert Xpress SARS-CoV-2/FLU/RSV testing.  Fact Sheet for Patients: EntrepreneurPulse.com.au  Fact Sheet for Healthcare Providers: IncredibleEmployment.be  This test is not yet approved or cleared by the [place] FDA and has been authorized for detection and/or diagnosis of SARS-CoV-2 by FDA under an Emergency Use Authorization (EUA). This EUA will remain in effect (meaning this test can be used) for the duration of the COVID-19 declaration under Section 564(b)(1) of the Act, 21 U.S.C. section  360bbb-3(b)(1), unless the authorization is terminated or revoked.  Performed at [facility], [place] [address] [place], [place] [address]   Culture, blood (single)     Status: None (Preliminary result)   Collection Time: 10/12/20 12:22 AM   Specimen: BLOOD LEFT HAND  Result Value Ref Range Status   Specimen Description   Final    BLOOD LEFT HAND Performed at [facility] [address], [place], [place] [address]    Special Requests   Final    BOTTLES DRAWN AEROBIC AND ANAEROBIC Blood Culture adequate volume Performed at [facility] [address], [place], [place] [address]    Culture   Final    NO GROWTH 2 DAYS Performed at [facility] [address], [place], [place] [address]    Report Status PENDING  Incomplete     Studies: US PELVIS (TRANSABDOMINAL ONLY)  Result Date: 10/14/2020 CLINICAL DATA:  Leiomyomatous uterus EXAM: TRANSABDOMINAL ULTRASOUND OF PELVIS TECHNIQUE: Transabdominal ultrasound examination of the pelvis was performed including evaluation of the uterus, ovaries, adnexal regions, and pelvic cul-de-sac. COMPARISON:  CT abdomen and pelvis October 12, 2020. FINDINGS: Uterus Measurements: 10.2 x 6.7 x 9.2 cm = volume: 330 mL. There is a mixed echogenicity mass in the leftward aspect of the uterus measuring 1.5 x 1.3 x 1.8 cm. Toward the right, there is a hypoechoic mass in the mid uterus measuring 2.7 x 2.0 x 2.4 cm. Elsewhere, the echotexture of the  uterus is somewhat inhomogeneous. Endometrium Thickness: 13 mm.  No focal abnormality visualized. Right ovary Measurements: 3.5 x 2.1 x 2.9 cm = volume: 11.2 mL. Normal appearance/no adnexal mass. Left ovary Measurements: 4.0 x 1.8 x 3.1 cm = volume: 11.5 mL. Normal appearance/no adnexal mass. Other findings:  Small amount of free pelvic fluid. IMPRESSION: 1. Leiomyomatous change in the uterus. There are two well-circumscribed leiomyomas as noted above with  somewhat inhomogeneous echotexture likely due to more diffuse light most change throughout the uterus. 2. Small amount of free fluid in the cul-de-sac. Question recent ovarian cyst leakage. 3.  Study otherwise unremarkable. Electronically Signed   By: Lowella Grip III M.D.   On: 10/14/2020 13:23      Flora Lipps, MD  [facility] 10/14/2020  If 7PM-7AM, please contact night-coverage

## 2020-10-14 NOTE — Telephone Encounter (Signed)
Leftr detailed message per DPR.

## 2020-10-14 NOTE — Telephone Encounter (Signed)
I signed because I had seen her at least 1 other time, but we do need to let her know, I cannot refill her meds until seen- since she has a habit of no showing- thanks, ML

## 2020-10-14 NOTE — Progress Notes (Signed)
PHARMACIST - PHYSICIAN ORDER COMMUNICATION  CONCERNING: P&T Medication Policy on Weight Management Medications  DESCRIPTION:  This patient's order for:  phentermine  has been discontinued  Eudelia Bunch, Pharm.D 10/14/2020 6:19 AM

## 2020-10-15 DIAGNOSIS — N3000 Acute cystitis without hematuria: Secondary | ICD-10-CM | POA: Diagnosis not present

## 2020-10-15 DIAGNOSIS — E860 Dehydration: Secondary | ICD-10-CM | POA: Diagnosis not present

## 2020-10-15 DIAGNOSIS — R1030 Lower abdominal pain, unspecified: Secondary | ICD-10-CM | POA: Diagnosis not present

## 2020-10-15 DIAGNOSIS — D649 Anemia, unspecified: Secondary | ICD-10-CM | POA: Diagnosis not present

## 2020-10-15 LAB — GC/CHLAMYDIA PROBE AMP (~~LOC~~) NOT AT ARMC
Chlamydia: NEGATIVE
Comment: NEGATIVE
Comment: NORMAL
Neisseria Gonorrhea: NEGATIVE

## 2020-10-15 LAB — RPR: RPR Ser Ql: NONREACTIVE

## 2020-10-15 MED ORDER — PHENAZOPYRIDINE HCL 100 MG PO TABS: 100.0000 mg | ORAL_TABLET | Freq: Three times a day (TID) | ORAL | 0 refills | Status: AC

## 2020-10-15 MED ORDER — CEPHALEXIN 500 MG PO CAPS: 500.0000 mg | ORAL_CAPSULE | Freq: Two times a day (BID) | ORAL | 0 refills | Status: AC

## 2020-10-15 MED ORDER — DICYCLOMINE HCL 10 MG PO CAPS: 10.0000 mg | ORAL_CAPSULE | Freq: Three times a day (TID) | ORAL | 0 refills | Status: DC

## 2020-10-15 MED ORDER — OXYBUTYNIN CHLORIDE ER 10 MG PO TB24: 10.0000 mg | ORAL_TABLET | Freq: Every day | ORAL | 0 refills | Status: AC

## 2020-10-15 MED ORDER — POLYVINYL ALCOHOL 1.4 % OP SOLN
1.0000 [drp] | OPHTHALMIC | Status: DC | PRN
  Filled 2020-10-15: qty 15

## 2020-10-15 MED ORDER — DOCUSATE SODIUM 100 MG PO CAPS: 100.0000 mg | ORAL_CAPSULE | Freq: Two times a day (BID) | ORAL | 2 refills | Status: DC
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
## 2020-10-15 NOTE — Care Management Important Message (Signed)
Important Message  Patient Details IM Letter given to the Patient. Name: Tracey Hall MRN: [id number] Date of Birth: 04/18/1981   Medicare Important Message Given:  Yes     Kerin Salen 10/15/2020, 9:26 AM

## 2020-10-15 NOTE — Plan of Care (Signed)
  Problem: Nutrition: Goal: Adequate nutrition will be maintained Outcome: Adequate for Discharge   Problem: Elimination: Goal: Will not experience complications related to bowel motility Outcome: Adequate for Discharge   Problem: Pain Managment: Goal: General experience of comfort will improve Outcome: Adequate for Discharge   Problem: Safety: Goal: Ability to remain free from injury will improve Outcome: Adequate for Discharge   Problem: Urinary Elimination: Goal: Signs and symptoms of infection will decrease Outcome: Adequate for Discharge   Problem: Skin Integrity: Goal: Risk for impaired skin integrity will decrease Outcome: Adequate for Discharge

## 2020-10-16 ENCOUNTER — Telehealth: Payer: Self-pay

## 2020-10-16 NOTE — Telephone Encounter (Signed)
Transition Care Management Unsuccessful Follow-up Telephone Call  Date of discharge and from where:  10/15/2020, [facility]   Attempts:  1st Attempt  Reason for unsuccessful TCM follow-up call:  Left voice message on # [phone]. Call back requested to this CM. Call also placed to # [phone] twice  and the recording stated that the call cannot be completed at this time.   Need to inquire if patient would like to schedule follow up appointment at [facility].  Dr Chapman Fitch, her prior PCP is no longer at [facility]

## 2020-10-17 ENCOUNTER — Telehealth: Payer: Self-pay | Admitting: *Deleted

## 2020-10-17 ENCOUNTER — Telehealth: Payer: Self-pay

## 2020-10-17 LAB — CULTURE, BLOOD (SINGLE)
Culture: NO GROWTH
Culture: NO GROWTH
Special Requests: ADEQUATE

## 2020-10-17 NOTE — Telephone Encounter (Signed)
Contacted patient to inform catheter supply fax was sent.    Patient asked me to send a message to Dr Dagoberto Ligas asking her to please call patient.  Patient is concerned about test results that she had done during her last ED visit.

## 2020-10-17 NOTE — Telephone Encounter (Signed)
Transition Care Management Unsuccessful Follow-up Telephone Call  Date of discharge and from where:  10/15/2020, [facility]   Attempts:  2nd Attempt  Reason for unsuccessful TCM follow-up call:  Left voice message on # [phone]. Call back requested to this CM.  Call also placed to # [phone] twice. One recording stated that the call cannot be completed at this time and the other recording stated that the number is no longer in service.   Need to inquire if patient would like to schedule follow up appointment at [facility].  Dr Chapman Fitch, her prior PCP is no longer at [facility]

## 2020-10-18 ENCOUNTER — Telehealth: Payer: Self-pay

## 2020-10-18 NOTE — Telephone Encounter (Signed)
Transition Care Management Unsuccessful Follow-up Telephone Call  Date of discharge and from where:  10/15/2020, [facility]   Attempts:  3rd Attempt  Reason for unsuccessful TCM follow-up call:  Left voice message on # [phone]. Call back requested to this CM.  Need to inquire if patient would like to schedule follow up appointment at [facility]. Dr Chapman Fitch, her prior PCP is no longer at [facility]

## 2020-10-18 NOTE — Telephone Encounter (Signed)
Attempted to call pt- was busy 3x at 9:35 am on 10/18/20.

## 2020-10-18 NOTE — Telephone Encounter (Signed)
Attempted calling again 1 hour later- x2- phone still busy- 10/18/20- 10:30 am

## 2020-10-21 ENCOUNTER — Telehealth: Payer: Self-pay

## 2020-10-21 NOTE — Telephone Encounter (Signed)
Letter sent to patient requesting she contact [facility] to schedule a follow up appointment as we have not been able to reach her post discharge.

## 2020-10-26 ENCOUNTER — Other Ambulatory Visit: Payer: Self-pay | Admitting: Physical Medicine and Rehabilitation

## 2020-10-29 NOTE — Telephone Encounter (Signed)
Patient requesting refill via fax from pharmacy.  Patient scheduled f/u with Dr. Dagoberto Ligas 11/27/2020

## 2020-11-27 ENCOUNTER — Encounter: Payer: Self-pay | Admitting: Physical Medicine and Rehabilitation

## 2020-11-27 ENCOUNTER — Other Ambulatory Visit: Payer: Self-pay

## 2020-11-27 ENCOUNTER — Encounter
Payer: Medicare Other | Attending: Physical Medicine and Rehabilitation | Admitting: Physical Medicine and Rehabilitation

## 2020-11-27 VITALS — BP 92/56 | HR 87 | Temp 98.3°F | Ht 65.0 in | Wt 216.0 lb

## 2020-11-27 DIAGNOSIS — R1031 Right lower quadrant pain: Secondary | ICD-10-CM | POA: Insufficient documentation

## 2020-11-27 DIAGNOSIS — G894 Chronic pain syndrome: Secondary | ICD-10-CM | POA: Insufficient documentation

## 2020-11-27 DIAGNOSIS — M792 Neuralgia and neuritis, unspecified: Secondary | ICD-10-CM | POA: Diagnosis not present

## 2020-11-27 DIAGNOSIS — G8929 Other chronic pain: Secondary | ICD-10-CM | POA: Diagnosis not present

## 2020-11-27 DIAGNOSIS — R252 Cramp and spasm: Secondary | ICD-10-CM | POA: Insufficient documentation

## 2020-11-27 DIAGNOSIS — G8250 Quadriplegia, unspecified: Secondary | ICD-10-CM | POA: Diagnosis not present

## 2020-11-27 MED ORDER — GABAPENTIN 300 MG PO CAPS: 300.0000 mg | ORAL_CAPSULE | Freq: Three times a day (TID) | ORAL | 5 refills | Status: DC

## 2020-11-27 MED ORDER — DIAZEPAM 5 MG PO TABS: ORAL_TABLET | ORAL | 5 refills | Status: DC

## 2020-11-27 MED ORDER — TRAMADOL HCL 50 MG PO TABS: 50.0000 mg | ORAL_TABLET | Freq: Four times a day (QID) | ORAL | 5 refills | Status: DC | PRN

## 2020-11-27 MED ORDER — DULOXETINE HCL 60 MG PO CPEP: 60.0000 mg | ORAL_CAPSULE | Freq: Every day | ORAL | 11 refills | Status: DC

## 2020-11-27 MED ORDER — TIZANIDINE HCL 4 MG PO TABS: ORAL_TABLET | ORAL | 5 refills | Status: DC
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
## 2020-11-27 NOTE — Patient Instructions (Signed)
Patient is a 40 yr old female withC6 tetraplegia- ASIA A with contractures-  due to [place]- with neurogenic bowel and bladder, spasticity and contractures and frequent UTI/sepsis/. Also has autonomic dysreflexia- intermittent.  Here for f/u on SCI and chronic progressive pain.   1. Refill tramadol 50 mg q6 hours prn- #120- 5 refills for chronic pain.   2. Valium 5 mg BID for spasms- 5 refills- for spasms- works better than other regimen  3. Duloxetine- refilled 60 mg daily- for mood/nerve pain- 11 refills  4 [facility] [address]- works better for her.   5. Neuropathic pain- abdominal pain- can change with temp changes,constipation, and infections- Will try Gabapentin- 300 mg nightly x 3 days, then 300 mg 3x/day x 3 days- then 600 mg 2x/day - then stay there and see how it goes. And can increase in future if needs to.   Can slow increase in meds down, if have any mild side effects- sleepiness, dizziness,   6. Also need to refill the Zanaflex- for spasticity- 4 mg 2x/day- for 5 refills  7. F/U in 2 months- double appt-SCI

## 2020-12-25 ENCOUNTER — Encounter (HOSPITAL_COMMUNITY): Payer: Self-pay | Admitting: Emergency Medicine

## 2020-12-25 ENCOUNTER — Inpatient Hospital Stay (HOSPITAL_COMMUNITY)
Admission: EM | Admit: 2020-12-25 | Discharge: 2020-12-28 | DRG: 698 | Disposition: A | Discharge status: Home / Self Care | Payer: Medicare Other | Attending: Family Medicine | Admitting: Family Medicine

## 2020-12-25 DIAGNOSIS — N3001 Acute cystitis with hematuria: Secondary | ICD-10-CM | POA: Diagnosis not present

## 2020-12-25 DIAGNOSIS — Z79899 Other long term (current) drug therapy: Secondary | ICD-10-CM | POA: Diagnosis not present

## 2020-12-25 DIAGNOSIS — Z978 Presence of other specified devices: Secondary | ICD-10-CM

## 2020-12-25 DIAGNOSIS — D649 Anemia, unspecified: Secondary | ICD-10-CM

## 2020-12-25 DIAGNOSIS — E669 Obesity, unspecified: Secondary | ICD-10-CM | POA: Diagnosis present

## 2020-12-25 DIAGNOSIS — Z91040 Latex allergy status: Secondary | ICD-10-CM

## 2020-12-25 DIAGNOSIS — Z87440 Personal history of urinary (tract) infections: Secondary | ICD-10-CM | POA: Diagnosis not present

## 2020-12-25 DIAGNOSIS — Z888 Allergy status to other drugs, medicaments and biological substances status: Secondary | ICD-10-CM | POA: Diagnosis not present

## 2020-12-25 DIAGNOSIS — N390 Urinary tract infection, site not specified: Secondary | ICD-10-CM | POA: Diagnosis present

## 2020-12-25 DIAGNOSIS — T83511A Infection and inflammatory reaction due to indwelling urethral catheter, initial encounter: Secondary | ICD-10-CM | POA: Diagnosis present

## 2020-12-25 DIAGNOSIS — F419 Anxiety disorder, unspecified: Secondary | ICD-10-CM | POA: Diagnosis present

## 2020-12-25 DIAGNOSIS — Z20822 Contact with and (suspected) exposure to covid-19: Secondary | ICD-10-CM | POA: Diagnosis present

## 2020-12-25 DIAGNOSIS — G894 Chronic pain syndrome: Secondary | ICD-10-CM | POA: Diagnosis present

## 2020-12-25 DIAGNOSIS — A419 Sepsis, unspecified organism: Secondary | ICD-10-CM

## 2020-12-25 DIAGNOSIS — D509 Iron deficiency anemia, unspecified: Secondary | ICD-10-CM | POA: Diagnosis present

## 2020-12-25 DIAGNOSIS — Z6835 Body mass index (BMI) 35.0-35.9, adult: Secondary | ICD-10-CM | POA: Diagnosis not present

## 2020-12-25 DIAGNOSIS — F32A Depression, unspecified: Secondary | ICD-10-CM | POA: Diagnosis present

## 2020-12-25 DIAGNOSIS — R509 Fever, unspecified: Secondary | ICD-10-CM | POA: Diagnosis not present

## 2020-12-25 DIAGNOSIS — R6520 Severe sepsis without septic shock: Secondary | ICD-10-CM | POA: Diagnosis not present

## 2020-12-25 DIAGNOSIS — N319 Neuromuscular dysfunction of bladder, unspecified: Secondary | ICD-10-CM | POA: Diagnosis present

## 2020-12-25 DIAGNOSIS — Y846 Urinary catheterization as the cause of abnormal reaction of the patient, or of later complication, without mention of misadventure at the time of the procedure: Secondary | ICD-10-CM | POA: Diagnosis present

## 2020-12-25 DIAGNOSIS — G8250 Quadriplegia, unspecified: Secondary | ICD-10-CM | POA: Diagnosis present

## 2020-12-25 NOTE — ED Triage Notes (Signed)
Pt states that she thinks she has a bladder or kidney infection. Febrile in triage.

## 2020-12-26 ENCOUNTER — Other Ambulatory Visit: Payer: Self-pay

## 2020-12-26 ENCOUNTER — Emergency Department (HOSPITAL_COMMUNITY): Payer: Medicare Other

## 2020-12-26 DIAGNOSIS — Y846 Urinary catheterization as the cause of abnormal reaction of the patient, or of later complication, without mention of misadventure at the time of the procedure: Secondary | ICD-10-CM | POA: Diagnosis present

## 2020-12-26 DIAGNOSIS — Z978 Presence of other specified devices: Secondary | ICD-10-CM

## 2020-12-26 DIAGNOSIS — A419 Sepsis, unspecified organism: Secondary | ICD-10-CM

## 2020-12-26 DIAGNOSIS — Z87440 Personal history of urinary (tract) infections: Secondary | ICD-10-CM | POA: Diagnosis not present

## 2020-12-26 DIAGNOSIS — E669 Obesity, unspecified: Secondary | ICD-10-CM | POA: Diagnosis present

## 2020-12-26 DIAGNOSIS — Z888 Allergy status to other drugs, medicaments and biological substances status: Secondary | ICD-10-CM | POA: Diagnosis not present

## 2020-12-26 DIAGNOSIS — Z6835 Body mass index (BMI) 35.0-35.9, adult: Secondary | ICD-10-CM | POA: Diagnosis not present

## 2020-12-26 DIAGNOSIS — N390 Urinary tract infection, site not specified: Secondary | ICD-10-CM | POA: Diagnosis present

## 2020-12-26 DIAGNOSIS — F32A Depression, unspecified: Secondary | ICD-10-CM | POA: Diagnosis present

## 2020-12-26 DIAGNOSIS — Z20822 Contact with and (suspected) exposure to covid-19: Secondary | ICD-10-CM | POA: Diagnosis present

## 2020-12-26 DIAGNOSIS — Z91040 Latex allergy status: Secondary | ICD-10-CM | POA: Diagnosis not present

## 2020-12-26 DIAGNOSIS — N319 Neuromuscular dysfunction of bladder, unspecified: Secondary | ICD-10-CM | POA: Diagnosis present

## 2020-12-26 DIAGNOSIS — D509 Iron deficiency anemia, unspecified: Secondary | ICD-10-CM | POA: Diagnosis present

## 2020-12-26 DIAGNOSIS — F419 Anxiety disorder, unspecified: Secondary | ICD-10-CM | POA: Diagnosis present

## 2020-12-26 DIAGNOSIS — Z79899 Other long term (current) drug therapy: Secondary | ICD-10-CM | POA: Diagnosis not present

## 2020-12-26 DIAGNOSIS — R6520 Severe sepsis without septic shock: Secondary | ICD-10-CM | POA: Diagnosis present

## 2020-12-26 DIAGNOSIS — G8250 Quadriplegia, unspecified: Secondary | ICD-10-CM | POA: Diagnosis present

## 2020-12-26 DIAGNOSIS — R509 Fever, unspecified: Secondary | ICD-10-CM | POA: Diagnosis not present

## 2020-12-26 DIAGNOSIS — G894 Chronic pain syndrome: Secondary | ICD-10-CM | POA: Diagnosis present

## 2020-12-26 DIAGNOSIS — D649 Anemia, unspecified: Secondary | ICD-10-CM

## 2020-12-26 DIAGNOSIS — T83511A Infection and inflammatory reaction due to indwelling urethral catheter, initial encounter: Secondary | ICD-10-CM | POA: Diagnosis present

## 2020-12-26 DIAGNOSIS — N3001 Acute cystitis with hematuria: Secondary | ICD-10-CM | POA: Diagnosis present

## 2020-12-26 LAB — URINALYSIS, ROUTINE W REFLEX MICROSCOPIC
Bilirubin Urine: NEGATIVE
Glucose, UA: NEGATIVE mg/dL
Ketones, ur: NEGATIVE mg/dL
Nitrite: POSITIVE — AB
Protein, ur: NEGATIVE mg/dL
Specific Gravity, Urine: 1.008 (ref 1.005–1.030)
pH: 6 (ref 5.0–8.0)

## 2020-12-26 LAB — CBC
HCT: 23.6 % — ABNORMAL LOW (ref 36.0–46.0)
Hemoglobin: 7.2 g/dL — ABNORMAL LOW (ref 12.0–15.0)
MCH: 22.5 pg — ABNORMAL LOW (ref 26.0–34.0)
MCHC: 30.5 g/dL (ref 30.0–36.0)
MCV: 73.8 fL — ABNORMAL LOW (ref 80.0–100.0)
Platelets: 422 10*3/uL — ABNORMAL HIGH (ref 150–400)
RBC: 3.2 MIL/uL — ABNORMAL LOW (ref 3.87–5.11)
RDW: 20 % — ABNORMAL HIGH (ref 11.5–15.5)
WBC: 12.1 10*3/uL — ABNORMAL HIGH (ref 4.0–10.5)
nRBC: 0 % (ref 0.0–0.2)

## 2020-12-26 LAB — CBC WITH DIFFERENTIAL/PLATELET
Abs Immature Granulocytes: 0.06 10*3/uL (ref 0.00–0.07)
Basophils Absolute: 0 10*3/uL (ref 0.0–0.1)
Basophils Relative: 0 %
Eosinophils Absolute: 0.2 10*3/uL (ref 0.0–0.5)
Eosinophils Relative: 2 %
HCT: 23.4 % — ABNORMAL LOW (ref 36.0–46.0)
Hemoglobin: 7.3 g/dL — ABNORMAL LOW (ref 12.0–15.0)
Immature Granulocytes: 1 %
Lymphocytes Relative: 24 %
Lymphs Abs: 2.8 10*3/uL (ref 0.7–4.0)
MCH: 22.9 pg — ABNORMAL LOW (ref 26.0–34.0)
MCHC: 31.2 g/dL (ref 30.0–36.0)
MCV: 73.4 fL — ABNORMAL LOW (ref 80.0–100.0)
Monocytes Absolute: 0.8 10*3/uL (ref 0.1–1.0)
Monocytes Relative: 7 %
Neutro Abs: 7.9 10*3/uL — ABNORMAL HIGH (ref 1.7–7.7)
Neutrophils Relative %: 66 %
Platelets: 455 10*3/uL — ABNORMAL HIGH (ref 150–400)
RBC: 3.19 MIL/uL — ABNORMAL LOW (ref 3.87–5.11)
RDW: 20.2 % — ABNORMAL HIGH (ref 11.5–15.5)
WBC: 11.8 10*3/uL — ABNORMAL HIGH (ref 4.0–10.5)
nRBC: 0 % (ref 0.0–0.2)

## 2020-12-26 LAB — COMPREHENSIVE METABOLIC PANEL
ALT: 18 U/L (ref 0–44)
AST: 23 U/L (ref 15–41)
Albumin: 3.2 g/dL — ABNORMAL LOW (ref 3.5–5.0)
Alkaline Phosphatase: 85 U/L (ref 38–126)
Anion gap: 9 (ref 5–15)
BUN: 10 mg/dL (ref 6–20)
CO2: 21 mmol/L — ABNORMAL LOW (ref 22–32)
Calcium: 8.5 mg/dL — ABNORMAL LOW (ref 8.9–10.3)
Chloride: 103 mmol/L (ref 98–111)
Creatinine, Ser: 0.71 mg/dL (ref 0.44–1.00)
GFR, Estimated: >60 mL/min (ref >60)
Glucose, Bld: 106 mg/dL — ABNORMAL HIGH (ref 70–99)
Potassium: 3.9 mmol/L (ref 3.5–5.1)
Sodium: 133 mmol/L — ABNORMAL LOW (ref 135–145)
Total Bilirubin: 0.3 mg/dL (ref 0.3–1.2)
Total Protein: 7.2 g/dL (ref 6.5–8.1)

## 2020-12-26 LAB — RESP PANEL BY RT-PCR (FLU A&B, COVID) ARPGX2
Influenza A by PCR: NEGATIVE
Influenza B by PCR: NEGATIVE
SARS Coronavirus 2 by RT PCR: NEGATIVE

## 2020-12-26 LAB — LACTIC ACID, PLASMA
Lactic Acid, Venous: 3.1 mmol/L (ref 0.5–1.9)
Lactic Acid, Venous: 1.3 mmol/L (ref 0.5–1.9)

## 2020-12-26 LAB — PROTIME-INR
INR: 1.1 (ref 0.8–1.2)
Prothrombin Time: 14.1 seconds (ref 11.4–15.2)

## 2020-12-26 LAB — APTT: aPTT: 38 seconds — ABNORMAL HIGH (ref 24–36)

## 2020-12-26 MED ORDER — HYDROMORPHONE HCL 1 MG/ML IJ SOLN
0.5000 mg | Freq: Once | INTRAMUSCULAR | Status: AC | PRN
  Administered 2020-12-26: 0.5 mg via INTRAVENOUS
  Filled 2020-12-26: qty 1

## 2020-12-26 MED ORDER — LACTATED RINGERS IV BOLUS (SEPSIS)
500.0000 mL | Freq: Once | INTRAVENOUS | Status: AC
  Administered 2020-12-26: 500 mL via INTRAVENOUS

## 2020-12-26 MED ORDER — SODIUM CHLORIDE 0.9 % IV BOLUS
1500.0000 mL | Freq: Once | INTRAVENOUS | Status: AC
  Administered 2020-12-26: 1500 mL via INTRAVENOUS

## 2020-12-26 MED ORDER — OXYCODONE HCL 5 MG PO TABS
5.0000 mg | ORAL_TABLET | ORAL | Status: DC | PRN
  Administered 2020-12-26 – 2020-12-27 (×2): 10 mg via ORAL
  Administered 2020-12-28 (×2): 5 mg via ORAL
  Filled 2020-12-26 (×2): qty 2
  Filled 2020-12-26 (×2): qty 1
  Filled 2020-12-26: qty 2

## 2020-12-26 MED ORDER — SODIUM CHLORIDE 0.9 % IV BOLUS
1000.0000 mL | Freq: Once | INTRAVENOUS | Status: AC
  Administered 2020-12-26: 1000 mL via INTRAVENOUS

## 2020-12-26 MED ORDER — FENTANYL CITRATE (PF) 100 MCG/2ML IJ SOLN
50.0000 ug | Freq: Once | INTRAMUSCULAR | Status: AC
  Administered 2020-12-26: 50 ug via INTRAVENOUS
  Filled 2020-12-26: qty 2

## 2020-12-26 MED ORDER — SODIUM CHLORIDE 0.9 % IV SOLN
1.0000 g | INTRAVENOUS | Status: DC
  Administered 2020-12-27 – 2020-12-28 (×2): 1 g via INTRAVENOUS
  Filled 2020-12-26 (×2): qty 10

## 2020-12-26 MED ORDER — CHLORHEXIDINE GLUCONATE CLOTH 2 % EX PADS
6.0000 | MEDICATED_PAD | Freq: Every day | CUTANEOUS | Status: DC
  Administered 2020-12-26 – 2020-12-27 (×2): 6 via TOPICAL

## 2020-12-26 MED ORDER — HYDROMORPHONE HCL 1 MG/ML IJ SOLN
1.0000 mg | Freq: Once | INTRAMUSCULAR | Status: AC | PRN
  Administered 2020-12-26: 1 mg via INTRAVENOUS
  Filled 2020-12-26: qty 1

## 2020-12-26 MED ORDER — ACETAMINOPHEN 500 MG PO TABS
1000.0000 mg | ORAL_TABLET | Freq: Once | ORAL | Status: AC
  Administered 2020-12-26: 1000 mg via ORAL
  Filled 2020-12-26 (×2): qty 2

## 2020-12-26 MED ORDER — DIAZEPAM 5 MG PO TABS
5.0000 mg | ORAL_TABLET | Freq: Two times a day (BID) | ORAL | Status: DC
  Administered 2020-12-26 – 2020-12-27 (×4): 5 mg via ORAL
  Filled 2020-12-26 (×4): qty 1

## 2020-12-26 MED ORDER — TIZANIDINE HCL 4 MG PO TABS
4.0000 mg | ORAL_TABLET | Freq: Two times a day (BID) | ORAL | Status: DC
  Administered 2020-12-26 – 2020-12-27 (×4): 4 mg via ORAL
  Filled 2020-12-26 (×4): qty 1

## 2020-12-26 MED ORDER — LORAZEPAM 2 MG/ML IJ SOLN
1.0000 mg | Freq: Four times a day (QID) | INTRAMUSCULAR | Status: DC | PRN
  Administered 2020-12-26 – 2020-12-27 (×2): 1 mg via INTRAVENOUS
  Filled 2020-12-26 (×2): qty 1

## 2020-12-26 MED ORDER — TRAMADOL HCL 50 MG PO TABS: 50.0000 mg | ORAL_TABLET | Freq: Four times a day (QID) | ORAL | Status: DC | PRN

## 2020-12-26 MED ORDER — SODIUM CHLORIDE 0.9 % IV SOLN
1.0000 g | Freq: Once | INTRAVENOUS | Status: AC
  Administered 2020-12-26: 1 g via INTRAVENOUS
  Filled 2020-12-26: qty 10

## 2020-12-26 MED ORDER — ACETAMINOPHEN 325 MG PO TABS: 650.0000 mg | ORAL_TABLET | Freq: Four times a day (QID) | ORAL | Status: DC | PRN

## 2020-12-26 MED ORDER — ONDANSETRON HCL 4 MG/2ML IJ SOLN
4.0000 mg | Freq: Once | INTRAMUSCULAR | Status: AC
  Administered 2020-12-26: 4 mg via INTRAVENOUS
  Filled 2020-12-26: qty 2

## 2020-12-26 MED ORDER — ACETAMINOPHEN 325 MG PO TABS
650.0000 mg | ORAL_TABLET | Freq: Four times a day (QID) | ORAL | Status: DC
  Administered 2020-12-26 – 2020-12-28 (×7): 650 mg via ORAL
  Filled 2020-12-26 (×7): qty 2

## 2020-12-26 MED ORDER — PROCHLORPERAZINE EDISYLATE 10 MG/2ML IJ SOLN: 5.0000 mg | Freq: Four times a day (QID) | INTRAMUSCULAR | Status: DC | PRN

## 2020-12-26 MED ORDER — ENOXAPARIN SODIUM 60 MG/0.6ML IJ SOSY
50.0000 mg | PREFILLED_SYRINGE | INTRAMUSCULAR | Status: DC
  Administered 2020-12-26 – 2020-12-27 (×2): 50 mg via SUBCUTANEOUS
  Filled 2020-12-26: qty 0.5
  Filled 2020-12-26: qty 0.6

## 2020-12-26 MED ORDER — ACETAMINOPHEN 650 MG RE SUPP: 650.0000 mg | Freq: Four times a day (QID) | RECTAL | Status: DC | PRN

## 2020-12-26 NOTE — ED Notes (Signed)
Patient asking for pain medication that is not fentanyl. Dr. Marlowe Sax notified.

## 2020-12-26 NOTE — ED Notes (Signed)
Patient pulled out IV to left wrist.

## 2020-12-26 NOTE — ED Notes (Signed)
Patient given apple juice

## 2020-12-26 NOTE — ED Notes (Addendum)
Patient asking for dilaudid again. Dr. Vena Austria. RN offered the PRN Tylenol, patient refused and said "not strong enough to touch this pain."

## 2020-12-26 NOTE — ED Notes (Signed)
Dr. Reesa Chew messaged for patients pain medication request.

## 2020-12-26 NOTE — Progress Notes (Signed)
PROGRESS NOTE  Brief Narrative: Tracey Hall is a 40 y.o. [place] with a history of quadriplegia, neurogenic bladder w/chronic foley (last exchanged 7/3) due to SCI complicated by chronic pain syndrome, depression, anxiety and recurrent UTIs who presented to the ED 7/6 with fever, nausea, and chills with pain "all over." Confirmed to be febrile to 103.49F and tachycardic with leukocytosis, grossly positive urinalysis, elevated lactic acid to 3.1. Blood cultures drawn, IV fluid boluses and ceftriaxone given on admission for sepsis due to CAUTI.   Subjective: Pt shaking, sitting up in bed, stating pain is intolerable, not improved by fentanyl for more than 30 seconds, refused tylenol. Pain is present "all over," including her head, teeth, fingernails, everywhere. Does not specifically mention abdominal or chest pain. Feeling chills.  Objective: BP 118/68 (BP Location: Left Wrist)   Pulse 88   Temp 99.8 F (37.7 C) (Oral)   Resp 18   Ht 5\' 5"  (1.651 m)   Wt 97.7 kg   SpO2 97%   BMI 35.84 kg/m   Gen: Obese female nontoxic appearing Pulm: Clear and nonlabored  CV: RRR, no murmur, no JVD, no pitting edema GI: Soft, NT, ND, +BS  Neuro: Alert and oriented. Incomplete quadriplegia with hand deformities. Skin: No rashes, lesions or ulcers on visualized skin.  Assessment & Plan: Principal Problem:   UTI (urinary tract infection) Active Problems:   Neurogenic bladder   Severe sepsis (HCC)   Chronic anemia   Chronic indwelling Foley catheter  Severe sepsis due to CAUTI:  - Continue ceftriaxone (based on previous urine Cx) - Trend leukocytosis and vital signs. Lactic acid has cleared. - Monitor blood cultures - Have given 30cc/kg IVF, pt able to take po, so will hold further IVF at this time.  Quadriplegia, neurogenic bladder, s/p SCI:  - Restart home antispasmodics.   Chronic pain syndrome: Pain reported is severe but without focal signs on exam or reported focal symptoms. Abd,  specifically, is benign at this time. Pt without emesis, complaining of fentanyl IV wearing off quickly.  - Will substitute oxycodone for home med tramadol for now. No indication for IV medications at this time.  - Schedule tylenol as fever may be contributing.   Chronic iron deficiency anemia: Has required transfusions during prior hospitalizations. - Hgb 7.3 > 7.2 which is on lower end of baseline. Since BP has improved/normalized, we will hold transfusion at this time, though pt consents if hgb is 7 or symptomatic.   Depression, anxiety:  - Restart diazepam (confirmed on PDMP)  Otherwise, please see H&P by Dr. Marlowe Sax from this morning for details.  Patrecia Pour, MD Pager on amion 12/26/2020, 3:34 PM
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
## 2020-12-26 NOTE — ED Notes (Signed)
Patient is doubled over in bed and complaining of pain and nausea. Will administer PRN for nausea
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
## 2020-12-26 NOTE — Sepsis Progress Note (Signed)
Following for sepsis monitoring ?

## 2020-12-27 DIAGNOSIS — N319 Neuromuscular dysfunction of bladder, unspecified: Secondary | ICD-10-CM

## 2020-12-27 DIAGNOSIS — R6520 Severe sepsis without septic shock: Secondary | ICD-10-CM

## 2020-12-27 DIAGNOSIS — D649 Anemia, unspecified: Secondary | ICD-10-CM

## 2020-12-27 DIAGNOSIS — Z978 Presence of other specified devices: Secondary | ICD-10-CM

## 2020-12-27 DIAGNOSIS — A419 Sepsis, unspecified organism: Secondary | ICD-10-CM

## 2020-12-27 LAB — BLOOD CULTURE ID PANEL (REFLEXED) - BCID2

## 2020-12-27 LAB — BASIC METABOLIC PANEL
Anion gap: 6 (ref 5–15)
BUN: 8 mg/dL (ref 6–20)
CO2: 24 mmol/L (ref 22–32)
Calcium: 7.9 mg/dL — ABNORMAL LOW (ref 8.9–10.3)
Chloride: 106 mmol/L (ref 98–111)
Creatinine, Ser: 0.63 mg/dL (ref 0.44–1.00)
GFR, Estimated: >60 mL/min (ref >60)
Glucose, Bld: 118 mg/dL — ABNORMAL HIGH (ref 70–99)
Potassium: 3.3 mmol/L — ABNORMAL LOW (ref 3.5–5.1)
Sodium: 136 mmol/L (ref 135–145)

## 2020-12-27 LAB — CBC WITH DIFFERENTIAL/PLATELET
Abs Immature Granulocytes: 0.03 10*3/uL (ref 0.00–0.07)
Basophils Absolute: 0 10*3/uL (ref 0.0–0.1)
Basophils Relative: 0 %
Eosinophils Absolute: 0.3 10*3/uL (ref 0.0–0.5)
Eosinophils Relative: 4 %
HCT: 22.2 % — ABNORMAL LOW (ref 36.0–46.0)
Hemoglobin: 6.6 g/dL — CL (ref 12.0–15.0)
Immature Granulocytes: 0 %
Lymphocytes Relative: 30 %
Lymphs Abs: 2.4 10*3/uL (ref 0.7–4.0)
MCH: 22.4 pg — ABNORMAL LOW (ref 26.0–34.0)
MCHC: 29.7 g/dL — ABNORMAL LOW (ref 30.0–36.0)
MCV: 75.5 fL — ABNORMAL LOW (ref 80.0–100.0)
Monocytes Absolute: 0.8 10*3/uL (ref 0.1–1.0)
Monocytes Relative: 10 %
Neutro Abs: 4.5 10*3/uL (ref 1.7–7.7)
Neutrophils Relative %: 56 %
Platelets: 372 10*3/uL (ref 150–400)
RBC: 2.94 MIL/uL — ABNORMAL LOW (ref 3.87–5.11)
RDW: 19.9 % — ABNORMAL HIGH (ref 11.5–15.5)
WBC: 8.1 10*3/uL (ref 4.0–10.5)
nRBC: 0 % (ref 0.0–0.2)

## 2020-12-27 LAB — PREPARE RBC (CROSSMATCH)

## 2020-12-27 MED ORDER — POTASSIUM CHLORIDE CRYS ER 20 MEQ PO TBCR
40.0000 meq | EXTENDED_RELEASE_TABLET | Freq: Once | ORAL | Status: AC
  Administered 2020-12-27: 40 meq via ORAL
  Filled 2020-12-27: qty 2

## 2020-12-27 MED ORDER — LACTATED RINGERS IV BOLUS
500.0000 mL | Freq: Once | INTRAVENOUS | Status: AC
  Administered 2020-12-27: 500 mL via INTRAVENOUS

## 2020-12-27 MED ORDER — VANCOMYCIN HCL 2000 MG/400ML IV SOLN
2000.0000 mg | INTRAVENOUS | Status: AC
  Administered 2020-12-27: 2000 mg via INTRAVENOUS
  Filled 2020-12-27: qty 400

## 2020-12-27 MED ORDER — SODIUM CHLORIDE 0.9% IV SOLUTION: Freq: Once | INTRAVENOUS | Status: AC

## 2020-12-27 MED ORDER — SODIUM CHLORIDE 0.9 % IV BOLUS
250.0000 mL | Freq: Once | INTRAVENOUS | Status: AC
  Administered 2020-12-27: 250 mL via INTRAVENOUS

## 2020-12-27 NOTE — Progress Notes (Signed)
PROGRESS NOTE  Tracey Hall  MLY:[id number] DOB: 05-07-1981 DOA: 12/25/2020 PCP: Tracey Blackbird, MD (Inactive)  Outpatient Specialists: PM&R Dr. Dagoberto Ligas Brief Narrative: Tracey Hall is a 40 y.o. [place] Kitchensex with a history of quadriplegia, neurogenic bladder w/chronic foley (last exchanged 7/3) due to SCI complicated by chronic pain syndrome, depression, anxiety and recurrent UTIs who presented to the ED 7/6 with fever, nausea, and chills with pain "all over." Confirmed to be febrile to 103.65F and tachycardic with leukocytosis, grossly positive urinalysis, elevated lactic acid to 3.1. Blood cultures drawn, IV fluid boluses and ceftriaxone given on admission for sepsis due to CAUTI. A single aerobic blood culture has grown GPC (Staph spp. by rapid ID) for which vancomycin is started pending further results.   Assessment & Plan: Principal Problem:   UTI (urinary tract infection) Active Problems:   Neurogenic bladder   Severe sepsis (HCC)   Chronic anemia   Chronic indwelling Foley catheter  Severe sepsis due to CAUTI: - Continue ceftriaxone (based on previous urine Cx and urine culture growing GNRs [not GPC]) - Leukocytosis resolved, pt remains afebrile since admission, LA cleared prior to addition of vancomycin arguing against GPC being true infection.   GPC in aerobic blood culture bottle: Suspected contaminant. Non-aureus staphylococcus thus far is known.  - Continue monitoring.   Quadriplegia, neurogenic bladder, s/p SCI: - Restarted home antispasmodics.   Chronic pain syndrome: Pain reported is severe but without focal signs on exam or reported focal symptoms. Abd, specifically, is benign at this time. Pt without emesis, complaining of fentanyl IV wearing off quickly. - Will substitute oxycodone for home med tramadol for now. No indication for IV medications at this time. - Schedule tylenol as fever may be contributing.   Chronic iron deficiency anemia: Has required transfusions during  prior hospitalizations. - Transfuse 1u PRBCs for hgb <7g/dl.    Depression, anxiety: - Restarted diazepam (confirmed on PDMP)  Obesity: Estimated body mass index is 35.84 kg/m as calculated from the following:   Height as of this encounter: 5\' 5"  (1.651 m).   Weight as of this encounter: 97.7 kg.  DVT prophylaxis: Lovenox Code Status: Full Family Communication: At bedside per pt request Disposition Plan:  Status is: Inpatient  Remains inpatient appropriate because:Ongoing diagnostic testing needed not appropriate for outpatient work up and Inpatient level of care appropriate due to severity of illness  Dispo: The patient is from: Home              Anticipated d/c is to: Home              Patient currently is not medically stable to d/c.   Difficult to place patient No  Consultants:  None  Procedures:  1u PRBCs 7/8  Antimicrobials: Ceftriaxone Vancomycin 7/8   Subjective: Still feels chills and pain everywhere, not particularly chest or abdomen. No other issues. Denies bleeding.   Objective: Vitals:   12/27/20 0345 12/27/20 0348 12/27/20 0602 12/27/20 0735  BP: (!) 85/50 (!) 90/56 100/64 107/70  Pulse: 79  81 87  Resp: 18   15  Temp: 99 F (37.2 C)   98.3 F (36.8 C)  TempSrc: Oral   Oral  SpO2: 97%   97%  Weight:      Height:        Intake/Output Summary (Last 24 hours) at 12/27/2020 0849 Last data filed at 12/27/2020 0804 Gross per 24 hour  Intake --  Output 2600 ml  Net -2600 ml   [employer]  12/26/20 0222 12/26/20 1224  Weight: 99.8 kg 97.7 kg    Gen: 40 y.o. female in no distress Pulm: Non-labored breathing. Clear to auscultation bilaterally.  CV: Regular rate and rhythm. No murmur, rub, or gallop. No JVD, no pitting pedal edema. GI: Abdomen soft, non-tender, non-distended, with normoactive bowel sounds. No organomegaly or masses felt. Ext: Warm, LE plegia > UE incomplete weakness with spastic hand deformities.  Skin: No rashes, lesions or  ulcers on visualized skin Neuro: Alert and oriented. No focal neurological deficits. Psych: Judgement and insight appear normal. Mood & affect appropriate.   Data Reviewed: I have personally reviewed following labs and imaging studies  CBC: Recent Labs  Lab 12/26/20 0000 12/26/20 0255 12/27/20 0542  WBC 11.8* 12.1* 8.1  NEUTROABS 7.9*  --  4.5  HGB 7.3* 7.2* 6.6*  HCT 23.4* 23.6* 22.2*  MCV 73.4* 73.8* 75.5*  PLT 455* 422* 169   Basic Metabolic Panel: Recent Labs  Lab 12/26/20 0000 12/27/20 0542  NA 133* 136  K 3.9 3.3*  CL 103 106  CO2 21* 24  GLUCOSE 106* 118*  BUN 10 8  CREATININE 0.71 0.63  CALCIUM 8.5* 7.9*   GFR: Estimated Creatinine Clearance: 109.3 mL/min (by C-G formula based on SCr of 0.63 mg/dL). Liver Function Tests: Recent Labs  Lab 12/26/20 0000  AST 23  ALT 18  ALKPHOS 85  BILITOT 0.3  PROT 7.2  ALBUMIN 3.2*   No results for input(s): LIPASE, AMYLASE in the last 168 hours. No results for input(s): AMMONIA in the last 168 hours. Coagulation Profile: Recent Labs  Lab 12/26/20 0024  INR 1.1   Cardiac Enzymes: No results for input(s): CKTOTAL, CKMB, CKMBINDEX, TROPONINI in the last 168 hours. BNP (last 3 results) No results for input(s): PROBNP in the last 8760 hours. HbA1C: No results for input(s): HGBA1C in the last 72 hours. CBG: No results for input(s): GLUCAP in the last 168 hours. Lipid Profile: No results for input(s): CHOL, HDL, LDLCALC, TRIG, CHOLHDL, LDLDIRECT in the last 72 hours. Thyroid Function Tests: No results for input(s): TSH, T4TOTAL, FREET4, T3FREE, THYROIDAB in the last 72 hours. Anemia Panel: No results for input(s): VITAMINB12, FOLATE, FERRITIN, TIBC, IRON, RETICCTPCT in the last 72 hours. Urine analysis:    Component Value Date/Time   COLORURINE YELLOW 12/26/2020 0000   APPEARANCEUR CLOUDY (A) 12/26/2020 0000   LABSPEC 1.008 12/26/2020 0000   PHURINE 6.0 12/26/2020 0000   GLUCOSEU NEGATIVE 12/26/2020 0000    HGBUR SMALL (A) 12/26/2020 0000   HGBUR moderate 01/10/2010 1430   BILIRUBINUR NEGATIVE 12/26/2020 0000   BILIRUBINUR negative 05/11/2018 1045   BILIRUBINUR NEG 02/19/2012 1440   KETONESUR NEGATIVE 12/26/2020 0000   PROTEINUR NEGATIVE 12/26/2020 0000   UROBILINOGEN 1.0 05/11/2018 1045   UROBILINOGEN 1.0 04/01/2014 0918   NITRITE POSITIVE (A) 12/26/2020 0000   LEUKOCYTESUR LARGE (A) 12/26/2020 0000   Recent Results (from the past 240 hour(s))  Blood culture (routine x 2)     Status: None (Preliminary result)   Collection Time: 12/26/20 12:00 AM   Specimen: BLOOD  Result Value Ref Range Status   Specimen Description   Final    BLOOD BLOOD LEFT WRIST Performed at [facility], [place] [address] [place], [place] [address]    Special Requests   Final    BOTTLES DRAWN AEROBIC AND ANAEROBIC Blood Culture adequate volume Performed at [facility] [address] [place], [place] [address]    Culture  Setup Time  Final    GRAM POSITIVE COCCI IN CLUSTERS AEROBIC BOTTLE ONLY CRITICAL RESULT CALLED TO, READ BACK BY AND VERIFIED WITH: [place] [id number] FCP Performed at [facility], [place] [address], [place], [place] [address]    Culture GRAM POSITIVE COCCI  Final   Report Status PENDING  Incomplete  Urine culture     Status: Abnormal (Preliminary result)   Collection Time: 12/26/20 12:00 AM   Specimen: Urine, Clean Catch  Result Value Ref Range Status   Specimen Description   Final    URINE, CLEAN CATCH Performed at [facility], [place] [address] [place], [place] [address]    Special Requests   Final    NONE Performed at [facility], [place] [address], [place], [place] [address]    Culture >=100,000 COLONIES/mL GRAM NEGATIVE RODS (A)  Final   Report Status PENDING  Incomplete  Resp Panel by RT-PCR (Flu A&B, Covid) Nasopharyngeal Swab     Status: None   Collection Time: 12/26/20 12:00 AM    Specimen: Nasopharyngeal Swab; Nasopharyngeal(NP) swabs in vial transport medium  Result Value Ref Range Status   SARS Coronavirus 2 by RT PCR NEGATIVE NEGATIVE Final    Comment: (NOTE) SARS-CoV-2 target nucleic acids are NOT DETECTED.  The SARS-CoV-2 RNA is generally detectable in upper respiratory specimens during the acute phase of infection. The lowest concentration of SARS-CoV-2 viral copies this assay can detect is 138 copies/mL. A negative result does not preclude SARS-Cov-2 infection and should not be used as the sole basis for treatment or other patient management decisions. A negative result may occur with  improper specimen collection/handling, submission of specimen other than nasopharyngeal swab, presence of viral mutation(s) within the areas targeted by this assay, and inadequate number of viral copies(<138 copies/mL). A negative result must be combined with clinical observations, patient history, and epidemiological information. The expected result is Negative.  Fact Sheet for Patients:  EntrepreneurPulse.com.au  Fact Sheet for Healthcare Providers:  IncredibleEmployment.be  This test is no t yet approved or cleared by the [place] FDA and  has been authorized for detection and/or diagnosis of SARS-CoV-2 by FDA under an Emergency Use Authorization (EUA). This EUA will remain  in effect (meaning this test can be used) for the duration of the COVID-19 declaration under Section 564(b)(1) of the Act, 21 U.S.C.section 360bbb-3(b)(1), unless the authorization is terminated  or revoked sooner.       Influenza A by PCR NEGATIVE NEGATIVE Final   Influenza B by PCR NEGATIVE NEGATIVE Final    Comment: (NOTE) The Xpert Xpress SARS-CoV-2/FLU/RSV plus assay is intended as an aid in the diagnosis of influenza from Nasopharyngeal swab specimens and should not be used as a sole basis for treatment. Nasal washings and aspirates are  unacceptable for Xpert Xpress SARS-CoV-2/FLU/RSV testing.  Fact Sheet for Patients: EntrepreneurPulse.com.au  Fact Sheet for Healthcare Providers: IncredibleEmployment.be  This test is not yet approved or cleared by the [place] FDA and has been authorized for detection and/or diagnosis of SARS-CoV-2 by FDA under an Emergency Use Authorization (EUA). This EUA will remain in effect (meaning this test can be used) for the duration of the COVID-19 declaration under Section 564(b)(1) of the Act, 21 U.S.C. section 360bbb-3(b)(1), unless the authorization is terminated or revoked.  Performed at [facility], [place] [address], [place], [place] [address]   Blood Culture ID Panel (Reflexed)     Status: Abnormal   Collection Time: 12/26/20  12:00 AM  Result Value Ref Range Status   Enterococcus faecalis NOT DETECTED NOT DETECTED Final   Enterococcus Faecium NOT DETECTED NOT DETECTED Final   Listeria monocytogenes NOT DETECTED NOT DETECTED Final   Staphylococcus species DETECTED (A) NOT DETECTED Final    Comment: CRITICAL RESULT CALLED TO, READ BACK BY AND VERIFIED WITH: PHARMD MICHELLE L. [id number] FCP    Staphylococcus aureus (BCID) NOT DETECTED NOT DETECTED Final   Staphylococcus epidermidis NOT DETECTED NOT DETECTED Final   Staphylococcus lugdunensis NOT DETECTED NOT DETECTED Final   Streptococcus species NOT DETECTED NOT DETECTED Final   Streptococcus agalactiae NOT DETECTED NOT DETECTED Final   Streptococcus pneumoniae NOT DETECTED NOT DETECTED Final   Streptococcus pyogenes NOT DETECTED NOT DETECTED Final   A.calcoaceticus-baumannii NOT DETECTED NOT DETECTED Final   Bacteroides fragilis NOT DETECTED NOT DETECTED Final   Enterobacterales NOT DETECTED NOT DETECTED Final   Enterobacter cloacae complex NOT DETECTED NOT DETECTED Final   Escherichia coli NOT DETECTED NOT DETECTED Final   Klebsiella aerogenes NOT DETECTED NOT  DETECTED Final   Klebsiella oxytoca NOT DETECTED NOT DETECTED Final   Klebsiella pneumoniae NOT DETECTED NOT DETECTED Final   Proteus species NOT DETECTED NOT DETECTED Final   Salmonella species NOT DETECTED NOT DETECTED Final   Serratia marcescens NOT DETECTED NOT DETECTED Final   Haemophilus influenzae NOT DETECTED NOT DETECTED Final   Neisseria meningitidis NOT DETECTED NOT DETECTED Final   Pseudomonas aeruginosa NOT DETECTED NOT DETECTED Final   Stenotrophomonas maltophilia NOT DETECTED NOT DETECTED Final   Candida albicans NOT DETECTED NOT DETECTED Final   Candida auris NOT DETECTED NOT DETECTED Final   Candida glabrata NOT DETECTED NOT DETECTED Final   Candida krusei NOT DETECTED NOT DETECTED Final   Candida parapsilosis NOT DETECTED NOT DETECTED Final   Candida tropicalis NOT DETECTED NOT DETECTED Final   Cryptococcus neoformans/gattii NOT DETECTED NOT DETECTED Final    Comment: Performed at [facility], [address] [place], [place] [address]  Blood culture (routine x 2)     Status: None (Preliminary result)   Collection Time: 12/26/20 12:04 AM   Specimen: BLOOD  Result Value Ref Range Status   Specimen Description   Final    BLOOD RIGHT ANTECUBITAL Performed at [facility] [address], [place], [place] [address]    Special Requests   Final    BOTTLES DRAWN AEROBIC AND ANAEROBIC Blood Culture adequate volume Performed at [facility] [address] [place], [place] [address]    Culture   Final    NO GROWTH 1 DAY Performed at [facility], [place] [address], [place],  [address]    Report Status PENDING  Incomplete      Radiology Studies: DG Chest Port 1 View  Result Date: 12/26/2020 CLINICAL DATA:  Fever, sharp cysts EXAM: PORTABLE CHEST 1 VIEW COMPARISON:  10/11/2020 FINDINGS: Lungs are clear.  No pleural effusion or pneumothorax. The heart is normal in size. Shrapnel overlying the lower neck, unchanged.  IMPRESSION: No evidence of acute cardiopulmonary disease. Electronically Signed   By: Julian Hy M.D.   On: 12/26/2020 01:01    Scheduled Meds:  sodium chloride   Intravenous Once   acetaminophen  650 mg Oral Q6H   Chlorhexidine Gluconate Cloth  6 each Topical Daily   diazepam  5 mg Oral BID   enoxaparin (LOVENOX) injection  50 mg Subcutaneous Q24H   potassium chloride  40 mEq Oral Once  tiZANidine  4 mg Oral BID   Continuous Infusions:  cefTRIAXone (ROCEPHIN)  IV Stopped (12/27/20 0128)   vancomycin 2,000 mg (12/27/20 0804)     LOS: 1 day   Time spent: 35 minutes.  Patrecia Pour, MD [facility] [email or web address] 12/27/2020, 8:49 AM

## 2020-12-27 NOTE — Progress Notes (Signed)
Pharmacy Antibiotic Note  Tracey Hall is a 40 y.o. female admitted on 12/25/2020 with sepsis due to CAUTI and placed on empiric ceftriaxone.  Temperature and WBC are improved but this morning received [facility] alert for positive blood culture, gram positive cocci in clusters,  in aerobic bottle only.  Called result to hospitalist night coverage and obtained order to begin vancomycin for now.   Plan: Vancomycin 2 grams IV x 1 Continue ceftriaxone  Await additional microbiology data  Height: 5\' 5"  (165.1 cm) Weight: 97.7 kg (215 lb 6.2 oz) IBW/kg (Calculated) : 57  Temp (24hrs), Avg:99.5 F (37.5 C), Min:99 F (37.2 C), Max:99.9 F (37.7 C)  Recent Labs  Lab 12/26/20 0000 12/26/20 0255 12/27/20 0542  WBC 11.8* 12.1* 8.1  CREATININE 0.71  --  0.63  LATICACIDVEN 3.1* 1.3  --     Estimated Creatinine Clearance: 109.3 mL/min (by C-G formula based on SCr of 0.63 mg/dL).    Allergies  Allergen Reactions   Cortisone Rash    Was told never to use cortisone again- "had a severe rash" with it   Latex Rash    Antimicrobials this admission: 7/7 ceftriaxone>>  7/8 vancomycin >>   Dose adjustments this admission:   Microbiology results: 7/7 BCx: GPC cocci in clusters, aerobic bottle only 7/7 UCx: collected  Thank you for allowing pharmacy to be a part of this patient's care.   Clayburn Pert, PharmD, BCPS 12/27/2020  6:44 AM   Addendum: Subsequently microbiology lab called Pharmacy with BCID report : currently Staphylococcus species in 1 of 4.   Will review this additional information with oncoming attending physician this AM.

## 2020-12-27 NOTE — Plan of Care (Signed)
Care plan initiated. Kjones, RN 

## 2020-12-27 NOTE — Progress Notes (Signed)
   12/27/20 3202  Provider Notification  Provider Name/Title Clarene Essex, NP  Date Provider Notified 12/27/20  Time Provider Notified [phone]  Notification Type Page  Notification Reason Critical result (Jolan from Lab reported critical lab (Hgb 6.6))  Date Critical Result Received 12/27/20  Time Critical Result Received 0629  No orders rec'ed at this time. Neomia Dear, RN

## 2020-12-27 NOTE — Progress Notes (Signed)
   12/27/20 0101  Assess: MEWS Score  Temp 99.8 F (37.7 C)  BP (!) 75/53  Pulse Rate 82  Resp 16  Level of Consciousness Alert  SpO2 96 %  O2 Device Room Air  Assess: MEWS Score  MEWS Temp 0  MEWS Systolic 2  MEWS Pulse 0  MEWS RR 0  MEWS LOC 0  MEWS Score 2  MEWS Score Color Yellow  Treat  Pain Scale 0-10  Pain Score 8  Pain Type Acute pain  Pain Location Generalized  Pain Intervention(s) MD notified (Comment);Medication (See eMAR)  Take Vital Signs  Increase Vital Sign Frequency  Yellow: Q 2hr X 2 then Q 4hr X 2, if remains yellow, continue Q 4hrs  Escalate  MEWS: Escalate Yellow: discuss with charge nurse/RN and consider discussing with provider and RRT  Notify: Charge Nurse/RN  Name of Charge Nurse/RN Notified Renita, RN  Date Charge Nurse/RN Notified 12/27/20  Time Charge Nurse/RN Notified 0140  Notify: Provider  Provider Name/Title Lavon Paganini, NP  Date Provider Notified 12/27/20  Time Provider Notified 0116  Notification Type Page  Notification Reason Change in status  Provider response See new orders  Date of Provider Response 12/27/20  Time of Provider Response 0120  Assess: SIRS CRITERIA  SIRS Temperature  0  SIRS Pulse 0  SIRS Respirations  0  SIRS WBC 0  SIRS Score Sum  0  Pt with change in status, automatic BP 75/53, rechecked manually 84/62, pt. Remains feeling fatigue, focus assessment completed, no changes, pt. Denies chest pain or shortness of breath. Remains alert and oriented, no distress noted at this time. Significant other remains at the bedside, approval received from [facility]. Will continue to monitor pt. Closely. Kjones RN

## 2020-12-27 NOTE — Progress Notes (Signed)
   12/27/20 6219  Provider Notification  Provider Name/Title Clarene Essex, NP  Date Provider Notified 12/27/20  Time Provider Notified [phone]  Notification Type Page  Notification Reason  (made provider aware, pt has not been consented for blood. Consent not located on chart or in epic. Neomia Dear, RN)

## 2020-12-28 LAB — CBC
HCT: 25.8 % — ABNORMAL LOW (ref 36.0–46.0)
Hemoglobin: 7.7 g/dL — ABNORMAL LOW (ref 12.0–15.0)
MCH: 23.4 pg — ABNORMAL LOW (ref 26.0–34.0)
MCHC: 29.8 g/dL — ABNORMAL LOW (ref 30.0–36.0)
MCV: 78.4 fL — ABNORMAL LOW (ref 80.0–100.0)
Platelets: 410 10*3/uL — ABNORMAL HIGH (ref 150–400)
RBC: 3.29 MIL/uL — ABNORMAL LOW (ref 3.87–5.11)
RDW: 20.7 % — ABNORMAL HIGH (ref 11.5–15.5)
WBC: 9.2 10*3/uL (ref 4.0–10.5)
nRBC: 0 % (ref 0.0–0.2)

## 2020-12-28 LAB — BASIC METABOLIC PANEL
Anion gap: 7 (ref 5–15)
BUN: 11 mg/dL (ref 6–20)
CO2: 22 mmol/L (ref 22–32)
Calcium: 8.4 mg/dL — ABNORMAL LOW (ref 8.9–10.3)
Chloride: 106 mmol/L (ref 98–111)
Creatinine, Ser: 0.55 mg/dL (ref 0.44–1.00)
GFR, Estimated: >60 mL/min (ref >60)
Glucose, Bld: 105 mg/dL — ABNORMAL HIGH (ref 70–99)
Potassium: 3.5 mmol/L (ref 3.5–5.1)
Sodium: 135 mmol/L (ref 135–145)

## 2020-12-28 LAB — URINE CULTURE: Culture: >=100000 — AB

## 2020-12-28 MED ORDER — OXYCODONE HCL 5 MG PO TABS: 5.0000 mg | ORAL_TABLET | Freq: Four times a day (QID) | ORAL | 0 refills | Status: AC | PRN

## 2020-12-28 NOTE — Discharge Summary (Signed)
Physician Discharge Summary  Tracey Hall QJJ:[id number] DOB: [birth year] DOA: 12/25/2020  PCP: Antony Blackbird, MD (Inactive)  Admit date: 12/25/2020 Discharge date: 12/28/2020  Admitted From: Home Disposition: Home   Recommendations for Outpatient Follow-up:  Follow up with PCP in 1-2 weeks.  Follow up with PM&R Dr. Dagoberto Ligas for continued pain management.  Monitor CBC. Required 1u PRBCs while admitted in the absence of bleeding.   Home Health: None new Equipment/Devices: None new Discharge Condition: Stable CODE STATUS: Full Diet recommendation: Regular  Brief/Interim Summary: Tracey Hall is a 40 y.o. [place] with a history of quadriplegia, neurogenic bladder w/chronic foley (last exchanged 7/3) due to SCI complicated by chronic pain syndrome, depression, anxiety and recurrent UTIs who presented to the ED 7/6 with fever, nausea, and chills with pain "all over." Confirmed to be febrile to 103.41F and tachycardic with leukocytosis, grossly positive urinalysis, elevated lactic acid to 3.1. Blood cultures drawn, IV fluid boluses and ceftriaxone given on admission for sepsis due to CAUTI. A single aerobic blood culture has grown GPC (S. capitis) for which vancomycin was given but subsequently stopped due to suspicion of contaminant. She has defervesced with resolution of leukocytosis and symptomatically improved prior to discharge.   Discharge Diagnoses:  Principal Problem:   UTI (urinary tract infection) Active Problems:   Neurogenic bladder   Severe sepsis (HCC)   Chronic anemia   Chronic indwelling Foley catheter  Severe sepsis due to CAUTI: Klebsiella growing with culture pending at time of discharge. Was prescribed keflex based on prior urine culture data which, now following discharge is identical to prior and susceptible to keflex.  - Leukocytosis resolved, pt remains afebrile since admission, LA cleared   S. capitis suspected blood culture contaminant in aerobic blood culture bottle:     Quadriplegia, neurogenic bladder, s/p SCI: - Restarted home antispasmodics.   Chronic pain syndrome: Pain reported is severe but without focal signs on exam or reported focal symptoms. Abd, specifically, is benign at this time. Pt without emesis, complaining of fentanyl IV wearing off quickly. - Will substitute oxycodone for home med tramadol for now. Give short course of this at discharge after PDMP was reviewed.    Chronic iron deficiency anemia: Has required transfusions during prior hospitalizations. - Transfused 1u PRBCs 7/8 with improvement to 7.7 and still no bleeding   Depression, anxiety: - Continue diazepam (confirmed on PDMP)   Obesity: Estimated body mass index is 35.84 kg/m  Discharge Instructions Discharge Instructions     Diet - low sodium heart healthy   Complete by: As directed    Discharge instructions   Complete by: As directed    You were admitted for UTI found to be due to Klebsiella (the same bacteria as a prior infection you had). Your fever has resolved, blood work has shown improvement and you are stable for discharge. Please continue taking keflex 500mg  twice daily for 7 days. You may also take oxycodone for severe breakthrough pain and restart your usual tramadol thereafter (don't overlap these medications). Follow up with Dr. Dagoberto Ligas for continue pain management and/or seek medical attention right away if your symptoms return.   Increase activity slowly   Complete by: As directed       Allergies as of 12/28/2020       Reactions   Cortisone Rash   Was told never to use cortisone again- "had a severe rash" with it   Latex Rash        Medication List  STOP taking these medications    dicyclomine 10 MG capsule Commonly known as: Bentyl   fluconazole 100 MG tablet Commonly known as: Diflucan   HYDROcodone-acetaminophen 5-325 MG tablet Commonly known as: Norco       TAKE these medications    diazepam 5 MG tablet Commonly known as:  VALIUM TAKE 1 TABLET(5 MG) BY MOUTH TWICE DAILY for spasms What changed:  how much to take how to take this when to take this reasons to take this additional instructions   diphenhydramine-acetaminophen 25-500 MG Tabs tablet Commonly known as: TYLENOL PM Take 1 tablet by mouth at bedtime as needed (pain).   docusate sodium 100 MG capsule Commonly known as: COLACE Take 1 capsule (100 mg total) by mouth 2 (two) times daily. What changed:  when to take this reasons to take this   DULoxetine 60 MG capsule Commonly known as: CYMBALTA TAKE 1 CAPSULE(60 MG) BY MOUTH DAILY What changed:  how much to take how to take this when to take this additional instructions Another medication with the same name was removed. Continue taking this medication, and follow the directions you see here.   ferrous sulfate 325 (65 FE) MG tablet Take 1 tablet (325 mg total) by mouth 2 (two) times daily with a meal. What changed:  when to take this reasons to take this   gabapentin 300 MG capsule Commonly known as: Neurontin Take 1 capsule (300 mg total) by mouth 3 (three) times daily. X 3 days, then 600 mg 2x/day- for nerve pain What changed:  how much to take additional instructions   oxyCODONE 5 MG immediate release tablet Commonly known as: Oxy IR/ROXICODONE Take 1-2 tablets (5-10 mg total) by mouth every 6 (six) hours as needed for up to 5 days for breakthrough pain.   phentermine 15 MG capsule Take 1 capsule (15 mg total) by mouth daily.   potassium chloride 10 MEQ tablet Commonly known as: KLOR-CON Take 40 mEq M/W/F What changed:  how much to take how to take this when to take this additional instructions   tiZANidine 4 MG tablet Commonly known as: ZANAFLEX TAKE 1 TABLET(4 MG) BY MOUTH TWICE DAILY What changed:  how much to take how to take this when to take this additional instructions   topiramate 25 MG tablet Commonly known as: TOPAMAX Take 1 tablet (25 mg total) by  mouth at bedtime.   traMADol 50 MG tablet Commonly known as: Ultram Take 1 tablet (50 mg total) by mouth every 6 (six) hours as needed. What changed: reasons to take this   traZODone 50 MG tablet Commonly known as: DESYREL TAKE 1 TABLET(50 MG) BY MOUTH AT BEDTIME AS NEEDED FOR SLEEP What changed: See the new instructions.        Follow-up Information     Fulp, Cammie, MD Follow up.   Specialty: Family Medicine Contact information: [place] [address] [phone]         Courtney Heys, MD. Schedule an appointment as soon as possible for a visit.   Specialty: Physical Medicine and Rehabilitation Contact information: [address] [place] [address] [phone]                Allergies  Allergen Reactions   Cortisone Rash    Was told never to use cortisone again- "had a severe rash" with it   Latex Rash    Consultations: None  Procedures/Studies: DG Chest Port 1 View  Result Date: 12/26/2020  CLINICAL DATA:  Fever, sharp cysts EXAM: PORTABLE CHEST 1 VIEW COMPARISON:  10/11/2020 FINDINGS: Lungs are clear.  No pleural effusion or pneumothorax. The heart is normal in size. Shrapnel overlying the lower neck, unchanged. IMPRESSION: No evidence of acute cardiopulmonary disease. Electronically Signed   By: Julian Hy M.D.   On: 12/26/2020 01:01      Subjective: Wants to go home. Feels overall better, albeit still with pain all over.   Discharge Exam: Vitals:   12/28/20 0436 12/28/20 0839  BP: (!) 97/47 111/69  Pulse: 92 85  Resp: 18 18  Temp: 98.3 F (36.8 C) 98.4 F (36.9 C)  SpO2: 100% 100%   General: Pt is alert, awake, not in acute distress Cardiovascular: RRR, S1/S2 +, no rubs, no gallops Respiratory: CTA bilaterally, no wheezing, no rhonchi Abdominal: Soft, NT, ND, bowel sounds + Extremities: No pitting edema. Paraplegia and hand deformities are stable.   Labs: Basic Metabolic Panel: Recent Labs   Lab 12/26/20 0000 12/27/20 0542 12/28/20 0529  NA 133* 136 135  K 3.9 3.3* 3.5  CL 103 106 106  CO2 21* 24 22  GLUCOSE 106* 118* 105*  BUN 10 8 11   CREATININE 0.71 0.63 0.55  CALCIUM 8.5* 7.9* 8.4*   Liver Function Tests: Recent Labs  Lab 12/26/20 0000  AST 23  ALT 18  ALKPHOS 85  BILITOT 0.3  PROT 7.2  ALBUMIN 3.2*   No results for input(s): LIPASE, AMYLASE in the last 168 hours. No results for input(s): AMMONIA in the last 168 hours. CBC: Recent Labs  Lab 12/26/20 0000 12/26/20 0255 12/27/20 0542 12/28/20 0529  WBC 11.8* 12.1* 8.1 9.2  NEUTROABS 7.9*  --  4.5  --   HGB 7.3* 7.2* 6.6* 7.7*  HCT 23.4* 23.6* 22.2* 25.8*  MCV 73.4* 73.8* 75.5* 78.4*  PLT 455* 422* 372 410*   Cardiac Enzymes: No results for input(s): CKTOTAL, CKMB, CKMBINDEX, TROPONINI in the last 168 hours. BNP: Invalid input(s): POCBNP CBG: No results for input(s): GLUCAP in the last 168 hours. D-Dimer No results for input(s): DDIMER in the last 72 hours. Hgb A1c No results for input(s): HGBA1C in the last 72 hours. Lipid Profile No results for input(s): CHOL, HDL, LDLCALC, TRIG, CHOLHDL, LDLDIRECT in the last 72 hours. Thyroid function studies No results for input(s): TSH, T4TOTAL, T3FREE, THYROIDAB in the last 72 hours.  Invalid input(s): FREET3 Anemia work up No results for input(s): VITAMINB12, FOLATE, FERRITIN, TIBC, IRON, RETICCTPCT in the last 72 hours. Urinalysis    Component Value Date/Time   COLORURINE YELLOW 12/26/2020 0000   APPEARANCEUR CLOUDY (A) 12/26/2020 0000   LABSPEC 1.008 12/26/2020 0000   PHURINE 6.0 12/26/2020 0000   GLUCOSEU NEGATIVE 12/26/2020 0000   HGBUR SMALL (A) 12/26/2020 0000   HGBUR moderate 01/10/2010 1430   BILIRUBINUR NEGATIVE 12/26/2020 0000   BILIRUBINUR negative 05/11/2018 1045   BILIRUBINUR NEG 02/19/2012 1440   KETONESUR NEGATIVE 12/26/2020 0000   PROTEINUR NEGATIVE 12/26/2020 0000   UROBILINOGEN 1.0 05/11/2018 1045   UROBILINOGEN 1.0  04/01/2014 0918   NITRITE POSITIVE (A) 12/26/2020 0000   LEUKOCYTESUR LARGE (A) 12/26/2020 0000    Microbiology Recent Results (from the past 240 hour(s))  Blood culture (routine x 2)     Status: None (Preliminary result)   Collection Time: 12/26/20 12:00 AM   Specimen: BLOOD  Result Value Ref Range Status   Specimen Description   Final    BLOOD BLOOD LEFT WRIST Performed at [facility], [address] [place], [place] [address]    Special Requests   Final    BOTTLES DRAWN AEROBIC AND ANAEROBIC Blood Culture adequate volume Performed at [facility] [address] [place], [place] [address]    Culture  Setup Time   Final    GRAM POSITIVE COCCI IN CLUSTERS AEROBIC BOTTLE ONLY CRITICAL RESULT CALLED TO, READ BACK BY AND VERIFIED WITH: [place] [id number] FCP Performed at [facility], [place] [address] [place], [place] [address]    Culture GRAM POSITIVE COCCI  Final   Report Status PENDING  Incomplete  Urine culture     Status: Abnormal (Preliminary result)   Collection Time: 12/26/20 12:00 AM   Specimen: Urine, Clean Catch  Result Value Ref Range Status   Specimen Description   Final    URINE, CLEAN CATCH Performed at [facility], [place] [address], [place], [place] [address]    Special Requests   Final    NONE Performed at [facility], [place] [address], [place], [place] [address]    Culture >=100,000 COLONIES/mL KLEBSIELLA PNEUMONIAE (A)  Final   Report Status PENDING  Incomplete  Resp Panel by RT-PCR (Flu A&B, Covid) Nasopharyngeal Swab     Status: None   Collection Time: 12/26/20 12:00 AM   Specimen: Nasopharyngeal Swab; Nasopharyngeal(NP) swabs in vial transport medium  Result Value Ref Range Status   SARS Coronavirus 2 by RT PCR NEGATIVE NEGATIVE Final    Comment: (NOTE) SARS-CoV-2 target nucleic acids are NOT DETECTED.  The SARS-CoV-2 RNA is generally detectable in upper  respiratory specimens during the acute phase of infection. The lowest concentration of SARS-CoV-2 viral copies this assay can detect is 138 copies/mL. A negative result does not preclude SARS-Cov-2 infection and should not be used as the sole basis for treatment or other patient management decisions. A negative result may occur with  improper specimen collection/handling, submission of specimen other than nasopharyngeal swab, presence of viral mutation(s) within the areas targeted by this assay, and inadequate number of viral copies(<138 copies/mL). A negative result must be combined with clinical observations, patient history, and epidemiological information. The expected result is Negative.  Fact Sheet for Patients:  EntrepreneurPulse.com.au  Fact Sheet for Healthcare Providers:  IncredibleEmployment.be  This test is no t yet approved or cleared by the [place] FDA and  has been authorized for detection and/or diagnosis of SARS-CoV-2 by FDA under an Emergency Use Authorization (EUA). This EUA will remain  in effect (meaning this test can be used) for the duration of the COVID-19 declaration under Section 564(b)(1) of the Act, 21 U.S.C.section 360bbb-3(b)(1), unless the authorization is terminated  or revoked sooner.       Influenza A by PCR NEGATIVE NEGATIVE Final   Influenza B by PCR NEGATIVE NEGATIVE Final    Comment: (NOTE) The Xpert Xpress SARS-CoV-2/FLU/RSV plus assay is intended as an aid in the diagnosis of influenza from Nasopharyngeal swab specimens and should not be used as a sole basis for treatment. Nasal washings and aspirates are unacceptable for Xpert Xpress SARS-CoV-2/FLU/RSV testing.  Fact Sheet for Patients: EntrepreneurPulse.com.au  Fact Sheet for Healthcare Providers: IncredibleEmployment.be  This test is not yet approved or cleared by the [place] FDA and has been  authorized for detection and/or diagnosis of SARS-CoV-2 by FDA under an Emergency Use Authorization (EUA). This EUA will remain in effect (meaning this test can be used) for the duration of the COVID-19 declaration under Section  564(b)(1) of the Act, 21 U.S.C. section 360bbb-3(b)(1), unless the authorization is terminated or revoked.  Performed at [facility], [place] [address], [place], [place] [address]   Blood Culture ID Panel (Reflexed)     Status: Abnormal   Collection Time: 12/26/20 12:00 AM  Result Value Ref Range Status   Enterococcus faecalis NOT DETECTED NOT DETECTED Final   Enterococcus Faecium NOT DETECTED NOT DETECTED Final   Listeria monocytogenes NOT DETECTED NOT DETECTED Final   Staphylococcus species DETECTED (A) NOT DETECTED Final    Comment: CRITICAL RESULT CALLED TO, READ BACK BY AND VERIFIED WITH: PHARMD MICHELLE L. [id number] FCP    Staphylococcus aureus (BCID) NOT DETECTED NOT DETECTED Final   Staphylococcus epidermidis NOT DETECTED NOT DETECTED Final   Staphylococcus lugdunensis NOT DETECTED NOT DETECTED Final   Streptococcus species NOT DETECTED NOT DETECTED Final   Streptococcus agalactiae NOT DETECTED NOT DETECTED Final   Streptococcus pneumoniae NOT DETECTED NOT DETECTED Final   Streptococcus pyogenes NOT DETECTED NOT DETECTED Final   A.calcoaceticus-baumannii NOT DETECTED NOT DETECTED Final   Bacteroides fragilis NOT DETECTED NOT DETECTED Final   Enterobacterales NOT DETECTED NOT DETECTED Final   Enterobacter cloacae complex NOT DETECTED NOT DETECTED Final   Escherichia coli NOT DETECTED NOT DETECTED Final   Klebsiella aerogenes NOT DETECTED NOT DETECTED Final   Klebsiella oxytoca NOT DETECTED NOT DETECTED Final   Klebsiella pneumoniae NOT DETECTED NOT DETECTED Final   Proteus species NOT DETECTED NOT DETECTED Final   Salmonella species NOT DETECTED NOT DETECTED Final   Serratia marcescens NOT DETECTED NOT DETECTED Final    Haemophilus influenzae NOT DETECTED NOT DETECTED Final   Neisseria meningitidis NOT DETECTED NOT DETECTED Final   Pseudomonas aeruginosa NOT DETECTED NOT DETECTED Final   Stenotrophomonas maltophilia NOT DETECTED NOT DETECTED Final   Candida albicans NOT DETECTED NOT DETECTED Final   Candida auris NOT DETECTED NOT DETECTED Final   Candida glabrata NOT DETECTED NOT DETECTED Final   Candida krusei NOT DETECTED NOT DETECTED Final   Candida parapsilosis NOT DETECTED NOT DETECTED Final   Candida tropicalis NOT DETECTED NOT DETECTED Final   Cryptococcus neoformans/gattii NOT DETECTED NOT DETECTED Final    Comment: Performed at [facility], [address], [place], [place] [address]  Blood culture (routine x 2)     Status: None (Preliminary result)   Collection Time: 12/26/20 12:04 AM   Specimen: BLOOD  Result Value Ref Range Status   Specimen Description   Final    BLOOD RIGHT ANTECUBITAL Performed at [facility] [address], [place], [place] [address]    Special Requests   Final    BOTTLES DRAWN AEROBIC AND ANAEROBIC Blood Culture adequate volume Performed at [facility] [address], [place], [place] [address]    Culture   Final    NO GROWTH 1 DAY Performed at [facility], [place] [address], [place], [place] [address]    Report Status PENDING  Incomplete    Time coordinating discharge: Approximately 40 minutes  Patrecia Pour, MD  [facility] 12/28/2020, 8:43 AM

## 2020-12-29 LAB — CULTURE, BLOOD (ROUTINE X 2): Special Requests: ADEQUATE

## 2020-12-29 LAB — TYPE AND SCREEN
ABO/RH(D): O POS
Antibody Screen: NEGATIVE
Unit division: 0

## 2020-12-29 LAB — BPAM RBC
Blood Product Expiration Date: 202208092359
ISSUE DATE / TIME: 202207081342
Unit Type and Rh: 5100

## 2020-12-30 ENCOUNTER — Telehealth: Payer: Self-pay

## 2020-12-30 NOTE — Telephone Encounter (Signed)
Transition Care Management Unsuccessful Follow-up Telephone Call  Date of discharge and from where:  12/28/2020, [facility]   Attempts:  1st Attempt  Reason for unsuccessful TCM follow-up call:  Left voice message with Ray Church.  He said that she was not home at the time. Requested that he have patient return the call to this CM.   Dr Chapman Fitch is listed as patient's PCP, need  to inquire if patient plans to follow up at [facility] with new PCP or will establish care elsewhere

## 2020-12-31 ENCOUNTER — Telehealth: Payer: Self-pay

## 2020-12-31 LAB — CULTURE, BLOOD (ROUTINE X 2)
Culture: NO GROWTH
Special Requests: ADEQUATE

## 2020-12-31 NOTE — Telephone Encounter (Signed)
Message received from Dr Bonner Puna noting that he called patient's pharmacy and the medication should be ready shortly.  Attempted to contact patient to inform her of above and that an appointment has been scheduled with Ms Minette Brine, NP 01/06/2021 @ 1050.  Message left on # [phone], call back requested to this CM. Call  also placed to # [phone] and the phone just rinds, no option to leave a message.

## 2020-12-31 NOTE — Telephone Encounter (Signed)
Transition Care Management Follow-up Telephone Call Date of discharge and from where: 12/28/2020, [facility]  How have you been since you were released from the hospital? She explained that she never received her antibiotic and her body is in pain and she is very concerned about sepsis and UTI.  Any questions or concerns? Yes - noted above. Informed her that this CM will request assistance from the hospital unit patient where patient was discharged. Secure chat sent to Dr Bonner Puna requesting assistance and informing him that patient stated her pharmacy never received a prescription for keflex.  It is noted on her AVS that she was to continue taking it for 7 days and she said she is now experiencing pain all over and is concerned about sepsis.  This CM also called [facility] and spoke to [facility], Engineer, site requesting assistance with this matter.  Items Reviewed: Did the pt receive and understand the discharge instructions provided? Yes  Medications obtained and verified?  She said that she has all of her medication except the keflex.  It is noted on her AVS that she should continue taking the keflex twice daily x 7 days.  Other? No  Any new allergies since your discharge? No  Dietary orders reviewed? No Do you have support at home? Yes  - her husband  Dent and Equipment/Supplies: Were home health services ordered? no If so, what is the name of the agency? N/a  Has the agency set up a time to come to the patient's home? not applicable Were any new equipment or medical supplies ordered?  No What is the name of the medical supply agency? N/a Were you able to get the supplies/equipment? not applicable Do you have any questions related to the use of the equipment or supplies? No  She has a foley catheter.  Functional Questionnaire: (I = Independent and D = Dependent) ADLs: needs assistance which she said her husband provides.     Follow up appointments reviewed:  PCP Hospital f/u  appt confirmed? Yes  Scheduled to see Durene Fruits, NP on 01/03/2021 @ 1050. [facility] f/u appt confirmed? Yes  Scheduled to see Dr Dagoberto Ligas on 02/03/2021.  @ Are transportation arrangements needed? No  If their condition worsens, is the pt aware to call PCP or go to the Emergency Dept.? Yes Was the patient provided with contact information for the PCP's office or ED? Yes Was to pt encouraged to call back with questions or concerns? Yes

## 2020-12-31 NOTE — Telephone Encounter (Signed)
Call received from patient and informed her Dr Bonner Puna called in the order for the antibiotic and it should be ready shortly. She was very appreciative  Regarding the appointment, She explained that she just started a new job and was hesitant to take time off because she would be in training.  Rescheduled her for 02/03/2021 @ 1050.

## 2021-01-06 ENCOUNTER — Ambulatory Visit: Payer: Medicare Other | Admitting: Family

## 2021-01-20 ENCOUNTER — Ambulatory Visit: Payer: Medicare Other | Admitting: Physical Medicine and Rehabilitation

## 2021-02-01 NOTE — Progress Notes (Signed)
Virtual Visit via Telephone Note  I connected with Tracey Hall, on 02/03/2021 at 12:11 PM by telephone due to the COVID-19 pandemic and verified that I am speaking with the correct person using two identifiers.  Due to current restrictions/limitations of in-office visits due to the COVID-19 pandemic, this scheduled clinical appointment was converted to a telehealth visit.   Consent: I discussed the limitations, risks, security and privacy concerns of performing an evaluation and management service by telephone and the availability of in person appointments. I also discussed with the patient that there may be a patient responsible charge related to this service. The patient expressed understanding and agreed to proceed.   Location of Patient: Home  Location of Provider: [facility] [place] participating in Telemedicine visit: [place], NP Elmon Else, [place] OF CARE VISIT   Primary Care Provider (PCP):    Camillia Herter, NP                                                      [facility] [address] [place],  [place]  [address] Phone: [phone] Fax: [phone]     Date of Admission: 12/25/2020  Date of Discharge: 12/28/2020  Transitions of Care Call: 12/31/2020 with Eden Lathe, RN  Discharged from: [facility]  Discharge Diagnosis:  Principal Problem:   UTI (urinary tract infection) Active Problems:   Neurogenic bladder   Severe sepsis (HCC)   Chronic anemia   Chronic indwelling Foley catheter  Summary of Admission per MD note:  Brief/Interim Summary: Tracey Hall is a 40 y.o. Tracey Hall with a history of quadriplegia, neurogenic bladder w/chronic foley (last exchanged 7/3) due to SCI complicated by chronic pain syndrome, depression, anxiety and recurrent UTIs who presented to the ED 7/6 with fever, nausea, and chills with pain "all over."  Confirmed to be febrile to 103.63F and tachycardic with leukocytosis, grossly positive urinalysis, elevated lactic acid to 3.1. Blood cultures drawn, IV fluid boluses and ceftriaxone given on admission for sepsis due to CAUTI. A single aerobic blood culture has grown GPC (S. capitis) for which vancomycin was given but subsequently stopped due to suspicion of contaminant. She has defervesced with resolution of leukocytosis and symptomatically improved prior to discharge.    Severe sepsis due to CAUTI: Klebsiella growing with culture pending at time of discharge. Was prescribed keflex based on prior urine culture data which, now following discharge is identical to prior and susceptible to keflex.  - Leukocytosis resolved, pt remains afebrile since admission, LA cleared   S. capitis suspected blood culture contaminant in aerobic blood culture bottle:    Quadriplegia, neurogenic bladder, s/p SCI: - Restarted home antispasmodics.   Chronic pain syndrome: Pain reported is severe but without focal signs on exam or reported focal symptoms. Abd, specifically, is benign at this time. Pt without emesis, complaining of fentanyl IV wearing off quickly. - Will substitute oxycodone for home med tramadol for now. Give short course of this at discharge after PDMP was reviewed.    Chronic iron deficiency anemia: Has required transfusions during prior hospitalizations. - Transfused 1u PRBCs 7/8 with improvement to 7.7 and still no bleeding   Depression, anxiety: - Continue diazepam (confirmed on PDMP)  Obesity: Estimated body mass index is 35.84 kg/m   TODAY's VISIT: Reports concern for generalized continuing pain especially at lower back. Reports pain worsening since 1.5 years ago. She contributes worsening pain may be related to becoming older. Pain usually 9/10 daily. Duloxetine and Tizanidine not helping with pain management. Completed course of Keflex for UTI. Having difficulty sleeping (of which she is  prescribed Trazodone) and experiencing excessive dripping sweat which she is aware is likely related to autonomic dysreflexia. Reports when excessively sweating she sits up to try to improve which causes worsening back pain.   Reports she was surprised of what she found in regards to the hospital doctor's note when she logged in to [facility] to prepare for today's visit. Says she was unaware of the significance of her hospital diagnoses reporting issues were not explained to her as they were in the note. Feels like she is in the dark. Does not feel that she received quality care while at [facility]. Reports she has been hospitalized several times over the past 22 years since becoming quadriplegic.   Reports she has told hospital staff on at least one previous occasion of very bad stomach pain. Reports she just found out today after reviewing her hospital visit that she has fibroids. In the past reports providers thought her stomach pain was actually nerve pain related. Reports she doesn't want to make any hospital staff upset so she chooses to stay as relaxed as possible during potential escalating situations. Reports overall unpleasant experiences during recent and past hospital admissions. Reports she did see a counselor in the past and everything was normal. Reports she prefers not to see a counselor as of present. She is still taking anxiety depression medication and doing well, denies thoughts of self-harm, suicidal ideations, and homicidal ideations.   She is aware of appointment today with Court[facility], [facility]. At that appointment she is hoping to get some insight into the diagnoses of quadriplegia. Reports when diagnosed at age 74 years old she was not aware of the prognosis over the course of time.    Patient/Caregiver self-reported problems/concerns: Yes, discussed above.  MEDICATIONS  Medication Reconciliation conducted with patient/caregiver? Yes  New medications  prescribed/discontinued upon discharge? Yes  Barriers identified related to medications: No   LABS  Lab Reviewed (Yes/No/NA): Yes  PHYSICAL EXAM:  Alert and oriented x 3. Not in acute distress. Physical examination not completed as this is a telemedicine visit.  ASSESSMENT AND PLAN: 1. Encounter to establish care: - Patient presents today to establish care.  - Return for annual physical examination, labs, and health maintenance. Arrive fasting meaning having no food for at least 8 hours prior to appointment. You may have only water or black coffee. Please take scheduled medications as normal.  2. Hospital discharge follow-up: - Reviewed hospital course, current medications, ensured proper follow-up in place, and addressed concerns.   3. Urinary tract infection without hematuria, site unspecified: 4. Severe sepsis [facility]): - Patient has completed course of Keflex. No current issues and/or concerns.  5. Quadriplegia ([place]): 6. History of spinal cord injury: 7. Neurogenic bladder: 8. Chronic indwelling Foley catheter: - Keep appointment with Courtney Heys, MD at [facility] on 02/03/2021.  9. Chronic pain syndrome: - Duloxetine and Tizanidine not helping.  - Referral to Pain Clinic for further evaluation and management.  - Ambulatory referral to Pain Clinic  10. Chronic anemia: - Will repeat CBC at next in-person visit.   11.  Anxiety and depression: - Patient denies thoughts of self-harm, suicidal ideations, and homicidal ideations.  - Patient declined referral to Social Work for counseling services.  - Continue current regimen.  - Follow-up with primary provider as scheduled.  12. Insomnia, unspecified type: - Continue Trazodone as prescribed per [facility] He[facility], MD at [facility].  - Follow-up with primary provider as scheduled.   13. Uterine leiomyoma, unspecified location: - Evident on ultrasound pelvis on 10/14/2020. - Referral to Gynecology  for further evaluation and management.  - Ambulatory referral to Gynecology   PATIENT EDUCATION PROVIDED: See AVS   FOLLOW-UP (Include any further testing or referrals): Follow-up with primary provider as scheduled. Keep appointment with [facility]. Referral to Gynecology.   Patient was given clear instructions to go to Emergency Department or return to medical center if symptoms don't improve, worsen, or new problems develop.The patient verbalized understanding.  I discussed the assessment and treatment plan with the patient. The patient was provided an opportunity to ask questions and all were answered. The patient agreed with the plan and demonstrated an understanding of the instructions.   The patient was advised to call back or seek an in-person evaluation if the symptoms worsen or if the condition fails to improve as anticipated.  I provided 20 minutes total of non-face-to-face time during this encounter.    Camillia Herter, NP  [facility] at [place], [place] 02/01/2021, 1:34 PM

## 2021-02-03 ENCOUNTER — Encounter: Payer: Self-pay | Admitting: Physical Medicine and Rehabilitation

## 2021-02-03 ENCOUNTER — Other Ambulatory Visit: Payer: Self-pay

## 2021-02-03 ENCOUNTER — Encounter
Payer: Medicare Other | Attending: Physical Medicine and Rehabilitation | Admitting: Physical Medicine and Rehabilitation

## 2021-02-03 ENCOUNTER — Telehealth (INDEPENDENT_AMBULATORY_CARE_PROVIDER_SITE_OTHER): Payer: Medicare Other | Admitting: Family

## 2021-02-03 ENCOUNTER — Encounter: Payer: Self-pay | Admitting: Family

## 2021-02-03 VITALS — BP 110/77 | HR 108 | Temp 99.9°F | Ht 65.0 in

## 2021-02-03 DIAGNOSIS — G8250 Quadriplegia, unspecified: Secondary | ICD-10-CM

## 2021-02-03 DIAGNOSIS — Z993 Dependence on wheelchair: Secondary | ICD-10-CM | POA: Diagnosis not present

## 2021-02-03 DIAGNOSIS — R6520 Severe sepsis without septic shock: Secondary | ICD-10-CM | POA: Diagnosis not present

## 2021-02-03 DIAGNOSIS — E669 Obesity, unspecified: Secondary | ICD-10-CM

## 2021-02-03 DIAGNOSIS — N319 Neuromuscular dysfunction of bladder, unspecified: Secondary | ICD-10-CM | POA: Diagnosis not present

## 2021-02-03 DIAGNOSIS — Z7689 Persons encountering health services in other specified circumstances: Secondary | ICD-10-CM

## 2021-02-03 DIAGNOSIS — A419 Sepsis, unspecified organism: Secondary | ICD-10-CM | POA: Diagnosis not present

## 2021-02-03 DIAGNOSIS — G894 Chronic pain syndrome: Secondary | ICD-10-CM

## 2021-02-03 DIAGNOSIS — D259 Leiomyoma of uterus, unspecified: Secondary | ICD-10-CM

## 2021-02-03 DIAGNOSIS — G4700 Insomnia, unspecified: Secondary | ICD-10-CM

## 2021-02-03 DIAGNOSIS — Z978 Presence of other specified devices: Secondary | ICD-10-CM

## 2021-02-03 DIAGNOSIS — Z87828 Personal history of other (healed) physical injury and trauma: Secondary | ICD-10-CM

## 2021-02-03 DIAGNOSIS — R252 Cramp and spasm: Secondary | ICD-10-CM | POA: Diagnosis not present

## 2021-02-03 DIAGNOSIS — N390 Urinary tract infection, site not specified: Secondary | ICD-10-CM

## 2021-02-03 DIAGNOSIS — D649 Anemia, unspecified: Secondary | ICD-10-CM

## 2021-02-03 DIAGNOSIS — F419 Anxiety disorder, unspecified: Secondary | ICD-10-CM | POA: Diagnosis not present

## 2021-02-03 DIAGNOSIS — F32A Depression, unspecified: Secondary | ICD-10-CM | POA: Diagnosis not present

## 2021-02-03 DIAGNOSIS — Z5181 Encounter for therapeutic drug level monitoring: Secondary | ICD-10-CM | POA: Insufficient documentation

## 2021-02-03 DIAGNOSIS — Z09 Encounter for follow-up examination after completed treatment for conditions other than malignant neoplasm: Secondary | ICD-10-CM

## 2021-02-03 MED ORDER — DIAZEPAM 5 MG PO TABS: 10.0000 mg | ORAL_TABLET | Freq: Three times a day (TID) | ORAL | 5 refills | Status: DC

## 2021-02-03 MED ORDER — HYDROCODONE-ACETAMINOPHEN 5-325 MG PO TABS: 1.0000 | ORAL_TABLET | Freq: Four times a day (QID) | ORAL | 0 refills | Status: DC | PRN

## 2021-02-03 MED ORDER — TIZANIDINE HCL 4 MG PO TABS: 4.0000 mg | ORAL_TABLET | Freq: Four times a day (QID) | ORAL | 5 refills | Status: DC
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
## 2021-02-03 NOTE — Patient Instructions (Signed)
Patient is a 40 yr old female with C6 tetraplegia- ASIA A with contractures-  due to [place]- with neurogenic bowel and bladder, spasticity and contractures and frequent UTI/sepsis/. Also has autonomic dysreflexia- intermittent.  Increase Valium to 10 mg 2x/day as 4-5 days, then increase to 10 mg 3x/day- for spasticity.  2.  Increase Zanaflex/Tizanidine to 4 mg 3x/day x 1 week, the increase to 4x/day.   3. Increase Gabapentin back to 600 mg 2x/day   4. Change Tramadol to Norco 5/325 mg q6 hours as needed.   5. Don't make changes all at once- do 1 every 2 days- so we know what medicine is causing the problem.   6. If temp gets to 101, go back to ER- or if AD gets that bad- don't check BP otherwise-  Needs to check at home if possible. If it's 180/190 higher number and won't come down, need to go to ER-.  Tell them you have autonomic dysreflexia.   7. Let me know PCP so I can call and discuss pt's care with them.   8. F/ u 3 months- SCI double appointment.

## 2021-02-03 NOTE — Progress Notes (Deleted)
   Subjective:    Patient ID: Tracey Hall, female    DOB: 1981-01-19, 40 y.o.   MRN: [id number]  HPI   .cpr Review of Systems     Objective:   Physical Exam        Assessment & Plan:

## 2021-02-04 ENCOUNTER — Telehealth: Payer: Self-pay | Admitting: *Deleted

## 2021-02-04 NOTE — Telephone Encounter (Addendum)
Diazepam order is causing insurance to require a PA and is questioning why a higher dose table wasn't chosen to cut down on # of pills dispensed.  As ordered :Take 2 tablets (10 mg total) by mouth 3 (three) times daily. TAKE 1 TABLET(5 MG) BY MOUTH TWICE DAILY for spasms (for a few days do 10 mg BID THEN increase to TID) disp #180   PRIOR AUTH initiated with [employer] via CoverMyMeds.  Approval given 02/04/21 until further notice.

## 2021-02-10 ENCOUNTER — Other Ambulatory Visit: Payer: Self-pay | Admitting: Physical Medicine and Rehabilitation

## 2021-02-17 ENCOUNTER — Telehealth: Payer: Self-pay

## 2021-02-17 NOTE — Telephone Encounter (Signed)
Refill request for Phentermine 15 mg #30.

## 2021-02-20 NOTE — Telephone Encounter (Signed)
Called and left patient a message to call back.

## 2021-03-13 ENCOUNTER — Other Ambulatory Visit: Payer: Self-pay

## 2021-03-14 MED ORDER — HYDROCODONE-ACETAMINOPHEN 5-325 MG PO TABS: 1.0000 | ORAL_TABLET | Freq: Four times a day (QID) | ORAL | 0 refills | Status: DC | PRN

## 2021-03-14 NOTE — Progress Notes (Signed)
This encounter was created in error - please disregard.

## 2021-03-20 ENCOUNTER — Other Ambulatory Visit: Payer: Self-pay

## 2021-04-17 ENCOUNTER — Other Ambulatory Visit: Payer: Self-pay

## 2021-04-18 ENCOUNTER — Encounter: Payer: Self-pay | Admitting: *Deleted

## 2021-04-18 MED ORDER — HYDROCODONE-ACETAMINOPHEN 5-325 MG PO TABS: 1.0000 | ORAL_TABLET | Freq: Four times a day (QID) | ORAL | 0 refills | Status: DC | PRN

## 2021-05-13 ENCOUNTER — Other Ambulatory Visit: Payer: Self-pay | Admitting: Physical Medicine and Rehabilitation

## 2021-05-13 MED ORDER — HYDROCODONE-ACETAMINOPHEN 5-325 MG PO TABS: 1.0000 | ORAL_TABLET | Freq: Four times a day (QID) | ORAL | 0 refills | Status: DC | PRN

## 2021-05-13 NOTE — Telephone Encounter (Signed)
  PMP report: Filled  Drug  QTY  Days  Prescriber   03/14/2021  Hydrocodone-Acetamin 5-325 Mg 30.00 7 Za Swa  02/04/2021  Diazepam 5 Mg Tablet 180.00 30 Me Lov  02/03/2021  Hydrocodone-Acetamin 5-325 Mg 30.00 7 Me Lov

## 2021-05-18 ENCOUNTER — Emergency Department (HOSPITAL_COMMUNITY)
Admission: EM | Admit: 2021-05-18 | Discharge: 2021-05-19 | Disposition: A | Discharge status: Home / Self Care | Payer: Medicare Other | Attending: Emergency Medicine | Admitting: Emergency Medicine

## 2021-05-18 ENCOUNTER — Encounter (HOSPITAL_COMMUNITY): Payer: Self-pay

## 2021-05-18 ENCOUNTER — Other Ambulatory Visit: Payer: Self-pay

## 2021-05-18 DIAGNOSIS — J45909 Unspecified asthma, uncomplicated: Secondary | ICD-10-CM | POA: Insufficient documentation

## 2021-05-18 DIAGNOSIS — Z20822 Contact with and (suspected) exposure to covid-19: Secondary | ICD-10-CM | POA: Diagnosis not present

## 2021-05-18 DIAGNOSIS — N12 Tubulo-interstitial nephritis, not specified as acute or chronic: Secondary | ICD-10-CM | POA: Diagnosis not present

## 2021-05-18 DIAGNOSIS — Z91040 Latex allergy status: Secondary | ICD-10-CM | POA: Insufficient documentation

## 2021-05-18 DIAGNOSIS — R519 Headache, unspecified: Secondary | ICD-10-CM | POA: Diagnosis not present

## 2021-05-18 DIAGNOSIS — M545 Low back pain, unspecified: Secondary | ICD-10-CM | POA: Insufficient documentation

## 2021-05-18 DIAGNOSIS — R1084 Generalized abdominal pain: Secondary | ICD-10-CM | POA: Diagnosis present

## 2021-05-18 DIAGNOSIS — D72829 Elevated white blood cell count, unspecified: Secondary | ICD-10-CM | POA: Diagnosis not present

## 2021-05-18 LAB — CBC WITH DIFFERENTIAL/PLATELET
Abs Immature Granulocytes: 0.03 10*3/uL (ref 0.00–0.07)
Basophils Absolute: 0 10*3/uL (ref 0.0–0.1)
Basophils Relative: 0 %
Eosinophils Absolute: 0.2 10*3/uL (ref 0.0–0.5)
Eosinophils Relative: 2 %
HCT: 25.1 % — ABNORMAL LOW (ref 36.0–46.0)
Hemoglobin: 7.1 g/dL — ABNORMAL LOW (ref 12.0–15.0)
Immature Granulocytes: 0 %
Lymphocytes Relative: 29 %
Lymphs Abs: 2.6 10*3/uL (ref 0.7–4.0)
MCH: 19 pg — ABNORMAL LOW (ref 26.0–34.0)
MCHC: 28.3 g/dL — ABNORMAL LOW (ref 30.0–36.0)
MCV: 67.1 fL — ABNORMAL LOW (ref 80.0–100.0)
Monocytes Absolute: 0.6 10*3/uL (ref 0.1–1.0)
Monocytes Relative: 6 %
Neutro Abs: 5.7 10*3/uL (ref 1.7–7.7)
Neutrophils Relative %: 63 %
Platelets: 602 10*3/uL — ABNORMAL HIGH (ref 150–400)
RBC: 3.74 MIL/uL — ABNORMAL LOW (ref 3.87–5.11)
RDW: 19.2 % — ABNORMAL HIGH (ref 11.5–15.5)
WBC: 9.1 10*3/uL (ref 4.0–10.5)
nRBC: 0 % (ref 0.0–0.2)

## 2021-05-18 LAB — COMPREHENSIVE METABOLIC PANEL
ALT: 22 U/L (ref 0–44)
AST: 45 U/L — ABNORMAL HIGH (ref 15–41)
Albumin: 3.5 g/dL (ref 3.5–5.0)
Alkaline Phosphatase: 81 U/L (ref 38–126)
Anion gap: 6 (ref 5–15)
BUN: 10 mg/dL (ref 6–20)
CO2: 26 mmol/L (ref 22–32)
Calcium: 8.7 mg/dL — ABNORMAL LOW (ref 8.9–10.3)
Chloride: 102 mmol/L (ref 98–111)
Creatinine, Ser: 0.46 mg/dL (ref 0.44–1.00)
GFR, Estimated: >60 mL/min (ref >60)
Glucose, Bld: 100 mg/dL — ABNORMAL HIGH (ref 70–99)
Potassium: 3.7 mmol/L (ref 3.5–5.1)
Sodium: 134 mmol/L — ABNORMAL LOW (ref 135–145)
Total Bilirubin: 0.3 mg/dL (ref 0.3–1.2)
Total Protein: 7.1 g/dL (ref 6.5–8.1)

## 2021-05-18 MED ORDER — ONDANSETRON 4 MG PO TBDP
4.0000 mg | ORAL_TABLET | Freq: Once | ORAL | Status: AC
  Administered 2021-05-18: 4 mg via ORAL
  Filled 2021-05-18: qty 1

## 2021-05-18 MED ORDER — HYDROCODONE-ACETAMINOPHEN 5-325 MG PO TABS
1.0000 | ORAL_TABLET | Freq: Once | ORAL | Status: AC
  Administered 2021-05-18: 1 via ORAL
  Filled 2021-05-18: qty 1

## 2021-05-18 NOTE — ED Triage Notes (Signed)
Pt states that she feels like she is getting a UTI. Pt states that she has been feeling unwell x 3 weeks. Pt states that she has generalized body aches.

## 2021-05-18 NOTE — ED Notes (Signed)
Blue, dark green, and gold top save tubes sent to lab.
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
## 2021-05-19 ENCOUNTER — Other Ambulatory Visit: Payer: Self-pay | Admitting: Physical Medicine and Rehabilitation

## 2021-05-19 DIAGNOSIS — N12 Tubulo-interstitial nephritis, not specified as acute or chronic: Secondary | ICD-10-CM | POA: Diagnosis not present

## 2021-05-19 LAB — URINALYSIS, ROUTINE W REFLEX MICROSCOPIC
Bilirubin Urine: NEGATIVE
Glucose, UA: NEGATIVE mg/dL
Hgb urine dipstick: NEGATIVE
Ketones, ur: 15 mg/dL — AB
Nitrite: POSITIVE — AB
Specific Gravity, Urine: 1.02 (ref 1.005–1.030)
pH: 8 (ref 5.0–8.0)

## 2021-05-19 LAB — URINALYSIS, MICROSCOPIC (REFLEX)
RBC / HPF: NONE SEEN RBC/hpf (ref 0–5)
WBC, UA: >50 WBC/hpf (ref 0–5)

## 2021-05-19 LAB — RESP PANEL BY RT-PCR (FLU A&B, COVID) ARPGX2
Influenza A by PCR: NEGATIVE
Influenza B by PCR: NEGATIVE
SARS Coronavirus 2 by RT PCR: NEGATIVE

## 2021-05-19 MED ORDER — SODIUM CHLORIDE 0.9 % IV SOLN
2.0000 g | Freq: Once | INTRAVENOUS | Status: AC
  Administered 2021-05-19: 04:00:00 2 g via INTRAVENOUS
  Filled 2021-05-19: qty 20

## 2021-05-19 MED ORDER — CEPHALEXIN 500 MG PO CAPS: 500.0000 mg | ORAL_CAPSULE | Freq: Four times a day (QID) | ORAL | 0 refills | Status: DC

## 2021-05-19 NOTE — Discharge Instructions (Addendum)
Return to ED with any new or worsening symptoms such as blood in urine, vomiting, fever Take prescription antibiotics to completion Follow up with PCP in 7-10 days to ensure that infection is resolving We will culture your urine here and ensure that we are covering the right bacteria with your outpatient antibiotic regimen.

## 2021-05-19 NOTE — ED Notes (Signed)
Pt. Made aware for the need of urine specimen. 

## 2021-05-20 LAB — URINE CULTURE: Culture: >=100000 — AB

## 2021-05-21 ENCOUNTER — Encounter: Payer: Self-pay | Admitting: Physical Medicine and Rehabilitation

## 2021-05-21 ENCOUNTER — Telehealth: Payer: Self-pay | Admitting: Emergency Medicine

## 2021-05-21 NOTE — Telephone Encounter (Signed)
Post ED Visit - Positive Culture Follow-up  Culture report reviewed by antimicrobial stewardship pharmacist: [facility] Team []  Elenor Quinones, Pharm.D. [x]  Heide Guile, Pharm.D., BCPS AQ-ID []  Parks Neptune, Pharm.D., BCPS []  Alycia Rossetti, Pharm.D., BCPS []  [place], Pharm.D., BCPS, AAHIVP []  Legrand Como, Pharm.D., BCPS, AAHIVP []  Salome Arnt, PharmD, BCPS []  Johnnette Gourd, PharmD, BCPS []  Hughes Better, PharmD, BCPS []  Leeroy Cha, PharmD []  Laqueta Linden, PharmD, BCPS []  Albertina Parr, PharmD  [place] Team []  Leodis Sias, PharmD []  Lindell Spar, PharmD []  Royetta Asal, PharmD []  Graylin Shiver, Rph []  Rema Fendt) Glennon Mac, PharmD []  Arlyn Dunning, PharmD []  Netta Cedars, PharmD []  Dia Sitter, PharmD []  Leone Haven, PharmD []  Gretta Arab, PharmD []  Theodis Shove, PharmD []  Peggyann Juba, PharmD []  Reuel Boom, PharmD   Positive urine culture Treated with cephalexin, organism sensitive to the same and no further patient follow-up is required at this time.  Hazle Nordmann 05/21/2021, 9:52 AM

## 2021-05-22 NOTE — Telephone Encounter (Signed)
Called [facility]- pt on correct PO ABX for Klebsiella pneumo- Keflex 500 mg 4x/day. Pt still having low abd pain that radiates to her flank.  No blood she can see in urine.  U/A (-) for blood as well.  However since still feeling Sx's of UTI- I think the next step, if things don't improve by this weekend, is to look for bladder/kidney stones or stool ball.   D/w pt.

## 2021-06-27 ENCOUNTER — Encounter: Payer: Self-pay | Admitting: Physical Medicine and Rehabilitation

## 2021-07-01 ENCOUNTER — Ambulatory Visit (INDEPENDENT_AMBULATORY_CARE_PROVIDER_SITE_OTHER): Payer: Medicare Other | Admitting: Family

## 2021-07-01 ENCOUNTER — Other Ambulatory Visit: Payer: Self-pay

## 2021-07-01 ENCOUNTER — Telehealth: Payer: Self-pay | Admitting: Family

## 2021-07-01 DIAGNOSIS — Z87828 Personal history of other (healed) physical injury and trauma: Secondary | ICD-10-CM | POA: Diagnosis not present

## 2021-07-01 DIAGNOSIS — N390 Urinary tract infection, site not specified: Secondary | ICD-10-CM

## 2021-07-01 DIAGNOSIS — Z022 Encounter for examination for admission to residential institution: Secondary | ICD-10-CM

## 2021-07-01 DIAGNOSIS — G8250 Quadriplegia, unspecified: Secondary | ICD-10-CM

## 2021-07-01 DIAGNOSIS — N319 Neuromuscular dysfunction of bladder, unspecified: Secondary | ICD-10-CM | POA: Diagnosis not present

## 2021-07-01 DIAGNOSIS — Z978 Presence of other specified devices: Secondary | ICD-10-CM | POA: Diagnosis not present

## 2021-07-01 DIAGNOSIS — L309 Dermatitis, unspecified: Secondary | ICD-10-CM | POA: Diagnosis not present

## 2021-07-01 NOTE — Progress Notes (Signed)
Pt presents for telemedicine for assisted living facility, needs FL2 form completed, considering going into home
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
## 2021-07-02 NOTE — Telephone Encounter (Signed)
Call placed to the patient this evening to discuss her desire to be placed in ALF.  She explained how she is trying to plan for a lifetime of care for herself without being dependent on family members for providing " years" of care for her.  She has contacted DSS and was given some information about ALFs.  She said she has started calling some places but was not exactly sure what she needs or would qualify for.   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  She is also aware that she may need to have assistance with a bowel program.  She prefers the idea of an ALF instead of a SNF and she would like to remain as independent as possible. She does not want to be confined to a facility  She said she has called some facilities on the list provided by DSS;  but they are not able to meet her needs: [facility] does not have support for patients with catheters, [facility] is too expensive/ private pay and she has not heard back from [employer]. She has access to a computer. This CM explained to her that she can check website for [employer] [facility] and search for [employer] of ALFs and  contact facilities that accept [employer]  She is aware that she would need to relinquish most of her monthly check to the facility to help support the cost of her care and most likely she will not have a private room.  She is not able to afford a private pay facility.   She plans to speak to the facilities and then tour the ones she is interested in.  She understands that she needs to inquire about female bed availability. This CM explained that after she has identified a facility/ facilities that she would like to submit applications to, she can contact this CM and an FL2 can be sent that/those facilities.   She then  inquired about services at home that would be able to support her needs. She said when she called DSS she only asked about facilities, not home care. She said she applied for CAP services at one point, but something happened to the application and she didn't hear from the program.  This CM explained that CAP aides would not be able to replace catheters.  This CM also explained that even if she had a home health nurse involved. They would not be able to come out as soon as she would call for help with her catheter and that could mean waiting until the next day for a nurse to arrive. If she wants 24/7 support for her catheter in the event she has a problem with it, then a facility would be her best option. She is interested in obtaining more information about receiving care at home. Explained to her that this CM will call [facility] and inquire about nursing services offered for disabled who need more than an aide  Again, this CM explained that even if she were to be given nursing services there is no guarantee that a nurse would be able to come out to see her to replace a catheter.   She is anxious to have more information about her options for care and will continue to call ALFs while this CM checks  on in home services that are offered for an individual her age.

## 2021-07-03 NOTE — Telephone Encounter (Signed)
Call placed to patient and informed her that this CM left message at [facility] this morning; but no return call yet.  Will keep patient updated when information is received.

## 2021-07-03 NOTE — Telephone Encounter (Signed)
Call placed to [facility] and Adult Services # [phone], message left for Myrene Buddy requesting a call back to this CM to inquire about in home services for patient.

## 2021-07-24 ENCOUNTER — Telehealth: Payer: Self-pay

## 2021-07-24 NOTE — Telephone Encounter (Signed)
This CM was able to speak with Celene Kras, SW - [facility] of [facility] and Adult Services # [phone] who explained that she would be happy to speak with the patient about possible options for in- home assistance.  The special assistance in-home program provides financial assistance for those who qualify to to help them remain at home in lieu of placement in a facility.  This CM did not share patient's name , DOB, address or contact information with Ms [facility].   Attempted to contact patient # [phone], voicemail not set up. Voice message was left on # [phone] with call back requested to this CM.  Want to provide her with the phone number for [facility].

## 2021-07-31 NOTE — Telephone Encounter (Signed)
Calls placed to patient instruct her to call Celene Kras, SW - [facility] of Aging and Adult Services # [phone] about possible options for in- home assistance.    Called # [phone], voicemail not set up.  Voice message was left on # [phone] with call back requested to this CM.

## 2021-08-21 ENCOUNTER — Other Ambulatory Visit: Payer: Self-pay | Admitting: Physical Medicine and Rehabilitation

## 2021-09-15 ENCOUNTER — Encounter: Payer: Self-pay | Admitting: Physical Medicine and Rehabilitation

## 2021-09-17 ENCOUNTER — Other Ambulatory Visit: Payer: Self-pay

## 2021-09-17 ENCOUNTER — Emergency Department (HOSPITAL_COMMUNITY): Payer: Medicare Other

## 2021-09-17 ENCOUNTER — Encounter (HOSPITAL_BASED_OUTPATIENT_CLINIC_OR_DEPARTMENT_OTHER): Payer: Medicare Other | Admitting: Physical Medicine and Rehabilitation

## 2021-09-17 ENCOUNTER — Inpatient Hospital Stay (HOSPITAL_COMMUNITY)
Admission: EM | Admit: 2021-09-17 | Discharge: 2021-09-23 | DRG: 689 | Disposition: A | Discharge status: Home / Self Care | Payer: Medicare Other | Attending: Family Medicine | Admitting: Family Medicine

## 2021-09-17 ENCOUNTER — Encounter: Payer: Self-pay | Admitting: Physical Medicine and Rehabilitation

## 2021-09-17 ENCOUNTER — Encounter (HOSPITAL_COMMUNITY): Payer: Self-pay

## 2021-09-17 VITALS — BP 86/58 | HR 71 | Ht 65.0 in

## 2021-09-17 DIAGNOSIS — B9620 Unspecified Escherichia coli [E. coli] as the cause of diseases classified elsewhere: Secondary | ICD-10-CM | POA: Diagnosis present

## 2021-09-17 DIAGNOSIS — Z993 Dependence on wheelchair: Secondary | ICD-10-CM | POA: Diagnosis not present

## 2021-09-17 DIAGNOSIS — G8250 Quadriplegia, unspecified: Secondary | ICD-10-CM | POA: Diagnosis not present

## 2021-09-17 DIAGNOSIS — N3289 Other specified disorders of bladder: Secondary | ICD-10-CM | POA: Diagnosis present

## 2021-09-17 DIAGNOSIS — Z79899 Other long term (current) drug therapy: Secondary | ICD-10-CM

## 2021-09-17 DIAGNOSIS — Z87828 Personal history of other (healed) physical injury and trauma: Secondary | ICD-10-CM

## 2021-09-17 DIAGNOSIS — G904 Autonomic dysreflexia: Secondary | ICD-10-CM | POA: Diagnosis present

## 2021-09-17 DIAGNOSIS — I959 Hypotension, unspecified: Secondary | ICD-10-CM

## 2021-09-17 DIAGNOSIS — G894 Chronic pain syndrome: Secondary | ICD-10-CM | POA: Diagnosis present

## 2021-09-17 DIAGNOSIS — N12 Tubulo-interstitial nephritis, not specified as acute or chronic: Secondary | ICD-10-CM | POA: Diagnosis not present

## 2021-09-17 DIAGNOSIS — I951 Orthostatic hypotension: Secondary | ICD-10-CM | POA: Diagnosis present

## 2021-09-17 DIAGNOSIS — M792 Neuralgia and neuritis, unspecified: Secondary | ICD-10-CM | POA: Diagnosis not present

## 2021-09-17 DIAGNOSIS — N319 Neuromuscular dysfunction of bladder, unspecified: Secondary | ICD-10-CM | POA: Diagnosis not present

## 2021-09-17 DIAGNOSIS — R1030 Lower abdominal pain, unspecified: Secondary | ICD-10-CM | POA: Diagnosis not present

## 2021-09-17 DIAGNOSIS — D649 Anemia, unspecified: Secondary | ICD-10-CM

## 2021-09-17 DIAGNOSIS — D509 Iron deficiency anemia, unspecified: Secondary | ICD-10-CM | POA: Diagnosis present

## 2021-09-17 DIAGNOSIS — R5383 Other fatigue: Secondary | ICD-10-CM | POA: Diagnosis not present

## 2021-09-17 DIAGNOSIS — K5903 Drug induced constipation: Secondary | ICD-10-CM | POA: Diagnosis present

## 2021-09-17 DIAGNOSIS — R531 Weakness: Secondary | ICD-10-CM | POA: Diagnosis not present

## 2021-09-17 DIAGNOSIS — R519 Headache, unspecified: Secondary | ICD-10-CM | POA: Diagnosis not present

## 2021-09-17 DIAGNOSIS — R252 Cramp and spasm: Secondary | ICD-10-CM | POA: Diagnosis not present

## 2021-09-17 DIAGNOSIS — T402X5A Adverse effect of other opioids, initial encounter: Secondary | ICD-10-CM | POA: Diagnosis present

## 2021-09-17 DIAGNOSIS — L89220 Pressure ulcer of left hip, unstageable: Secondary | ICD-10-CM | POA: Diagnosis present

## 2021-09-17 DIAGNOSIS — F329 Major depressive disorder, single episode, unspecified: Secondary | ICD-10-CM | POA: Diagnosis present

## 2021-09-17 DIAGNOSIS — Z87820 Personal history of traumatic brain injury: Secondary | ICD-10-CM

## 2021-09-17 DIAGNOSIS — R5381 Other malaise: Secondary | ICD-10-CM | POA: Diagnosis not present

## 2021-09-17 DIAGNOSIS — L89209 Pressure ulcer of unspecified hip, unspecified stage: Secondary | ICD-10-CM

## 2021-09-17 DIAGNOSIS — F411 Generalized anxiety disorder: Secondary | ICD-10-CM | POA: Diagnosis present

## 2021-09-17 DIAGNOSIS — G901 Familial dysautonomia [Riley-Day]: Secondary | ICD-10-CM | POA: Diagnosis present

## 2021-09-17 DIAGNOSIS — J45909 Unspecified asthma, uncomplicated: Secondary | ICD-10-CM | POA: Diagnosis present

## 2021-09-17 DIAGNOSIS — R109 Unspecified abdominal pain: Secondary | ICD-10-CM | POA: Diagnosis not present

## 2021-09-17 DIAGNOSIS — K592 Neurogenic bowel, not elsewhere classified: Secondary | ICD-10-CM | POA: Diagnosis present

## 2021-09-17 DIAGNOSIS — Z20822 Contact with and (suspected) exposure to covid-19: Secondary | ICD-10-CM | POA: Diagnosis present

## 2021-09-17 DIAGNOSIS — R4182 Altered mental status, unspecified: Secondary | ICD-10-CM | POA: Diagnosis not present

## 2021-09-17 DIAGNOSIS — Z823 Family history of stroke: Secondary | ICD-10-CM

## 2021-09-17 DIAGNOSIS — N3000 Acute cystitis without hematuria: Principal | ICD-10-CM | POA: Diagnosis present

## 2021-09-17 DIAGNOSIS — L8990 Pressure ulcer of unspecified site, unspecified stage: Secondary | ICD-10-CM | POA: Insufficient documentation

## 2021-09-17 LAB — URINALYSIS, ROUTINE W REFLEX MICROSCOPIC
Bilirubin Urine: NEGATIVE
Glucose, UA: NEGATIVE mg/dL
Ketones, ur: NEGATIVE mg/dL
Nitrite: POSITIVE — AB
Protein, ur: NEGATIVE mg/dL
Specific Gravity, Urine: 1.01 (ref 1.005–1.030)
pH: 6 (ref 5.0–8.0)

## 2021-09-17 LAB — COMPREHENSIVE METABOLIC PANEL
ALT: 12 U/L (ref 0–44)
AST: 13 U/L — ABNORMAL LOW (ref 15–41)
Albumin: 3.1 g/dL — ABNORMAL LOW (ref 3.5–5.0)
Alkaline Phosphatase: 79 U/L (ref 38–126)
Anion gap: 7 (ref 5–15)
BUN: 6 mg/dL (ref 6–20)
CO2: 25 mmol/L (ref 22–32)
Calcium: 8.8 mg/dL — ABNORMAL LOW (ref 8.9–10.3)
Chloride: 107 mmol/L (ref 98–111)
Creatinine, Ser: 0.41 mg/dL — ABNORMAL LOW (ref 0.44–1.00)
GFR, Estimated: >60 mL/min (ref >60)
Glucose, Bld: 96 mg/dL (ref 70–99)
Potassium: 3.5 mmol/L (ref 3.5–5.1)
Sodium: 139 mmol/L (ref 135–145)
Total Bilirubin: 0.5 mg/dL (ref 0.3–1.2)
Total Protein: 6.8 g/dL (ref 6.5–8.1)

## 2021-09-17 LAB — LACTIC ACID, PLASMA: Lactic Acid, Venous: 0.8 mmol/L (ref 0.5–1.9)

## 2021-09-17 LAB — I-STAT CHEM 8, ED
BUN: 5 mg/dL — ABNORMAL LOW (ref 6–20)
Calcium, Ion: 1.13 mmol/L — ABNORMAL LOW (ref 1.15–1.40)
Chloride: 106 mmol/L (ref 98–111)
Creatinine, Ser: 0.3 mg/dL — ABNORMAL LOW (ref 0.44–1.00)
Glucose, Bld: 97 mg/dL (ref 70–99)
HCT: 23 % — ABNORMAL LOW (ref 36.0–46.0)
Hemoglobin: 7.8 g/dL — ABNORMAL LOW (ref 12.0–15.0)
Potassium: 3.6 mmol/L (ref 3.5–5.1)
Sodium: 140 mmol/L (ref 135–145)
TCO2: 27 mmol/L (ref 22–32)

## 2021-09-17 LAB — BRAIN NATRIURETIC PEPTIDE: B Natriuretic Peptide: 62.6 pg/mL (ref 0.0–100.0)

## 2021-09-17 LAB — CBC WITH DIFFERENTIAL/PLATELET
Abs Immature Granulocytes: 0.02 10*3/uL (ref 0.00–0.07)
Basophils Absolute: 0 10*3/uL (ref 0.0–0.1)
Basophils Relative: 0 %
Eosinophils Absolute: 0.2 10*3/uL (ref 0.0–0.5)
Eosinophils Relative: 3 %
HCT: 21.7 % — ABNORMAL LOW (ref 36.0–46.0)
Hemoglobin: 5.8 g/dL — CL (ref 12.0–15.0)
Immature Granulocytes: 0 %
Lymphocytes Relative: 35 %
Lymphs Abs: 2.2 10*3/uL (ref 0.7–4.0)
MCH: 17.1 pg — ABNORMAL LOW (ref 26.0–34.0)
MCHC: 26.7 g/dL — ABNORMAL LOW (ref 30.0–36.0)
MCV: 64 fL — ABNORMAL LOW (ref 80.0–100.0)
Monocytes Absolute: 0.5 10*3/uL (ref 0.1–1.0)
Monocytes Relative: 7 %
Neutro Abs: 3.6 10*3/uL (ref 1.7–7.7)
Neutrophils Relative %: 55 %
Platelets: 539 10*3/uL — ABNORMAL HIGH (ref 150–400)
RBC: 3.39 MIL/uL — ABNORMAL LOW (ref 3.87–5.11)
RDW: 20.5 % — ABNORMAL HIGH (ref 11.5–15.5)
WBC: 6.5 10*3/uL (ref 4.0–10.5)
nRBC: 0 % (ref 0.0–0.2)

## 2021-09-17 LAB — URINALYSIS, MICROSCOPIC (REFLEX): WBC, UA: >50 WBC/hpf (ref 0–5)

## 2021-09-17 LAB — PREPARE RBC (CROSSMATCH)

## 2021-09-17 LAB — FERRITIN: Ferritin: 9 ng/mL — ABNORMAL LOW (ref 11–307)

## 2021-09-17 LAB — RESP PANEL BY RT-PCR (FLU A&B, COVID) ARPGX2
Influenza A by PCR: NEGATIVE
Influenza B by PCR: NEGATIVE
SARS Coronavirus 2 by RT PCR: NEGATIVE

## 2021-09-17 LAB — LIPASE, BLOOD: Lipase: 24 U/L (ref 11–51)

## 2021-09-17 LAB — I-STAT BETA HCG BLOOD, ED (MC, WL, AP ONLY): I-stat hCG, quantitative: <5 m[IU]/mL (ref <5)

## 2021-09-17 LAB — IRON AND TIBC
Iron: 17 ug/dL — ABNORMAL LOW (ref 28–170)
Saturation Ratios: 4 % — ABNORMAL LOW (ref 10.4–31.8)
TIBC: 427 ug/dL (ref 250–450)
UIBC: 410 ug/dL

## 2021-09-17 LAB — HEMOGLOBIN AND HEMATOCRIT, BLOOD
HCT: 23.1 % — ABNORMAL LOW (ref 36.0–46.0)
Hemoglobin: 6.4 g/dL — CL (ref 12.0–15.0)

## 2021-09-17 LAB — LACTATE DEHYDROGENASE: LDH: 110 U/L (ref 98–192)

## 2021-09-17 LAB — TROPONIN I (HIGH SENSITIVITY)
Troponin I (High Sensitivity): <2 ng/L (ref <18)
Troponin I (High Sensitivity): <2 ng/L (ref <18)

## 2021-09-17 MED ORDER — SODIUM CHLORIDE 0.9 % IV SOLN
10.0000 mL/h | Freq: Once | INTRAVENOUS | Status: AC
Start: 2021-09-17 — End: 2021-09-17
  Administered 2021-09-17: 10 mL/h via INTRAVENOUS

## 2021-09-17 MED ORDER — SODIUM CHLORIDE 0.9 % IV SOLN
10.0000 mL/h | Freq: Once | INTRAVENOUS | Status: AC
  Administered 2021-09-17: 10 mL/h via INTRAVENOUS

## 2021-09-17 MED ORDER — IOHEXOL 300 MG/ML  SOLN
100.0000 mL | Freq: Once | INTRAMUSCULAR | Status: AC | PRN
  Administered 2021-09-17: 100 mL via INTRAVENOUS

## 2021-09-17 MED ORDER — PANTOPRAZOLE SODIUM 40 MG IV SOLR
40.0000 mg | Freq: Once | INTRAVENOUS | Status: AC
Start: 2021-09-17 — End: 2021-09-17
  Administered 2021-09-17: 40 mg via INTRAVENOUS
  Filled 2021-09-17: qty 10

## 2021-09-17 MED ORDER — OXYBUTYNIN CHLORIDE 5 MG PO TABS: 5.0000 mg | ORAL_TABLET | Freq: Three times a day (TID) | ORAL | 5 refills | Status: DC

## 2021-09-17 MED ORDER — DIAZEPAM 10 MG PO TABS: 10.0000 mg | ORAL_TABLET | Freq: Three times a day (TID) | ORAL | 5 refills | Status: DC

## 2021-09-17 MED ORDER — CITALOPRAM HYDROBROMIDE 20 MG PO TABS: 20.0000 mg | ORAL_TABLET | Freq: Every day | ORAL | 5 refills | Status: DC

## 2021-09-17 MED ORDER — DULOXETINE HCL 60 MG PO CPEP
60.0000 mg | ORAL_CAPSULE | Freq: Every day | ORAL | Status: DC
Start: 2021-09-18 — End: 2021-09-18

## 2021-09-17 MED ORDER — GABAPENTIN 300 MG PO CAPS
600.0000 mg | ORAL_CAPSULE | Freq: Two times a day (BID) | ORAL | Status: DC
Start: 2021-09-17 — End: 2021-09-23
  Administered 2021-09-18 – 2021-09-23 (×12): 600 mg via ORAL
  Filled 2021-09-17 (×12): qty 2

## 2021-09-17 MED ORDER — HYDROCODONE-ACETAMINOPHEN 5-325 MG PO TABS: 1.0000 | ORAL_TABLET | Freq: Four times a day (QID) | ORAL | 0 refills | Status: DC | PRN

## 2021-09-17 MED ORDER — SODIUM CHLORIDE 0.9 % IV BOLUS
1000.0000 mL | Freq: Once | INTRAVENOUS | Status: AC
  Administered 2021-09-17: 1000 mL via INTRAVENOUS

## 2021-09-17 MED ORDER — DIAZEPAM 5 MG PO TABS
10.0000 mg | ORAL_TABLET | Freq: Three times a day (TID) | ORAL | Status: DC | PRN
  Administered 2021-09-18: 10 mg via ORAL
  Filled 2021-09-17: qty 2

## 2021-09-17 MED ORDER — MORPHINE SULFATE (PF) 4 MG/ML IV SOLN
4.0000 mg | Freq: Once | INTRAVENOUS | Status: AC
  Administered 2021-09-17: 4 mg via INTRAVENOUS
  Filled 2021-09-17: qty 1

## 2021-09-17 MED ORDER — ACETAMINOPHEN 325 MG PO TABS
650.0000 mg | ORAL_TABLET | Freq: Four times a day (QID) | ORAL | Status: DC
  Administered 2021-09-18 – 2021-09-23 (×17): 650 mg via ORAL
  Filled 2021-09-17 (×18): qty 2

## 2021-09-17 MED ORDER — SODIUM CHLORIDE 0.9 % IV SOLN
1.0000 g | Freq: Once | INTRAVENOUS | Status: AC
Start: 2021-09-17 — End: 2021-09-17
  Administered 2021-09-17: 1 g via INTRAVENOUS
  Filled 2021-09-17: qty 10

## 2021-09-17 MED ORDER — TIZANIDINE HCL 4 MG PO TABS
4.0000 mg | ORAL_TABLET | Freq: Four times a day (QID) | ORAL | Status: DC
  Administered 2021-09-18 – 2021-09-23 (×20): 4 mg via ORAL
  Filled 2021-09-17 (×20): qty 1

## 2021-09-17 MED ORDER — TIZANIDINE HCL 4 MG PO TABS: 4.0000 mg | ORAL_TABLET | Freq: Four times a day (QID) | ORAL | 1 refills | Status: DC

## 2021-09-17 MED ORDER — OXYCODONE HCL 5 MG PO TABS
5.0000 mg | ORAL_TABLET | Freq: Four times a day (QID) | ORAL | Status: DC | PRN
  Administered 2021-09-18 – 2021-09-22 (×15): 5 mg via ORAL
  Filled 2021-09-17 (×16): qty 1

## 2021-09-17 MED ORDER — GABAPENTIN 300 MG PO CAPS: 600.0000 mg | ORAL_CAPSULE | Freq: Two times a day (BID) | ORAL | 1 refills | Status: DC

## 2021-09-17 MED ORDER — ONDANSETRON HCL 4 MG/2ML IJ SOLN
4.0000 mg | Freq: Once | INTRAMUSCULAR | Status: AC
  Administered 2021-09-17: 4 mg via INTRAVENOUS
  Filled 2021-09-17: qty 2

## 2021-09-17 MED ORDER — ONDANSETRON HCL 4 MG/2ML IJ SOLN
4.0000 mg | Freq: Once | INTRAMUSCULAR | Status: AC
Start: 2021-09-17 — End: 2021-09-17
  Administered 2021-09-17: 4 mg via INTRAVENOUS
  Filled 2021-09-17: qty 2

## 2021-09-17 MED ORDER — OXYBUTYNIN CHLORIDE 5 MG PO TABS
5.0000 mg | ORAL_TABLET | Freq: Three times a day (TID) | ORAL | Status: DC
  Administered 2021-09-18 (×2): 5 mg via ORAL
  Filled 2021-09-17 (×2): qty 1

## 2021-09-17 MED ORDER — ENOXAPARIN SODIUM 40 MG/0.4ML IJ SOSY
40.0000 mg | PREFILLED_SYRINGE | INTRAMUSCULAR | Status: DC
  Filled 2021-09-17: qty 0.4

## 2021-09-17 NOTE — ED Triage Notes (Signed)
Pt arrived POV from the drs office c/o of generalized weakness and not feeling good. Pt states she went to the doctor and they sent her here to rule out a UTI and to replace a her foley. Pt states she had a foley but was so out of it last night she pulled it out. Pt is wheelchair bound.  ?

## 2021-09-17 NOTE — Patient Instructions (Addendum)
Patient is a 41 yr old female with C6 tetraplegia- ASIA A with contractures-  due to [place]- with neurogenic bowel and bladder, spasticity and contractures and frequent UTI/sepsis/. Also has autonomic dysreflexia- intermittent. ?Here for f/u on SCI.  ? ?Needs to drink water- or lemonade- not the sugar filled- use sugar free.  ? ?2.  Think she needs to go to hospital- she looks like has UTI/infection ? ? ?3. To stop the clogging! ?Can use sterile water or saline- 2 tablespoons of white vinegar per liter,  8 oz cup of saline/sterile water mixed with 1 teaspoon of vinegar mix together- - can do every other day- most of my patients every week, but can do every other day- to fix the clogging issue.  ? ? ?4.  Not having Autonomic dysreflexia right now- BP 86/58- but appears like has UTI. Which can easily become sepsis in SCI patients.  ?Needs a Urinalysis- and urine Culture- and check CBC- with diff- so I think she is heading towards a bad infection- so I have asked pt to go to ER.  ? ?5. Explained spasticity/spasms worse when has infection.  ? ? ?6.  Can send in pain meds for now,  ? ? ?7. Needs oral drug screen to get pain meds, however pt is sick. Will try to get today.  ? ?8. Will renew Gabapentin 600 mg 2x/day for nerve pain ? ?9. Will renew Zanaflex 4 mg 4x/day for spasms ? ?10. Valium 10 mg tablets 3x/day- for spasms/spasticity.  ? ?11.  Need to check BP when she is sweating-  ?She will also take on temp of house-I think she's having autonomic dysreflexia- when this occurs- when can increase her risk of a stroke- BP >180 the higher number- HAVE to go to ER in this situation-  ? ?12. Will give 1 month of Norco 5/325 mg for now- last refill 11/22-  ? ?13.  Oxybutynin 5 mg 3x/day- for bladder spasms- to prevent pushing catheter out.  ? ?14. F/U in 3 months- double visit.  ? ?15. Will refer to Ortho at next visit- about L 5th digit contraction ? ?16. Very depressed- crying- saying "cannot do this". Will try Celexa  20 mg daily- for mood- HAS to take meds, fo rthis to work.  ?17. Stop Topiramate/Topamax- can make patients more depressed ?
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
## 2021-09-17 NOTE — ED Provider Triage Note (Signed)
Emergency Medicine Provider Triage Evaluation Note ? ?Tracey Hall , a 41 y.o. female  was evaluated in triage.  Pt complains of weakness.  Patient was sent over here by rehab center for evaluation of urinary tract infection.  Patient states she has been feeling increasingly more weak over the past couple of days.  She feels very fatigued.  She also complains of some generalized abdominal pain but says this has been going on for a while.  Says last night she had an episode of chest pain unusual for her.  She does not have this today.  She is not have any fevers that she knows of.  She does state that her blood pressures been lower than normal.  She has a history of quadriplegia following spinal cord injury. ? ?Review of Systems  ?Positive: Chest pain, generalized weakness, fatigue, abdominal pain ?Negative:  ? ?Physical Exam  ?BP 128/82 (BP Location: Left Arm)   Pulse 62   Temp (!) 97.5 ?F (36.4 ?C) (Oral)   Resp 15   SpO2 98%  ?Gen:   Awake, no distress   ?Resp:  Normal effort  ?MSK:   Moves extremities without difficulty  ?Other:  Patient is having difficulty keeping eyes open. She seems very tired.  Generalized abdominal tenderness to palpation. ? ?Medical Decision Making  ?Medically screening exam initiated at 1:55 PM.  Appropriate orders placed.  Tracey Hall was informed that the remainder of the evaluation will be completed by another provider, this initial triage assessment does not replace that evaluation, and the importance of remaining in the ED until their evaluation is complete. ? ? ?  ?Adolphus Birchwood, PA-C ?09/17/21 1357 ? ?

## 2021-09-17 NOTE — Discharge Instructions (Addendum)
Thank you for letting us care for you during your stay. You were admitted to the [facility]. You were admitted for an infection in your kidneys.  We have also sent in some refills on your pain medications to help you over the next several days.  Make sure to take a soapsuds enema daily and follow-up with Dr. Nori Riis in clinic. We have called and you are on a waitlist to have an earlier appointment with Dr. Alice Rieger. They will reach out to you when they have an earlier appointment. ? ? ?Please follow up with your primary care physician in 1 week.  ?If your symptoms worsen or return, please return to the hospital. ?Please let us know if you have questions about your stay at [facility].  ? ?

## 2021-09-17 NOTE — ED Notes (Signed)
Blood Consent obtained
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
## 2021-09-18 DIAGNOSIS — L89209 Pressure ulcer of unspecified hip, unspecified stage: Secondary | ICD-10-CM | POA: Diagnosis not present

## 2021-09-18 DIAGNOSIS — G894 Chronic pain syndrome: Secondary | ICD-10-CM | POA: Diagnosis present

## 2021-09-18 DIAGNOSIS — R109 Unspecified abdominal pain: Secondary | ICD-10-CM | POA: Diagnosis not present

## 2021-09-18 DIAGNOSIS — Z20822 Contact with and (suspected) exposure to covid-19: Secondary | ICD-10-CM | POA: Diagnosis present

## 2021-09-18 DIAGNOSIS — N3000 Acute cystitis without hematuria: Secondary | ICD-10-CM | POA: Diagnosis present

## 2021-09-18 DIAGNOSIS — G904 Autonomic dysreflexia: Secondary | ICD-10-CM | POA: Diagnosis present

## 2021-09-18 DIAGNOSIS — N12 Tubulo-interstitial nephritis, not specified as acute or chronic: Secondary | ICD-10-CM | POA: Diagnosis not present

## 2021-09-18 DIAGNOSIS — I951 Orthostatic hypotension: Secondary | ICD-10-CM | POA: Diagnosis present

## 2021-09-18 DIAGNOSIS — D509 Iron deficiency anemia, unspecified: Secondary | ICD-10-CM | POA: Diagnosis present

## 2021-09-18 DIAGNOSIS — J45909 Unspecified asthma, uncomplicated: Secondary | ICD-10-CM | POA: Diagnosis present

## 2021-09-18 DIAGNOSIS — K5903 Drug induced constipation: Secondary | ICD-10-CM | POA: Diagnosis present

## 2021-09-18 DIAGNOSIS — R4182 Altered mental status, unspecified: Secondary | ICD-10-CM | POA: Diagnosis present

## 2021-09-18 DIAGNOSIS — Z87828 Personal history of other (healed) physical injury and trauma: Secondary | ICD-10-CM

## 2021-09-18 DIAGNOSIS — R252 Cramp and spasm: Secondary | ICD-10-CM | POA: Diagnosis not present

## 2021-09-18 DIAGNOSIS — N319 Neuromuscular dysfunction of bladder, unspecified: Secondary | ICD-10-CM | POA: Diagnosis present

## 2021-09-18 DIAGNOSIS — Z823 Family history of stroke: Secondary | ICD-10-CM | POA: Diagnosis not present

## 2021-09-18 DIAGNOSIS — Z87820 Personal history of traumatic brain injury: Secondary | ICD-10-CM | POA: Diagnosis not present

## 2021-09-18 DIAGNOSIS — M792 Neuralgia and neuritis, unspecified: Secondary | ICD-10-CM | POA: Diagnosis not present

## 2021-09-18 DIAGNOSIS — D649 Anemia, unspecified: Secondary | ICD-10-CM | POA: Diagnosis not present

## 2021-09-18 DIAGNOSIS — G8250 Quadriplegia, unspecified: Secondary | ICD-10-CM | POA: Diagnosis present

## 2021-09-18 DIAGNOSIS — K592 Neurogenic bowel, not elsewhere classified: Secondary | ICD-10-CM | POA: Diagnosis present

## 2021-09-18 DIAGNOSIS — Z79899 Other long term (current) drug therapy: Secondary | ICD-10-CM | POA: Diagnosis not present

## 2021-09-18 DIAGNOSIS — F411 Generalized anxiety disorder: Secondary | ICD-10-CM | POA: Diagnosis present

## 2021-09-18 DIAGNOSIS — L89220 Pressure ulcer of left hip, unstageable: Secondary | ICD-10-CM | POA: Diagnosis present

## 2021-09-18 DIAGNOSIS — B9620 Unspecified Escherichia coli [E. coli] as the cause of diseases classified elsewhere: Secondary | ICD-10-CM | POA: Diagnosis present

## 2021-09-18 DIAGNOSIS — Z993 Dependence on wheelchair: Secondary | ICD-10-CM | POA: Diagnosis not present

## 2021-09-18 DIAGNOSIS — T402X5A Adverse effect of other opioids, initial encounter: Secondary | ICD-10-CM | POA: Diagnosis present

## 2021-09-18 DIAGNOSIS — G901 Familial dysautonomia [Riley-Day]: Secondary | ICD-10-CM | POA: Diagnosis present

## 2021-09-18 DIAGNOSIS — F329 Major depressive disorder, single episode, unspecified: Secondary | ICD-10-CM | POA: Diagnosis present

## 2021-09-18 DIAGNOSIS — I959 Hypotension, unspecified: Secondary | ICD-10-CM | POA: Diagnosis not present

## 2021-09-18 DIAGNOSIS — L8990 Pressure ulcer of unspecified site, unspecified stage: Secondary | ICD-10-CM | POA: Insufficient documentation

## 2021-09-18 DIAGNOSIS — N3289 Other specified disorders of bladder: Secondary | ICD-10-CM | POA: Diagnosis present

## 2021-09-18 LAB — TYPE AND SCREEN
ABO/RH(D): O POS
Antibody Screen: NEGATIVE
Unit division: 0
Unit division: 0

## 2021-09-18 LAB — BPAM RBC
Blood Product Expiration Date: 202304202359
Blood Product Expiration Date: 202304202359
ISSUE DATE / TIME: 202303291643
ISSUE DATE / TIME: 202303292207
Unit Type and Rh: 5100
Unit Type and Rh: 5100

## 2021-09-18 LAB — HEMOGLOBIN AND HEMATOCRIT, BLOOD
HCT: 27.6 % — ABNORMAL LOW (ref 36.0–46.0)
Hemoglobin: 7.9 g/dL — ABNORMAL LOW (ref 12.0–15.0)

## 2021-09-18 MED ORDER — DIAZEPAM 5 MG PO TABS
10.0000 mg | ORAL_TABLET | Freq: Two times a day (BID) | ORAL | Status: DC
  Administered 2021-09-18 – 2021-09-19 (×3): 10 mg via ORAL
  Filled 2021-09-18 (×3): qty 2

## 2021-09-18 MED ORDER — BISACODYL 10 MG RE SUPP
10.0000 mg | Freq: Every day | RECTAL | Status: DC
Start: 2021-09-18 — End: 2021-09-23
  Administered 2021-09-18 – 2021-09-22 (×3): 10 mg via RECTAL
  Filled 2021-09-18 (×6): qty 1

## 2021-09-18 MED ORDER — GABAPENTIN 300 MG PO CAPS
300.0000 mg | ORAL_CAPSULE | Freq: Every day | ORAL | Status: DC
  Administered 2021-09-18 – 2021-09-22 (×5): 300 mg via ORAL
  Filled 2021-09-18 (×5): qty 1

## 2021-09-18 MED ORDER — OXYBUTYNIN CHLORIDE 5 MG PO TABS
5.0000 mg | ORAL_TABLET | Freq: Three times a day (TID) | ORAL | Status: DC
  Administered 2021-09-18 – 2021-09-23 (×15): 5 mg via ORAL
  Filled 2021-09-18 (×17): qty 1

## 2021-09-18 MED ORDER — DULOXETINE HCL 30 MG PO CPEP
30.0000 mg | ORAL_CAPSULE | Freq: Every day | ORAL | Status: DC
  Administered 2021-09-18 – 2021-09-23 (×6): 30 mg via ORAL
  Filled 2021-09-18 (×6): qty 1

## 2021-09-18 MED ORDER — SODIUM CHLORIDE 0.9 % IV SOLN
1.0000 g | INTRAVENOUS | Status: DC
  Administered 2021-09-18 – 2021-09-19 (×2): 1 g via INTRAVENOUS
  Filled 2021-09-18 (×2): qty 10

## 2021-09-18 MED ORDER — MEDIHONEY WOUND/BURN DRESSING EX PSTE
1.0000 "application " | PASTE | Freq: Every day | CUTANEOUS | Status: DC
  Administered 2021-09-19 – 2021-09-23 (×5): 1 via TOPICAL
  Filled 2021-09-18 (×3): qty 44

## 2021-09-18 MED ORDER — CHLORHEXIDINE GLUCONATE CLOTH 2 % EX PADS
6.0000 | MEDICATED_PAD | Freq: Every day | CUTANEOUS | Status: DC
  Administered 2021-09-18 – 2021-09-23 (×6): 6 via TOPICAL

## 2021-09-18 NOTE — ED Notes (Signed)
I had no success in getting patient blood. I tried in 3 different areas. The Nurse was informed. ?

## 2021-09-18 NOTE — ED Notes (Signed)
Pt resting in bed with her eyes close. Even and non-labored respirations. NAD.  ? ?

## 2021-09-18 NOTE — Progress Notes (Signed)
Inpatient Rehab Admissions Coordinator:  ? ?Per therapy recommendations pt was screened for CIR by Shann Medal, PT, DPT.  Also discussed case with Dr. Dagoberto Ligas (pt's PM&R physician).  Pt does not meet medical criteria for CIR at this time.  Would recommend alternate level of care for therapy if still needed when ready to d/c.   ? ?Shann Medal, PT, DPT ?Admissions Coordinator ?[phone] ?09/18/21  ?3:00 PM ? ?
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
## 2021-09-18 NOTE — Evaluation (Signed)
Occupational Therapy Evaluation ?Patient Details ?Name: Tracey Hall ?MRN: [id number] ?DOB: 10-10-1980 ?Today's Date: 09/18/2021 ? ? ?History of Present Illness Pt is a 41 y/o female presenting on 3/29 with abdominal discomfort, nausea, and poor PO intake. Found with UTI.  PMH inculdes: SCI with quadriplegia, MDD, IDA, GI bleed.  ? ?Clinical Impression ?  ?Patient admitted for above and presents with problem list below, including pain, hypotension, decreased activity tolerance, and weakness.  Reports PTA needing assist from significant other for bathing/dressing, transfers, pt able to feed and groom without assist.  She currently requires up to total assist for ADLs, limited mobility to attempting chair position but unable to sustain HOB elevated due to dizziness and hypotension.  Based on performance today, patient will benefit from further OT services acutely and after dc at AIR level to optimize independence and decrease burden of care.  ?  ?Supine 88/57 (67), several attempts to elevate HOB (max <30*) BP dropping to 81/31 (47), returned to supine/trendelenburg 104/61 (75)  ? ?Recommendations for follow up therapy are one component of a multi-disciplinary discharge planning process, led by the attending physician.  Recommendations may be updated based on patient status, additional functional criteria and insurance authorization.  ? ?Follow Up Recommendations ? Acute inpatient rehab (3hours/day)  ?  ?Assistance Recommended at Discharge Frequent or constant Supervision/Assistance  ?Patient can return home with the following Two people to help with walking and/or transfers;Two people to help with bathing/dressing/bathroom;Assistance with cooking/housework;Assist for transportation;Help with stairs or ramp for entrance ? ?  ?Functional Status Assessment ? Patient has had a recent decline in their functional status and demonstrates the ability to make significant improvements in function in a reasonable and  predictable amount of time.  ?Equipment Recommendations ? Hospital bed;Other (comment) (hoyer lift)  ?  ?Recommendations for Other Services Rehab consult ? ? ?  ?Precautions / Restrictions Precautions ?Precautions: Fall ?Precaution Comments: soft BP ?Restrictions ?Weight Bearing Restrictions: No  ? ?  ? ?Mobility Bed Mobility ?Overal bed mobility: Needs Assistance ?Bed Mobility: Supine to Sit ?  ?  ?Supine to sit: Total assist ?  ?  ?General bed mobility comments: Pt required HOB to be raised/lowered very slowly due to pain. Focused on chair positioning on stretcher for safety. BP in supine 88/57, slightly elevated HOB 88/63, HOB more elevated 81/62, HOB returned to slightly elevated 81/31. Pt was returned to supine in trendelenburg position. Pt BP 104/61. She was left trended. Further mobility deferred due to soft BP and pt's pain response with positional changes. ?  ? ?Transfers ?  ?  ?  ?  ?  ?  ?  ?  ?  ?  ?  ? ?  ?Balance   ?  ?  ?  ?  ?  ?  ?  ?  ?  ?  ?  ?  ?  ?  ?  ?  ?  ?  ?   ? ?ADL either performed or assessed with clinical judgement  ? ?ADL Overall ADL's : Needs assistance/impaired ?  ?  ?Grooming: Minimal assistance;Bed level ?  ?  ?  ?  ?  ?Upper Body Dressing : Maximal assistance;Bed level ?  ?Lower Body Dressing: Total assistance;Bed level;+2 for physical assistance;+2 for safety/equipment ?  ?  ?Toilet Transfer Details (indicate cue type and reason): deferred ?  ?  ?  ?  ?  ?General ADL Comments: limited due to hypotension, pain.  Attempted long sitting, unable to manged HOB elevated.  ? ? ? ?  Vision   ?Vision Assessment?: No apparent visual deficits  ?   ?Perception   ?  ?Praxis   ?  ? ?Pertinent Vitals/Pain Pain Assessment ?Pain Assessment: Faces ?Faces Pain Scale: Hurts whole lot ?Pain Location: "all over" ?Pain Descriptors / Indicators: Grimacing, Moaning ?Pain Intervention(s): Limited activity within patient's tolerance, Monitored during session, Repositioned, Premedicated before session   ? ? ? ?Hand Dominance Right ?  ?Extremity/Trunk Assessment Upper Extremity Assessment ?Upper Extremity Assessment: RUE deficits/detail;LUE deficits/detail ?RUE Deficits / Details: hx of SCI injury C6-7. Able to raise arm to 90, flex/extend at elbow and use functionally.  Limited movement of hand, able to feed self and complete grooming with R UE. ?LUE Deficits / Details: hx of SCI injury C6-7. Able to raise arm to 90* shoulder flexion but no control of triceps. ?  ?Lower Extremity Assessment ?Lower Extremity Assessment: Defer to PT evaluation ?  ?  ?  ?Communication Communication ?Communication: No difficulties ?  ?Cognition Arousal/Alertness: Awake/alert ?Behavior During Therapy: Anxious, Restless ?Overall Cognitive Status: Within Functional Limits for tasks assessed ?  ?  ?  ?  ?  ?  ?  ?  ?  ?  ?  ?  ?  ?  ?  ?  ?General Comments: pt with decreased health literacy. ?  ?  ?General Comments  L hip wound, wound care assessing upon exit ? ?  ?Exercises   ?  ?Shoulder Instructions    ? ? ?Home Living Family/patient expects to be discharged to:: Private residence ?Living Arrangements: Spouse/significant other ?Available Help at Discharge: Family ?Type of Home: House ?Home Access: Stairs to enter (stairs to enter with tracks over top to pull w/c up) ?Entrance Stairs-Number of Steps: 3-4 ?  ?Home Layout: One level ?  ?  ?Bathroom Shower/Tub: Sponge bathes at baseline ?  ?Bathroom Toilet:  (does not use) ?  ?  ?Home Equipment: Wheelchair - power;Other (comment) (sliding board and adjustable bed) ?  ?  ?  ? ?  ?Prior Functioning/Environment Prior Level of Function : Needs assist ?  ?  ?  ?  ?  ?  ?Mobility Comments: "Tracey Hall picks me up" to get in the wheelchair ?ADLs Comments: assist for basin bathing and dressing from bed, pt self feeds and grooms. Wears depends, cath for urine. ?  ? ?  ?  ?OT Problem List: Decreased strength;Decreased range of motion;Decreased activity tolerance;Impaired balance (sitting and/or  standing);Decreased coordination;Decreased safety awareness;Decreased knowledge of use of DME or AE;Decreased knowledge of precautions;Cardiopulmonary status limiting activity;Obesity;Pain;Impaired UE functional use;Impaired sensation;Impaired tone ?  ?   ?OT Treatment/Interventions: Self-care/ADL training;Neuromuscular education;DME and/or AE instruction;Therapeutic activities;Cognitive remediation/compensation;Balance training;Patient/family education  ?  ?OT Goals(Current goals can be found in the care plan section) Acute Rehab OT Goals ?Patient Stated Goal: get better ?OT Goal Formulation: With patient ?Time For Goal Achievement: 10/02/21 ?Potential to Achieve Goals: Good  ?OT Frequency: Min 2X/week ?  ? ?Co-evaluation PT/OT/SLP Co-Evaluation/Treatment: Yes ?Reason for Co-Treatment: Complexity of the patient's impairments (multi-system involvement);For patient/therapist safety;To address functional/ADL transfers ?PT goals addressed during session: Mobility/safety with mobility;Balance;Strengthening/ROM ?OT goals addressed during session: ADL's and self-care ?  ? ?  ?AM-PAC OT "6 Clicks" Daily Activity     ?Outcome Measure Help from another person eating meals?: A Little ?Help from another person taking care of personal grooming?: A Little ?Help from another person toileting, which includes using toliet, bedpan, or urinal?: Total ?Help from another person bathing (including washing, rinsing, drying)?: A Lot ?Help from another person  to put on and taking off regular upper body clothing?: A Lot ?Help from another person to put on and taking off regular lower body clothing?: Total ?6 Click Score: 12 ?  ?End of Session Nurse Communication: Mobility status ? ?Activity Tolerance: Treatment limited secondary to medical complications (Comment) ?Patient left: in bed;with call bell/phone within reach;with family/visitor present ? ?OT Visit Diagnosis: Other symptoms and signs involving the nervous system  (R29.898);Pain;Muscle weakness (generalized) (M62.81) ?Pain - part of body:  ("all over")  ?              ?Time: 8403-7543 ?OT Time Calculation (min): 35 min ?Charges:  OT General Charges ?$OT Visit: 1 Visit ?OT Evaluation ?$OT Eval Moderate Com

## 2021-09-18 NOTE — Evaluation (Signed)
Physical Therapy Evaluation ?Patient Details ?Name: Tracey Hall ?MRN: [id number] ?DOB: [birth year] ?Today's Date: 09/18/2021 ? ?History of Present Illness ? Pt is a 41 y/o female presenting on 3/29 with abdominal discomfort, nausea, and poor PO intake. Found with UTI.  PMH inculdes: SCI with quadriplegia, MDD, IDA, GI bleed.  ?Clinical Impression ? Patient presented to the ED on 09/17/28 with abdominal pain, nausea, and poor PO intake consistent with patient's diagnosis of a UTI. Pt's impairments include decreased cardiopulmonary conditioning, strength, activity tolerance, and balance. These impairments are limiting her ability to safely and independently transfer to and from her wheelchair and change positions in bed. Attempted chair position, however, pt with hypotension; see below. Pt unable to tolerate further mobility at this time. RN notified. Pt has had decline in mobility and reliant of significant other for mobility tasks. SPT recommending CIR upon D/C to decrease burden of care at home and to increase safety with transfers. PT will continue to follow acutely to maximize pt's safety and independence with functional mobility. ? ?Orthostatic BPs ? ?Supine 88/57 (67)  ?HOB slightly elevated 88/63 (68)  ?HOB more elevated 81/62 (69)  ?Returned to [facility] slightly elevated 81/31 (47)  ?Supine and trendelenburg 104/61 (75)  ?  ?   ?   ? ?Recommendations for follow up therapy are one component of a multi-disciplinary discharge planning process, led by the attending physician.  Recommendations may be updated based on patient status, additional functional criteria and insurance authorization. ? ?Follow Up Recommendations Acute inpatient rehab (3hours/day) ? ?  ?Assistance Recommended at Discharge Frequent or constant Supervision/Assistance  ?Patient can return home with the following ? Two people to help with walking and/or transfers;A lot of help with bathing/dressing/bathroom;Assistance with cooking/housework;Direct  supervision/assist for medications management;Direct supervision/assist for financial management;Assist for transportation;Help with stairs or ramp for entrance ? ?  ?Equipment Recommendations Hospital bed;Other (comment) (hoyer lift)  ?Recommendations for Other Services ? Rehab consult  ?  ?Functional Status Assessment Patient has had a recent decline in their functional status and demonstrates the ability to make significant improvements in function in a reasonable and predictable amount of time.  ? ?  ?Precautions / Restrictions Precautions ?Precautions: Fall ?Precaution Comments: soft BP ?Restrictions ?Weight Bearing Restrictions: No  ? ?  ? ?Mobility ? Bed Mobility ?Overal bed mobility: Needs Assistance ?Bed Mobility: Supine to Sit ?  ?  ?Supine to sit: Total assist ?  ?  ?General bed mobility comments: Pt required HOB to be raised/lowered very slowly due to pain. Focused on chair positioning on stretcher for safety. BP in supine 88/57, slightly elevated HOB 88/63, HOB more elevated 81/62, HOB returned to slightly elevated 81/31. Pt was returned to supine in trendelenburg position. Pt BP 104/61. She was left trended. Further mobility deferred due to soft BP and pt's pain response with positional changes. ?  ? ?Transfers ?  ?  ?  ?  ?  ?  ?  ?  ?  ?  ?  ? ?Ambulation/Gait ?  ?  ?  ?  ?  ?  ?  ?  ? ?Stairs ?  ?  ?  ?  ?  ? ?Wheelchair Mobility ?  ? ?Modified Rankin (Stroke Patients Only) ?  ? ?  ? ?Balance   ?  ?  ?  ?  ?  ?  ?  ?  ?  ?  ?  ?  ?  ?  ?  ?  ?  ?  ?   ? ? ? ?  Pertinent Vitals/Pain Pain Assessment ?Pain Assessment: Faces ?Faces Pain Scale: Hurts whole lot ?Pain Location: "all over" ?Pain Descriptors / Indicators: Grimacing, Moaning ?Pain Intervention(s): Limited activity within patient's tolerance, Monitored during session, Premedicated before session  ? ? ?Home Living Family/patient expects to be discharged to:: Private residence ?Living Arrangements: Spouse/significant other ?Available Help at  Discharge: Family ?Type of Home: House ?Home Access: Stairs to enter (stairs to enter with tracks over top to pull w/c up) ?  ?Entrance Stairs-Number of Steps: 3-4 ?  ?Home Layout: One level ?Home Equipment: Wheelchair - power;Other (comment) (sliding board and adjustable bed) ?   ?  ?Prior Function Prior Level of Function : Needs assist ?  ?  ?  ?  ?  ?  ?Mobility Comments: "Daryl picks me up" to get in the wheelchair ?ADLs Comments: assist for basin bathing and dressing from bed, pt self feeds and grooms. Wears depends, cath for urine. ?  ? ? ?Hand Dominance  ? Dominant Hand: Right ? ?  ?Extremity/Trunk Assessment  ? Upper Extremity Assessment ?Upper Extremity Assessment: Defer to OT evaluation ?  ? ?Lower Extremity Assessment ?Lower Extremity Assessment:  (no muscle activation in LEs from SCI at C6-7; only occassional muscle spasms.) ?  ? ?   ?Communication  ? Communication: No difficulties  ?Cognition Arousal/Alertness: Awake/alert ?Behavior During Therapy: Anxious, Restless ?Overall Cognitive Status: Within Functional Limits for tasks assessed ?  ?  ?  ?  ?  ?  ?  ?  ?  ?  ?  ?  ?  ?  ?  ?  ?General Comments: pt with decreased health literacy. ?  ?  ? ?  ?General Comments General comments (skin integrity, edema, etc.): Pt has wound located on L hip. Wound care nursing came in as PT finishing assessment ? ?  ?Exercises    ? ?Assessment/Plan  ?  ?PT Assessment Patient needs continued PT services  ?PT Problem List Decreased strength;Decreased activity tolerance;Decreased balance;Decreased mobility;Decreased safety awareness;Decreased knowledge of precautions;Cardiopulmonary status limiting activity;Pain ? ?   ?  ?PT Treatment Interventions DME instruction;Functional mobility training;Therapeutic activities;Therapeutic exercise;Balance training;Neuromuscular re-education;Patient/family education;Wheelchair mobility training;Manual techniques;Modalities   ? ?PT Goals (Current goals can be found in the Care Plan  section)  ?Acute Rehab PT Goals ?Patient Stated Goal: to decrease pain ?PT Goal Formulation: With patient ?Time For Goal Achievement: 10/02/21 ?Potential to Achieve Goals: Good ? ?  ?Frequency Min 3X/week ?  ? ? ?Co-evaluation PT/OT/SLP Co-Evaluation/Treatment: Yes ?Reason for Co-Treatment: Complexity of the patient's impairments (multi-system involvement);Necessary to address cognition/behavior during functional activity;For patient/therapist safety;To address functional/ADL transfers ?PT goals addressed during session: Mobility/safety with mobility;Balance;Strengthening/ROM ?  ?  ? ? ?  ?AM-PAC PT "6 Clicks" Mobility  ?Outcome Measure Help needed turning from your back to your side while in a flat bed without using bedrails?: A Lot ?Help needed moving from lying on your back to sitting on the side of a flat bed without using bedrails?: Total ?Help needed moving to and from a bed to a chair (including a wheelchair)?: Total ?Help needed standing up from a chair using your arms (e.g., wheelchair or bedside chair)?: Total ?Help needed to walk in hospital room?: Total ?Help needed climbing 3-5 steps with a railing? : Total ?6 Click Score: 7 ? ?  ?End of Session   ?Activity Tolerance: Treatment limited secondary to medical complications (Comment);Patient limited by pain (Low blood pressure) ?Patient left: in bed;with call bell/phone within reach;with family/visitor present;with nursing/sitter in room;Other (comment) (  pt left in trendelenburg position on stretcher in ED) ?Nurse Communication: Mobility status;Other (comment) (soft BP, pt left in trended position) ?PT Visit Diagnosis: Muscle weakness (generalized) (M62.81);Other abnormalities of gait and mobility (R26.89);Pain;Other symptoms and signs involving the nervous system (R29.898) ?Pain - part of body:  ("all over") ?  ? ?Time: 7793-9030 ?PT Time Calculation (min) (ACUTE ONLY): 36 min ? ? ?Charges:   PT Evaluation ?$PT Eval Moderate Complexity: 1 Mod ?  ?  ?    ? ? ?Jonne Ply, SPT ? ?Jonne Ply ?09/18/2021, 11:21 AM ? ?

## 2021-09-18 NOTE — ED Notes (Signed)
Breakfast order placed ?

## 2021-09-18 NOTE — Progress Notes (Incomplete)
1400 - Pt arrived from ED.  Pt moved to 5W11.  Pt bathed with CHG and placed in a new gown. ?1630 - Pt noted to be hypotensive.  Pt in a seated position.  Pt awakened and reclined.  BP improved.  Pt A&O x 4 w/o neurological deficits.  No SOB. ?1700 - Pt with lg BM.  Pt's SO helped clean pt up.  Foley & Peri care performed.  Pt's gown and bed pad changed. ?1800 - Secure chat sent to Dr. Nori Riis, which was forwarded to the group.  Dr. Lurline Hare responded.  Will continue to monitor. ?1900 - Report given to PM RN.  All questions answered. ? ?

## 2021-09-18 NOTE — Progress Notes (Signed)
FPTS Brief Progress Note ? ?S: Patient was resting comfortably on evaluation.  Rounded with night nurse who voiced no concerns at this time. ? ? ?O: ?BP 105/63 (BP Location: Right Arm)   Pulse 82   Temp 97.8 ?F (36.6 ?C) (Oral)   Resp 15   Ht '5\' 5"'$  (1.651 m)   Wt 95 kg   SpO2 100%   BMI 34.85 kg/m?   ? ? ?A/P: ?Continue plan per day team, no changes necessary at this time. ?- Orders reviewed. Labs for AM ordered, which was adjusted as needed.  ? ?Gifford Shave, MD ?09/18/2021, 11:42 PM ?PGY-3, [place] Night Resident  ?Please page [phone] with questions.  ? ? ?

## 2021-09-18 NOTE — Progress Notes (Signed)
Initial Nutrition Assessment ? ?DOCUMENTATION CODES:  ? ?Not applicable ? ?INTERVENTION:  ? ?Multivitamin w/ minerals daily @ bedtime ?30 ml ProSource Plus BID, each supplement provides 100 kcals and 15 grams protein.  ?Meal ordering with assist ? ?NUTRITION DIAGNOSIS:  ? ?Inadequate oral intake related to poor appetite as evidenced by per patient/family report. ? ?GOAL:  ? ?Patient will meet greater than or equal to 90% of their needs ? ?MONITOR:  ? ?PO intake, Supplement acceptance, Labs, Weight trends, Skin ? ?REASON FOR ASSESSMENT:  ? ?Consult ?Assessment of nutrition requirement/status, Poor PO ? ?ASSESSMENT:  ? ?41 y.o. female presented to the ED with abdominal discomfort, nausea, and poor PO intake. PMH includes quadriplegic, MDD, and IDA. Pt admitted with UTI with concern for pyelonephritis.  ? ?Pt resting in bed, family at bedside. ? ?Pt reports that she has had a poor appetite for years. Reports that she mainly eats on ice. Family states that she will nibble on food. Pt said that she will think something sounds good in her head, then get it and not taste good or will not want it any more. Pt did report that she does not eat much due to not wanting to gain weight. Pt endorses ongoing nausea, but no vomiting.  ? ?Per EMR, pt has had a 3% weight loss within 8 months, which is not clinically significant for time frame.  ? ?Discussed the importance of proper nutrition for promoting wound healing and preventing further loss of lean muscle mass. Pt expresses understanding. Discussed the use of ONS to help with wound healing. Pt reports that she does not like Ensure due to making family sick in the past. Pt agreeable to try ProSource. Discussed that ProSource can be took alone or mixed with a beverage, it is up to the pt.  ? ?Medications reviewed and include: Dulcolax, IV antibiotics  ?Labs reviewed. ? ?NUTRITION - FOCUSED PHYSICAL EXAM: ? ?Deferred to follow-up. ? ?Diet Order:   ?Diet Order   ? ?       ?  Diet  regular Room service appropriate? Yes; Fluid consistency: Thin  Diet effective now       ?  ? ?  ?  ? ?  ? ? ?EDUCATION NEEDS:  ? ?No education needs have been identified at this time ? ?Skin:  Skin Assessment: Skin Integrity Issues: ?Skin Integrity Issues:: Stage III ?Stage III: L Thigh ? ?Last BM:  3/30 ? ?Height:  ? ?Ht Readings from Last 1 Encounters:  ?09/18/21 '5\' 5"'$  (1.651 m)  ? ? ?Weight:  ? ?Wt Readings from Last 1 Encounters:  ?09/18/21 95 kg  ? ? ?Ideal Body Weight:  56.8 kg ? ?BMI:  Body mass index is 34.85 kg/m?. ? ?Estimated Nutritional Needs:  ? ?Kcal:  1900-2100 ? ?Protein:  95-110 grams ? ?Fluid:  >/= 1.9 L ? ? ? ?Hermina Barters RD, LDN ?Clinical Dietitian ?See [facility] for contact information.  ? ?
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
## 2021-09-19 ENCOUNTER — Other Ambulatory Visit: Payer: Self-pay | Admitting: Physical Medicine and Rehabilitation

## 2021-09-19 DIAGNOSIS — N12 Tubulo-interstitial nephritis, not specified as acute or chronic: Secondary | ICD-10-CM | POA: Diagnosis not present

## 2021-09-19 DIAGNOSIS — K592 Neurogenic bowel, not elsewhere classified: Secondary | ICD-10-CM

## 2021-09-19 DIAGNOSIS — N319 Neuromuscular dysfunction of bladder, unspecified: Secondary | ICD-10-CM | POA: Diagnosis not present

## 2021-09-19 DIAGNOSIS — Z87828 Personal history of other (healed) physical injury and trauma: Secondary | ICD-10-CM | POA: Diagnosis not present

## 2021-09-19 DIAGNOSIS — R252 Cramp and spasm: Secondary | ICD-10-CM

## 2021-09-19 LAB — BASIC METABOLIC PANEL
Anion gap: 9 (ref 5–15)
BUN: <5 mg/dL — ABNORMAL LOW (ref 6–20)
CO2: 25 mmol/L (ref 22–32)
Calcium: 8.8 mg/dL — ABNORMAL LOW (ref 8.9–10.3)
Chloride: 102 mmol/L (ref 98–111)
Creatinine, Ser: 0.51 mg/dL (ref 0.44–1.00)
GFR, Estimated: >60 mL/min (ref >60)
Glucose, Bld: 79 mg/dL (ref 70–99)
Potassium: 4 mmol/L (ref 3.5–5.1)
Sodium: 136 mmol/L (ref 135–145)

## 2021-09-19 LAB — CBC
HCT: 27.8 % — ABNORMAL LOW (ref 36.0–46.0)
Hemoglobin: 8 g/dL — ABNORMAL LOW (ref 12.0–15.0)
MCH: 19.1 pg — ABNORMAL LOW (ref 26.0–34.0)
MCHC: 28.8 g/dL — ABNORMAL LOW (ref 30.0–36.0)
MCV: 66.5 fL — ABNORMAL LOW (ref 80.0–100.0)
Platelets: 538 10*3/uL — ABNORMAL HIGH (ref 150–400)
RBC: 4.18 MIL/uL (ref 3.87–5.11)
RDW: 24 % — ABNORMAL HIGH (ref 11.5–15.5)
WBC: 11.3 10*3/uL — ABNORMAL HIGH (ref 4.0–10.5)
nRBC: 0 % (ref 0.0–0.2)

## 2021-09-19 MED ORDER — PROSOURCE PLUS PO LIQD
30.0000 mL | Freq: Two times a day (BID) | ORAL | Status: DC
  Administered 2021-09-19 – 2021-09-23 (×8): 30 mL via ORAL
  Filled 2021-09-19 (×8): qty 30

## 2021-09-19 MED ORDER — ADULT MULTIVITAMIN W/MINERALS CH
1.0000 | ORAL_TABLET | Freq: Every day | ORAL | Status: DC
  Administered 2021-09-19 – 2021-09-22 (×4): 1 via ORAL
  Filled 2021-09-19 (×4): qty 1

## 2021-09-19 MED ORDER — DIAZEPAM 5 MG PO TABS
5.0000 mg | ORAL_TABLET | Freq: Two times a day (BID) | ORAL | Status: DC
  Administered 2021-09-19 – 2021-09-23 (×8): 5 mg via ORAL
  Filled 2021-09-19 (×8): qty 1

## 2021-09-19 MED ORDER — MIDODRINE HCL 5 MG PO TABS
5.0000 mg | ORAL_TABLET | Freq: Three times a day (TID) | ORAL | Status: DC
  Administered 2021-09-19 – 2021-09-22 (×8): 5 mg via ORAL
  Filled 2021-09-19 (×9): qty 1

## 2021-09-19 MED ORDER — DIAZEPAM 10 MG PO TABS: 10.0000 mg | ORAL_TABLET | Freq: Three times a day (TID) | ORAL | 5 refills | Status: DC

## 2021-09-19 MED ORDER — GABAPENTIN 300 MG PO CAPS: 600.0000 mg | ORAL_CAPSULE | Freq: Three times a day (TID) | ORAL | 5 refills | Status: DC

## 2021-09-19 MED ORDER — SODIUM CHLORIDE 0.9 % IV SOLN: INTRAVENOUS | Status: DC

## 2021-09-19 NOTE — Progress Notes (Signed)
FPTS Brief Progress Note ? ?S: Spoke with patient tonight.  Reports that she is still feeling some lightheadedness when she sits up.  She reports that she has been trying to drink fluids but has not been doing too well at this.  Reports that she also just feels tired. ? ? ?O: ?BP 126/74 (BP Location: Right Arm)   Pulse 64   Temp 97.9 ?F (36.6 ?C) (Oral)   Resp 19   Ht '5\' 5"'$  (1.651 m)   Wt 95 kg   SpO2 98%   BMI 34.85 kg/m?   ?General: Pleasant 41 year old female resting comfortably in bed ? ?A/P: ?Patient reports some p.o. liquid intake but not much.  Patient currently on 135 mL maintenance fluids.  I am going to decrease this to 90 mL and encouraged her to monitor her liquid intake and let daytime provider know.  Consider discontinuation of maintenance fluids in the morning.  All other plans per day team. ?- Orders reviewed. Labs for AM ordered, which was adjusted as needed.  ? ?Gifford Shave, MD ?09/19/2021, 9:37 PM ?PGY-3, [place] Night Resident  ?Please page [phone] with questions.  ? ? ?
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
## 2021-09-20 DIAGNOSIS — N12 Tubulo-interstitial nephritis, not specified as acute or chronic: Secondary | ICD-10-CM | POA: Diagnosis not present

## 2021-09-20 DIAGNOSIS — Z87828 Personal history of other (healed) physical injury and trauma: Secondary | ICD-10-CM | POA: Diagnosis not present

## 2021-09-20 DIAGNOSIS — K592 Neurogenic bowel, not elsewhere classified: Secondary | ICD-10-CM | POA: Diagnosis not present

## 2021-09-20 LAB — CBC
HCT: 24.9 % — ABNORMAL LOW (ref 36.0–46.0)
Hemoglobin: 7 g/dL — ABNORMAL LOW (ref 12.0–15.0)
MCH: 19 pg — ABNORMAL LOW (ref 26.0–34.0)
MCHC: 28.1 g/dL — ABNORMAL LOW (ref 30.0–36.0)
MCV: 67.7 fL — ABNORMAL LOW (ref 80.0–100.0)
Platelets: 456 10*3/uL — ABNORMAL HIGH (ref 150–400)
RBC: 3.68 MIL/uL — ABNORMAL LOW (ref 3.87–5.11)
RDW: 24.8 % — ABNORMAL HIGH (ref 11.5–15.5)
WBC: 10.1 10*3/uL (ref 4.0–10.5)
nRBC: 0 % (ref 0.0–0.2)

## 2021-09-20 LAB — BASIC METABOLIC PANEL
Anion gap: 5 (ref 5–15)
BUN: 6 mg/dL (ref 6–20)
CO2: 25 mmol/L (ref 22–32)
Calcium: 8.5 mg/dL — ABNORMAL LOW (ref 8.9–10.3)
Chloride: 105 mmol/L (ref 98–111)
Creatinine, Ser: 0.39 mg/dL — ABNORMAL LOW (ref 0.44–1.00)
GFR, Estimated: >60 mL/min (ref >60)
Glucose, Bld: 127 mg/dL — ABNORMAL HIGH (ref 70–99)
Potassium: 3.5 mmol/L (ref 3.5–5.1)
Sodium: 135 mmol/L (ref 135–145)

## 2021-09-20 LAB — URINE CULTURE: Culture: >=100000 — AB

## 2021-09-20 MED ORDER — MIDODRINE HCL 5 MG PO TABS: 5.0000 mg | ORAL_TABLET | Freq: Three times a day (TID) | ORAL | 0 refills | Status: DC

## 2021-09-20 MED ORDER — DIAZEPAM 5 MG PO TABS: 5.0000 mg | ORAL_TABLET | Freq: Two times a day (BID) | ORAL | 0 refills | Status: DC

## 2021-09-20 MED ORDER — GABAPENTIN 300 MG PO CAPS: 600.0000 mg | ORAL_CAPSULE | Freq: Two times a day (BID) | ORAL | 0 refills | Status: DC

## 2021-09-20 MED ORDER — OXYBUTYNIN CHLORIDE 5 MG PO TABS: 5.0000 mg | ORAL_TABLET | Freq: Three times a day (TID) | ORAL | 0 refills | Status: AC

## 2021-09-20 MED ORDER — HYDROCODONE-ACETAMINOPHEN 10-325 MG PO TABS: 1.0000 | ORAL_TABLET | Freq: Three times a day (TID) | ORAL | 0 refills | Status: DC | PRN

## 2021-09-20 MED ORDER — DULOXETINE HCL 30 MG PO CPEP: 30.0000 mg | ORAL_CAPSULE | Freq: Every day | ORAL | 1 refills | Status: DC

## 2021-09-20 MED ORDER — CEPHALEXIN 500 MG PO CAPS: 500.0000 mg | ORAL_CAPSULE | Freq: Four times a day (QID) | ORAL | 0 refills | Status: DC

## 2021-09-20 MED ORDER — MEDIHONEY WOUND/BURN DRESSING EX PSTE: 1.0000 "application " | PASTE | Freq: Every day | CUTANEOUS | 0 refills | Status: DC

## 2021-09-20 MED ORDER — CEPHALEXIN 500 MG PO CAPS
500.0000 mg | ORAL_CAPSULE | Freq: Four times a day (QID) | ORAL | Status: DC
  Administered 2021-09-20 – 2021-09-23 (×11): 500 mg via ORAL
  Filled 2021-09-20 (×11): qty 1

## 2021-09-20 MED ORDER — GABAPENTIN 300 MG PO CAPS
300.0000 mg | ORAL_CAPSULE | Freq: Every day | ORAL | 0 refills | Status: DC
Start: 2021-09-20 — End: 2021-09-23
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
## 2021-09-20 NOTE — Progress Notes (Signed)
CALL PAGER [phone] for any questions or notifications regarding this patient  ?FMTS Attending Note: Dorcas Mcmurray MD ?Pyelonephritis: sensitivities back and we are changing from IV antibiotics to oral this afternoon.  ?Pain is not well controlled and we will attempt adjustment for better control. Some of this is long standing and I do not think we will be able to eradicate it. Have added duloxetine and adjusted gabapentin dosing. ?Autonomic dysfunction is slightly improved wit the addition of midodrine. I WIll follow this up as outpatient as she will be following in my FM continuity clinic. ?Low iron: will plan possible iron infusion. ?Neurogenic bladder and bowel: continue foley catheter now. Will exlore other options long term as outpatient. Will add daily enema or neurogenic bowel. ?Discussed diet: she does not eat much protein. May benefit from nutrition appt as outpatient. ?

## 2021-09-21 ENCOUNTER — Inpatient Hospital Stay (HOSPITAL_COMMUNITY): Payer: Medicare Other

## 2021-09-21 DIAGNOSIS — N319 Neuromuscular dysfunction of bladder, unspecified: Secondary | ICD-10-CM | POA: Diagnosis not present

## 2021-09-21 DIAGNOSIS — D649 Anemia, unspecified: Secondary | ICD-10-CM

## 2021-09-21 DIAGNOSIS — G8250 Quadriplegia, unspecified: Secondary | ICD-10-CM

## 2021-09-21 DIAGNOSIS — K592 Neurogenic bowel, not elsewhere classified: Secondary | ICD-10-CM | POA: Diagnosis not present

## 2021-09-21 DIAGNOSIS — Z87828 Personal history of other (healed) physical injury and trauma: Secondary | ICD-10-CM | POA: Diagnosis not present

## 2021-09-21 DIAGNOSIS — N12 Tubulo-interstitial nephritis, not specified as acute or chronic: Secondary | ICD-10-CM | POA: Diagnosis not present

## 2021-09-21 LAB — CBC
HCT: 28 % — ABNORMAL LOW (ref 36.0–46.0)
Hemoglobin: 7.9 g/dL — ABNORMAL LOW (ref 12.0–15.0)
MCH: 19.1 pg — ABNORMAL LOW (ref 26.0–34.0)
MCHC: 28.2 g/dL — ABNORMAL LOW (ref 30.0–36.0)
MCV: 67.6 fL — ABNORMAL LOW (ref 80.0–100.0)
Platelets: 566 10*3/uL — ABNORMAL HIGH (ref 150–400)
RBC: 4.14 MIL/uL (ref 3.87–5.11)
RDW: 25.5 % — ABNORMAL HIGH (ref 11.5–15.5)
WBC: 9.7 10*3/uL (ref 4.0–10.5)
nRBC: 0 % (ref 0.0–0.2)

## 2021-09-21 LAB — BASIC METABOLIC PANEL
Anion gap: 7 (ref 5–15)
BUN: 7 mg/dL (ref 6–20)
CO2: 27 mmol/L (ref 22–32)
Calcium: 9 mg/dL (ref 8.9–10.3)
Chloride: 103 mmol/L (ref 98–111)
Creatinine, Ser: 0.55 mg/dL (ref 0.44–1.00)
GFR, Estimated: >60 mL/min (ref >60)
Glucose, Bld: 95 mg/dL (ref 70–99)
Potassium: 4.2 mmol/L (ref 3.5–5.1)
Sodium: 137 mmol/L (ref 135–145)

## 2021-09-21 MED ORDER — SODIUM CHLORIDE 0.9% FLUSH
10.0000 mL | Freq: Two times a day (BID) | INTRAVENOUS | Status: DC
  Administered 2021-09-22 – 2021-09-23 (×3): 10 mL

## 2021-09-21 MED ORDER — MILK AND MOLASSES ENEMA
1.0000 | Freq: Once | RECTAL | Status: AC
  Administered 2021-09-21: 240 mL via RECTAL
  Filled 2021-09-21: qty 240

## 2021-09-21 MED ORDER — SODIUM CHLORIDE 0.9% FLUSH: 10.0000 mL | INTRAVENOUS | Status: DC | PRN

## 2021-09-21 MED ORDER — LINACLOTIDE 145 MCG PO CAPS
145.0000 ug | ORAL_CAPSULE | Freq: Every day | ORAL | Status: DC
  Administered 2021-09-22: 145 ug via ORAL
  Filled 2021-09-21 (×2): qty 1

## 2021-09-21 MED ORDER — NA FERRIC GLUC CPLX IN SUCROSE 12.5 MG/ML IV SOLN
250.0000 mg | Freq: Every day | INTRAVENOUS | Status: AC
  Administered 2021-09-21 – 2021-09-22 (×2): 250 mg via INTRAVENOUS
  Filled 2021-09-21 (×3): qty 20

## 2021-09-21 MED ORDER — LIDOCAINE HCL URETHRAL/MUCOSAL 2 % EX GEL
1.0000 "application " | CUTANEOUS | Status: DC
  Filled 2021-09-21: qty 6

## 2021-09-21 NOTE — Progress Notes (Signed)
Family Medicine Teaching Service ?Daily Progress Note ?Intern Pager: [phone] ? ?Patient name: Tracey Hall Medical record number: [id number] ?Date of birth: [birth year] Age: 41 y.o. Gender: female ? ?Primary Care Provider: Camillia Herter, NP ?Consultants: None ?Code Status: Full ? ?Pt Overview and Major Events to Date:  ?3/29-admitted: Ceftriaxone started ?3/30-2 units PRBC transfused ?3/31-urine culture greater than 100,000 colonies E. coli; 10,000 strep agalactiase, pansensitive ?4/1- transitioned to keflex po ? ?Assessment and Plan: ?Tracey Hall is a 41 year old woman presenting with abdominal discomfort, nausea, poor p.o. intake, suspected UTI in setting of neurogenic bladder.  Past medical history is significant for spinal cord injury with quadriplegia secondary to [place], MDD, IDA, gastric/duodenal ulcers with history of GI bleed, neurogenic bladder. ? 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 Suspect it is made worse by constipation. Encouraged to drink as much as possible, could give IV bolus, but will need IV replaced and difficult stick. If hypotension not better, then will place order for IV team ?- Drink PO, re-evaluate for IV ?- Continue midodrine 3 times daily ?- Continue PT OT eval and treat ? ?Constipation: Chronic, worsened ?Last BM 2 days ago. Patient has difficulty with BM due to CSI. Soap sud enema done yesterday without result. Patient has distended abdomen today, will get KUB. Ddx includes SBO, however abdomen soft to touch. May need disimpaction if KUB shows very large stool burden ?- KUB ?- Monitor for BM/flatus ?- AM BMP ?- Rectal stimulation for BM daily ? ?Pyelonephritis  Neurogenic bladder: improved ?Patient with acute cystitis due to E. coli. S/p CTX 3/29-3/31. Started Keflex p.o. yesterday. VSS. Has had improvement in pain and p.o. intake.  Safe to discharge home today. ?-Continue Keflex for total of 7 days, end date  4/4 ? ?Acute on chronic iron deficiency anemia ?Status post 2 units packed red blood cells this admission.  Hemoglobin dropped from 8-7 yesterday.  Suspect likely hemodilution from IVF.  Patient's baseline hemoglobin is 7-8.  Hemoglobin today is 7.9 and stable.  ?- Repeat CBC tomorrow if stays overnight ? ?Chronic wound of left hip: chronic, stable ?Wound care consulted. ?- Dressing changes daily: clean wound with saline, apply Medihoney and cover with gauze/abdominal pads ? ?Other conditions chronic and stable.  Home medications resumed: MDD, C-spine injury/quadriplegia ? ?FEN/GI: regular diet ?PPx: SCDs given anemia ?Dispo:Pending PT recommendations  today. Barriers include ensuring safe discharge.  ? ?Subjective:  ?Patient reports no BM after enema last night. She has had worsening dizziness and hypotension, has to lay completely flat for relief. She also has  ? ?Objective: ?Temp:  [97.9 ?F (36.6 ?C)-98.5 ?F (36.9 ?C)] 98 ?F (36.7 ?C) (04/02 0436) ?Pulse Rate:  [53-67] 62 (04/02 0436) ?Resp:  [16-20] 18 (04/02 0436) ?BP: (102-136)/(50-85) 120/62 (04/02 0436) ?SpO2:  [97 %-100 %] 100 % (04/01 2336) ?Physical Exam: ?General: age-appropriate AAW, resting in bed, NAD, class I obesity ?Cardiovascular: RRR, no MRG, 2+ radial pulses ?Respiratory: CTAB, no iWOB, no w/r/r ?Abdomen: soft, distended, no fluid wave, decreased BS, non-tender ?Extremities: claw hand position, atrophied b/l LE's, no peripheral edema ? ?Laboratory: ?Recent Labs  ?Lab 09/19/21 ?0134 09/20/21 ?0119 09/21/21 ?0124  ?WBC 11.3* 10.1 9.7  ?HGB 8.0* 7.0* 7.9*  ?HCT 27.8* 24.9* 28.0*  ?PLT 538* 456* 566*  ? ?Recent Labs  ?Lab 09/17/21 ?1405 09/17/21 ?1424 09/19/21 ?0134 09/20/21 ?0119 09/21/21 ?0124  ?NA 139   < > 136 135 137  ?K 3.5   < >  4.0 3.5 4.2  ?CL 107   < > 102 105 103  ?CO2 25  --  '25 25 27  '$ ?BUN 6   < > <5* 6 7  ?CREATININE 0.41*   < > 0.51 0.39* 0.55  ?CALCIUM 8.8*  --  8.8* 8.5* 9.0  ?PROT 6.8  --   --   --   --   ?BILITOT 0.5  --   --   --    --   ?ALKPHOS 79  --   --   --   --   ?ALT 12  --   --   --   --   ?AST 13*  --   --   --   --   ?GLUCOSE 96   < > 79 127* 95  ? < > = values in this interval not displayed.  ? ?Imaging/Diagnostic Tests: ?No results found. ? ?Gladys Damme, MD ?09/21/2021, 6:56 AM ?PGY-3, [place] ?[place] Intern pager: [phone], text pages welcome ? ?

## 2021-09-21 NOTE — Progress Notes (Signed)
FPTS Brief Progress Note ? ?S: Patient asleep ? ? ?O: ?BP (!) 102/50 (BP Location: Right Arm)   Pulse (!) 53   Temp 98 ?F (36.7 ?C) (Oral)   Resp 19   Ht '5\' 5"'$  (1.651 m)   Wt 95 kg   SpO2 100%   BMI 34.85 kg/m?   ? ? ?A/P: ?Patient asleep at this time. See progress notes for more information. No change to plan. ? ?Gladys Damme, MD ?09/21/2021, 2:27 AM ?PGY-3, [place] [facility] Night Resident  ?Please page [phone] with questions.  ? ? ?

## 2021-09-21 NOTE — Plan of Care (Signed)
Pt having continued pain and stiffness.  No BM this shift, despite giving soap suds enema.  Urinary output remains good.  Pt slept well throughout shift.  ?Problem: Pain Managment: ?Goal: General experience of comfort will improve ?Outcome: Progressing ?  ?Problem: Elimination: ?Goal: Will not experience complications related to bowel motility ?Outcome: Progressing ?  ?

## 2021-09-22 DIAGNOSIS — Z87828 Personal history of other (healed) physical injury and trauma: Secondary | ICD-10-CM | POA: Diagnosis not present

## 2021-09-22 DIAGNOSIS — I959 Hypotension, unspecified: Secondary | ICD-10-CM

## 2021-09-22 DIAGNOSIS — M792 Neuralgia and neuritis, unspecified: Secondary | ICD-10-CM | POA: Diagnosis not present

## 2021-09-22 DIAGNOSIS — Z993 Dependence on wheelchair: Secondary | ICD-10-CM

## 2021-09-22 DIAGNOSIS — N12 Tubulo-interstitial nephritis, not specified as acute or chronic: Secondary | ICD-10-CM | POA: Diagnosis not present

## 2021-09-22 LAB — BASIC METABOLIC PANEL
Anion gap: 7 (ref 5–15)
BUN: 8 mg/dL (ref 6–20)
CO2: 28 mmol/L (ref 22–32)
Calcium: 9.2 mg/dL (ref 8.9–10.3)
Chloride: 102 mmol/L (ref 98–111)
Creatinine, Ser: 0.44 mg/dL (ref 0.44–1.00)
GFR, Estimated: >60 mL/min (ref >60)
Glucose, Bld: 106 mg/dL — ABNORMAL HIGH (ref 70–99)
Potassium: 3.7 mmol/L (ref 3.5–5.1)
Sodium: 137 mmol/L (ref 135–145)

## 2021-09-22 LAB — CBC
HCT: 27.2 % — ABNORMAL LOW (ref 36.0–46.0)
Hemoglobin: 7.8 g/dL — ABNORMAL LOW (ref 12.0–15.0)
MCH: 19.3 pg — ABNORMAL LOW (ref 26.0–34.0)
MCHC: 28.7 g/dL — ABNORMAL LOW (ref 30.0–36.0)
MCV: 67.2 fL — ABNORMAL LOW (ref 80.0–100.0)
Platelets: 589 10*3/uL — ABNORMAL HIGH (ref 150–400)
RBC: 4.05 MIL/uL (ref 3.87–5.11)
RDW: 25.8 % — ABNORMAL HIGH (ref 11.5–15.5)
WBC: 7.7 10*3/uL (ref 4.0–10.5)
nRBC: 0 % (ref 0.0–0.2)

## 2021-09-22 NOTE — TOC Initial Note (Signed)
Transition of Care (TOC) - Initial/Assessment Note  ? ? ?Patient Details  ?Name: Tracey Hall ?MRN: [id number] ?Date of Birth: 02/14/1981 ? ?Transition of Care (TOC) CM/SW Contact:    ?Cyndi Bender, RN ?Phone Number: ?09/22/2021, 2:07 PM ? ?Clinical Narrative:                 ?Spoke to patient regarding transition needs. PT recommends Home health but patient declines due to work schedule. Patient is agreeable to go to outpt PT/OT if they can do apt after 5pm. Referral sent to [facility].  ?DME order for hoyer lift and dressing supplies. Spoke to [place] with [employer] and these items will be delivered to the house.  ?Patient has a car with wheelchair  assessable. ? ?Expected Discharge Plan: OP Rehab ?Barriers to Discharge: Continued Medical Work up ? ? ?Patient Goals and CMS Choice ?Patient states their goals for this hospitalization and ongoing recovery are:: return home ?CMS Medicare.gov Compare Post Acute Care list provided to:: Patient ?Choice offered to / list presented to : Patient ? ?Expected Discharge Plan and Services ?Expected Discharge Plan: OP Rehab ?  ?Discharge Planning Services: CM Consult ?  ?Living arrangements for the past 2 months: [employer] ?Expected Discharge Date: 09/20/21               ?DME Arranged: Other see comment (dressing supplies) ?DME Agency: [employer] ?Date DME Agency Contacted: 09/22/21 ?Time DME Agency Contacted: [phone] ?Representative spoke with at DME Agency: Freda Munro ?  ?  ?  ?  ?  ? ?Prior Living Arrangements/Services ?Living arrangements for the past 2 months: [place] ?Lives with:: Significant Other ?Patient language and need for interpreter reviewed:: Yes ?       ?Need for Family Participation in Patient Care: Yes (Comment) ?Care giver support system in place?: Yes (comment) ?Current home services: DME (wheelchair) ?Criminal Activity/Legal Involvement Pertinent to Current Situation/Hospitalization: No - Comment as needed ? ?Activities of Daily  Living ?Home Assistive Devices/Equipment: Wheelchair ?ADL Screening (condition at time of admission) ?Patient's cognitive ability adequate to safely complete daily activities?: Yes ?Is the patient deaf or have difficulty hearing?: No ?Does the patient have difficulty seeing, even when wearing glasses/contacts?: No ?Does the patient have difficulty concentrating, remembering, or making decisions?: No ?Patient able to express need for assistance with ADLs?: Yes ?Does the patient have difficulty dressing or bathing?: Yes ?Independently performs ADLs?: No ?Communication: Independent ?Dressing (OT): Needs assistance ?Is this a change from baseline?: Pre-admission baseline ?Grooming: Needs assistance ?Is this a change from baseline?: Pre-admission baseline ?Feeding: Independent ?Bathing: Needs assistance ?Is this a change from baseline?: Pre-admission baseline ?Toileting: Needs assistance ?Is this a change from baseline?: Pre-admission baseline ?In/Out Bed: Needs assistance ?Is this a change from baseline?: Pre-admission baseline ?Walks in Home: Dependent ?Is this a change from baseline?: Pre-admission baseline ?Does the patient have difficulty walking or climbing stairs?: Yes ?Weakness of Legs: Both ?Weakness of Arms/Hands: Both ? ?Permission Sought/Granted ?Permission sought to share information with : Case Manager ?Permission granted to share information with : Yes, Verbal Permission Granted ?   ? Permission granted to share info w AGENCY: outpt rehab ?   ?   ? ?Emotional Assessment ?Appearance:: Appears stated age ?Attitude/Demeanor/Rapport: Engaged ?Affect (typically observed): Accepting ?Orientation: : Oriented to Self, Oriented to Place, Oriented to Situation ?Alcohol / Substance Use: Not Applicable ?Psych Involvement: No (comment) ? ?Admission diagnosis:  Pyelonephritis [N12] ?History of spinal cord injury [Z87.828] ?Symptomatic anemia [D64.9] ?Patient  Active Problem List  ? Diagnosis Date Noted  ? Pressure injury  of skin 09/18/2021  ? Pyelonephritis 09/17/2021  ? 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 UTI (urinary tract infection) 12/26/2020  ? Severe sepsis ([place]) 12/26/2020  ? Chronic anemia 12/26/2020  ? Chronic indwelling Foley catheter 12/26/2020  ? Abdominal pain, chronic, right lower quadrant 11/27/2020  ? Nerve pain 11/27/2020  ? Acute cystitis 10/12/2020  ? Nausea 10/12/2020  ? Abdominal pain 10/12/2020  ? Dehydration 10/12/2020  ? Hypotension 10/12/2020  ? Spasticity 05/13/2020  ? 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 Sepsis secondary to UTI ([place]) 04/11/2019  ? Malaise and fatigue   ? 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 Anemia of chronic disease   ? Muscle spasm of both lower legs   ? Neurogenic bowel 02/26/2019  ? Debility 02/23/2019  ? Sepsis ([place]) 02/18/2019  ? Chronic pain syndrome 10/14/2018  ? Complication of feeding tube ([place])   ? Intubation of airway performed without difficulty   ? Tachypnea   ? Peptic ulcer disease 08/03/2017  ? E coli bacteremia 08/03/2017  ? Sepsis due to urinary tract infection ([place]) 07/31/2017  ? Bullous dermatitis 10/13/2016  ? AKI (acute kidney injury) ([place]) 09/16/2016  ? Hypokalemia 09/16/2016  ? Obesity 02/19/2012  ? Recurrent UTI 09/15/2010  ? Asthma 09/15/2010  ? Fever 08/21/2010  ? Anxiety state 01/10/2010  ? Neurogenic bladder 01/04/2007  ? Acute on chronic anemia 12/06/2006  ? Major depressive disorder, recurrent episode ([place]) 08/19/2006  ? Quadriplegia ([place]) 08/19/2006  ? ?PCP:  Dickie La, MD ?Pharmacy:   ?[id number] [place], [place] - [address] AT [id number]?Phone: [phone]: [phone]? ?[place]?Phone: [phone]: [phone]? ? ? ? ?Social Determinants of Health (SDOH) Interventions ?  ? ?Readmission Risk Interventions ?   ? View :  No data to display.  ?  ?  ?  ? ? ? ?

## 2021-09-22 NOTE — Progress Notes (Signed)
?   09/21/21 2002 09/21/21 2025  ?Assess: MEWS Score  ?Temp 98.6 ?F (37 ?C) 98.6 ?F (37 ?C)  ?BP (!) 72/40 94/67  ?Pulse Rate 82 66  ?Resp 20 20  ?Level of Consciousness  --  Alert  ?SpO2 100 % 95 %  ?O2 Device  --  Room Air  ?Assess: MEWS Score  ?MEWS Temp 0 0  ?MEWS Systolic 2 1  ?MEWS Pulse 0 0  ?MEWS RR 0 0  ?MEWS LOC 0 0  ?MEWS Score 2 1  ?MEWS Score Color Yellow Green  ?Assess: if the MEWS score is Yellow or Red  ?Were vital signs taken at a resting state? Yes  --   ?Focused Assessment No change from prior assessment  --   ?Early Detection of Sepsis Score *See Row Information* Low  --   ?MEWS guidelines implemented *See Row Information* No, vital signs rechecked  --   ?Escalate  ?MEWS: Escalate Yellow: discuss with charge nurse/RN and consider discussing with provider and RRT  --   ?Notify: Charge Nurse/RN  ?Name of Charge Nurse/RN Notified Lexine Baton  --   ?Date Charge Nurse/RN Notified 09/21/21  --   ?Time Charge Nurse/RN Notified 2300  --   ?Notify: Provider  ?Provider Name/Title Sowell MD  --   ?Date Provider Notified 09/21/21  --   ?Time Provider Notified 2044  --   ?Notification Type Page  --   ?Notification Reason Other (Comment) ?(mews for BP recheck on back improved.)  --   ?Provider response No new orders  --   ?Date of Provider Response 09/21/21  --   ?Time of Provider Response 2049  --   ?Document  ?Patient Outcome Stabilized after interventions  --   ?Progress note created (see row info) Yes  --   ? ? ?

## 2021-09-22 NOTE — Progress Notes (Signed)
Occupational Therapy Treatment ?Patient Details ?Name: Tracey Hall ?MRN: [id number] ?DOB: September 13, 1980 ?Today's Date: 09/22/2021 ? ? ?History of present illness Pt is a 41 y/o female presenting on 3/29 with abdominal discomfort, nausea, and poor PO intake. Found with UTI.  PMH inculdes: SCI with quadriplegia, MDD, IDA, GI bleed. ?  ?OT comments ? Patient supine in bed and agreeable to OT session, significant other at side. Requires total +2 to roll back to supine and scoot up to [facility].  Engaged in progressive HOB elevation, BP assessed at [facility] flat, HOB elevated at 15* and 30*.  Patient with stable BP at flat and 15* 120s/60-70, reports symptoms increased of dizziness at 30* and BP dropped to 97/59.  Therapist lowered HOB and BP continued to drop to 75/32, placed in trendelenburg position and BP recovered to 106/76 then 114/74.  Pt continued to feel "off", responding with 1 word answers. RN aware and present in room upon exit.  Will follow acutely, updated dc plan to [facility] as CIR is not an option.    ? ?Recommendations for follow up therapy are one component of a multi-disciplinary discharge planning process, led by the attending physician.  Recommendations may be updated based on patient status, additional functional criteria and insurance authorization. ?   ?Follow Up Recommendations ? Home health OT  ?  ?Assistance Recommended at Discharge Frequent or constant Supervision/Assistance  ?Patient can return home with the following ? Two people to help with walking and/or transfers;Two people to help with bathing/dressing/bathroom;Assistance with cooking/housework;Assist for transportation;Help with stairs or ramp for entrance ?  ?Equipment Recommendations ? Hospital bed;Other (comment) (hoyer lift)  ?  ?Recommendations for Other Services   ? ?  ?Precautions / Restrictions Precautions ?Precautions: Fall;Other (comment) ?Precaution Comments: +orthostatics ?Restrictions ?Weight Bearing Restrictions: No  ? ? ?  ? ?Mobility Bed  Mobility ?Overal bed mobility: Needs Assistance ?Bed Mobility: Rolling ?Rolling: Total assist, +2 for physical assistance, +2 for safety/equipment ?  ?  ?  ?  ?General bed mobility comments: on L side upon entry, rolling and repositioning up to [facility] with total assist +2. ?  ? ?Transfers ?  ?  ?  ?  ?  ?  ?  ?  ?  ?  ?  ?  ?Balance   ?  ?  ?  ?  ?  ?  ?  ?  ?  ?  ?  ?  ?  ?  ?  ?  ?  ?  ?   ? ?ADL either performed or assessed with clinical judgement  ? ?ADL   ?  ?  ?  ?  ?  ?  ?  ?  ?  ?  ?  ?  ?  ?  ?  ?  ?  ?  ?  ?  ?  ? ?Extremity/Trunk Assessment   ?  ?  ?  ?  ?  ? ?Vision   ?  ?  ?Perception   ?  ?Praxis   ?  ? ?Cognition Arousal/Alertness: Awake/alert, Lethargic ?Behavior During Therapy: Flat affect ?Overall Cognitive Status: Within Functional Limits for tasks assessed ?  ?  ?  ?  ?  ?  ?  ?  ?  ?  ?  ?  ?  ?  ?  ?  ?General Comments: pt becoming lethargic with hypotension, remains lethargic at end of session with RN present ?  ?  ?   ?Exercises   ? ?  ?  Shoulder Instructions   ? ? ?  ?General Comments BP assessed in supine, 15* HOB elevation stable and orthostatic at 30* HOB elevation, continued to drop and required trendelenburg positioining.  Pt continues to remain with lightheaded feeling, reports feeling like she is going in to AD (autonomic dysfunction).  RN aware and in room upon exit.  ? ? ?Pertinent Vitals/ Pain       Pain Assessment ?Pain Assessment: Faces ?Faces Pain Scale: Hurts even more ?Pain Location: generalized ?Pain Descriptors / Indicators: Discomfort, Spasm ?Pain Intervention(s): Monitored during session, Repositioned (RN aware) ? ?Home Living   ?  ?  ?  ?  ?  ?  ?  ?  ?  ?  ?  ?  ?  ?  ?  ?  ?  ?  ? ?  ?Prior Functioning/Environment    ?  ?  ?  ?   ? ?Frequency ? Min 2X/week  ? ? ? ? ?  ?Progress Toward Goals ? ?OT Goals(current goals can now be found in the care plan section) ? Progress towards OT goals: Progressing toward goals ? ?Acute Rehab OT Goals ?Patient Stated Goal: feel better ?OT  Goal Formulation: With patient ?Time For Goal Achievement: 10/02/21 ?Potential to Achieve Goals: Good  ?Plan Frequency remains appropriate;Discharge plan needs to be updated   ? ?Co-evaluation ? ? ?   ?  ?  ?  ?  ? ?  ?AM-PAC OT "6 Clicks" Daily Activity     ?Outcome Measure ? ? Help from another person eating meals?: A Little ?Help from another person taking care of personal grooming?: A Little ?Help from another person toileting, which includes using toliet, bedpan, or urinal?: Total ?Help from another person bathing (including washing, rinsing, drying)?: Total ?Help from another person to put on and taking off regular upper body clothing?: Total ?Help from another person to put on and taking off regular lower body clothing?: Total ?6 Click Score: 10 ? ?  ?End of Session   ? ?OT Visit Diagnosis: Other symptoms and signs involving the nervous system (R29.898);Pain;Muscle weakness (generalized) (M62.81) ?Pain - part of body:  (generalized) ?  ?Activity Tolerance Treatment limited secondary to medical complications (Comment) ?  ?Patient Left in bed;with call bell/phone within reach;with family/visitor present;with nursing/sitter in room ?  ?Nurse Communication Mobility status ?  ? ?   ? ?Time: 1660-6004 ?OT Time Calculation (min): 25 min ? ?Charges: OT General Charges ?$OT Visit: 1 Visit ?OT Treatments ?$Self Care/Home Management : 8-22 mins ?$Therapeutic Activity: 8-22 mins ? ?Jolaine Artist, OT ?Acute Rehabilitation Services ?Pager [phone] ?Office [phone] ? ? ?Delight Stare ?09/22/2021, 11:29 AM ?

## 2021-09-22 NOTE — Progress Notes (Signed)
Provider called requested manual BP due to BP low. Manual obtained.  ? 09/22/21 0005 09/22/21 0150  ?Assess: MEWS Score  ?Temp 98.4 ?F (36.9 ?C)  --   ?BP (!) 90/47 96/66  ?Pulse Rate (!) 54 60  ?Resp 19 17  ?Level of Consciousness  --  Alert  ?SpO2 96 % 98 %  ?Assess: MEWS Score  ?MEWS Temp 0 0  ?MEWS Systolic 1 1  ?MEWS Pulse 0 0  ?MEWS RR 0 0  ?MEWS LOC 0 0  ?MEWS Score 1 1  ?MEWS Score Color Tracey Hall  ? ? ?

## 2021-09-22 NOTE — Care Management Important Message (Signed)
Important Message ? ?Patient Details  ?Name: Tracey Hall ?MRN: [id number] ?Date of Birth: 12/25/1980 ? ? ?Medicare Important Message Given:  Yes ? ? ? ? ?Byard Carranza ?09/22/2021, 2:52 PM ?

## 2021-09-22 NOTE — Progress Notes (Signed)
Family Medicine Teaching Service ?Daily Progress Note ?Intern Pager: [phone] ? ?Patient name: Tracey Hall Medical record number: [id number] ?Date of birth: [birth year] Age: 41 y.o. Gender: female ? ?Primary Care Provider: Camillia Herter, NP ?Consultants: None ?Code Status: Full ? ?Pt Overview and Major Events to Date:  ?3/29-admitted: Ceftriaxone started ?3/30-2 units PRBC transfused ?3/31-urine culture greater than 100,000 colonies E. coli; 10,000 strep agalactiase, pansensitive ?4/1- transitioned to keflex po ? ?Assessment and Plan: ?Tracey Hall is a 41 year old woman presenting with abdominal discomfort, nausea, poor p.o. intake, suspected UTI in setting of neurogenic bladder.  Past medical history is significant for spinal cord injury with quadriplegia secondary to [place], MDD, IDA, gastric/duodenal ulcers with history of GI bleed, neurogenic bladder. ? ?Pyelonephritis ?Patient with acute cystitis due to E. Coli. Completed 3 day CTX. On keflex. ?-Continue keflex (complete 4/4) ? ?Acute on chronic iron deficiency anemia ?Status post 2 units packed red blood cells this admission. Hgb .  Suspect likely hemodilution from IVF.  Patient's baseline hemoglobin is 7-8.  Hemoglobin today is 7.9>7.8.  Received 2nd dose of IV iron ?-2nd dose of IV iron today ? 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 Added Linz[place]. Told she would require regular rectal stimulation for BM daily as well as soap suds enema.  Also is taking as needed oxycodone IR regularly which is likely contributing to constipation. ?-Minimize opiate use ? ?Neurogenic bladder ?Chronic indwelling foley catheter. On antispasmodic to prevent Foley displacement. ?-Outpatient urology ?-Continue Antispasmodic ? ?Autonomic dysfunction ?Started on midodrine this hospitalization but refused due to fear that it was elevating her blood pressure too much.  Encourage patient to take midodrine as her blood pressures have been soft since not taking  it. ?-Continue midodrine 5 mg tid ? ?Pressure injury, chronic ?Wound care consulted. ?- Dressing changes daily: clean wound with saline, apply Medihoney and cover with gauze/abdominal pads ? ?FEN/GI: regular diet ?PPx: SCDs  ?Dispo:Home today or tomorrow. Barriers include IV iron.  ? ?Subjective:  ?Patient seen and assessed at bedside.  Patient reports no acute complaints at this time.  Patient continues to complain of abdominal distention, generalized pain.  Denies having any bowel movements since molasses enema.  Patient has been refusing midodrine but this has been having soft blood pressure. Encouraged patient to take midodrine. All questions addressed.  ? ?Objective: ?Temp:  [98.4 ?F (36.9 ?C)-99 ?F (37.2 ?C)] 98.6 ?F (37 ?C) (04/03 7353) ?Pulse Rate:  [54-82] 77 (04/03 0332) ?Resp:  [16-20] 20 (04/03 0332) ?BP: (72-162)/(40-94) 98/55 (04/03 0332) ?SpO2:  [95 %-100 %] 97 % (04/03 0332) ?Physical Exam: ?General: Regular, NAD ?Cardiovascular: RRR ?Respiratory: CTAB ?Abdomen: Mildly distended, bowel sounds heard in all quadrants ?Extremities: Quadriplegia but able to move upper extremities ? ?Laboratory: ?Recent Labs  ?Lab 09/20/21 ?0119 09/21/21 ?0124 09/22/21 ?2992  ?WBC 10.1 9.7 7.7  ?HGB 7.0* 7.9* 7.8*  ?HCT 24.9* 28.0* 27.2*  ?PLT 456* 566* 589*  ? ?Recent Labs  ?Lab 09/17/21 ?1405 09/17/21 ?1424 09/20/21 ?0119 09/21/21 ?0124 09/22/21 ?4268  ?NA 139   < > 135 137 137  ?K 3.5   < > 3.5 4.2 3.7  ?CL 107   < > 105 103 102  ?CO2 25   < > '25 27 28  '$ ?BUN 6   < > '6 7 8  '$ ?CREATININE 0.41*   < > 0.39* 0.55 0.44  ?CALCIUM 8.8*   < > 8.5* 9.0 9.2  ?PROT 6.8  --   --   --   --   ?  BILITOT 0.5  --   --   --   --   ?ALKPHOS 79  --   --   --   --   ?ALT 12  --   --   --   --   ?AST 13*  --   --   --   --   ?GLUCOSE 96   < > 127* 95 106*  ? < > = values in this interval not displayed.  ? ? ? ? ?Imaging/Diagnostic Tests: ?No results found. ? ?France Ravens, MD ?09/22/2021, 6:37 AM ?PGY-1, [place] [facility] ?[place] Intern  pager: [phone], text pages welcome ?

## 2021-09-22 NOTE — Progress Notes (Signed)
FPTS Night Rounding Progress Note ? ?S:Patient asleep, did not disturb patient.  ? ?O: ?BP (!) 90/47 (BP Location: Right Arm)   Pulse (!) 54   Temp 98.4 ?F (36.9 ?C) (Oral)   Resp 19   Ht '5\' 5"'$  (1.651 m)   Wt 95 kg   SpO2 (!) 9%   BMI 34.85 kg/m?   ?General: Resting comfortably, in no acute distress. ?Resp: normal work of breathing ? ?A/P: ?No changes to plan, plan per day team.  ? Donney Dice, DO ?09/22/2021, 12:41 AM ?PGY-2, [place] ?Service pager [phone]  ?

## 2021-09-22 NOTE — Plan of Care (Signed)
Pt alert and oriented x 4. Activity limited due to quadriplegic. Pt has been on midodrine due to hypotension. Pt refused due to limit sitting Pt feels it makes her BP high in 160. Then she sits up and it causes severe bp drop. Which she is unable to tolerate large drop per pt. Attempted to get pt to take midodrine pt continued to refuse. Pt hypotensive on left side. Flagged yellow mews. Pt placed on back and bp increased to 62'G Systolic. On call MD aware. Manual BP obtained after midnight per request. BP was 96/66. Pt has not had bm since molasses enema.  ?Problem: Activity: ?Goal: Risk for activity intolerance will decrease ?Outcome: Not Progressing ?  ?Problem: Nutrition: ?Goal: Adequate nutrition will be maintained ?Outcome: Not Progressing ?  ?Problem: Elimination: ?Goal: Will not experience complications related to bowel motility ?Outcome: Not Progressing ?  ?Problem: Elimination: ?Goal: Will not experience complications related to bowel motility ?Outcome: Not Progressing ?  ?Problem: Education: ?Goal: Knowledge of General Education information will improve ?Description: Including pain rating scale, medication(s)/side effects and non-pharmacologic comfort measures ?Outcome: Progressing ?  ?Problem: Health Behavior/Discharge Planning: ?Goal: Ability to manage health-related needs will improve ?Outcome: Progressing ?  ?Problem: Clinical Measurements: ?Goal: Ability to maintain clinical measurements within normal limits will improve ?Outcome: Progressing ?Goal: Will remain free from infection ?Outcome: Progressing ?Goal: Diagnostic test results will improve ?Outcome: Progressing ?Goal: Respiratory complications will improve ?Outcome: Progressing ?Goal: Cardiovascular complication will be avoided ?Outcome: Progressing ?  ?Problem: Coping: ?Goal: Level of anxiety will decrease ?Outcome: Progressing ?  ?Problem: Elimination: ?Goal: Will not experience complications related to urinary retention ?Outcome:  Progressing ?  ?Problem: Pain Managment: ?Goal: General experience of comfort will improve ?Outcome: Progressing ?  ?Problem: Safety: ?Goal: Ability to remain free from injury will improve ?Outcome: Progressing ?  ?

## 2021-09-23 ENCOUNTER — Other Ambulatory Visit: Payer: Self-pay | Admitting: Family Medicine

## 2021-09-23 LAB — CBC
HCT: 25.6 % — ABNORMAL LOW (ref 36.0–46.0)
Hemoglobin: 7.2 g/dL — ABNORMAL LOW (ref 12.0–15.0)
MCH: 19.4 pg — ABNORMAL LOW (ref 26.0–34.0)
MCHC: 28.1 g/dL — ABNORMAL LOW (ref 30.0–36.0)
MCV: 68.8 fL — ABNORMAL LOW (ref 80.0–100.0)
Platelets: 519 10*3/uL — ABNORMAL HIGH (ref 150–400)
RBC: 3.72 MIL/uL — ABNORMAL LOW (ref 3.87–5.11)
RDW: 26.4 % — ABNORMAL HIGH (ref 11.5–15.5)
WBC: 8.5 10*3/uL (ref 4.0–10.5)
nRBC: 0 % (ref 0.0–0.2)

## 2021-09-23 MED ORDER — LIDOCAINE HCL URETHRAL/MUCOSAL 2 % EX GEL: 1.0000 "application " | CUTANEOUS | 0 refills | Status: DC
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
## 2021-09-23 NOTE — Progress Notes (Signed)
Family Medicine Teaching Service ?Daily Progress Note ?Intern Pager: [phone] ? ?Patient name: Tracey Hall Medical record number: [id number] ?Date of birth: [birth year] Age: 41 y.o. Gender: female ? ?Primary Care Provider: No primary care provider on file. ?Consultants: None ?Code Status: Full ? ?Pt Overview and Major Events to Date:  ?3/29-admitted: Ceftriaxone started ?3/30-2 units PRBC transfused ?3/31-urine culture greater than 100,000 colonies E. coli; 10,000 strep agalactiase, pansensitive ?4/1- transitioned to keflex po ? ?Assessment and Plan: ?Tracey Hall is a 41 year old female presenting with abdominal discomfort, nausea, poor p.o. intake, suspected UTI in the setting of neurogenic bladder.  Past medical history significant for spinal cord injury with quadriplegia secondary to [place], MDD, IDA, gastric/duodenal ulcers with history of GI bleed, neurogenic bladder ? ?Pyelonephritis ?Patient with acute cystitis due to E. coli.  Completed 3 days of ceftriaxone.  Will complete Keflex course today.  Afebrile, normal WBC ? ?Acute on chronic iron deficiency anemia ?Hemoglobin down to 7.2 from 7.8.  Has received 2 units PRBC this admission and 2 doses of IV iron. ?-A.m. CBC ? ?Abdominal pain and bloating ?Reports abdominal pain is improving and and abdomen feels less bloated.  Curb sided GI and they recommended maximizing Linzess and if necessary we can pursue rectal tube.  He also recommended minimizing constipation inducing medications including opiates.  Patient refused rectal tube. ?-Increase Linzess to 290 mg ?-Minimize opiate use ? ?Neurogenic bladder ?Chronic indwelling foley catheter. On antispasmodic to prevent Foley displacement.  Patient was also noted to have lower extremity spasms while in room which antispasmodic may be also addressing. ?-Outpatient urology ?-Continue Antispasmodic ?  ?Autonomic dysfunction ?Patient adamant that midodrine is the cause of her orthostatic hypotension reporting that "it  makes me feel like death".  Patient is concerned that her blood pressure consistently is dropping too much.  Patient insistent that she would rather tolerate her low blood pressures rather than her high ones.  Patient insistent that her blood pressure got up to the 160s and continues to perseverate over this.  Given patient's persistent refusal for midodrine, will discontinue as she will unlikely take outpatient. ?-Discontinue midodrine ? ?FEN/GI: regular diet ?PPx: SCDs  ?Dispo:Home today. Barriers includes .  ? ?Subjective:  ?Patient seen and assessed at bedside.  Had extensive conversation about midodrine and the benefits of maintaining normotensive blood pressure.  Patient refuses to believe that having her blood pressure increased via midodrine would be beneficial to her health.  Patient insists that she has been hypotensive for decades now and she has fared well.  Patient also is very insistent that we are not addressing her needs.  Patient requesting for [place] to speak with Dr. Alice Rieger her PM&R physician.  ? ?Objective: ?Temp:  [97.6 ?F (36.4 ?C)-99 ?F (37.2 ?C)] 98.8 ?F (37.1 ?C) (04/04 1517) ?Pulse Rate:  [58-80] 80 (04/04 0821) ?Resp:  [16-18] 18 (04/04 6160) ?BP: (82-142)/(48-91) 142/91 (04/04 7371) ?SpO2:  [97 %-100 %] 100 % (04/04 0643) ?Physical Exam: ?General: Resting comfortably, no acute distress.  Progressively anxious as conversation continued ?Cardiovascular: RRR ?Respiratory: CTAB ?Abdomen: Mildly distended, bowel sounds heard in all 4 quadrants ?Extremities: Quadriplegia but is able to move bilateral upper extremities.  Warm and dry ? ?Laboratory: ?Recent Labs  ?Lab 09/21/21 ?0124 09/22/21 ?0458 09/23/21 ?0337  ?WBC 9.7 7.7 8.5  ?HGB 7.9* 7.8* 7.2*  ?HCT 28.0* 27.2* 25.6*  ?PLT 566* 589* 519*  ? ?Recent Labs  ?Lab 09/17/21 ?1405 09/17/21 ?1424 09/20/21 ?0119 09/21/21 ?0124 09/22/21 ?0626  ?NA 139   < >  135 137 137  ?K 3.5   < > 3.5 4.2 3.7  ?CL 107   < > 105 103 102  ?CO2 25   < > '25 27 28  '$ ?BUN 6    < > '6 7 8  '$ ?CREATININE 0.41*   < > 0.39* 0.55 0.44  ?CALCIUM 8.8*   < > 8.5* 9.0 9.2  ?PROT 6.8  --   --   --   --   ?BILITOT 0.5  --   --   --   --   ?ALKPHOS 79  --   --   --   --   ?ALT 12  --   --   --   --   ?AST 13*  --   --   --   --   ?GLUCOSE 96   < > 127* 95 106*  ? < > = values in this interval not displayed.  ? ? ? ? ?Imaging/Diagnostic Tests: ?No results found. ? ?France Ravens, MD ?09/23/2021, 9:18 AM ?PGY-1, [place] ?[place] Intern pager: [phone], text pages welcome ?

## 2021-09-23 NOTE — Progress Notes (Signed)
Discharge instructions given to pt, verbalizes understanding of prescribed medications and follow-up care. ?

## 2021-09-23 NOTE — Progress Notes (Signed)
FPTS Night Rounding Progress Note ? ?S:Went to bedside to check on patient, she is awake and is trying to slowly get her energy back in anticipation of discharge tomorrow. She says that she needs to be able to sit upright in her chair which is why she remains sitting this late in the night.  ? ?O: ?BP (!) 87/61 (BP Location: Right Arm)   Pulse 69   Temp 98.9 ?F (37.2 ?C) (Oral)   Resp 16   Ht '5\' 5"'$  (1.651 m)   Wt 95 kg   SpO2 97%   BMI 34.85 kg/m?   ?General: Patient sitting upright in bed facetiming with granddaughter, in no acute distress. ?Resp: normal work of breathing on room air, no signs of respiratory distress ? ?A/P: ?Given autonomic dysfunction, she is on midodrine which she was agreeable to taking today. She has been sitting upright for over 2 hours so doing well. Keflex course scheduled to be completed today for pyelonephritis. Will continue to monitor vitals, including BP carefully. Requested manual reading after speaking with nurse. Continue remainder of plan per day team.  ? Donney Dice, DO ?09/23/2021, 1:01 AM ?PGY-2, [place] ?Service pager [phone]  ?

## 2021-09-23 NOTE — Progress Notes (Addendum)
Patient alert and orientated refused to have foley catheter removed.Tracey Hall "I'm supposed to have one anyway." Exited unit via electric w/c with all belongings accompanied by fiance'. ?

## 2021-09-25 ENCOUNTER — Other Ambulatory Visit: Payer: Self-pay | Admitting: Physical Medicine and Rehabilitation

## 2021-10-01 ENCOUNTER — Inpatient Hospital Stay: Payer: Medicare Other | Admitting: Family Medicine

## 2021-10-01 IMAGING — CT CT ABD-PELV W/ CM
2 of 4 series · 17 of 46 positions shown, 19 images · IV contrast (omnipaque)
Comparison: 04/06/2020

CLINICAL DATA: Abdominal pain and fever. Possible urinary tract
infection.

EXAM:
CT ABDOMEN AND PELVIS WITH CONTRAST
TECHNIQUE: Multidetector CT imaging of the abdomen and pelvis was performed
using the standard protocol following bolus administration of
intravenous contrast.
CONTRAST:  100mL OMNIPAQUE IOHEXOL 300 MG/ML  SOLN

[Series 2: axial st · axial · 0.96mm/px · z∈[-673,-238]mm · 14 of 99 slices shown, 16 images]
[im 6/99  soft-tissue]
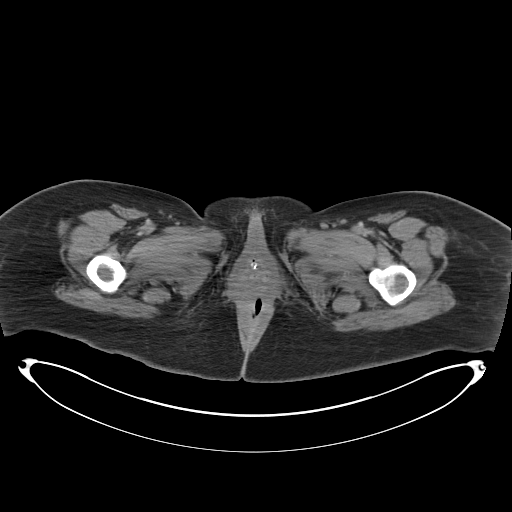
[im 6/99  bone]
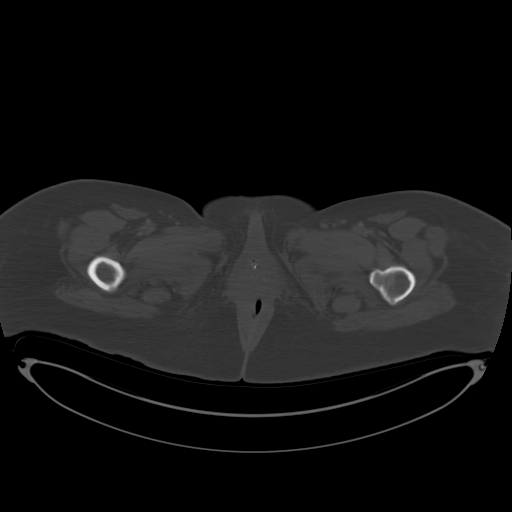
[im 11/99  soft-tissue]
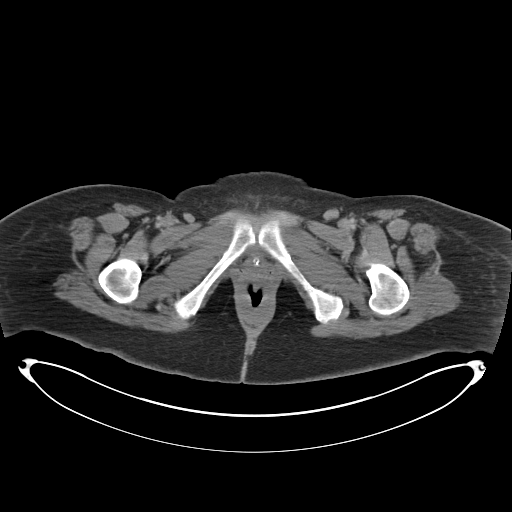
[im 21/99  soft-tissue]
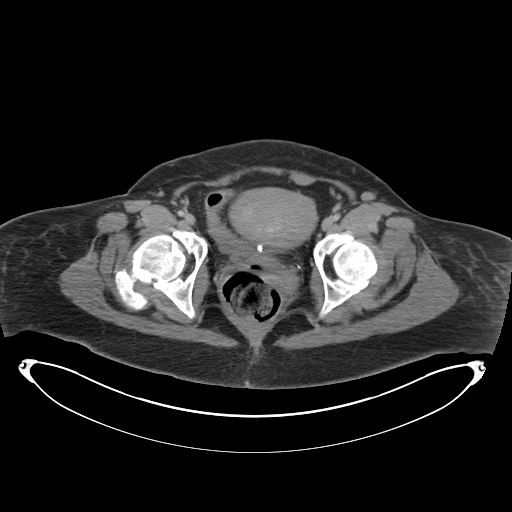
[im 26/99  soft-tissue]
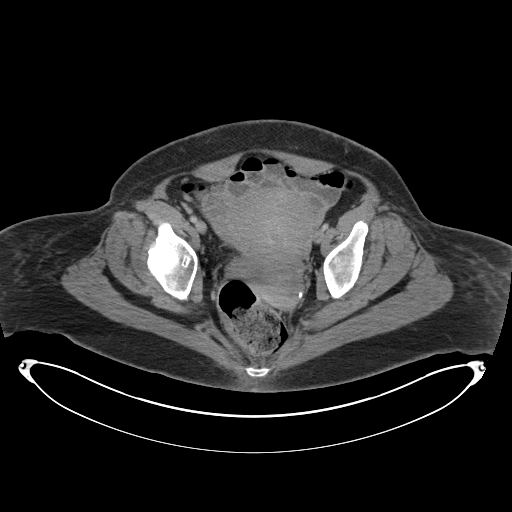
[im 31/99  soft-tissue]
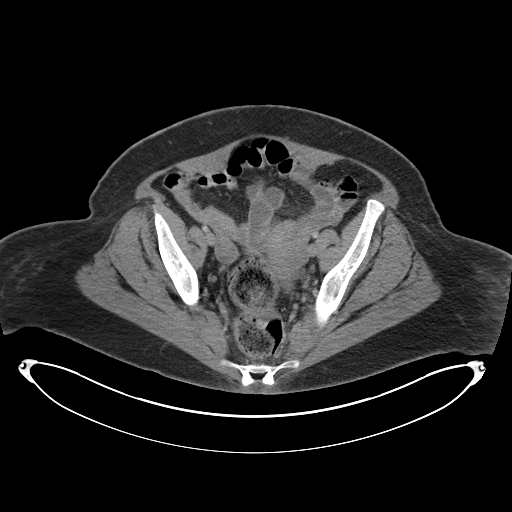
[im 42/99  soft-tissue]
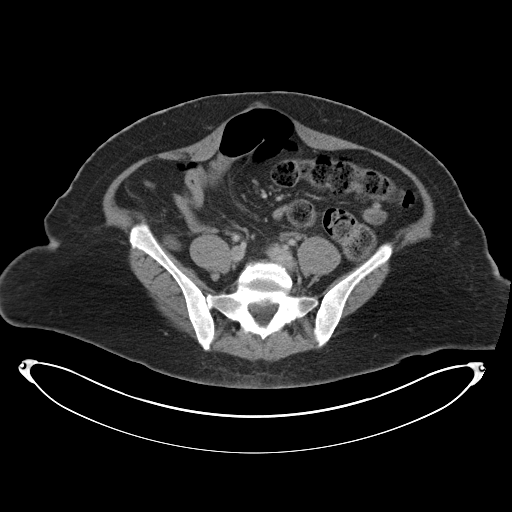
[im 47/99  soft-tissue]
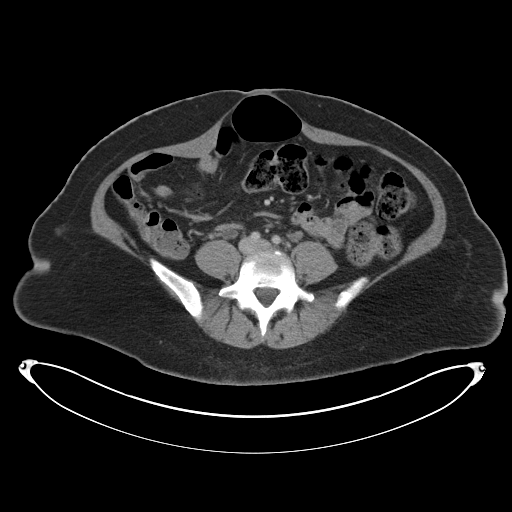
[im 52/99  soft-tissue]
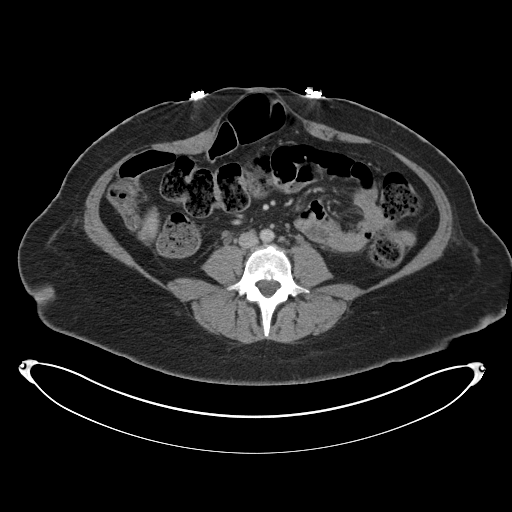
[im 57/99  soft-tissue]
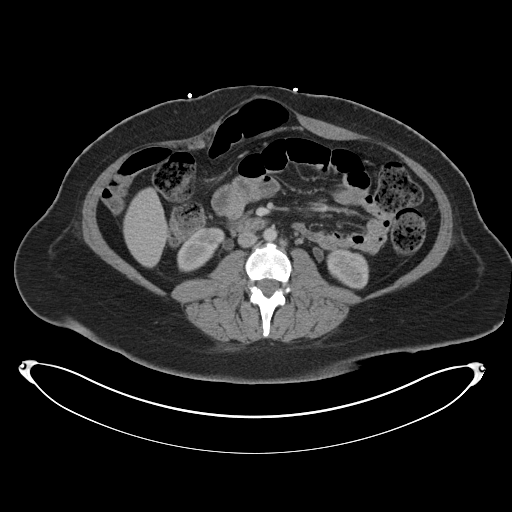
[im 57/99  bone]
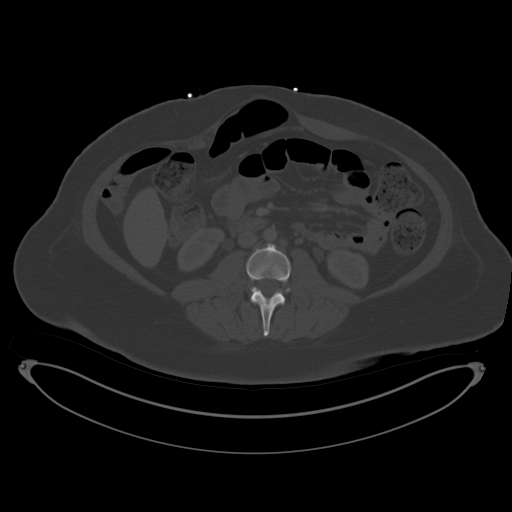
[im 68/99  soft-tissue]
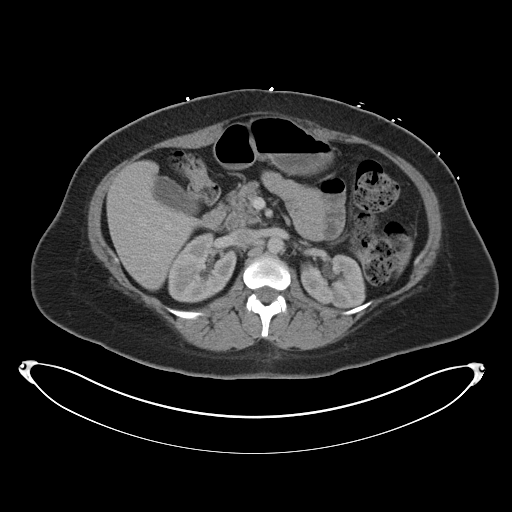
[im 73/99  soft-tissue]
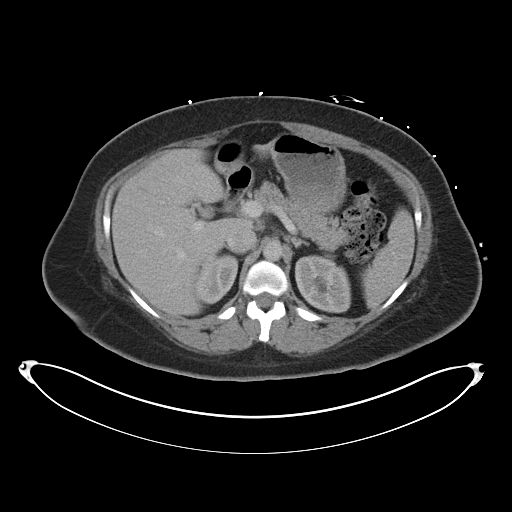
[im 78/99  soft-tissue]
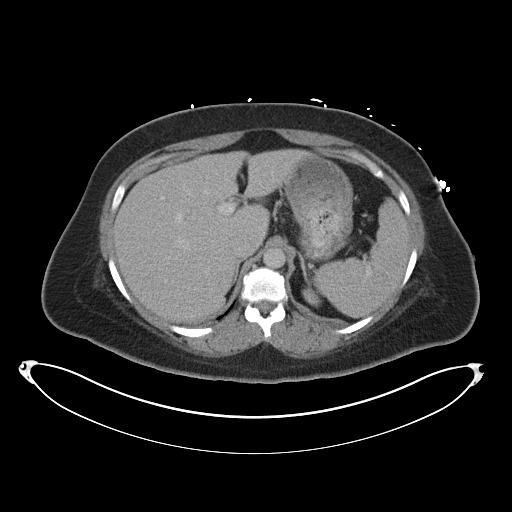
[im 88/99  soft-tissue]
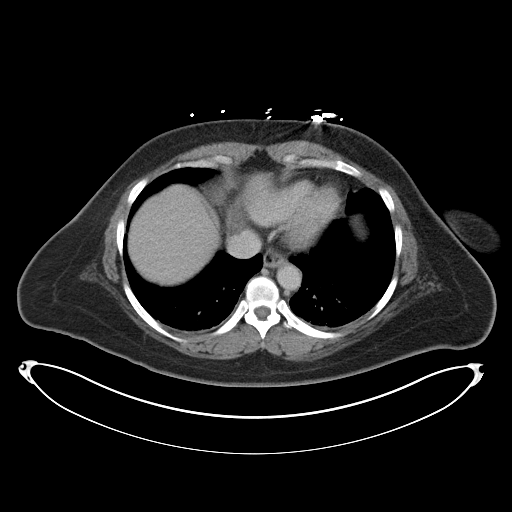
[im 93/99  soft-tissue]
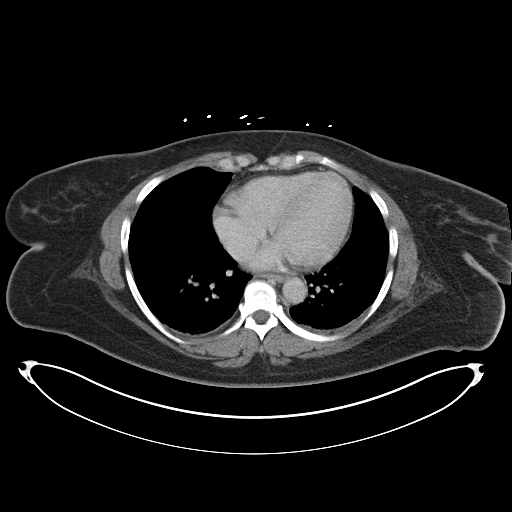

[Series 6: coronal st · coronal · 1.00mm/px · 3 of 161 slices shown]
[im 54/161  soft-tissue]
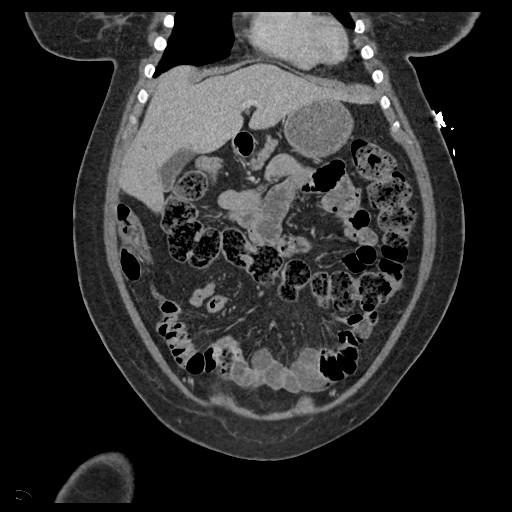
[im 72/161  soft-tissue]
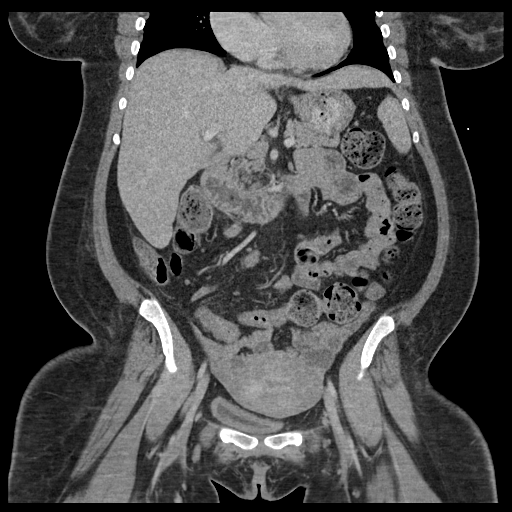
[im 89/161  soft-tissue]
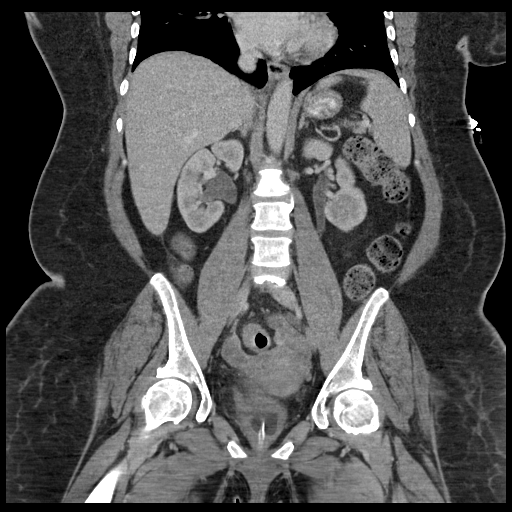

[17 of 46 positions shown; findings below may reference images not displayed]

FINDINGS: Lower chest: Dependent atelectasis in the lung bases.

Hepatobiliary: No focal liver abnormality is seen. No gallstones,
gallbladder wall thickening, or biliary dilatation.

Pancreas: Unremarkable. No pancreatic ductal dilatation or
surrounding inflammatory changes.

Spleen: Normal in size without focal abnormality.

Adrenals/Urinary Tract: No adrenal gland nodules. Kidneys are
symmetrical. No hydronephrosis or hydroureter. Bladder is
decompressed with a Foley catheter. Suggestion of diffuse bladder
wall thickening although under distention limits evaluation.

Stomach/Bowel: Stomach, small bowel, and colon are not abnormally
distended. Stool fills the colon with prominent stool in the rectum.
No inflammatory changes appreciated. Appendix is normal.

Vascular/Lymphatic: No significant vascular findings are present. No
enlarged abdominal or pelvic lymph nodes.

Reproductive: Uterus and ovaries are not significantly enlarged.
Enhancing myometrial nodules consistent with uterine fibroids.

Other: No free air or free fluid in the abdomen.

Musculoskeletal: No acute or significant osseous findings.
IMPRESSION: 1. Bladder is decompressed with a Foley catheter. Suggestion of
diffuse bladder wall thickening although under distention limits
evaluation.
2. No evidence of bowel obstruction or inflammation.
3. Uterine fibroids.

## 2021-10-12 NOTE — Progress Notes (Signed)
? ? ?  SUBJECTIVE:  ? ?CHIEF COMPLAINT / HPI: Hospital follow-up ? ?Dizziness  ?She reports that she has some dizziness when sitting up in bed ?She states that her BP usually runs low  ?She reports having to lie down last week due to feeling so dizzy  ?She reports that her BP was in 70s at that time  ?She states that she felt as if the room was spinning  ?Denies having HA at the time of the episode ?She states that she has to sit up at a very slow rate due to orthostatics   ?This normally happens in the mornings and started worsening after her discharge from the hospital  ?Patient believes this change is due to changes that were made to her medication regimen while she was hospitalized  ?She denies feeling dizzy today  ? ? ?Acute on Chronic Anemia  ?Patient reports that she normally has heavy menses but her last menses was severely heavy and had blood clots  ?Her last menses was last week around the time of her dizzy episodes ?She states that she likely "lost all of  the iron" that she received during her hospital stay due to such heavy menses recently  ?Patient reports she usually is a difficult stick when it comes to lab draws but is agreeable to having labs drawn today  ?  ?PERTINENT  PMH / PSH:  ?Hypotension,chronic  ?Asthma  ?Quadriplegia  ?Recurrent UTI  ?Acute on Chronic Anemia  ? ?OBJECTIVE:  ? ?BP 105/72   Pulse 83   SpO2 98%   ?Physical Exam ?Constitutional:   ?   General: She is not in acute distress. ?   Appearance: She is not ill-appearing or diaphoretic.  ?   Comments: In electric wheelchair   ?HENT:  ?   Nose: Nose normal.  ?Eyes:  ?   Extraocular Movements: Extraocular movements intact.  ?Cardiovascular:  ?   Rate and Rhythm: Normal rate and regular rhythm.  ?Pulmonary:  ?   Effort: Pulmonary effort is normal.  ?   Breath sounds: No wheezing or rales.  ?Abdominal:  ?   Palpations: Abdomen is soft.  ?   Tenderness: There is no abdominal tenderness.  ?Neurological:  ?   Mental Status: She is alert and  oriented to person, place, and time. Mental status is at baseline.  ?   Cranial Nerves: No cranial nerve deficit.  ?   Comments: Intermittent tremor noted  ?Patient able to move bilateral upper extremities, 3/5 strength in LUE compared to 5/5 strength in right UE  ?Does not move bilateral LE ?  ? ? ?ASSESSMENT/PLAN:  ? ?Acute on chronic anemia ?S/p iron infusion during hospitalization  ?Will recheck CBC to assess hgb today  ?F/u in 1-2 weeks  ? ?Dizziness ?Suspect episodes of dizziness while sitting upright during transition from lying supine due to orthostatics as well as suspect a component of autonomic instability given hx of spinal cord injury, could also consider medication side effects.  ?Patient emphasizes her desire to not change any further medications ?We discussed continuing to sit up slowly and monitor symptoms given her hx of lower blood pressures ?Recommend reassessing at follow up visit to see if patient would be agreeable to weaning medications (valium and trazodone) ? ?  ? ? ?Eulis Foster, MD ?[place]  ?

## 2021-10-13 ENCOUNTER — Ambulatory Visit (INDEPENDENT_AMBULATORY_CARE_PROVIDER_SITE_OTHER): Payer: Medicare Other | Admitting: Family Medicine

## 2021-10-13 ENCOUNTER — Other Ambulatory Visit: Payer: Self-pay

## 2021-10-13 VITALS — BP 105/72 | HR 83

## 2021-10-13 DIAGNOSIS — D649 Anemia, unspecified: Secondary | ICD-10-CM | POA: Diagnosis not present

## 2021-10-13 DIAGNOSIS — R42 Dizziness and giddiness: Secondary | ICD-10-CM | POA: Diagnosis not present

## 2021-10-13 NOTE — Patient Instructions (Addendum)
Today we will check your hemoglobin and iron studies to determine if you need more iron transfusions as outpatient.  ? ?Please schedule follow up with your new primary care doctor, Dr. Nori Riis.  ?

## 2021-10-14 DIAGNOSIS — R42 Dizziness and giddiness: Secondary | ICD-10-CM | POA: Insufficient documentation

## 2021-10-14 LAB — CBC
Hematocrit: 32.4 % — ABNORMAL LOW (ref 34.0–46.6)
Hemoglobin: 9.4 g/dL — ABNORMAL LOW (ref 11.1–15.9)
MCH: 21.4 pg — ABNORMAL LOW (ref 26.6–33.0)
MCHC: 29 g/dL — ABNORMAL LOW (ref 31.5–35.7)
MCV: 74 fL — ABNORMAL LOW (ref 79–97)
Platelets: 520 10*3/uL — ABNORMAL HIGH (ref 150–450)
RBC: 4.4 x10E6/uL (ref 3.77–5.28)
RDW: 27.5 % — ABNORMAL HIGH (ref 11.7–15.4)
WBC: 8.9 10*3/uL (ref 3.4–10.8)

## 2021-10-14 LAB — FERRITIN: Ferritin: 38 ng/mL (ref 15–150)

## 2021-10-14 NOTE — Assessment & Plan Note (Signed)
S/p iron infusion during hospitalization  ?Will recheck CBC to assess hgb today  ?F/u in 1-2 weeks  ?

## 2021-10-14 NOTE — Assessment & Plan Note (Signed)
Suspect episodes of dizziness while sitting upright during transition from lying supine due to orthostatics as well as suspect a component of autonomic instability given hx of spinal cord injury, could also consider medication side effects.  ?Patient emphasizes her desire to not change any further medications ?We discussed continuing to sit up slowly and monitor symptoms given her hx of lower blood pressures ?Recommend reassessing at follow up visit to see if patient would be agreeable to weaning medications (valium and trazodone) ? ?

## 2021-10-17 ENCOUNTER — Other Ambulatory Visit: Payer: Self-pay | Admitting: Family Medicine

## 2021-10-28 ENCOUNTER — Other Ambulatory Visit: Payer: Self-pay | Admitting: Family Medicine

## 2021-10-28 ENCOUNTER — Other Ambulatory Visit (HOSPITAL_COMMUNITY): Payer: Self-pay

## 2021-10-28 MED ORDER — DIAZEPAM 5 MG PO TABS
5.0000 mg | ORAL_TABLET | Freq: Two times a day (BID) | ORAL | 1 refills | Status: DC
  Filled 2021-10-28: qty 60, 30d supply, fill #0
  Filled 2021-11-16 – 2021-11-27 (×2): qty 60, 30d supply, fill #1

## 2021-10-28 MED ORDER — GABAPENTIN 300 MG PO CAPS
600.0000 mg | ORAL_CAPSULE | Freq: Two times a day (BID) | ORAL | 2 refills | Status: DC
  Filled 2021-10-28: qty 120, 30d supply, fill #0
  Filled 2021-11-16 – 2021-11-27 (×2): qty 120, 30d supply, fill #1

## 2021-10-30 NOTE — Progress Notes (Signed)
? ? ?  SUBJECTIVE:  ? ?CHIEF COMPLAINT / HPI: f/u for anemia  ? ?Patient presents for anemia follow up .  ?She was previously evaluated for hospital follow up and had CBC that showed improved hemoglobin. Patient previously required iron infusions for her anemia. She is notable for having hx of heavy menses. Patient does not tolerate oral iron.  ? ?Dizziness with position changes  ?They report that 2-3 times daily she seems to fall asleep when moving from lying to sitting upright. She states that she has to wake up earlier to get ready for work to account for the passing out spells. She reports thi started after being discharged from the hospital. She reports being fatigued and not eating dinner for several nights due to being asleep. She also states that she misses events due to not remembering.  ?Patient states that she has been getting in trouble with work due to missing for hospitalization and having issues passing out frequently.  ?Fatigue and decreased appetite ? ?Reports she is crying and depressed due to passing out freuqnty and not able to work  ?PERTINENT  PMH / PSH:  ?Hypotension  ?Asthma  ?Quadriplegia, spastic  ? ? ?OBJECTIVE:  ? ?BP 114/84   Pulse (!) 101   Ht '5\' 5"'$  (1.651 m)   SpO2 97%   BMI 34.85 kg/m?   ?Physical Exam ?Constitutional:   ?   General: She is not in acute distress. ?   Comments: Tired appearing   ?HENT:  ?   Right Ear: External ear normal.  ?   Left Ear: External ear normal.  ?   Nose: Nose normal.  ?   Mouth/Throat:  ?   Pharynx: Oropharynx is clear.  ?Cardiovascular:  ?   Rate and Rhythm: Normal rate and regular rhythm.  ?Pulmonary:  ?   Effort: Pulmonary effort is normal.  ?Musculoskeletal:  ?   Cervical back: Normal range of motion.  ?Skin: ?   Findings: No erythema or rash.  ?Neurological:  ?   General: No focal deficit present.  ?   Mental Status: She is alert and oriented to person, place, and time.  ?Psychiatric:     ?   Attention and Perception: Attention normal.     ?    Mood and Affect: Mood is depressed. Affect is blunt.     ?   Speech: Speech normal.     ?   Behavior: Behavior normal.     ?   Thought Content: Thought content normal.     ?   Judgment: Judgment normal.  ? ? ?ASSESSMENT/PLAN:  ? ?Microcytic anemia ?Will recheck CBC today  ? ? ?Dizziness ?Start midodrine 2.'5mg'$  TID  ?Patient to follow in two weeks with PCP  ?  ? ? ?Eulis Foster, MD ?[place]  ?

## 2021-10-31 ENCOUNTER — Encounter: Payer: Self-pay | Admitting: Family Medicine

## 2021-10-31 ENCOUNTER — Ambulatory Visit (INDEPENDENT_AMBULATORY_CARE_PROVIDER_SITE_OTHER): Payer: Medicare Other | Admitting: Family Medicine

## 2021-10-31 ENCOUNTER — Other Ambulatory Visit: Payer: Self-pay

## 2021-10-31 VITALS — BP 114/84 | HR 101 | Ht 65.0 in

## 2021-10-31 DIAGNOSIS — R42 Dizziness and giddiness: Secondary | ICD-10-CM

## 2021-10-31 DIAGNOSIS — D509 Iron deficiency anemia, unspecified: Secondary | ICD-10-CM | POA: Diagnosis not present

## 2021-10-31 MED ORDER — MIDODRINE HCL 2.5 MG PO TABS: 2.5000 mg | ORAL_TABLET | Freq: Three times a day (TID) | ORAL | 0 refills | Status: DC

## 2021-10-31 NOTE — Patient Instructions (Addendum)
We will follow up with your hemoglobin levels once the results are available.  ? ?You will start the midodrine 2.'5mg'$  three times daily. If you would like to start with taking it once at night with food then that will be ok to increase slowly.  ? ?You are scheduled to follow up with Dr. Nori Riis on 11/12/21 at 8:50 AM  ? ? ? ? ? ?

## 2021-11-01 LAB — CBC
Hematocrit: 31.2 % — ABNORMAL LOW (ref 34.0–46.6)
Hemoglobin: 9.4 g/dL — ABNORMAL LOW (ref 11.1–15.9)
MCH: 22.3 pg — ABNORMAL LOW (ref 26.6–33.0)
MCHC: 30.1 g/dL — ABNORMAL LOW (ref 31.5–35.7)
MCV: 74 fL — ABNORMAL LOW (ref 79–97)
Platelets: 591 10*3/uL — ABNORMAL HIGH (ref 150–450)
RBC: 4.21 x10E6/uL (ref 3.77–5.28)
RDW: 26 % — ABNORMAL HIGH (ref 11.7–15.4)
WBC: 7.8 10*3/uL (ref 3.4–10.8)

## 2021-11-02 DIAGNOSIS — D509 Iron deficiency anemia, unspecified: Secondary | ICD-10-CM | POA: Insufficient documentation

## 2021-11-02 NOTE — Assessment & Plan Note (Signed)
Will recheck CBC today. 

## 2021-11-02 NOTE — Assessment & Plan Note (Addendum)
Start midodrine 2.'5mg'$  TID  ?Patient to follow in two weeks with PCP  ?

## 2021-11-12 ENCOUNTER — Ambulatory Visit: Payer: Medicare Other | Admitting: Family Medicine

## 2021-11-14 ENCOUNTER — Telehealth: Payer: Self-pay | Admitting: Physical Medicine and Rehabilitation

## 2021-11-14 NOTE — Telephone Encounter (Signed)
Patient would like for Dr. Dagoberto Ligas to call her.  She is depressed and all she does is cry.  She went to her primary doctor about it and they sent her home.  She would like for someone to call her back.

## 2021-11-16 ENCOUNTER — Other Ambulatory Visit: Payer: Self-pay | Admitting: Family Medicine

## 2021-11-18 ENCOUNTER — Emergency Department (HOSPITAL_COMMUNITY): Payer: Medicare Other

## 2021-11-18 ENCOUNTER — Telehealth (HOSPITAL_BASED_OUTPATIENT_CLINIC_OR_DEPARTMENT_OTHER): Payer: Medicare Other

## 2021-11-18 ENCOUNTER — Telehealth (INDEPENDENT_AMBULATORY_CARE_PROVIDER_SITE_OTHER): Payer: Medicare Other

## 2021-11-18 ENCOUNTER — Telehealth: Payer: Self-pay | Admitting: Family Medicine

## 2021-11-18 ENCOUNTER — Other Ambulatory Visit (HOSPITAL_COMMUNITY): Payer: Self-pay

## 2021-11-18 ENCOUNTER — Other Ambulatory Visit: Payer: Self-pay

## 2021-11-18 ENCOUNTER — Emergency Department (HOSPITAL_COMMUNITY)
Admission: EM | Admit: 2021-11-18 | Discharge: 2021-11-18 | Disposition: A | Discharge status: Home / Self Care | Payer: Medicare Other | Attending: Emergency Medicine | Admitting: Emergency Medicine

## 2021-11-18 ENCOUNTER — Encounter (HOSPITAL_COMMUNITY): Payer: Self-pay

## 2021-11-18 DIAGNOSIS — M5412 Radiculopathy, cervical region: Secondary | ICD-10-CM | POA: Diagnosis not present

## 2021-11-18 DIAGNOSIS — Z91040 Latex allergy status: Secondary | ICD-10-CM | POA: Insufficient documentation

## 2021-11-18 DIAGNOSIS — R200 Anesthesia of skin: Secondary | ICD-10-CM | POA: Insufficient documentation

## 2021-11-18 DIAGNOSIS — G894 Chronic pain syndrome: Secondary | ICD-10-CM | POA: Diagnosis not present

## 2021-11-18 DIAGNOSIS — R0602 Shortness of breath: Secondary | ICD-10-CM | POA: Diagnosis not present

## 2021-11-18 DIAGNOSIS — F322 Major depressive disorder, single episode, severe without psychotic features: Secondary | ICD-10-CM | POA: Diagnosis not present

## 2021-11-18 DIAGNOSIS — R001 Bradycardia, unspecified: Secondary | ICD-10-CM | POA: Insufficient documentation

## 2021-11-18 DIAGNOSIS — R531 Weakness: Secondary | ICD-10-CM | POA: Diagnosis not present

## 2021-11-18 DIAGNOSIS — Z79899 Other long term (current) drug therapy: Secondary | ICD-10-CM | POA: Diagnosis not present

## 2021-11-18 DIAGNOSIS — R29898 Other symptoms and signs involving the musculoskeletal system: Secondary | ICD-10-CM

## 2021-11-18 DIAGNOSIS — R29818 Other symptoms and signs involving the nervous system: Secondary | ICD-10-CM | POA: Diagnosis not present

## 2021-11-18 DIAGNOSIS — G8250 Quadriplegia, unspecified: Secondary | ICD-10-CM

## 2021-11-18 LAB — CBC
HCT: 31.2 % — ABNORMAL LOW (ref 36.0–46.0)
Hemoglobin: 9.6 g/dL — ABNORMAL LOW (ref 12.0–15.0)
MCH: 23.4 pg — ABNORMAL LOW (ref 26.0–34.0)
MCHC: 30.8 g/dL (ref 30.0–36.0)
MCV: 75.9 fL — ABNORMAL LOW (ref 80.0–100.0)
Platelets: 355 10*3/uL (ref 150–400)
RBC: 4.11 MIL/uL (ref 3.87–5.11)
RDW: 22.8 % — ABNORMAL HIGH (ref 11.5–15.5)
WBC: 8.9 10*3/uL (ref 4.0–10.5)
nRBC: 0 % (ref 0.0–0.2)

## 2021-11-18 LAB — URINALYSIS, ROUTINE W REFLEX MICROSCOPIC
Bilirubin Urine: NEGATIVE
Glucose, UA: NEGATIVE mg/dL
Ketones, ur: 5 mg/dL — AB
Nitrite: POSITIVE — AB
Protein, ur: NEGATIVE mg/dL
Specific Gravity, Urine: 1.011 (ref 1.005–1.030)
WBC, UA: >50 WBC/hpf — ABNORMAL HIGH (ref 0–5)
pH: 5 (ref 5.0–8.0)

## 2021-11-18 LAB — COMPREHENSIVE METABOLIC PANEL WITH GFR
ALT: 20 U/L (ref 0–44)
AST: 28 U/L (ref 15–41)
Albumin: 3.5 g/dL (ref 3.5–5.0)
Alkaline Phosphatase: 89 U/L (ref 38–126)
Anion gap: 7 (ref 5–15)
BUN: 9 mg/dL (ref 6–20)
CO2: 24 mmol/L (ref 22–32)
Calcium: 9.1 mg/dL (ref 8.9–10.3)
Chloride: 109 mmol/L (ref 98–111)
Creatinine, Ser: 0.53 mg/dL (ref 0.44–1.00)
GFR, Estimated: >60 mL/min
Glucose, Bld: 128 mg/dL — ABNORMAL HIGH (ref 70–99)
Potassium: 3.9 mmol/L (ref 3.5–5.1)
Sodium: 140 mmol/L (ref 135–145)
Total Bilirubin: 0.4 mg/dL (ref 0.3–1.2)
Total Protein: 7.4 g/dL (ref 6.5–8.1)

## 2021-11-18 LAB — DIFFERENTIAL
Abs Immature Granulocytes: 0.02 K/uL (ref 0.00–0.07)
Basophils Absolute: 0 K/uL (ref 0.0–0.1)
Basophils Relative: 0 %
Eosinophils Absolute: 0.2 K/uL (ref 0.0–0.5)
Eosinophils Relative: 2 %
Immature Granulocytes: 0 %
Lymphocytes Relative: 36 %
Lymphs Abs: 3.2 K/uL (ref 0.7–4.0)
Monocytes Absolute: 0.4 K/uL (ref 0.1–1.0)
Monocytes Relative: 4 %
Neutro Abs: 5.1 K/uL (ref 1.7–7.7)
Neutrophils Relative %: 58 %

## 2021-11-18 LAB — I-STAT BETA HCG BLOOD, ED (MC, WL, AP ONLY): I-stat hCG, quantitative: <5 m[IU]/mL (ref <5)

## 2021-11-18 LAB — PROTIME-INR
INR: 1.1 (ref 0.8–1.2)
Prothrombin Time: 13.9 s (ref 11.4–15.2)

## 2021-11-18 LAB — ETHANOL: Alcohol, Ethyl (B): <10 mg/dL (ref <10)

## 2021-11-18 LAB — APTT: aPTT: 36 seconds (ref 24–36)

## 2021-11-18 MED ORDER — HYDROCODONE-ACETAMINOPHEN 5-325 MG PO TABS
1.0000 | ORAL_TABLET | Freq: Four times a day (QID) | ORAL | 0 refills | Status: DC | PRN
Start: 2021-11-18 — End: 2021-12-12

## 2021-11-18 MED ORDER — CITALOPRAM HYDROBROMIDE 20 MG PO TABS: 20.0000 mg | ORAL_TABLET | Freq: Every day | ORAL | 5 refills | Status: DC

## 2021-11-18 MED ORDER — MEDIHONEY WOUND/BURN DRESSING EX PSTE
1.0000 | PASTE | Freq: Every day | CUTANEOUS | 0 refills | Status: DC
Start: 2021-11-18 — End: 2023-01-25
  Filled 2021-11-18 – 2021-11-27 (×2): qty 103, 103d supply, fill #0
  Filled 2021-12-30: qty 103, 30d supply, fill #0
  Filled 2022-01-05 – 2022-05-08 (×4): qty 103, 103d supply, fill #0

## 2021-11-18 MED ORDER — DIPHENHYDRAMINE HCL 25 MG PO TABS
25.0000 mg | ORAL_TABLET | Freq: Four times a day (QID) | ORAL | 3 refills | Status: DC | PRN
  Filled 2021-11-18 – 2021-11-27 (×2): qty 30, 8d supply, fill #0

## 2021-11-18 MED ORDER — LORAZEPAM 2 MG/ML IJ SOLN: 1.0000 mg | INTRAMUSCULAR | Status: DC | PRN

## 2021-11-18 MED ORDER — PREDNISONE 20 MG PO TABS: 60.0000 mg | ORAL_TABLET | ORAL | Status: DC

## 2021-11-18 MED ORDER — PREDNISONE 20 MG PO TABS: 40.0000 mg | ORAL_TABLET | Freq: Every day | ORAL | 0 refills | Status: DC

## 2021-11-18 MED ORDER — QUETIAPINE FUMARATE 25 MG PO TABS: 25.0000 mg | ORAL_TABLET | Freq: Two times a day (BID) | ORAL | 0 refills | Status: DC

## 2021-11-18 NOTE — Discharge Instructions (Signed)
As discussed, our neurology colleagues should contact you for follow-up in the coming days.  In addition to seeing those individuals, please schedule follow-up with your primary care physician.  Return here for concerning changes in your condition.

## 2021-11-18 NOTE — Telephone Encounter (Signed)
Patient called and stated her right arm was numb. Called patient and she said it started around 6 pm yesterday 11/17/21 and has not gotten better. 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 She stated she is having some headaches, but she said Dr. Dagoberto Ligas told her those type of headaches come from AD. She would like a phone call from Dr. Dagoberto Ligas

## 2021-11-18 NOTE — Telephone Encounter (Addendum)
I called Tracey Hall- she was crying the entire time Her power w/c has broken down-her pain is out of control; her spasticity is out of control-  she's so depressed, she cannot stop crying, per her.  The acute hospital was so focused on her low BP, per Tracey Hall, that they stopped her pain meds and Celexa that I ordered- she says she cannot go on like this.   I had her go through her meds- she's off Topamax. They took her off all pain meds except tramadol 50 mg QHS.  Tracey Hall says her pain is out of control and she's now passed out multiple times and had never passed out before was started on Midodrine, completely "scared silly of taking again".  They completely changed her Zanaflex and Valium dosing in hospital- I'm trying to figure out what changed.   In the middle of speaking with her- the phone went silent- and then when attempted to call back- just kept ringing- and then said not available- also attempted to call her husband- Darryl- it went straight to voicemail and said he also wasn't available.   Have sent in Rx for Celexa 20 mg since on Duloxetine supposedly, but of the meds she read out to me, didn't have it- also Sent in [place] #90- since I think her pain being poorly controlled is the biggest issue and feeling unsupported for her SCI except for by me.   I'm concerned about her- I've attempted to call back 3 times- no answer- not clear if her phone went dead or she hung up?   Got back ahold of her. Her phone died.  Another issue is that her RUE is not working- numb and been this way since 2am- she says it's limp I feel she needs to be hospitalized to get her arm addressed- she' terrified of going to hospital.   Will also send in Seroquel 25 mg 2x/day- to help calm her down- to see if can keep her out of psych hospital.   Number- [phone]- is the right number. Told her I'd call her tomorrow to check on her and write letter for her job-  Send to [email or web address] Needs note to say she's having  acute issues and cannot work as well as reduced schedule.   I spent a total of  39  minutes on total care today- >50% coordination of care- due to entire time on phone   Tracey Hall was seen by phone- she did not have an appointment, however had issues that needed to be dealt with-- I was in clinic-   I connected with  Reina Fuse on 11/18/21  by a video enabled telemedicine application and verified that I am speaking with the correct person using two identifiers.   I discussed the limitations of evaluation and management by telemedicine. The patient expressed understanding and agreed to proceed.

## 2021-11-18 NOTE — Telephone Encounter (Signed)
Patient calls nurse line reporting right arm numbness.   Patient reports she was getting in her wheelchair yesterday and unsure if she "pinched a nerve."   Patient reports her arm has been numb for greater than 12 hours. Patient reports she can not hold anything in that hand or perform her job duties such as typing etc.   Patient also reported she had 6 syncope episodes yesterday associated with a "severe" headache prior to numbness.   Patient denies any chest pains, SOB, vision changes, facial changes or slurred speech.   Patient advised she needs to be evaluated, however would like to speak to PCP before reporting to ED.   Will forward to PCP.
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
## 2021-11-18 NOTE — ED Triage Notes (Addendum)
Patient reports that she began having right arm numbness yesterday around 1800. Patient states she has had 2 episodes of LOC today. HR-44/sinus  Patient states that she has been biting herself to see if she could feel it.

## 2021-11-18 NOTE — ED Notes (Signed)
Covid test refused by pt.

## 2021-11-20 ENCOUNTER — Telehealth: Payer: Self-pay

## 2021-11-20 ENCOUNTER — Telehealth: Payer: Self-pay | Admitting: *Deleted

## 2021-11-20 DIAGNOSIS — G8250 Quadriplegia, unspecified: Secondary | ICD-10-CM

## 2021-11-20 NOTE — Telephone Encounter (Signed)
Patient called and left voicemail. Returned call and she stated she still couldn't feel or move her arm. She stated she went to the ED on 11/18/21 and they did a CT scan of her neck and head. Spoke with Dr. Dagoberto Ligas and informed her of what was going on and how the patient was crying during the phone call.

## 2021-11-20 NOTE — Telephone Encounter (Signed)
Per Dr Dagoberto Ligas I have placed an order for CT Myelogram.

## 2021-11-24 NOTE — Telephone Encounter (Signed)
Patient was on the phone the entire time and I was in clinic-  Trying to get her CT myelogram since head and cervical CT are negative for cause.  Have placed order and spoke with [facility]; also spoke with IR as well as nurse in the clinic to get this order placed and on schedule.

## 2021-11-24 NOTE — Telephone Encounter (Signed)
Multiple attempts to reach [facility] about the order that ws placed on 6.1.23 and if it was the correct order. Today I was directed to speak with Caryl Pina as Anderson Malta is off on Mondays. Caryl Pina called back about the order for the CT Myelogram. After talking with Dr Patrecia Pour it has been determine the orders went to [facility] and need to be changed to [facility] radiology. This has been done.

## 2021-11-24 NOTE — Addendum Note (Signed)
Addended by: Caro Hight on: 11/24/2021 03:49 PM   Modules accepted: Orders

## 2021-11-25 ENCOUNTER — Encounter: Payer: Self-pay | Admitting: Neurology

## 2021-11-25 ENCOUNTER — Encounter: Payer: Self-pay | Admitting: *Deleted

## 2021-11-25 NOTE — Telephone Encounter (Signed)
Call placed to Centralized scheduling to follow up on order. It has not been scheduled. I advised it needs to be done ASAP.

## 2021-11-26 ENCOUNTER — Other Ambulatory Visit: Payer: Self-pay | Admitting: Physical Medicine and Rehabilitation

## 2021-11-26 DIAGNOSIS — G8250 Quadriplegia, unspecified: Secondary | ICD-10-CM

## 2021-11-27 ENCOUNTER — Other Ambulatory Visit (HOSPITAL_COMMUNITY): Payer: Self-pay

## 2021-12-03 ENCOUNTER — Inpatient Hospital Stay (HOSPITAL_COMMUNITY): Admission: RE | Admit: 2021-12-03 | Payer: Medicare Other | Source: Ambulatory Visit

## 2021-12-03 ENCOUNTER — Ambulatory Visit (HOSPITAL_COMMUNITY): Admission: RE | Admit: 2021-12-03 | Payer: Medicare Other | Source: Ambulatory Visit

## 2021-12-06 ENCOUNTER — Other Ambulatory Visit (HOSPITAL_COMMUNITY): Payer: Self-pay

## 2021-12-08 ENCOUNTER — Telehealth: Payer: Self-pay

## 2021-12-08 NOTE — Telephone Encounter (Signed)
Patient called asking about results of a urine culture done. I advised patient to call her PCP.

## 2021-12-09 ENCOUNTER — Telehealth: Payer: Self-pay

## 2021-12-09 NOTE — Progress Notes (Signed)
[facility] Visit  Patient consented to have virtual visit and was identified by name and date of birth. Method of visit: Video  Encounter participants: Patient: Tracey Hall - located at [facility] parking lot  Provider: Eulis Foster - located at [facility]  Others (if applicable): patient husband   Chief Complaint: concern for UTI   HPI: Patient arrived late to appointment scheduled for 4:10PM (arrived 4:28PM).  Her husband reports to current days stating that the patient was unable to get out of their vehicle due to not having her electric wheelchair.  Front desk staff offered to help patient into manual wheelchair in order to be arrived for the appointment but wanted to make sure that provider will be able to see patient due to late arrival.  I spoke with patient's spouse at front desk area and agreed that I would be able to see the patient if she was able to come into the building.  Patient's husband states that the patient asked if we could simply obtain a urine sample as she is currently catheterized.  Discussed with clinic staff, who states that we are unable to change catheter here as I did not recommend obtaining urine sample from current bag as reliability would not be good. I asked patient's husband if she sees a urologist regularly.  He denies and states that he changes her catheter bag.  Patient's husband states that when she normally has symptoms of a urinary tract infection she usually has a urinary tract infection and later diagnosed.  He reports that patient has had symptoms consisting of abdominal discomfort.  He reports that she has had fevers.  [place] desk staffed confirmed patient's phone number with husband prior to him leaving the building. Attempted to connect with patient via virtual visit four times without an answer.  Unable to get an answer via telephone call x2 in order to speak directly to the patient.  ROS: per HPI  Pertinent  PMHx:  Recurrent UTI  Pyelonephritis  Neurogenic bowel   Exam:  LMP 11/04/2021 (Approximate)   Unable to complete physical exam   Assessment/Plan:  No problem-specific Assessment & Plan notes found for this encounter.   Attempted to call patient's number that was confirmed by her husband 5 times with no answer.  Left voicemail recommending patient to call and reschedule appointment.  Also attempted to contact patient's spouse's number with no answer.  Only option was to leave voicemail so left voicemail recommending for them to reschedule patient's appointment at earliest convenience.   Given patient's history of  quadriplegia, difficulties with mobility today and inability to connect for virtual visit, will treat empirically in the event that patient likely has UTI and concern for potential pyelonephritis given her history.  Review of previous micro of urine cultures shows pansensitive E. coli for latest urine culture in March 2023  We will treat with Keflex for 10-day course  Time spent during visit with patient: 2 minutes

## 2021-12-09 NOTE — Telephone Encounter (Signed)
Patient calls nurse line regarding possible UTI. Patient has been having body aches, decreased appetite and fluid intake and headaches for the past three days. Patient has an indwelling foley catheter and has a history of UTIs. Patient states that she has also been having diarrhea for the past five days and is concerned she may have E-Coli infection.   Patient also has concerns for potentially developing sepsis, as this has happened before (related to UTI)  Patient reports significant levels of pain all across body, back and abdominal pain. Patient is tearful on the phone.   Advised that due to the severity of her symptoms, that she be evaluated in UC or ED. Patient declined, stating that she is not willing to go to the ED.   Patient is requesting recommendations for management of condition over the next 24 hours.   Spoke with Dr. Nori Riis regarding patient. Recommended that patient be seen prior to any new medications being sent in. Also informed patient that if symptoms were to significantly worsen she should be seen in the ED.   Patient is also asking about pain medication. Patient was only able to pick up partial refill on norco that was sent in on 5/30. Patient picked up 28 tablets on 6/2 as insurance would only cover seven days without a prior auth. Patient requesting that I call Dr. Florentina Jenny office regarding request. Attempted to call office, office has already closed. Called patient back, advised patient that she should call their office tomorrow morning in regards to receiving the rest of this prescription.   In the meantime, recommended OTC tylenol for this evening.   Talbot Grumbling, RN

## 2021-12-10 ENCOUNTER — Telehealth (INDEPENDENT_AMBULATORY_CARE_PROVIDER_SITE_OTHER): Payer: Medicare Other | Admitting: Family Medicine

## 2021-12-10 DIAGNOSIS — N390 Urinary tract infection, site not specified: Secondary | ICD-10-CM

## 2021-12-10 MED ORDER — CEPHALEXIN 500 MG PO CAPS: 500.0000 mg | ORAL_CAPSULE | Freq: Four times a day (QID) | ORAL | 0 refills | Status: DC

## 2021-12-11 ENCOUNTER — Telehealth: Payer: Self-pay | Admitting: *Deleted

## 2021-12-11 NOTE — Telephone Encounter (Signed)
Tracey Hall wants to know if Dr Dagoberto Ligas could refill all of her medications. She would like her to call her at [phone]

## 2021-12-12 MED ORDER — QUETIAPINE FUMARATE 25 MG PO TABS: 25.0000 mg | ORAL_TABLET | Freq: Two times a day (BID) | ORAL | 5 refills | Status: DC

## 2021-12-12 MED ORDER — GABAPENTIN 300 MG PO CAPS: 600.0000 mg | ORAL_CAPSULE | Freq: Two times a day (BID) | ORAL | 5 refills | Status: DC

## 2021-12-12 MED ORDER — HYDROCODONE-ACETAMINOPHEN 5-325 MG PO TABS: 1.0000 | ORAL_TABLET | Freq: Four times a day (QID) | ORAL | 0 refills | Status: DC | PRN

## 2021-12-12 MED ORDER — DIAZEPAM 5 MG PO TABS: 5.0000 mg | ORAL_TABLET | Freq: Two times a day (BID) | ORAL | 5 refills | Status: DC

## 2021-12-13 ENCOUNTER — Encounter (HOSPITAL_COMMUNITY): Payer: Self-pay

## 2021-12-13 ENCOUNTER — Emergency Department (HOSPITAL_COMMUNITY)
Admission: EM | Admit: 2021-12-13 | Discharge: 2021-12-13 | Disposition: A | Discharge status: Home / Self Care | Payer: Medicare Other | Attending: Emergency Medicine | Admitting: Emergency Medicine

## 2021-12-13 ENCOUNTER — Other Ambulatory Visit: Payer: Self-pay

## 2021-12-13 DIAGNOSIS — R509 Fever, unspecified: Secondary | ICD-10-CM | POA: Diagnosis present

## 2021-12-13 DIAGNOSIS — R197 Diarrhea, unspecified: Secondary | ICD-10-CM | POA: Insufficient documentation

## 2021-12-13 DIAGNOSIS — N3000 Acute cystitis without hematuria: Secondary | ICD-10-CM

## 2021-12-13 LAB — CBC WITH DIFFERENTIAL/PLATELET
Abs Immature Granulocytes: 0.04 10*3/uL (ref 0.00–0.07)
Basophils Absolute: 0 10*3/uL (ref 0.0–0.1)
Basophils Relative: 0 %
Eosinophils Absolute: 0.2 10*3/uL (ref 0.0–0.5)
Eosinophils Relative: 2 %
HCT: 27.3 % — ABNORMAL LOW (ref 36.0–46.0)
Hemoglobin: 8.5 g/dL — ABNORMAL LOW (ref 12.0–15.0)
Immature Granulocytes: 0 %
Lymphocytes Relative: 41 %
Lymphs Abs: 3.7 10*3/uL (ref 0.7–4.0)
MCH: 23.4 pg — ABNORMAL LOW (ref 26.0–34.0)
MCHC: 31.1 g/dL (ref 30.0–36.0)
MCV: 75 fL — ABNORMAL LOW (ref 80.0–100.0)
Monocytes Absolute: 0.6 10*3/uL (ref 0.1–1.0)
Monocytes Relative: 7 %
Neutro Abs: 4.4 10*3/uL (ref 1.7–7.7)
Neutrophils Relative %: 50 %
Platelets: 553 10*3/uL — ABNORMAL HIGH (ref 150–400)
RBC: 3.64 MIL/uL — ABNORMAL LOW (ref 3.87–5.11)
RDW: 19.9 % — ABNORMAL HIGH (ref 11.5–15.5)
WBC: 8.9 10*3/uL (ref 4.0–10.5)
nRBC: 0 % (ref 0.0–0.2)

## 2021-12-13 LAB — COMPREHENSIVE METABOLIC PANEL
ALT: 12 U/L (ref 0–44)
AST: 14 U/L — ABNORMAL LOW (ref 15–41)
Albumin: 3.3 g/dL — ABNORMAL LOW (ref 3.5–5.0)
Alkaline Phosphatase: 79 U/L (ref 38–126)
Anion gap: 7 (ref 5–15)
BUN: 16 mg/dL (ref 6–20)
CO2: 22 mmol/L (ref 22–32)
Calcium: 9 mg/dL (ref 8.9–10.3)
Chloride: 108 mmol/L (ref 98–111)
Creatinine, Ser: 0.67 mg/dL (ref 0.44–1.00)
GFR, Estimated: >60 mL/min (ref >60)
Glucose, Bld: 128 mg/dL — ABNORMAL HIGH (ref 70–99)
Potassium: 3.7 mmol/L (ref 3.5–5.1)
Sodium: 137 mmol/L (ref 135–145)
Total Bilirubin: 0.5 mg/dL (ref 0.3–1.2)
Total Protein: 7.3 g/dL (ref 6.5–8.1)

## 2021-12-13 LAB — URINALYSIS, ROUTINE W REFLEX MICROSCOPIC
Bilirubin Urine: NEGATIVE
Glucose, UA: NEGATIVE mg/dL
Hgb urine dipstick: NEGATIVE
Ketones, ur: NEGATIVE mg/dL
Nitrite: POSITIVE — AB
Protein, ur: NEGATIVE mg/dL
Specific Gravity, Urine: 1.034 — ABNORMAL HIGH (ref 1.005–1.030)
WBC, UA: >50 WBC/hpf — ABNORMAL HIGH (ref 0–5)
pH: 5 (ref 5.0–8.0)

## 2021-12-13 LAB — I-STAT BETA HCG BLOOD, ED (MC, WL, AP ONLY): I-stat hCG, quantitative: <5 m[IU]/mL (ref <5)

## 2021-12-13 LAB — LIPASE, BLOOD: Lipase: 20 U/L (ref 11–51)

## 2021-12-13 MED ORDER — FENTANYL CITRATE PF 50 MCG/ML IJ SOSY
50.0000 ug | PREFILLED_SYRINGE | Freq: Once | INTRAMUSCULAR | Status: AC
  Administered 2021-12-13: 50 ug via INTRAVENOUS
  Filled 2021-12-13: qty 1

## 2021-12-13 MED ORDER — SODIUM CHLORIDE 0.9 % IV SOLN
1.0000 g | Freq: Once | INTRAVENOUS | Status: AC
  Administered 2021-12-13: 1 g via INTRAVENOUS
  Filled 2021-12-13: qty 10

## 2021-12-13 MED ORDER — CEPHALEXIN 500 MG PO CAPS: 500.0000 mg | ORAL_CAPSULE | Freq: Four times a day (QID) | ORAL | 0 refills | Status: DC

## 2021-12-15 LAB — URINE CULTURE: Culture: >=100000 — AB

## 2021-12-16 ENCOUNTER — Telehealth: Payer: Self-pay | Admitting: Emergency Medicine

## 2021-12-17 ENCOUNTER — Other Ambulatory Visit: Payer: Medicare Other

## 2021-12-17 ENCOUNTER — Inpatient Hospital Stay
Admission: RE | Admit: 2021-12-17 | Discharge: 2021-12-17 | Disposition: A | Discharge status: Home / Self Care | Payer: Medicare Other | Source: Ambulatory Visit | Attending: Physical Medicine and Rehabilitation | Admitting: Physical Medicine and Rehabilitation

## 2021-12-17 NOTE — Discharge Instructions (Signed)
Myelogram Discharge Instructions  Go home and rest quietly as needed. You may resume normal activities; however, do not exert yourself strongly or do any heavy lifting today and tomorrow.   DO NOT drive today.    You may resume your normal diet and medications unless otherwise indicated. Drink a lot of extra fluids today and tomorrow.   The incidence of a spinal headache (headache, nausea and/or vomiting is about 5% (one in 20 patients).  If you develop a headache when you are sitting up or standing that gets better when you lie down, please lie flat for 24 hours and drink plenty of fluids until the headache goes away.  Caffeinated beverages may be helpful.   If you develop severe nausea and vomiting or a headache that does not go away with the flat bedrest after 48 hours, please call [phone].   Call your physician for a follow-up appointment.  The results of your myelogram will be sent directly to your physician by the following day.  Please call us at [phone] if you have any questions or if complications develop after you arrive home.   Discharge instructions have been explained to the patient.  The patient, or the person responsible for the patient, state they fully understands these instructions.   Thank you for visiting our office today.

## 2021-12-29 ENCOUNTER — Encounter: Payer: Self-pay | Admitting: Physical Medicine and Rehabilitation

## 2021-12-29 ENCOUNTER — Encounter
Payer: Medicare Other | Attending: Physical Medicine and Rehabilitation | Admitting: Physical Medicine and Rehabilitation

## 2021-12-29 VITALS — BP 94/58 | HR 77 | Ht 65.0 in

## 2021-12-29 DIAGNOSIS — F333 Major depressive disorder, recurrent, severe with psychotic symptoms: Secondary | ICD-10-CM | POA: Diagnosis present

## 2021-12-29 DIAGNOSIS — G43019 Migraine without aura, intractable, without status migrainosus: Secondary | ICD-10-CM | POA: Insufficient documentation

## 2021-12-29 DIAGNOSIS — Z993 Dependence on wheelchair: Secondary | ICD-10-CM | POA: Diagnosis present

## 2021-12-29 DIAGNOSIS — R252 Cramp and spasm: Secondary | ICD-10-CM | POA: Insufficient documentation

## 2021-12-29 DIAGNOSIS — G43719 Chronic migraine without aura, intractable, without status migrainosus: Secondary | ICD-10-CM | POA: Insufficient documentation

## 2021-12-29 DIAGNOSIS — G8250 Quadriplegia, unspecified: Secondary | ICD-10-CM | POA: Diagnosis present

## 2021-12-29 MED ORDER — FLUDROCORTISONE ACETATE 0.1 MG PO TABS: 0.1000 mg | ORAL_TABLET | Freq: Every day | ORAL | 5 refills | Status: DC

## 2021-12-29 MED ORDER — SUMATRIPTAN SUCCINATE 100 MG PO TABS: 100.0000 mg | ORAL_TABLET | ORAL | 5 refills | Status: DC | PRN

## 2021-12-29 NOTE — Patient Instructions (Signed)
Pt's BP 62/45 came up to 80s/50s then 72/51.  Pt required yelling her name- didn't even wake easily to pushing on chest/not as much pressure as sternal rub- but closes eyes again; put in leaned back position and gave her 1/2 cup water,   MS: RUE- biceps 2-/5; WE 0/5; and Triceps 0-1/5- already grip is stable at 0/5; FA 0/5 LUE- biceps 4+/5; WE 4-/5; Triceps 2/5; which is new/worse; grip- 0/5 and FA 0/5- grip/FA is stable- but rest is worse.   Neuro: Has C5 intact on RUE; but otherwise C6-T1 is ABSENT LUE_ C5 intact, but C6 to T1 DECREASED. Absent to light touch AND pinprick.      Assessment & Plan:   Patient is a 41 yr old female with C6 tetraplegia- ASIA A with contractures-  due to [place]- with neurogenic bowel and bladder, spasticity and contractures and frequent UTI/sepsis/. Also has autonomic dysreflexia- intermittent. Here for f/u on SCI.   Gave sample of Nurtec in office to try and see if HA is migraine- doesn't have AD since BP low, not elevated, so migraine much more likely- concerned about vision changes/disturbances, but could be a migraine- pt still voiced she doesn't want to go to ED, in spite of very low BP and per husband, keeps "passing out" at home.  2. In terms, of low BP/?passing out" BP 60/40s up to 80/53- latest checked 72/51- very sleepy/somnolent and required water- as well as leaning pt back in chair as far back as it would go-  which was not as back as w/c normally go.  Last BP was 94/58 3. Will start Florinef 0.1 mg daily for low BP- will do for 2 weeks- if BP still staying low at home or still passing out, will need to increase to 0.2 mg daily- for low BP- #60- 5 refills.  4. Need BP cuff and to check at home- drink water and lay back if lightheaded/dizzy or feels like will pass out- and recheck- start Florinef as above.  5. Encouraged pt to go to ED since visual changes are new- and so lightheaded/dizzy and having severe presyncope. Pt has refused multiple times.  I am doing what I can, but truly feel pt would benefit from ED visit and possible admission  6. Needs to be checked again for UTI- think she has infection that is causing her to be so sedated and somnolent- was just treated- likely needs a preventative ABX PO from Urology for prevention of some of her UTI's. I suggest being put on something.   7. New migraines- gave Nurtec here in clinic- vision didn't improve a lot, however HA got MUCH better- and now talking to me- will give Imitrex/Sumitriptan 100 mg- can take up to 2 tabs in a  day, since insurance won't pay for Nurtec until has failed other meds. Also, take Imitrex within 15 minutes on having migraine- which is defined as BAD throbbing headache- light sensitivity- and sound sensitivity as well as nausea-   8.  CT myelogram to be done tomorrow-   9. She feels very depressed- and wants to hurt self-   10. Wants to hurt self, because of new SCI- and higher level SCI- explained meds aren't going to fix it- fixing higher level SCI will be more helpful than anything- I think she needs surgery, but needs to know what needs to be done with CT myelogram. Asked pt to contract for safety. She doesn't feel she can contract for safety. Have asked for  pt to go to [facility] since won't go to [facility].    11. Has loaner w/c- but needs joystick on L side.  From [place].   12. F/U in 3 months- double appointment-
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
## 2021-12-30 ENCOUNTER — Other Ambulatory Visit: Payer: Self-pay | Admitting: Physical Medicine and Rehabilitation

## 2021-12-30 ENCOUNTER — Ambulatory Visit
Admission: RE | Admit: 2021-12-30 | Discharge: 2021-12-30 | Disposition: A | Discharge status: Home / Self Care | Payer: Medicare Other | Source: Ambulatory Visit | Attending: Physical Medicine and Rehabilitation | Admitting: Physical Medicine and Rehabilitation

## 2021-12-30 ENCOUNTER — Other Ambulatory Visit: Payer: Self-pay | Admitting: Family Medicine

## 2021-12-30 DIAGNOSIS — G8250 Quadriplegia, unspecified: Secondary | ICD-10-CM

## 2021-12-30 DIAGNOSIS — S31000A Unspecified open wound of lower back and pelvis without penetration into retroperitoneum, initial encounter: Secondary | ICD-10-CM | POA: Diagnosis not present

## 2021-12-30 MED ORDER — IOPAMIDOL (ISOVUE-M 200) INJECTION 41%
10.0000 mL | Freq: Once | INTRAMUSCULAR | Status: AC
  Administered 2021-12-30: 10 mL via INTRATHECAL

## 2021-12-30 MED ORDER — MEPERIDINE HCL 50 MG/ML IJ SOLN: 50.0000 mg | Freq: Once | INTRAMUSCULAR | Status: DC | PRN

## 2021-12-30 MED ORDER — DIAZEPAM 5 MG PO TABS: 10.0000 mg | ORAL_TABLET | Freq: Once | ORAL | Status: DC

## 2021-12-30 MED ORDER — ONDANSETRON HCL 4 MG/2ML IJ SOLN
4.0000 mg | Freq: Once | INTRAMUSCULAR | Status: DC | PRN
Start: 2021-12-30 — End: 2021-12-31

## 2021-12-30 NOTE — Discharge Instructions (Signed)

## 2021-12-31 ENCOUNTER — Telehealth: Payer: Self-pay | Admitting: Physical Medicine and Rehabilitation

## 2021-12-31 ENCOUNTER — Other Ambulatory Visit (HOSPITAL_COMMUNITY): Payer: Self-pay

## 2021-12-31 ENCOUNTER — Encounter: Payer: Self-pay | Admitting: *Deleted

## 2021-12-31 MED ORDER — SILVER SULFADIAZINE 1 % EX CREA
1.0000 | TOPICAL_CREAM | Freq: Two times a day (BID) | CUTANEOUS | 3 refills | Status: DC | PRN
  Filled 2021-12-31: qty 50, 10d supply, fill #0
  Filled 2022-02-04: qty 50, 10d supply, fill #1
  Filled 2022-03-07: qty 50, 10d supply, fill #2
  Filled 2022-03-25: qty 50, 10d supply, fill #3

## 2021-12-31 MED ORDER — HYDROCODONE-ACETAMINOPHEN 5-325 MG PO TABS
1.0000 | ORAL_TABLET | Freq: Four times a day (QID) | ORAL | 0 refills | Status: DC | PRN
  Filled 2021-12-31: qty 90, 23d supply, fill #0

## 2021-12-31 NOTE — Telephone Encounter (Signed)
Have attempted to call pt and her significant other-4x in last 24 hours.  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   Pt's exam she's now having weakness in R>L C5 and my concern is that if she doesn't have surgery to fix syrinx, that she will end up on ventilator.   There are no other numbers in the computer to call and there's no voicemail.  Will  see if staff is able to get in touch with her.

## 2021-12-31 NOTE — Telephone Encounter (Signed)
Dr Dagoberto Ligas spoke with [facility].

## 2022-01-01 ENCOUNTER — Other Ambulatory Visit (HOSPITAL_COMMUNITY): Payer: Self-pay

## 2022-01-01 NOTE — Progress Notes (Signed)
Patient ID: RAYSHA TILMON, female   DOB: 1981/04/09, 41 y.o.   MRN: [id number]  L/M on pt's voicemail after speaking with Dr Monika Salk at [facility] (since pt refuses to be seen at [facility] for surgery) He has a lot of comfort with syrinxes which is the reason pt is having UE weakness, based on CT myelogram results.   He asked pt to call clinic to get appointment- I left that number on voicemail- [phone] and to get appointment asap.  Will need to take a copy of  results/pictures of CT myelogram to that appointment- go to [facility] radiology and get disc so can take to that appointment.  Cannot be seen without it.   I attempted to speak with pt myself by calling 2 separate times, but unable ot get ahold of her today- did speak with her yesterday that was referring to Dr Tivis Ringer and went over CT myelogram results- and explained about syrinx and that it's progressive and things can get worse.  Pt voiced being upset, but also understanding.

## 2022-01-02 ENCOUNTER — Other Ambulatory Visit: Payer: Self-pay | Admitting: Physical Medicine and Rehabilitation

## 2022-01-02 DIAGNOSIS — G8250 Quadriplegia, unspecified: Secondary | ICD-10-CM

## 2022-01-02 DIAGNOSIS — G950 Syringomyelia and syringobulbia: Secondary | ICD-10-CM

## 2022-01-05 ENCOUNTER — Other Ambulatory Visit (HOSPITAL_COMMUNITY): Payer: Self-pay

## 2022-01-23 ENCOUNTER — Emergency Department (HOSPITAL_COMMUNITY): Payer: Medicare Other

## 2022-01-23 ENCOUNTER — Other Ambulatory Visit: Payer: Self-pay

## 2022-01-23 ENCOUNTER — Encounter (HOSPITAL_COMMUNITY): Payer: Self-pay

## 2022-01-23 ENCOUNTER — Emergency Department (HOSPITAL_COMMUNITY)
Admission: EM | Admit: 2022-01-23 | Discharge: 2022-01-24 | Disposition: A | Discharge status: Home / Self Care | Payer: Medicare Other | Attending: Emergency Medicine | Admitting: Emergency Medicine

## 2022-01-23 DIAGNOSIS — R531 Weakness: Secondary | ICD-10-CM | POA: Diagnosis not present

## 2022-01-23 DIAGNOSIS — Z91040 Latex allergy status: Secondary | ICD-10-CM | POA: Diagnosis not present

## 2022-01-23 DIAGNOSIS — R109 Unspecified abdominal pain: Secondary | ICD-10-CM | POA: Diagnosis not present

## 2022-01-23 DIAGNOSIS — A419 Sepsis, unspecified organism: Secondary | ICD-10-CM | POA: Diagnosis not present

## 2022-01-23 DIAGNOSIS — M791 Myalgia, unspecified site: Secondary | ICD-10-CM | POA: Insufficient documentation

## 2022-01-23 DIAGNOSIS — R079 Chest pain, unspecified: Secondary | ICD-10-CM | POA: Diagnosis not present

## 2022-01-23 DIAGNOSIS — R1084 Generalized abdominal pain: Secondary | ICD-10-CM | POA: Diagnosis not present

## 2022-01-23 DIAGNOSIS — N3000 Acute cystitis without hematuria: Secondary | ICD-10-CM

## 2022-01-23 LAB — URINALYSIS, ROUTINE W REFLEX MICROSCOPIC
Bilirubin Urine: NEGATIVE
Glucose, UA: NEGATIVE mg/dL
Ketones, ur: 20 mg/dL — AB
Nitrite: POSITIVE — AB
Protein, ur: 100 mg/dL — AB
Specific Gravity, Urine: >1.046 — ABNORMAL HIGH (ref 1.005–1.030)
Squamous Epithelial / HPF: >50 — ABNORMAL HIGH (ref 0–5)
WBC, UA: >50 WBC/hpf — ABNORMAL HIGH (ref 0–5)
pH: 5 (ref 5.0–8.0)

## 2022-01-23 LAB — COMPREHENSIVE METABOLIC PANEL
ALT: 13 U/L (ref 0–44)
AST: 14 U/L — ABNORMAL LOW (ref 15–41)
Albumin: 4.1 g/dL (ref 3.5–5.0)
Alkaline Phosphatase: 90 U/L (ref 38–126)
Anion gap: 11 (ref 5–15)
BUN: <5 mg/dL — ABNORMAL LOW (ref 6–20)
CO2: 27 mmol/L (ref 22–32)
Calcium: 9 mg/dL (ref 8.9–10.3)
Chloride: 103 mmol/L (ref 98–111)
Creatinine, Ser: 0.47 mg/dL (ref 0.44–1.00)
GFR, Estimated: >60 mL/min (ref >60)
Glucose, Bld: 106 mg/dL — ABNORMAL HIGH (ref 70–99)
Potassium: 2.6 mmol/L — CL (ref 3.5–5.1)
Sodium: 141 mmol/L (ref 135–145)
Total Bilirubin: 0.4 mg/dL (ref 0.3–1.2)
Total Protein: 8.2 g/dL — ABNORMAL HIGH (ref 6.5–8.1)

## 2022-01-23 LAB — CBC WITH DIFFERENTIAL/PLATELET
Abs Immature Granulocytes: 0.01 10*3/uL (ref 0.00–0.07)
Basophils Absolute: 0 10*3/uL (ref 0.0–0.1)
Basophils Relative: 0 %
Eosinophils Absolute: 0.2 10*3/uL (ref 0.0–0.5)
Eosinophils Relative: 3 %
HCT: 29.5 % — ABNORMAL LOW (ref 36.0–46.0)
Hemoglobin: 9 g/dL — ABNORMAL LOW (ref 12.0–15.0)
Immature Granulocytes: 0 %
Lymphocytes Relative: 36 %
Lymphs Abs: 2.3 10*3/uL (ref 0.7–4.0)
MCH: 21.9 pg — ABNORMAL LOW (ref 26.0–34.0)
MCHC: 30.5 g/dL (ref 30.0–36.0)
MCV: 71.8 fL — ABNORMAL LOW (ref 80.0–100.0)
Monocytes Absolute: 0.2 10*3/uL (ref 0.1–1.0)
Monocytes Relative: 3 %
Neutro Abs: 3.8 10*3/uL (ref 1.7–7.7)
Neutrophils Relative %: 58 %
Platelets: 570 10*3/uL — ABNORMAL HIGH (ref 150–400)
RBC: 4.11 MIL/uL (ref 3.87–5.11)
RDW: 16.9 % — ABNORMAL HIGH (ref 11.5–15.5)
WBC: 6.5 10*3/uL (ref 4.0–10.5)
nRBC: 0 % (ref 0.0–0.2)

## 2022-01-23 LAB — PROTIME-INR
INR: 1 (ref 0.8–1.2)
Prothrombin Time: 13 seconds (ref 11.4–15.2)

## 2022-01-23 LAB — LACTIC ACID, PLASMA
Lactic Acid, Venous: 1.7 mmol/L (ref 0.5–1.9)
Lactic Acid, Venous: 1.6 mmol/L (ref 0.5–1.9)

## 2022-01-23 MED ORDER — SODIUM CHLORIDE 0.9 % IV SOLN
2.0000 g | Freq: Once | INTRAVENOUS | Status: AC
  Administered 2022-01-23: 2 g via INTRAVENOUS
  Filled 2022-01-23: qty 20

## 2022-01-23 MED ORDER — MORPHINE SULFATE (PF) 4 MG/ML IV SOLN
4.0000 mg | Freq: Once | INTRAVENOUS | Status: AC
  Administered 2022-01-23: 4 mg via INTRAVENOUS
  Filled 2022-01-23: qty 1

## 2022-01-23 MED ORDER — IOHEXOL 300 MG/ML  SOLN
100.0000 mL | Freq: Once | INTRAMUSCULAR | Status: AC | PRN
  Administered 2022-01-23: 100 mL via INTRAVENOUS

## 2022-01-23 MED ORDER — CEPHALEXIN 500 MG PO CAPS
500.0000 mg | ORAL_CAPSULE | Freq: Three times a day (TID) | ORAL | 0 refills | Status: AC
Start: 2022-01-23 — End: 2022-01-30

## 2022-01-23 MED ORDER — POTASSIUM CHLORIDE 20 MEQ PO PACK
60.0000 meq | PACK | Freq: Once | ORAL | Status: AC
  Administered 2022-01-23: 60 meq via ORAL
  Filled 2022-01-23: qty 3

## 2022-01-23 MED ORDER — KETOROLAC TROMETHAMINE 15 MG/ML IJ SOLN
15.0000 mg | Freq: Once | INTRAMUSCULAR | Status: AC
  Administered 2022-01-23: 15 mg via INTRAVENOUS
  Filled 2022-01-23: qty 1

## 2022-01-23 MED ORDER — MAGNESIUM SULFATE 2 GM/50ML IV SOLN
2.0000 g | Freq: Once | INTRAVENOUS | Status: AC
  Administered 2022-01-23: 2 g via INTRAVENOUS
  Filled 2022-01-23: qty 50

## 2022-01-23 MED ORDER — HYDROCODONE-ACETAMINOPHEN 5-325 MG PO TABS
1.0000 | ORAL_TABLET | Freq: Once | ORAL | Status: AC
  Administered 2022-01-23: 1 via ORAL
  Filled 2022-01-23: qty 1

## 2022-01-23 MED ORDER — LACTATED RINGERS IV BOLUS
1000.0000 mL | Freq: Once | INTRAVENOUS | Status: AC
  Administered 2022-01-23: 1000 mL via INTRAVENOUS
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
## 2022-01-23 NOTE — ED Triage Notes (Signed)
Pt reports generalized body pain that began today. Pt arrives hyperventilating and handed triage RN paperwork from a visit on 7/10 and said the paperwork would tell me why she is here. Paperwork states that pt was very hypotensive that day and was unresponsive. Pt reports she has not been seen since then.

## 2022-01-23 NOTE — ED Provider Notes (Signed)
[facility] DEPT Provider Note   CSN: [id number] Arrival date & time: 01/23/22  1351     History  Chief Complaint  Patient presents with  . Generalized Body Aches    Tracey Hall is a 41 y.o. female with history of spinal cord injury, frequent UTIs, chronic indwelling Foley use, presenting to the emergency department for body aches.  Patient reports generalized weakness and body aches since yesterday.  She reports this is similar to prior episodes of urinary infections.  Reports generalized weakness, no fevers, but does report chills.  No cough, runny nose, sore throat, headache.  No diarrhea.  She reports this feels similar to when she was at her rehab office in early July and low blood pressure but she never went to the emergency department for that.   HPI     Home Medications Prior to Admission medications   Medication Sig Start Date End Date Taking? Authorizing Provider  cephALEXin (KEFLEX) 500 MG capsule Take 1 capsule (500 mg total) by mouth 4 (four) times daily. 12/13/21   Montine Circle, PA-C  citalopram (CELEXA) 20 MG tablet Take 1 tablet (20 mg total) by mouth daily. 11/18/21   Lovorn, Jinny Blossom, MD  diazepam (VALIUM) 5 MG tablet Take 1 tablet by mouth 2 times daily. 12/12/21   Lovorn, Jinny Blossom, MD  diphenhydrAMINE (BENADRYL ALLERGY) 25 MG tablet Take 1 tablet by mouth every 6 hours as needed for itching. 11/18/21   Dickie La, MD  diphenhydramine-acetaminophen (TYLENOL PM) 25-500 MG TABS tablet Take 2 tablets by mouth at bedtime as needed (sleep).    [provider]  DULoxetine (CYMBALTA) 30 MG capsule Take 1 capsule (30 mg total) by mouth daily. 09/21/21   Lilland, Alana, DO  fludrocortisone (FLORINEF) 0.1 MG tablet Take 1 tablet (0.1 mg total) by mouth daily. Take daily for 2 weeks- need to check BP at home- if still <90/50s- or still passing out, increase Florinef to 0.2 mg daily 12/29/21   Lovorn, Jinny Blossom, MD  gabapentin (NEURONTIN) 300 MG  capsule Take 2 capsules by mouth 2 times daily. 12/12/21   Lovorn, Jinny Blossom, MD  HYDROcodone-acetaminophen (NORCO) 5-325 MG tablet Take 1 tablet by mouth every 6 (six) hours as needed for severe pain. 12/31/21   Lovorn, Jinny Blossom, MD  leptospermum manuka honey (MEDIHONEY) PSTE paste Apply topically daily as directed. 11/18/21   Dickie La, MD  lidocaine (XYLOCAINE) 2 % jelly 1 application. by Other route 3 (three) times a week. 09/24/21   France Ravens, MD  midodrine (PROAMATINE) 2.5 MG tablet Take 1 tablet (2.5 mg total) by mouth 3 (three) times daily with meals. 10/31/21   Simmons-Robinson, Riki Sheer, MD  predniSONE (DELTASONE) 20 MG tablet Take 2 tablets (40 mg total) by mouth daily with breakfast. For the next four days 11/18/21   Carmin Muskrat, MD  QUEtiapine (SEROQUEL) 25 MG tablet Take 1 tablet (25 mg total) by mouth 2 (two) times daily. As needed to sleep and calm things down 12/12/21   Lovorn, Jinny Blossom, MD  silver sulfADIAZINE (SILVADENE) 1 % cream Apply 1 Application topically 2 (two) times daily as needed (for breakout). 12/31/21   Dickie La, MD  SUMAtriptan (IMITREX) 100 MG tablet Take 1 tablet (100 mg total) by mouth every 2 (two) hours as needed for migraine. May repeat in 2 hours if headache persists or recurs.  Max 2 pills/day- start within 15 minutes of headache starting 12/29/21   Courtney Heys, MD  topiramate (TOPAMAX) 25 MG tablet  TAKE 1 TABLET(25 MG) BY MOUTH AT BEDTIME Patient taking differently: Take 25 mg by mouth at bedtime. 08/22/21   Lovorn, Jinny Blossom, MD  traMADol (ULTRAM) 50 MG tablet Take 1 tablet (50 mg total) by mouth every 6 (six) hours as needed. Patient taking differently: Take 50 mg by mouth at bedtime as needed for moderate pain. 11/27/20   Lovorn, Jinny Blossom, MD  traZODone (DESYREL) 50 MG tablet TAKE 1 TABLET(50 MG) BY MOUTH AT BEDTIME AS NEEDED FOR SLEEP Patient taking differently: Take 50 mg by mouth at bedtime as needed for sleep. 09/29/21   Lovorn, Jinny Blossom, MD      Allergies    Cortisone  and Latex    Review of Systems   Review of Systems  All other systems reviewed and are negative.   Physical Exam Updated Vital Signs BP (!) 133/104   Pulse 70   Temp 97.7 F (36.5 C) (Oral)   Resp 18   Ht '5\' 5"'$  (1.651 m)   Wt 90.7 kg   SpO2 99%   BMI 33.27 kg/m  Physical Exam Constitutional:      General: She is in acute distress (due to pain).     Appearance: Normal appearance. She is obese. She is not ill-appearing.  HENT:     Head: Normocephalic and atraumatic.     Right Ear: External ear normal.     Left Ear: External ear normal.     Mouth/Throat:     Mouth: Mucous membranes are moist.  Eyes:     Conjunctiva/sclera: Conjunctivae normal.  Cardiovascular:     Rate and Rhythm: Normal rate and regular rhythm.  Pulmonary:     Effort: Pulmonary effort is normal. No respiratory distress.     Breath sounds: Normal breath sounds.  Abdominal:     General: Abdomen is flat.     Tenderness: There is abdominal tenderness (mild diffuse tenderness).  Musculoskeletal:     Right lower leg: No edema.     Left lower leg: No edema.  Skin:    General: Skin is warm and dry.  Neurological:     Mental Status: She is alert and oriented to person, place, and time.     Comments: Chronic LE paresis   Psychiatric:        Mood and Affect: Mood normal.        Behavior: Behavior normal.     ED Results / Procedures / Treatments   Labs (all labs ordered are listed, but only abnormal results are displayed) Labs Reviewed  COMPREHENSIVE METABOLIC PANEL - Abnormal; Notable for the following components:      Result Value   Potassium 2.6 (*)    Glucose, Bld 106 (*)    BUN <5 (*)    Total Protein 8.2 (*)    AST 14 (*)    All other components within normal limits  CBC WITH DIFFERENTIAL/PLATELET - Abnormal; Notable for the following components:   Hemoglobin 9.0 (*)    HCT 29.5 (*)    MCV 71.8 (*)    MCH 21.9 (*)    RDW 16.9 (*)    Platelets 570 (*)    All other components within  normal limits  CULTURE, BLOOD (ROUTINE X 2)  CULTURE, BLOOD (ROUTINE X 2)  LACTIC ACID, PLASMA  PROTIME-INR  LACTIC ACID, PLASMA  URINALYSIS, ROUTINE W REFLEX MICROSCOPIC    EKG None  Radiology CT Abdomen Pelvis W Contrast  Result Date: 01/23/2022 CLINICAL DATA:  Acute nonlocalized abdominal pain EXAM: CT ABDOMEN  AND PELVIS WITH CONTRAST TECHNIQUE: Multidetector CT imaging of the abdomen and pelvis was performed using the standard protocol following bolus administration of intravenous contrast. RADIATION DOSE REDUCTION: This exam was performed according to the departmental dose-optimization program which includes automated exposure control, adjustment of the mA and/or kV according to patient size and/or use of iterative reconstruction technique. CONTRAST:  132m OMNIPAQUE IOHEXOL 300 MG/ML  SOLN COMPARISON:  CT abdomen and pelvis 09/17/2021 FINDINGS: Lower chest: No acute abnormality. Hepatobiliary: No suspicious focal liver abnormality is seen. No gallstones, gallbladder wall thickening, or biliary dilatation. Pancreas: Unremarkable. No pancreatic ductal dilatation or surrounding inflammatory changes. Spleen: Normal in size without focal abnormality. Adrenals/Urinary Tract: Adrenal glands are unremarkable. Kidneys are normal, without renal calculi, suspicious focal lesion, or hydronephrosis. Bladder is unremarkable. Stomach/Bowel: Stomach is within normal limits. The appendix is normal. No evidence of bowel wall thickening, distention, or inflammatory changes. Moderate stool burden in the distal colon and rectum. Vascular/Lymphatic: No significant vascular findings are present. No enlarged abdominal or pelvic lymph nodes. Reproductive: Unremarkable. Other: No free intraperitoneal fluid or air. Musculoskeletal: Rectus diastasis containing loops of nonobstructed small bowel and colon. No acute osseous abnormality. IMPRESSION: 1. No acute abnormality in the abdomen or pelvis. 2. Moderate stool burden in  the distal colon and rectum. 3. Rectus diastasis containing loops of nonobstructed small bowel and colon. Electronically Signed   By: TPlacido SouM.D.   On: 01/23/2022 16:26   DG Chest 2 View  Result Date: 01/23/2022 CLINICAL DATA:  Sepsis, generalized body pain, hyperventilation EXAM: CHEST - 2 VIEW COMPARISON:  09/17/2021 FINDINGS: Frontal and lateral views of the chest demonstrate an unremarkable cardiac silhouette. No acute airspace disease, effusion, or pneumothorax. No acute bony abnormalities. Stable shrapnel within the region the spinous processes at the cervicothoracic junction. IMPRESSION: 1. Stable chest, no acute process. Electronically Signed   By: MRanda NgoM.D.   On: 01/23/2022 15:28    Procedures .1-3 Lead EKG Interpretation  Performed by: SCristie Hem MD Authorized by: SCristie Hem MD     Interpretation: normal     ECG rate assessment: normal     Rhythm: sinus rhythm     Ectopy: none     Conduction: normal      Medications Ordered in ED Medications  magnesium sulfate IVPB 2 g 50 mL (2 g Intravenous New Bag/Given 01/23/22 1719)  lactated ringers bolus 1,000 mL (1,000 mLs Intravenous New Bag/Given 01/23/22 1511)  potassium chloride (KLOR-CON) packet 60 mEq (60 mEq Oral Given 01/23/22 1706)  iohexol (OMNIPAQUE) 300 MG/ML solution 100 mL (100 mLs Intravenous Contrast Given 01/23/22 1556)  HYDROcodone-acetaminophen (NORCO/VICODIN) 5-325 MG per tablet 1 tablet (1 tablet Oral Given 01/23/22 1706)    ED Course/ Medical Decision Making/ A&P Clinical Course as of 01/23/22 1734  Fri Jan 23, 2022  1733 Signed out to Dr. GPearline Cablespending K repletion, urinalysis.  [WS]    Clinical Course User Index [WS] SCristie Hem MD                           Medical Decision Making Amount and/or Complexity of Data Reviewed Labs: ordered. Radiology: ordered.  Risk Prescription drug management.   41year old female presenting to the emergency department with total  body pain.  Exam notable for abdominal tenderness.  Given abdominal tenderness CT scan obtained to evaluate for intra-abdominal process such as appendicitis, obstruction, cholecystitis, pancreatitis, perforation.  CT scan reassuring without evidence of  intra-abdominal pathology.  Given symptoms, suspect most likely causes urinary infection.  Has recurrent urinary tract infections, urine in Foley is cloudy.  Will change and send urine for analysis.  Gave medication for pain.  Gave fluids.  Blood culture sent in triage of the doubt patient is septic without fever, hypotension.  Suspect initial tachycardia related to pain.  Potassium incidentally noted to be low, will replete, give magnesium         Final Clinical Impression(s) / ED Diagnoses Final diagnoses:  None    Rx / DC Orders ED Discharge Orders     None         Cristie Hem, MD 01/23/22 1734

## 2022-01-24 DIAGNOSIS — M791 Myalgia, unspecified site: Secondary | ICD-10-CM | POA: Diagnosis not present

## 2022-01-24 MED ORDER — KETOROLAC TROMETHAMINE 15 MG/ML IJ SOLN
15.0000 mg | Freq: Once | INTRAMUSCULAR | Status: AC
  Administered 2022-01-24: 15 mg via INTRAVENOUS
  Filled 2022-01-24: qty 1

## 2022-01-24 NOTE — ED Notes (Signed)
Pt non compliant with equipment. Refuses to leave monitoring devices on.

## 2022-01-26 LAB — URINE CULTURE: Culture: >=100000 — AB

## 2022-01-27 ENCOUNTER — Telehealth: Payer: Self-pay

## 2022-01-27 ENCOUNTER — Telehealth (HOSPITAL_BASED_OUTPATIENT_CLINIC_OR_DEPARTMENT_OTHER): Payer: Self-pay | Admitting: Emergency Medicine

## 2022-01-27 NOTE — Telephone Encounter (Signed)
Patient calls nurse line regarding follow up from ED visit on 01/23/22.  She reports continued chills, body aches, nausea, weakness. She reports difficulty with sleep as she wakes up sweating. She was started on cephalexin on 01/23/22.   Denies fever.   Patient is requesting to speak with provider regarding next steps. We do not have any appointments until next week. Patient also has issues with transportation and coming into the office for appointments.   ED precautions discussed.   Talbot Grumbling, RN

## 2022-01-27 NOTE — Telephone Encounter (Signed)
Patient is calling because she is having severe chills and feels miserable. She stated she went to the hospital and was given fluids with electrolytes and an antibiotic. She states the hurts all over and and is having an issue with her bowels. She also stated she can't sleep. She needs to know what to do. Please advise.

## 2022-01-27 NOTE — Telephone Encounter (Signed)
Post ED Visit - Positive Culture Follow-up  Culture report reviewed by antimicrobial stewardship pharmacist: [place] Team '[]'$  Nathan Batchelder, Pharm.D. '[]'$  [address], Pharm.D., BCPS AQ-ID '[]'$  Heide Guile, Pharm.D., BCPS '[]'$  [place] Neptune, Pharm.D., BCPS '[]'$  [place], Pharm.D., BCPS, AAHIVP '[]'$  [place], Pharm.D., BCPS, AAHIVP '[]'$  Legrand Como, PharmD, BCPS '[]'$  Salome Arnt, PharmD, BCPS '[]'$  Johnnette Gourd, PharmD, BCPS '[]'$  Hughes Better, PharmD '[]'$  Leeroy Cha, PharmD, BCPS '[]'$  Laqueta Linden, PharmD  [place] Team '[]'$  [address], PharmD '[]'$  Leodis Sias, PharmD '[]'$  Lindell Spar, PharmD '[]'$  Royetta Asal, Rph '[]'$  Graylin Shiver) Rema Fendt, PharmD '[]'$  Glennon Mac, PharmD '[]'$  Arlyn Dunning, PharmD '[]'$  Netta Cedars, PharmD '[]'$  Dia Sitter, PharmD '[]'$  Leone Haven, PharmD '[]'$  Gretta Arab, PharmD '[]'$  Theodis Shove, PharmD '[]'$  Peggyann Juba, PharmD Reuel Boom PharmD   Positive urine culture Treated with cephalexin, organism sensitive to the same and no further patient follow-up is required at this time.  Jimmy Footman 01/27/2022, 9:47 AM

## 2022-01-28 LAB — CULTURE, BLOOD (ROUTINE X 2)
Culture: NO GROWTH
Culture: NO GROWTH
Special Requests: ADEQUATE
Special Requests: ADEQUATE

## 2022-01-28 NOTE — Telephone Encounter (Signed)
Spoke to patient and she states she called PCP and waiting on a return call from them. She also stated she is seeing Dr. Tivis Ringer on 8/18

## 2022-01-29 NOTE — Telephone Encounter (Signed)
Dear [facility] Team Please encourage her to make an appointment with me. Dorcas Mcmurray

## 2022-01-29 NOTE — Telephone Encounter (Signed)
PCP has no openings until 02/25/2022 should pt go out that long or should we double book PCP,  or put pt to see another provider. Bernd Crom Zimmerman Rumple, CMA

## 2022-02-03 NOTE — Telephone Encounter (Signed)
I would suggest booking an appointment with PCP and if she needs something urgent she can see a different provider before that for the acute issue Tracey Hall

## 2022-02-04 ENCOUNTER — Other Ambulatory Visit (HOSPITAL_COMMUNITY): Payer: Self-pay

## 2022-02-04 ENCOUNTER — Other Ambulatory Visit: Payer: Self-pay | Admitting: Physical Medicine and Rehabilitation

## 2022-02-05 ENCOUNTER — Other Ambulatory Visit (HOSPITAL_COMMUNITY): Payer: Self-pay

## 2022-02-05 NOTE — Telephone Encounter (Signed)
Placed a call to Tracey Hall,  No answer. Left message to return the call.  Dr Dagoberto Ligas note was reviewed.  Call was placed to check on patients suicidal ideation and blood pressure. Dr Dagoberto Ligas note was discussed with Dr Ranell Patrick, if Tracey. [place] states she is suicidal and refuses to contract for safety, we won't be able to prescribe. Her re-fill will be sent to Dr Dagoberto Ligas.  Awaiting on Tracey Hall return call.

## 2022-02-06 ENCOUNTER — Telehealth: Payer: Self-pay

## 2022-02-06 ENCOUNTER — Other Ambulatory Visit: Payer: Self-pay | Admitting: Family Medicine

## 2022-02-06 ENCOUNTER — Other Ambulatory Visit (HOSPITAL_COMMUNITY): Payer: Self-pay

## 2022-02-06 DIAGNOSIS — G8250 Quadriplegia, unspecified: Secondary | ICD-10-CM | POA: Diagnosis not present

## 2022-02-06 DIAGNOSIS — G950 Syringomyelia and syringobulbia: Secondary | ICD-10-CM | POA: Diagnosis not present

## 2022-02-06 DIAGNOSIS — M542 Cervicalgia: Secondary | ICD-10-CM | POA: Diagnosis not present

## 2022-02-06 NOTE — Telephone Encounter (Signed)
Patient stating she was returning a call and needs a refill on Gabapentin. I don't see that anybody called her and Gabapentin filled on 12/12/21 with 5 refills. Called patient left message.

## 2022-02-06 NOTE — Telephone Encounter (Signed)
LVM asking for a return call. Asked for her to call and schedule an appt with Dr. Nori Riis. Informed that if she is still having issues and would like to see someone sooner she is welcome to do that just let the person who answers the phone know that she would like to be seen by the first available provider.  Tracey Hall, CMA

## 2022-02-10 ENCOUNTER — Other Ambulatory Visit (HOSPITAL_COMMUNITY): Payer: Self-pay

## 2022-02-11 ENCOUNTER — Ambulatory Visit
Admission: EM | Admit: 2022-02-11 | Discharge: 2022-02-11 | Discharge status: Absent without leave | Payer: Medicare Other

## 2022-02-12 DIAGNOSIS — G950 Syringomyelia and syringobulbia: Secondary | ICD-10-CM | POA: Diagnosis not present

## 2022-02-12 DIAGNOSIS — G8250 Quadriplegia, unspecified: Secondary | ICD-10-CM | POA: Diagnosis not present

## 2022-02-13 ENCOUNTER — Other Ambulatory Visit: Payer: Self-pay | Admitting: Family Medicine

## 2022-02-13 ENCOUNTER — Other Ambulatory Visit: Payer: Self-pay | Admitting: Physical Medicine and Rehabilitation

## 2022-02-14 ENCOUNTER — Other Ambulatory Visit (HOSPITAL_COMMUNITY): Payer: Self-pay

## 2022-02-14 MED ORDER — MIDODRINE HCL 2.5 MG PO TABS
2.5000 mg | ORAL_TABLET | Freq: Three times a day (TID) | ORAL | 0 refills | Status: DC
  Filled 2022-02-14 – 2022-02-16 (×2): qty 30, 10d supply, fill #0

## 2022-02-16 ENCOUNTER — Other Ambulatory Visit (HOSPITAL_COMMUNITY): Payer: Self-pay

## 2022-02-16 MED ORDER — HYDROCODONE-ACETAMINOPHEN 5-325 MG PO TABS
1.0000 | ORAL_TABLET | Freq: Four times a day (QID) | ORAL | 0 refills | Status: DC | PRN
  Filled 2022-02-16: qty 90, 23d supply, fill #0

## 2022-02-17 ENCOUNTER — Other Ambulatory Visit (HOSPITAL_BASED_OUTPATIENT_CLINIC_OR_DEPARTMENT_OTHER): Payer: Self-pay

## 2022-02-17 ENCOUNTER — Other Ambulatory Visit (HOSPITAL_COMMUNITY): Payer: Self-pay
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
## 2022-02-25 ENCOUNTER — Ambulatory Visit: Payer: Medicare Other | Admitting: Neurology

## 2022-02-25 ENCOUNTER — Encounter: Payer: Self-pay | Admitting: Neurology

## 2022-02-25 DIAGNOSIS — Z029 Encounter for administrative examinations, unspecified: Secondary | ICD-10-CM

## 2022-03-07 ENCOUNTER — Other Ambulatory Visit (HOSPITAL_COMMUNITY): Payer: Self-pay

## 2022-03-07 ENCOUNTER — Other Ambulatory Visit: Payer: Self-pay | Admitting: Physical Medicine and Rehabilitation

## 2022-03-07 ENCOUNTER — Other Ambulatory Visit: Payer: Self-pay | Admitting: Family Medicine

## 2022-03-09 ENCOUNTER — Other Ambulatory Visit (HOSPITAL_COMMUNITY): Payer: Self-pay

## 2022-03-09 ENCOUNTER — Encounter: Payer: Self-pay | Admitting: Physical Medicine and Rehabilitation

## 2022-03-09 MED ORDER — HYDROCODONE-ACETAMINOPHEN 5-325 MG PO TABS
1.0000 | ORAL_TABLET | Freq: Four times a day (QID) | ORAL | 0 refills | Status: DC | PRN
  Filled 2022-03-09: qty 90, 23d supply, fill #0

## 2022-03-09 MED ORDER — MIDODRINE HCL 2.5 MG PO TABS
2.5000 mg | ORAL_TABLET | Freq: Three times a day (TID) | ORAL | 5 refills | Status: DC
Start: 2022-03-09 — End: 2022-05-08
  Filled 2022-03-09: qty 90, 30d supply, fill #0
  Filled 2022-03-25 – 2022-04-13 (×2): qty 90, 30d supply, fill #1
  Filled 2022-05-08: qty 51, 17d supply, fill #1
  Filled 2022-05-08: qty 39, 13d supply, fill #1

## 2022-03-09 NOTE — Telephone Encounter (Signed)
Can you please fill in Dr. Florentina Jenny absence? Per PMP, last fill was 02/16/22 for 23 days

## 2022-03-10 ENCOUNTER — Other Ambulatory Visit (HOSPITAL_COMMUNITY): Payer: Self-pay

## 2022-03-11 ENCOUNTER — Other Ambulatory Visit (HOSPITAL_COMMUNITY): Payer: Self-pay

## 2022-03-11 DIAGNOSIS — R109 Unspecified abdominal pain: Secondary | ICD-10-CM | POA: Diagnosis not present

## 2022-03-11 DIAGNOSIS — D649 Anemia, unspecified: Secondary | ICD-10-CM | POA: Diagnosis not present

## 2022-03-11 DIAGNOSIS — E876 Hypokalemia: Secondary | ICD-10-CM | POA: Diagnosis not present

## 2022-03-11 DIAGNOSIS — D500 Iron deficiency anemia secondary to blood loss (chronic): Secondary | ICD-10-CM | POA: Diagnosis not present

## 2022-03-11 DIAGNOSIS — Z01812 Encounter for preprocedural laboratory examination: Secondary | ICD-10-CM | POA: Diagnosis not present

## 2022-03-11 DIAGNOSIS — G8250 Quadriplegia, unspecified: Secondary | ICD-10-CM | POA: Diagnosis not present

## 2022-03-11 DIAGNOSIS — R531 Weakness: Secondary | ICD-10-CM | POA: Diagnosis not present

## 2022-03-11 DIAGNOSIS — F32A Depression, unspecified: Secondary | ICD-10-CM | POA: Diagnosis not present

## 2022-03-11 DIAGNOSIS — M79601 Pain in right arm: Secondary | ICD-10-CM | POA: Diagnosis not present

## 2022-03-12 ENCOUNTER — Other Ambulatory Visit (HOSPITAL_COMMUNITY): Payer: Self-pay

## 2022-03-18 DIAGNOSIS — Q068 Other specified congenital malformations of spinal cord: Secondary | ICD-10-CM | POA: Diagnosis not present

## 2022-03-18 DIAGNOSIS — D509 Iron deficiency anemia, unspecified: Secondary | ICD-10-CM | POA: Diagnosis not present

## 2022-03-18 DIAGNOSIS — Z79891 Long term (current) use of opiate analgesic: Secondary | ICD-10-CM | POA: Diagnosis not present

## 2022-03-18 DIAGNOSIS — Z87440 Personal history of urinary (tract) infections: Secondary | ICD-10-CM | POA: Diagnosis not present

## 2022-03-18 DIAGNOSIS — Z91040 Latex allergy status: Secondary | ICD-10-CM | POA: Diagnosis not present

## 2022-03-18 DIAGNOSIS — Z8711 Personal history of peptic ulcer disease: Secondary | ICD-10-CM | POA: Diagnosis not present

## 2022-03-18 DIAGNOSIS — B9620 Unspecified Escherichia coli [E. coli] as the cause of diseases classified elsewhere: Secondary | ICD-10-CM | POA: Diagnosis not present

## 2022-03-18 DIAGNOSIS — G8250 Quadriplegia, unspecified: Secondary | ICD-10-CM | POA: Diagnosis not present

## 2022-03-18 DIAGNOSIS — M542 Cervicalgia: Secondary | ICD-10-CM | POA: Diagnosis not present

## 2022-03-18 DIAGNOSIS — N390 Urinary tract infection, site not specified: Secondary | ICD-10-CM | POA: Diagnosis not present

## 2022-03-18 DIAGNOSIS — G8918 Other acute postprocedural pain: Secondary | ICD-10-CM | POA: Diagnosis not present

## 2022-03-18 DIAGNOSIS — Z79899 Other long term (current) drug therapy: Secondary | ICD-10-CM | POA: Diagnosis not present

## 2022-03-18 DIAGNOSIS — G950 Syringomyelia and syringobulbia: Secondary | ICD-10-CM | POA: Diagnosis not present

## 2022-03-18 DIAGNOSIS — R519 Headache, unspecified: Secondary | ICD-10-CM | POA: Diagnosis not present

## 2022-03-18 DIAGNOSIS — G8929 Other chronic pain: Secondary | ICD-10-CM | POA: Diagnosis not present

## 2022-03-18 DIAGNOSIS — Z7952 Long term (current) use of systemic steroids: Secondary | ICD-10-CM | POA: Diagnosis not present

## 2022-03-18 DIAGNOSIS — G904 Autonomic dysreflexia: Secondary | ICD-10-CM | POA: Diagnosis not present

## 2022-03-18 DIAGNOSIS — M791 Myalgia, unspecified site: Secondary | ICD-10-CM | POA: Diagnosis not present

## 2022-03-18 DIAGNOSIS — Z993 Dependence on wheelchair: Secondary | ICD-10-CM | POA: Diagnosis not present

## 2022-03-18 DIAGNOSIS — R0689 Other abnormalities of breathing: Secondary | ICD-10-CM | POA: Diagnosis not present

## 2022-03-18 DIAGNOSIS — Z1812 Retained nonmagnetic metal fragments: Secondary | ICD-10-CM | POA: Diagnosis not present

## 2022-03-18 DIAGNOSIS — Z888 Allergy status to other drugs, medicaments and biological substances status: Secondary | ICD-10-CM | POA: Diagnosis not present

## 2022-03-18 DIAGNOSIS — M2550 Pain in unspecified joint: Secondary | ICD-10-CM | POA: Diagnosis not present

## 2022-03-18 DIAGNOSIS — G903 Multi-system degeneration of the autonomic nervous system: Secondary | ICD-10-CM | POA: Diagnosis not present

## 2022-03-18 DIAGNOSIS — M549 Dorsalgia, unspecified: Secondary | ICD-10-CM | POA: Diagnosis not present

## 2022-03-18 DIAGNOSIS — R001 Bradycardia, unspecified: Secondary | ICD-10-CM | POA: Diagnosis not present

## 2022-03-25 ENCOUNTER — Other Ambulatory Visit (HOSPITAL_COMMUNITY): Payer: Self-pay

## 2022-03-25 ENCOUNTER — Other Ambulatory Visit: Payer: Self-pay | Admitting: Physical Medicine & Rehabilitation

## 2022-03-25 MED ORDER — HYDROCODONE-ACETAMINOPHEN 5-325 MG PO TABS
1.0000 | ORAL_TABLET | ORAL | 0 refills | Status: DC | PRN
  Filled 2022-03-25: qty 150, 13d supply, fill #0
  Filled 2022-04-13 – 2022-05-08 (×2): qty 150, 19d supply, fill #0

## 2022-03-26 ENCOUNTER — Other Ambulatory Visit (HOSPITAL_COMMUNITY): Payer: Self-pay

## 2022-03-27 ENCOUNTER — Other Ambulatory Visit (HOSPITAL_COMMUNITY): Payer: Self-pay

## 2022-03-28 DIAGNOSIS — Z9181 History of falling: Secondary | ICD-10-CM | POA: Diagnosis not present

## 2022-03-28 DIAGNOSIS — K279 Peptic ulcer, site unspecified, unspecified as acute or chronic, without hemorrhage or perforation: Secondary | ICD-10-CM | POA: Diagnosis not present

## 2022-03-28 DIAGNOSIS — D649 Anemia, unspecified: Secondary | ICD-10-CM | POA: Diagnosis not present

## 2022-03-28 DIAGNOSIS — F333 Major depressive disorder, recurrent, severe with psychotic symptoms: Secondary | ICD-10-CM | POA: Diagnosis not present

## 2022-03-28 DIAGNOSIS — Z960 Presence of urogenital implants: Secondary | ICD-10-CM | POA: Diagnosis not present

## 2022-03-28 DIAGNOSIS — N319 Neuromuscular dysfunction of bladder, unspecified: Secondary | ICD-10-CM | POA: Diagnosis not present

## 2022-03-28 DIAGNOSIS — Z982 Presence of cerebrospinal fluid drainage device: Secondary | ICD-10-CM | POA: Diagnosis not present

## 2022-03-28 DIAGNOSIS — E876 Hypokalemia: Secondary | ICD-10-CM | POA: Diagnosis not present

## 2022-03-28 DIAGNOSIS — R531 Weakness: Secondary | ICD-10-CM | POA: Diagnosis not present

## 2022-03-28 DIAGNOSIS — G8250 Quadriplegia, unspecified: Secondary | ICD-10-CM | POA: Diagnosis not present

## 2022-03-28 DIAGNOSIS — Z87440 Personal history of urinary (tract) infections: Secondary | ICD-10-CM | POA: Diagnosis not present

## 2022-03-28 DIAGNOSIS — G950 Syringomyelia and syringobulbia: Secondary | ICD-10-CM | POA: Diagnosis not present

## 2022-03-28 DIAGNOSIS — G9589 Other specified diseases of spinal cord: Secondary | ICD-10-CM | POA: Diagnosis not present

## 2022-03-28 DIAGNOSIS — F411 Generalized anxiety disorder: Secondary | ICD-10-CM | POA: Diagnosis not present

## 2022-03-28 DIAGNOSIS — G894 Chronic pain syndrome: Secondary | ICD-10-CM | POA: Diagnosis not present

## 2022-03-28 DIAGNOSIS — Z79891 Long term (current) use of opiate analgesic: Secondary | ICD-10-CM | POA: Diagnosis not present

## 2022-03-28 DIAGNOSIS — M542 Cervicalgia: Secondary | ICD-10-CM | POA: Diagnosis not present

## 2022-03-28 DIAGNOSIS — G43019 Migraine without aura, intractable, without status migrainosus: Secondary | ICD-10-CM | POA: Diagnosis not present

## 2022-03-28 DIAGNOSIS — N119 Chronic tubulo-interstitial nephritis, unspecified: Secondary | ICD-10-CM | POA: Diagnosis not present

## 2022-03-28 DIAGNOSIS — Z4789 Encounter for other orthopedic aftercare: Secondary | ICD-10-CM | POA: Diagnosis not present

## 2022-03-28 DIAGNOSIS — Y249XXS Unspecified firearm discharge, undetermined intent, sequela: Secondary | ICD-10-CM | POA: Diagnosis not present

## 2022-03-28 DIAGNOSIS — J45909 Unspecified asthma, uncomplicated: Secondary | ICD-10-CM | POA: Diagnosis not present

## 2022-03-28 DIAGNOSIS — G904 Autonomic dysreflexia: Secondary | ICD-10-CM | POA: Diagnosis not present

## 2022-04-12 ENCOUNTER — Other Ambulatory Visit: Payer: Self-pay | Admitting: Family Medicine

## 2022-04-13 ENCOUNTER — Other Ambulatory Visit: Payer: Self-pay | Admitting: Physical Medicine and Rehabilitation

## 2022-04-13 ENCOUNTER — Other Ambulatory Visit (HOSPITAL_COMMUNITY): Payer: Self-pay

## 2022-04-14 ENCOUNTER — Encounter: Payer: Self-pay | Admitting: Family Medicine

## 2022-04-23 ENCOUNTER — Other Ambulatory Visit (HOSPITAL_COMMUNITY): Payer: Self-pay

## 2022-04-24 ENCOUNTER — Encounter: Payer: Medicare Other | Admitting: Physical Medicine and Rehabilitation

## 2022-05-04 ENCOUNTER — Other Ambulatory Visit: Payer: Self-pay | Admitting: Physical Medicine and Rehabilitation

## 2022-05-08 ENCOUNTER — Other Ambulatory Visit: Payer: Self-pay | Admitting: Physical Medicine and Rehabilitation

## 2022-05-08 ENCOUNTER — Other Ambulatory Visit: Payer: Self-pay

## 2022-05-08 ENCOUNTER — Other Ambulatory Visit (HOSPITAL_COMMUNITY): Payer: Self-pay

## 2022-05-08 ENCOUNTER — Other Ambulatory Visit: Payer: Self-pay | Admitting: Family Medicine

## 2022-05-08 MED ORDER — MIDODRINE HCL 2.5 MG PO TABS: 2.5000 mg | ORAL_TABLET | Freq: Three times a day (TID) | ORAL | 5 refills | Status: DC

## 2022-05-11 ENCOUNTER — Other Ambulatory Visit: Payer: Self-pay | Admitting: Family Medicine

## 2022-06-11 ENCOUNTER — Other Ambulatory Visit: Payer: Self-pay | Admitting: Physical Medicine and Rehabilitation

## 2022-06-11 NOTE — Telephone Encounter (Signed)
[facility] Called She already has Florinef prescription that was filled on 06/06/2022. She hasn't pick up the medication. Robbin MA called Ms. Owens Shark.  She was instructed to go to the Emergency room for evaluation for her  complaints of dizziness, and  and fainting spells.She verbalizes understanding.

## 2022-06-11 NOTE — Telephone Encounter (Signed)
I have attempted to reach Tracey Hall twice this morning. A generic voicemail has been left for a call back to the office ASAP. (Patient does not have voice identification on her phone).

## 2022-06-11 NOTE — Telephone Encounter (Signed)
Tracey Hall reported taking the Florinef once or twice daily. She is not sure of how often because her husband prepares her medications. She is still having dizzy spells and passing out. But is not checking her blood pressure. Tracey Hall also stated she had spinal surgery a few months ago, at [facility]. She  was given the Florinef in the hospital and at discharge.

## 2022-06-11 NOTE — Telephone Encounter (Signed)
Last Office visit is in July 2023.  Can you call patient to see what her blood pressure is running.  Dr Dagoberto Ligas note was reviewed.

## 2022-06-16 ENCOUNTER — Other Ambulatory Visit: Payer: Self-pay

## 2022-06-16 ENCOUNTER — Other Ambulatory Visit: Payer: Self-pay | Admitting: Physical Medicine and Rehabilitation

## 2022-06-16 ENCOUNTER — Encounter: Payer: Self-pay | Admitting: Physical Medicine and Rehabilitation

## 2022-06-16 ENCOUNTER — Other Ambulatory Visit (HOSPITAL_BASED_OUTPATIENT_CLINIC_OR_DEPARTMENT_OTHER): Payer: Self-pay

## 2022-06-16 ENCOUNTER — Other Ambulatory Visit (HOSPITAL_COMMUNITY): Payer: Self-pay

## 2022-06-16 MED ORDER — HYDROCODONE-ACETAMINOPHEN 5-325 MG PO TABS
1.0000 | ORAL_TABLET | ORAL | 0 refills | Status: DC | PRN
  Filled 2022-06-16 (×3): qty 150, 19d supply, fill #0

## 2022-06-17 ENCOUNTER — Other Ambulatory Visit: Payer: Self-pay

## 2022-06-17 ENCOUNTER — Other Ambulatory Visit (HOSPITAL_COMMUNITY): Payer: Self-pay

## 2022-06-18 ENCOUNTER — Other Ambulatory Visit: Payer: Self-pay

## 2022-06-30 ENCOUNTER — Emergency Department (HOSPITAL_COMMUNITY): Payer: Medicare Other

## 2022-06-30 ENCOUNTER — Emergency Department (HOSPITAL_COMMUNITY)
Admission: EM | Admit: 2022-06-30 | Discharge: 2022-07-01 | Disposition: A | Discharge status: Home / Self Care | Payer: Medicare Other | Attending: Emergency Medicine | Admitting: Emergency Medicine

## 2022-06-30 ENCOUNTER — Encounter (HOSPITAL_COMMUNITY): Payer: Self-pay

## 2022-06-30 DIAGNOSIS — R404 Transient alteration of awareness: Secondary | ICD-10-CM | POA: Insufficient documentation

## 2022-06-30 DIAGNOSIS — R519 Headache, unspecified: Secondary | ICD-10-CM | POA: Diagnosis not present

## 2022-06-30 DIAGNOSIS — E860 Dehydration: Secondary | ICD-10-CM | POA: Insufficient documentation

## 2022-06-30 DIAGNOSIS — Z91040 Latex allergy status: Secondary | ICD-10-CM | POA: Insufficient documentation

## 2022-06-30 DIAGNOSIS — G8250 Quadriplegia, unspecified: Secondary | ICD-10-CM | POA: Diagnosis not present

## 2022-06-30 DIAGNOSIS — G4489 Other headache syndrome: Secondary | ICD-10-CM | POA: Diagnosis not present

## 2022-06-30 DIAGNOSIS — R000 Tachycardia, unspecified: Secondary | ICD-10-CM | POA: Diagnosis not present

## 2022-06-30 DIAGNOSIS — M160 Bilateral primary osteoarthritis of hip: Secondary | ICD-10-CM | POA: Diagnosis not present

## 2022-06-30 DIAGNOSIS — R4182 Altered mental status, unspecified: Secondary | ICD-10-CM | POA: Diagnosis not present

## 2022-06-30 DIAGNOSIS — G43909 Migraine, unspecified, not intractable, without status migrainosus: Secondary | ICD-10-CM | POA: Diagnosis not present

## 2022-06-30 LAB — URINALYSIS, ROUTINE W REFLEX MICROSCOPIC
Bilirubin Urine: NEGATIVE
Glucose, UA: NEGATIVE mg/dL
Hgb urine dipstick: NEGATIVE
Ketones, ur: NEGATIVE mg/dL
Nitrite: POSITIVE — AB
Protein, ur: NEGATIVE mg/dL
Specific Gravity, Urine: 1.009 (ref 1.005–1.030)
pH: 5 (ref 5.0–8.0)

## 2022-06-30 LAB — MAGNESIUM: Magnesium: 2.2 mg/dL (ref 1.7–2.4)

## 2022-06-30 LAB — COMPREHENSIVE METABOLIC PANEL
ALT: 14 U/L (ref 0–44)
AST: 17 U/L (ref 15–41)
Albumin: 4.2 g/dL (ref 3.5–5.0)
Alkaline Phosphatase: 97 U/L (ref 38–126)
Anion gap: 10 (ref 5–15)
BUN: 9 mg/dL (ref 6–20)
CO2: 25 mmol/L (ref 22–32)
Calcium: 8.9 mg/dL (ref 8.9–10.3)
Chloride: 100 mmol/L (ref 98–111)
Creatinine, Ser: 0.43 mg/dL — ABNORMAL LOW (ref 0.44–1.00)
GFR, Estimated: >60 mL/min (ref >60)
Glucose, Bld: 81 mg/dL (ref 70–99)
Potassium: 2.8 mmol/L — ABNORMAL LOW (ref 3.5–5.1)
Sodium: 135 mmol/L (ref 135–145)
Total Bilirubin: 0.2 mg/dL — ABNORMAL LOW (ref 0.3–1.2)
Total Protein: 8.5 g/dL — ABNORMAL HIGH (ref 6.5–8.1)

## 2022-06-30 LAB — CBC WITH DIFFERENTIAL/PLATELET
Abs Immature Granulocytes: 0.03 10*3/uL (ref 0.00–0.07)
Basophils Absolute: 0 10*3/uL (ref 0.0–0.1)
Basophils Relative: 0 %
Eosinophils Absolute: 0.1 10*3/uL (ref 0.0–0.5)
Eosinophils Relative: 1 %
HCT: 27.9 % — ABNORMAL LOW (ref 36.0–46.0)
Hemoglobin: 8.4 g/dL — ABNORMAL LOW (ref 12.0–15.0)
Immature Granulocytes: 0 %
Lymphocytes Relative: 34 %
Lymphs Abs: 3.2 10*3/uL (ref 0.7–4.0)
MCH: 21.3 pg — ABNORMAL LOW (ref 26.0–34.0)
MCHC: 30.1 g/dL (ref 30.0–36.0)
MCV: 70.6 fL — ABNORMAL LOW (ref 80.0–100.0)
Monocytes Absolute: 0.5 10*3/uL (ref 0.1–1.0)
Monocytes Relative: 5 %
Neutro Abs: 5.6 10*3/uL (ref 1.7–7.7)
Neutrophils Relative %: 60 %
Platelets: 457 10*3/uL — ABNORMAL HIGH (ref 150–400)
RBC: 3.95 MIL/uL (ref 3.87–5.11)
RDW: 18.4 % — ABNORMAL HIGH (ref 11.5–15.5)
WBC: 9.4 10*3/uL (ref 4.0–10.5)
nRBC: 0 % (ref 0.0–0.2)

## 2022-06-30 LAB — LACTIC ACID, PLASMA: Lactic Acid, Venous: 1.5 mmol/L (ref 0.5–1.9)

## 2022-06-30 MED ORDER — LACTATED RINGERS IV BOLUS
1000.0000 mL | Freq: Once | INTRAVENOUS | Status: AC
  Administered 2022-06-30: 1000 mL via INTRAVENOUS

## 2022-06-30 MED ORDER — DIAZEPAM 5 MG PO TABS
5.0000 mg | ORAL_TABLET | Freq: Once | ORAL | Status: AC
  Administered 2022-06-30: 5 mg via ORAL
  Filled 2022-06-30: qty 1

## 2022-06-30 MED ORDER — LACTATED RINGERS IV SOLN: INTRAVENOUS | Status: DC

## 2022-06-30 MED ORDER — MORPHINE SULFATE (PF) 4 MG/ML IV SOLN
4.0000 mg | Freq: Once | INTRAVENOUS | Status: AC
  Administered 2022-06-30: 4 mg via INTRAVENOUS
  Filled 2022-06-30: qty 1

## 2022-06-30 MED ORDER — POTASSIUM CHLORIDE 10 MEQ/100ML IV SOLN
10.0000 meq | Freq: Once | INTRAVENOUS | Status: AC
  Administered 2022-06-30: 10 meq via INTRAVENOUS
  Filled 2022-06-30: qty 100

## 2022-06-30 MED ORDER — METOCLOPRAMIDE HCL 5 MG/ML IJ SOLN
10.0000 mg | Freq: Once | INTRAMUSCULAR | Status: AC
  Administered 2022-06-30: 10 mg via INTRAVENOUS
  Filled 2022-06-30: qty 2

## 2022-06-30 MED ORDER — QUETIAPINE FUMARATE 50 MG PO TABS
25.0000 mg | ORAL_TABLET | Freq: Once | ORAL | Status: AC
  Administered 2022-06-30: 25 mg via ORAL
  Filled 2022-06-30: qty 1

## 2022-06-30 MED ORDER — GABAPENTIN 300 MG PO CAPS
600.0000 mg | ORAL_CAPSULE | Freq: Once | ORAL | Status: AC
  Administered 2022-06-30: 600 mg via ORAL
  Filled 2022-06-30: qty 2

## 2022-06-30 NOTE — ED Notes (Signed)
Patient transported to CT 

## 2022-06-30 NOTE — ED Notes (Signed)
Unable to obtain IV access.
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
## 2022-06-30 NOTE — Discharge Instructions (Addendum)
Your labs today all look within normal limits.  You do not have signs of urinary tract infection today and the CAT scan of your brain does not show any sign of strokes or bleeding.  You did appear to be dehydrated today and your blood pressure improved with fluids.  Continue your home medications for pain and follow-up with your doctor for the ongoing headaches.
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
## 2022-07-01 DIAGNOSIS — R404 Transient alteration of awareness: Secondary | ICD-10-CM | POA: Diagnosis not present

## 2022-07-01 DIAGNOSIS — Z743 Need for continuous supervision: Secondary | ICD-10-CM | POA: Diagnosis not present

## 2022-07-02 DIAGNOSIS — G8250 Quadriplegia, unspecified: Secondary | ICD-10-CM | POA: Diagnosis not present

## 2022-07-02 DIAGNOSIS — Z9889 Other specified postprocedural states: Secondary | ICD-10-CM | POA: Diagnosis not present

## 2022-07-02 DIAGNOSIS — G950 Syringomyelia and syringobulbia: Secondary | ICD-10-CM | POA: Diagnosis not present

## 2022-07-02 DIAGNOSIS — Q068 Other specified congenital malformations of spinal cord: Secondary | ICD-10-CM | POA: Diagnosis not present

## 2022-07-12 ENCOUNTER — Other Ambulatory Visit: Payer: Self-pay | Admitting: Physical Medicine and Rehabilitation

## 2022-07-16 ENCOUNTER — Other Ambulatory Visit: Payer: Self-pay | Admitting: Physical Medicine & Rehabilitation

## 2022-07-16 ENCOUNTER — Other Ambulatory Visit: Payer: Self-pay | Admitting: Physical Medicine and Rehabilitation

## 2022-07-16 ENCOUNTER — Other Ambulatory Visit: Payer: Self-pay | Admitting: Family Medicine

## 2022-07-17 ENCOUNTER — Other Ambulatory Visit: Payer: Self-pay

## 2022-07-17 ENCOUNTER — Other Ambulatory Visit (HOSPITAL_COMMUNITY): Payer: Self-pay

## 2022-07-17 MED ORDER — DIAZEPAM 5 MG PO TABS
5.0000 mg | ORAL_TABLET | Freq: Two times a day (BID) | ORAL | 3 refills | Status: DC
  Filled 2022-07-17: qty 60, 30d supply, fill #0

## 2022-07-17 MED ORDER — HYDROCODONE-ACETAMINOPHEN 5-325 MG PO TABS
1.0000 | ORAL_TABLET | ORAL | 0 refills | Status: DC | PRN
  Filled 2022-07-17: qty 150, 13d supply, fill #0

## 2022-07-20 ENCOUNTER — Telehealth: Payer: Self-pay | Admitting: Family Medicine

## 2022-07-20 NOTE — Telephone Encounter (Signed)
Left message for patient to call back and schedule Medicare Annual Wellness Visit (AWV) in office.  ° °If not able to come in office, please offer to do virtually or by telephone.  Left office number and my jabber #[phone]. ° °AWVI eligible as of 06/22/2009 ° °Please schedule at anytime with Nurse Health Advisor. °  °

## 2022-07-22 ENCOUNTER — Telehealth: Payer: Self-pay

## 2022-07-22 NOTE — Telephone Encounter (Signed)
I guess Quality limit PA- thanks- ML

## 2022-07-22 NOTE — Telephone Encounter (Signed)
Letter from insurance stating Hydrocodone/APAP has quantity limits. Will have to do a quantity limits PA or alternate medication

## 2022-07-24 ENCOUNTER — Encounter: Payer: Medicare Other | Admitting: Physical Medicine and Rehabilitation

## 2022-07-30 NOTE — Telephone Encounter (Signed)
Called insurance and patient is able to get #240 for 30 day. Rep ran the coverage for #150 and patient can get at a $0 copay

## 2022-08-14 ENCOUNTER — Other Ambulatory Visit: Payer: Self-pay | Admitting: Family Medicine

## 2022-08-14 ENCOUNTER — Other Ambulatory Visit: Payer: Self-pay | Admitting: Physical Medicine & Rehabilitation

## 2022-08-15 ENCOUNTER — Other Ambulatory Visit: Payer: Self-pay | Admitting: Physical Medicine and Rehabilitation

## 2022-08-19 ENCOUNTER — Other Ambulatory Visit (HOSPITAL_COMMUNITY): Payer: Self-pay

## 2022-08-19 MED ORDER — HYDROCODONE-ACETAMINOPHEN 5-325 MG PO TABS
1.0000 | ORAL_TABLET | ORAL | 0 refills | Status: DC | PRN
  Filled 2022-08-19: qty 150, 13d supply, fill #0

## 2022-08-20 ENCOUNTER — Other Ambulatory Visit: Payer: Self-pay

## 2022-08-26 ENCOUNTER — Other Ambulatory Visit: Payer: Self-pay | Admitting: Physical Medicine and Rehabilitation

## 2022-08-29 ENCOUNTER — Other Ambulatory Visit (HOSPITAL_COMMUNITY): Payer: Self-pay

## 2022-09-13 ENCOUNTER — Other Ambulatory Visit: Payer: Self-pay | Admitting: Physical Medicine and Rehabilitation

## 2022-09-14 ENCOUNTER — Other Ambulatory Visit: Payer: Self-pay | Admitting: Family Medicine

## 2022-09-14 ENCOUNTER — Encounter
Payer: Medicare Other | Attending: Physical Medicine and Rehabilitation | Admitting: Physical Medicine and Rehabilitation

## 2022-09-14 ENCOUNTER — Other Ambulatory Visit: Payer: Self-pay | Admitting: Physical Medicine and Rehabilitation

## 2022-09-14 ENCOUNTER — Encounter: Payer: Self-pay | Admitting: Physical Medicine and Rehabilitation

## 2022-09-14 VITALS — BP 101/59 | HR 79 | Ht 65.0 in

## 2022-09-14 DIAGNOSIS — Z978 Presence of other specified devices: Secondary | ICD-10-CM | POA: Insufficient documentation

## 2022-09-14 DIAGNOSIS — R252 Cramp and spasm: Secondary | ICD-10-CM | POA: Insufficient documentation

## 2022-09-14 DIAGNOSIS — M792 Neuralgia and neuritis, unspecified: Secondary | ICD-10-CM | POA: Insufficient documentation

## 2022-09-14 DIAGNOSIS — G4452 New daily persistent headache (NDPH): Secondary | ICD-10-CM | POA: Insufficient documentation

## 2022-09-14 DIAGNOSIS — G8250 Quadriplegia, unspecified: Secondary | ICD-10-CM | POA: Insufficient documentation

## 2022-09-14 DIAGNOSIS — H538 Other visual disturbances: Secondary | ICD-10-CM | POA: Insufficient documentation

## 2022-09-14 DIAGNOSIS — Z79891 Long term (current) use of opiate analgesic: Secondary | ICD-10-CM | POA: Diagnosis not present

## 2022-09-14 DIAGNOSIS — Z5181 Encounter for therapeutic drug level monitoring: Secondary | ICD-10-CM | POA: Insufficient documentation

## 2022-09-14 DIAGNOSIS — G43019 Migraine without aura, intractable, without status migrainosus: Secondary | ICD-10-CM | POA: Diagnosis not present

## 2022-09-14 DIAGNOSIS — G894 Chronic pain syndrome: Secondary | ICD-10-CM | POA: Insufficient documentation

## 2022-09-14 MED ORDER — DULOXETINE HCL 60 MG PO CPEP: 60.0000 mg | ORAL_CAPSULE | Freq: Every day | ORAL | 5 refills | Status: DC

## 2022-09-14 MED ORDER — GABAPENTIN 600 MG PO TABS: 600.0000 mg | ORAL_TABLET | Freq: Three times a day (TID) | ORAL | 5 refills | Status: DC

## 2022-09-14 MED ORDER — LIDOCAINE 5 % EX PTCH: 3.0000 | MEDICATED_PATCH | CUTANEOUS | 5 refills | Status: DC
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
## 2022-09-14 NOTE — Addendum Note (Signed)
Addended by: Franchot Gallo on: 09/14/2022 03:56 PM   Modules accepted: Orders
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
## 2022-09-15 ENCOUNTER — Other Ambulatory Visit: Payer: Self-pay

## 2022-09-15 ENCOUNTER — Other Ambulatory Visit (HOSPITAL_COMMUNITY): Payer: Self-pay

## 2022-09-15 MED ORDER — HYDROCODONE-ACETAMINOPHEN 5-325 MG PO TABS
1.0000 | ORAL_TABLET | ORAL | 0 refills | Status: DC | PRN
  Filled 2022-09-15 – 2022-10-06 (×3): qty 150, 13d supply, fill #0
  Filled 2022-10-06: qty 150, 30d supply, fill #0
  Filled 2022-10-06: qty 150, 13d supply, fill #0

## 2022-09-16 ENCOUNTER — Other Ambulatory Visit: Payer: Self-pay

## 2022-09-16 ENCOUNTER — Telehealth: Payer: Self-pay

## 2022-09-16 ENCOUNTER — Telehealth: Payer: Self-pay | Admitting: *Deleted

## 2022-09-16 NOTE — Telephone Encounter (Signed)
Denied. This drug used for CHRONIC PAIN SYNDROME is not an approved use. Medicare Part D rules states the drug must be used for a "medically-accepted indication". A "medically-accepted indication" means a use that is approved by the Food and Drug Administration (FDA), or a use supported by specific resources. These are the [facility] Formulary Service Drug Information and [place]. Therefore, this drug cannot be covered under your Medicare Part D benefit.   Pt aware

## 2022-09-16 NOTE — Telephone Encounter (Signed)
Lidocaine patch 5% denied. [employer] to fax appeal forms. It will take up to 7 days to review after forms are returned.

## 2022-09-17 ENCOUNTER — Other Ambulatory Visit: Payer: Self-pay

## 2022-09-17 ENCOUNTER — Other Ambulatory Visit (HOSPITAL_COMMUNITY): Payer: Self-pay

## 2022-09-17 ENCOUNTER — Other Ambulatory Visit: Payer: Self-pay | Admitting: Physical Medicine and Rehabilitation

## 2022-09-17 LAB — DRUG TOX MONITOR 1 W/CONF, ORAL FLD
Alprazolam: NEGATIVE ng/mL (ref <0.50)
Amphetamines: NEGATIVE ng/mL (ref <10)
Barbiturates: NEGATIVE ng/mL (ref <10)
Benzodiazepines: POSITIVE ng/mL — AB (ref <0.50)
Buprenorphine: NEGATIVE ng/mL (ref <0.10)
Chlordiazepoxide: NEGATIVE ng/mL (ref <0.50)
Clonazepam: NEGATIVE ng/mL (ref <0.50)
Cocaine: NEGATIVE ng/mL (ref <5.0)
Codeine: NEGATIVE ng/mL (ref <2.5)
Diazepam: 0.76 ng/mL — ABNORMAL HIGH (ref <0.50)
Dihydrocodeine: 3.3 ng/mL — ABNORMAL HIGH (ref <2.5)
Fentanyl: NEGATIVE ng/mL (ref <0.10)
Flunitrazepam: NEGATIVE ng/mL (ref <0.50)
Flurazepam: NEGATIVE ng/mL (ref <0.50)
Heroin Metabolite: NEGATIVE ng/mL (ref <1.0)
Hydrocodone: 36.6 ng/mL — ABNORMAL HIGH (ref <2.5)
Hydromorphone: NEGATIVE ng/mL (ref <2.5)
Lorazepam: NEGATIVE ng/mL (ref <0.50)
MARIJUANA: NEGATIVE ng/mL (ref <2.5)
MDMA: NEGATIVE ng/mL (ref <10)
Meprobamate: NEGATIVE ng/mL (ref <2.5)
Methadone: NEGATIVE ng/mL (ref <5.0)
Midazolam: NEGATIVE ng/mL (ref <0.50)
Morphine: NEGATIVE ng/mL (ref <2.5)
Nicotine Metabolite: NEGATIVE ng/mL (ref <5.0)
Nordiazepam: 2.1 ng/mL — ABNORMAL HIGH (ref <0.50)
Norhydrocodone: NEGATIVE ng/mL (ref <2.5)
Noroxycodone: NEGATIVE ng/mL (ref <2.5)
Opiates: POSITIVE ng/mL — AB (ref <2.5)
Oxazepam: NEGATIVE ng/mL (ref <0.50)
Oxycodone: NEGATIVE ng/mL (ref <2.5)
Oxymorphone: NEGATIVE ng/mL (ref <2.5)
Phencyclidine: NEGATIVE ng/mL (ref <10)
Tapentadol: NEGATIVE ng/mL (ref <5.0)
Temazepam: NEGATIVE ng/mL (ref <0.50)
Tramadol: NEGATIVE ng/mL (ref <5.0)
Triazolam: NEGATIVE ng/mL (ref <0.50)
Zolpidem: NEGATIVE ng/mL (ref <5.0)

## 2022-09-17 LAB — DRUG TOX ALC METAB W/CON, ORAL FLD: Alcohol Metabolite: NEGATIVE ng/mL (ref <25)

## 2022-09-18 ENCOUNTER — Other Ambulatory Visit: Payer: Self-pay | Admitting: Physical Medicine and Rehabilitation

## 2022-09-18 ENCOUNTER — Other Ambulatory Visit: Payer: Self-pay

## 2022-09-21 ENCOUNTER — Other Ambulatory Visit (HOSPITAL_COMMUNITY): Payer: Self-pay

## 2022-09-21 MED ORDER — DIAZEPAM 5 MG PO TABS
5.0000 mg | ORAL_TABLET | Freq: Two times a day (BID) | ORAL | 5 refills | Status: DC | PRN
  Filled 2022-09-21 – 2022-11-05 (×3): qty 60, 30d supply, fill #0
  Filled 2022-12-01: qty 60, 30d supply, fill #1
  Filled 2023-01-23: qty 60, 30d supply, fill #2
  Filled 2023-02-13 – 2023-02-17 (×3): qty 60, 30d supply, fill #3
  Filled 2023-03-14: qty 60, 30d supply, fill #4

## 2022-09-22 ENCOUNTER — Other Ambulatory Visit (HOSPITAL_COMMUNITY): Payer: Self-pay

## 2022-09-22 ENCOUNTER — Telehealth: Payer: Self-pay | Admitting: *Deleted

## 2022-09-22 NOTE — Telephone Encounter (Signed)
Oral swab drug screen was consistent for prescribed medications.  ?

## 2022-09-23 ENCOUNTER — Other Ambulatory Visit: Payer: Self-pay | Admitting: Physical Medicine and Rehabilitation

## 2022-09-23 ENCOUNTER — Other Ambulatory Visit (HOSPITAL_COMMUNITY): Payer: Self-pay

## 2022-09-23 ENCOUNTER — Other Ambulatory Visit: Payer: Self-pay

## 2022-09-23 MED ORDER — TOPIRAMATE 25 MG PO TABS
25.0000 mg | ORAL_TABLET | Freq: Every day | ORAL | 1 refills | Status: DC
  Filled 2022-09-23: qty 90, 90d supply, fill #0
  Filled 2022-11-05 – 2022-12-01 (×2): qty 90, 90d supply, fill #1

## 2022-09-23 MED ORDER — TRAZODONE HCL 50 MG PO TABS
50.0000 mg | ORAL_TABLET | Freq: Every day | ORAL | 1 refills | Status: DC
  Filled 2022-09-23: qty 180, 90d supply, fill #0

## 2022-09-23 MED ORDER — TIZANIDINE HCL 4 MG PO TABS
4.0000 mg | ORAL_TABLET | Freq: Three times a day (TID) | ORAL | 1 refills | Status: DC
  Filled 2022-09-23 – 2022-11-05 (×3): qty 270, 90d supply, fill #0
  Filled 2022-12-01 – 2023-01-23 (×2): qty 270, 90d supply, fill #1

## 2022-09-24 ENCOUNTER — Other Ambulatory Visit (HOSPITAL_COMMUNITY): Payer: Self-pay

## 2022-09-26 ENCOUNTER — Other Ambulatory Visit (HOSPITAL_COMMUNITY): Payer: Self-pay

## 2022-09-28 ENCOUNTER — Other Ambulatory Visit (HOSPITAL_COMMUNITY): Payer: Self-pay

## 2022-09-29 ENCOUNTER — Other Ambulatory Visit (HOSPITAL_COMMUNITY): Payer: Self-pay

## 2022-09-29 ENCOUNTER — Other Ambulatory Visit: Payer: Self-pay

## 2022-09-30 ENCOUNTER — Other Ambulatory Visit (HOSPITAL_COMMUNITY): Payer: Self-pay

## 2022-10-05 ENCOUNTER — Other Ambulatory Visit: Payer: Self-pay

## 2022-10-06 ENCOUNTER — Other Ambulatory Visit (HOSPITAL_COMMUNITY): Payer: Self-pay

## 2022-10-07 ENCOUNTER — Other Ambulatory Visit: Payer: Self-pay

## 2022-10-11 ENCOUNTER — Other Ambulatory Visit: Payer: Self-pay | Admitting: Physical Medicine and Rehabilitation

## 2022-10-12 ENCOUNTER — Other Ambulatory Visit: Payer: Self-pay | Admitting: Physical Medicine and Rehabilitation

## 2022-10-13 ENCOUNTER — Telehealth: Payer: Self-pay

## 2022-10-13 NOTE — Telephone Encounter (Signed)
Patient called to report she has an Librarian, academic appointment on Wednesday. And would like call back from Dr. Berline Chough or [place].  Patient has been called back twice with no answer. Message has been left to call back with more details.  Call back phone [phone].

## 2022-10-15 ENCOUNTER — Other Ambulatory Visit: Payer: Self-pay | Admitting: Physical Medicine and Rehabilitation

## 2022-10-15 ENCOUNTER — Encounter: Payer: Self-pay | Admitting: Physical Medicine and Rehabilitation

## 2022-10-15 ENCOUNTER — Other Ambulatory Visit: Payer: Self-pay

## 2022-10-15 DIAGNOSIS — H538 Other visual disturbances: Secondary | ICD-10-CM

## 2022-11-05 ENCOUNTER — Other Ambulatory Visit: Payer: Self-pay

## 2022-11-05 ENCOUNTER — Other Ambulatory Visit: Payer: Self-pay | Admitting: Physical Medicine and Rehabilitation

## 2022-11-06 ENCOUNTER — Other Ambulatory Visit (HOSPITAL_COMMUNITY): Payer: Self-pay

## 2022-11-06 ENCOUNTER — Other Ambulatory Visit: Payer: Self-pay

## 2022-11-06 MED ORDER — HYDROCODONE-ACETAMINOPHEN 5-325 MG PO TABS
1.0000 | ORAL_TABLET | ORAL | 0 refills | Status: DC | PRN
  Filled 2022-11-06: qty 150, 19d supply, fill #0

## 2022-11-09 ENCOUNTER — Other Ambulatory Visit: Payer: Self-pay | Admitting: Physical Medicine and Rehabilitation

## 2022-11-09 ENCOUNTER — Other Ambulatory Visit: Payer: Self-pay | Admitting: Family Medicine

## 2022-11-13 ENCOUNTER — Ambulatory Visit: Payer: Medicare Other | Admitting: Neurology

## 2022-11-13 ENCOUNTER — Telehealth: Payer: Self-pay

## 2022-11-13 NOTE — Telephone Encounter (Signed)
PA for Hydrocodone/APAP denied

## 2022-12-01 ENCOUNTER — Other Ambulatory Visit: Payer: Self-pay | Admitting: Physical Medicine and Rehabilitation

## 2022-12-01 ENCOUNTER — Other Ambulatory Visit (HOSPITAL_COMMUNITY): Payer: Self-pay

## 2022-12-01 ENCOUNTER — Other Ambulatory Visit: Payer: Self-pay

## 2022-12-01 NOTE — Telephone Encounter (Signed)
Are you able to fill in Dr. Dahlia Client absence.

## 2022-12-02 ENCOUNTER — Other Ambulatory Visit: Payer: Self-pay | Admitting: *Deleted

## 2022-12-02 ENCOUNTER — Other Ambulatory Visit (HOSPITAL_COMMUNITY): Payer: Self-pay

## 2022-12-02 NOTE — Telephone Encounter (Signed)
Dr. Berline Chough on vacation: Patient requested fludrocortisone and midodrine and citalopram refills. Per last ov note patient was instructed to continue citalopram as prescribed. Citalopram renewed #30 with no refills.

## 2022-12-04 ENCOUNTER — Other Ambulatory Visit (HOSPITAL_COMMUNITY): Payer: Self-pay

## 2022-12-04 ENCOUNTER — Other Ambulatory Visit: Payer: Self-pay

## 2022-12-05 ENCOUNTER — Other Ambulatory Visit: Payer: Self-pay | Admitting: Physical Medicine and Rehabilitation

## 2022-12-06 MED ORDER — HYDROCODONE-ACETAMINOPHEN 5-325 MG PO TABS
1.0000 | ORAL_TABLET | ORAL | 0 refills | Status: DC | PRN
  Filled 2022-12-06: qty 150, 19d supply, fill #0

## 2022-12-07 ENCOUNTER — Other Ambulatory Visit: Payer: Self-pay

## 2022-12-07 ENCOUNTER — Other Ambulatory Visit (HOSPITAL_COMMUNITY): Payer: Self-pay

## 2022-12-07 ENCOUNTER — Encounter: Payer: Self-pay | Admitting: Pharmacist

## 2022-12-07 MED ORDER — MIDODRINE HCL 2.5 MG PO TABS
2.5000 mg | ORAL_TABLET | Freq: Three times a day (TID) | ORAL | 5 refills | Status: DC
  Filled 2022-12-07: qty 90, 30d supply, fill #0
  Filled 2023-01-12 – 2023-02-24 (×5): qty 90, 30d supply, fill #1
  Filled 2023-03-18: qty 90, 30d supply, fill #2
  Filled 2023-03-30 – 2023-05-26 (×3): qty 90, 30d supply, fill #3
  Filled 2023-06-18: qty 90, 30d supply, fill #4
  Filled 2023-07-02 – 2023-07-16 (×3): qty 90, 30d supply, fill #5

## 2022-12-07 MED ORDER — FLUDROCORTISONE ACETATE 0.1 MG PO TABS
0.1000 mg | ORAL_TABLET | Freq: Every day | ORAL | 5 refills | Status: DC
  Filled 2022-12-07 – 2023-02-17 (×7): qty 30, 30d supply, fill #0
  Filled 2023-02-24 – 2023-03-14 (×2): qty 30, 30d supply, fill #1
  Filled 2023-04-16: qty 30, 30d supply, fill #2
  Filled 2023-04-20 – 2023-05-26 (×2): qty 30, 30d supply, fill #3
  Filled 2023-06-18 – 2023-10-21 (×13): qty 30, 30d supply, fill #4

## 2022-12-08 ENCOUNTER — Other Ambulatory Visit: Payer: Self-pay

## 2022-12-09 ENCOUNTER — Other Ambulatory Visit (HOSPITAL_COMMUNITY): Payer: Self-pay

## 2022-12-09 ENCOUNTER — Telehealth: Payer: Self-pay | Admitting: Student

## 2022-12-09 NOTE — Telephone Encounter (Signed)
**  After Hours/ Emergency Line Call**  Received a page to call [phone]) - [phone].  Patient: Tracey Hall  Caller: Self  Confirmed name & DOB of patient with caller  Subjective:  Week 3 of diarrhea, is taking peptobismal w/o relief. She's had at least 4 episodes a day. Notes that 3 days ago she started seeing black spots in vision. Also notes that one day she was trying to nap, and she heard a sound "explosion" in her head, was also noticing a head ache. She also note's she's been having a hard time sleeping and feels like she keeps getting shocked awake.   She thought the diarrhea was coming from a medicine, and had stopped all meds/then slowly restarted, w/o relief.   Notes that she feels dizzy when she sits up, and sometimes when laying down. Note's she's not drinking a lot and get's nauseous when she puts something in her mouth.   Patient is sobbing tears on the phone and afraid of going to the ED for how she will be treated. She note's she's behaved poorly in the past, and now they see that and treat her differently/poorly.   Objective:  Observations: patient in emotional distress,   Assessment & Plan  Tracey Hall is a 42 y.o. female with PMHx s/f quadriplegia who calls with the following complaints and concerns diarrhea, HA, dizziness, and emotional distress. Patient in significant emotional distress, sobbing on the phone, and appears to be at her wit's end. Patient also sounding like she may be hypotensive. Recommended that patient go to ED to be evaluated, but patient did not want to go, and asked for appointment in clinic. Scheduled patient for an appointment in clinic, with and understanding that she may still be directed towards the ED.  Appointment scheduled for Access to Care on Thursday 12/10/22  Recommendations:  Go to ED to be evaluated Follow up in clinic for appointment  Go to ED immediately if worsening diarrhea or vomiting, or unable to tolerate PO  -- Red flags  discussed.   -- Will forward to PCP.  Bess Kinds, MD [facility], PGY-1

## 2022-12-10 ENCOUNTER — Ambulatory Visit: Payer: Self-pay

## 2022-12-10 DIAGNOSIS — S1095XA Superficial foreign body of unspecified part of neck, initial encounter: Secondary | ICD-10-CM | POA: Diagnosis not present

## 2022-12-11 MED ORDER — CITALOPRAM HYDROBROMIDE 20 MG PO TABS
20.0000 mg | ORAL_TABLET | Freq: Every day | ORAL | 5 refills | Status: DC
  Filled 2023-03-26: qty 30, 30d supply, fill #0
  Filled 2023-03-30 – 2023-04-20 (×2): qty 30, 30d supply, fill #1

## 2022-12-17 ENCOUNTER — Ambulatory Visit: Payer: Medicare Other | Admitting: Neurology

## 2022-12-17 ENCOUNTER — Encounter: Payer: Self-pay | Admitting: Neurology

## 2022-12-17 DIAGNOSIS — G950 Syringomyelia and syringobulbia: Secondary | ICD-10-CM | POA: Diagnosis not present

## 2022-12-17 DIAGNOSIS — H538 Other visual disturbances: Secondary | ICD-10-CM | POA: Diagnosis not present

## 2022-12-18 ENCOUNTER — Encounter: Payer: Medicare Other | Admitting: Physical Medicine and Rehabilitation

## 2023-01-06 ENCOUNTER — Encounter
Payer: Medicare Other | Attending: Physical Medicine and Rehabilitation | Admitting: Physical Medicine and Rehabilitation

## 2023-01-06 DIAGNOSIS — G950 Syringomyelia and syringobulbia: Secondary | ICD-10-CM | POA: Insufficient documentation

## 2023-01-06 DIAGNOSIS — G4452 New daily persistent headache (NDPH): Secondary | ICD-10-CM | POA: Insufficient documentation

## 2023-01-06 DIAGNOSIS — G8250 Quadriplegia, unspecified: Secondary | ICD-10-CM | POA: Insufficient documentation

## 2023-01-06 DIAGNOSIS — R252 Cramp and spasm: Secondary | ICD-10-CM | POA: Insufficient documentation

## 2023-01-06 DIAGNOSIS — M792 Neuralgia and neuritis, unspecified: Secondary | ICD-10-CM | POA: Insufficient documentation

## 2023-01-06 DIAGNOSIS — Z993 Dependence on wheelchair: Secondary | ICD-10-CM | POA: Insufficient documentation

## 2023-01-11 ENCOUNTER — Telehealth: Payer: Self-pay | Admitting: *Deleted

## 2023-01-11 ENCOUNTER — Encounter: Payer: Medicare Other | Admitting: Physical Medicine and Rehabilitation

## 2023-01-11 NOTE — Telephone Encounter (Signed)
Multiple calls made, phone was picked up but not answer and disconnected x 3.

## 2023-01-12 ENCOUNTER — Encounter: Payer: Self-pay | Admitting: Physical Medicine and Rehabilitation

## 2023-01-12 ENCOUNTER — Other Ambulatory Visit: Payer: Self-pay | Admitting: Physical Medicine and Rehabilitation

## 2023-01-12 ENCOUNTER — Encounter (HOSPITAL_COMMUNITY): Payer: Self-pay

## 2023-01-12 ENCOUNTER — Other Ambulatory Visit (HOSPITAL_COMMUNITY): Payer: Self-pay

## 2023-01-12 ENCOUNTER — Other Ambulatory Visit: Payer: Self-pay

## 2023-01-13 ENCOUNTER — Other Ambulatory Visit: Payer: Self-pay

## 2023-01-13 ENCOUNTER — Encounter: Payer: Self-pay | Admitting: Physical Medicine and Rehabilitation

## 2023-01-13 ENCOUNTER — Encounter: Payer: Medicare Other | Admitting: Physical Medicine and Rehabilitation

## 2023-01-13 ENCOUNTER — Telehealth: Payer: Self-pay | Admitting: *Deleted

## 2023-01-13 VITALS — BP 68/52 | HR 55 | Ht 65.0 in | Wt 210.0 lb

## 2023-01-13 DIAGNOSIS — N319 Neuromuscular dysfunction of bladder, unspecified: Secondary | ICD-10-CM | POA: Diagnosis not present

## 2023-01-13 DIAGNOSIS — K592 Neurogenic bowel, not elsewhere classified: Secondary | ICD-10-CM

## 2023-01-13 DIAGNOSIS — G904 Autonomic dysreflexia: Secondary | ICD-10-CM

## 2023-01-13 DIAGNOSIS — M792 Neuralgia and neuritis, unspecified: Secondary | ICD-10-CM | POA: Diagnosis not present

## 2023-01-13 DIAGNOSIS — W3400XS Accidental discharge from unspecified firearms or gun, sequela: Secondary | ICD-10-CM | POA: Diagnosis not present

## 2023-01-13 DIAGNOSIS — Z993 Dependence on wheelchair: Secondary | ICD-10-CM | POA: Diagnosis not present

## 2023-01-13 DIAGNOSIS — R252 Cramp and spasm: Secondary | ICD-10-CM

## 2023-01-13 DIAGNOSIS — G950 Syringomyelia and syringobulbia: Secondary | ICD-10-CM | POA: Diagnosis not present

## 2023-01-13 DIAGNOSIS — Z87440 Personal history of urinary (tract) infections: Secondary | ICD-10-CM | POA: Diagnosis not present

## 2023-01-13 DIAGNOSIS — G8250 Quadriplegia, unspecified: Secondary | ICD-10-CM | POA: Diagnosis not present

## 2023-01-13 DIAGNOSIS — G4452 New daily persistent headache (NDPH): Secondary | ICD-10-CM | POA: Diagnosis not present

## 2023-01-13 MED ORDER — METHADONE HCL 5 MG PO TABS: 5.0000 mg | ORAL_TABLET | Freq: Two times a day (BID) | ORAL | 0 refills | Status: DC

## 2023-01-13 MED ORDER — TOPIRAMATE 25 MG PO TABS: 25.0000 mg | ORAL_TABLET | Freq: Every day | ORAL | 5 refills | Status: DC

## 2023-01-13 NOTE — Telephone Encounter (Signed)
Luella Cook (KeyCristy Folks) - [phone] Methadone HCl 5MG  tablets Status: PA Response - Approved

## 2023-01-13 NOTE — Telephone Encounter (Signed)
Tracey Hall (KeyCristy Folks) Rx #: [id number] PA Methadone submitted
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
## 2023-01-20 ENCOUNTER — Other Ambulatory Visit (HOSPITAL_COMMUNITY): Payer: Self-pay

## 2023-01-20 ENCOUNTER — Other Ambulatory Visit: Payer: Self-pay | Admitting: Physical Medicine and Rehabilitation

## 2023-01-23 ENCOUNTER — Other Ambulatory Visit (HOSPITAL_COMMUNITY): Payer: Self-pay

## 2023-01-25 ENCOUNTER — Emergency Department (HOSPITAL_COMMUNITY): Payer: Medicare Other

## 2023-01-25 ENCOUNTER — Inpatient Hospital Stay (HOSPITAL_COMMUNITY)
Admission: EM | Admit: 2023-01-25 | Discharge: 2023-01-29 | DRG: 698 | Disposition: A | Discharge status: Home / Self Care | Payer: Medicare Other | Attending: Family Medicine | Admitting: Family Medicine

## 2023-01-25 ENCOUNTER — Other Ambulatory Visit: Payer: Self-pay

## 2023-01-25 DIAGNOSIS — K592 Neurogenic bowel, not elsewhere classified: Secondary | ICD-10-CM | POA: Diagnosis present

## 2023-01-25 DIAGNOSIS — Z1152 Encounter for screening for COVID-19: Secondary | ICD-10-CM

## 2023-01-25 DIAGNOSIS — Y846 Urinary catheterization as the cause of abnormal reaction of the patient, or of later complication, without mention of misadventure at the time of the procedure: Secondary | ICD-10-CM | POA: Diagnosis present

## 2023-01-25 DIAGNOSIS — R0600 Dyspnea, unspecified: Secondary | ICD-10-CM | POA: Diagnosis not present

## 2023-01-25 DIAGNOSIS — Z87440 Personal history of urinary (tract) infections: Secondary | ICD-10-CM

## 2023-01-25 DIAGNOSIS — F05 Delirium due to known physiological condition: Secondary | ICD-10-CM | POA: Diagnosis not present

## 2023-01-25 DIAGNOSIS — R918 Other nonspecific abnormal finding of lung field: Secondary | ICD-10-CM | POA: Diagnosis not present

## 2023-01-25 DIAGNOSIS — G901 Familial dysautonomia [Riley-Day]: Secondary | ICD-10-CM | POA: Diagnosis present

## 2023-01-25 DIAGNOSIS — K6389 Other specified diseases of intestine: Secondary | ICD-10-CM | POA: Diagnosis not present

## 2023-01-25 DIAGNOSIS — Z7401 Bed confinement status: Secondary | ICD-10-CM | POA: Diagnosis not present

## 2023-01-25 DIAGNOSIS — N179 Acute kidney failure, unspecified: Secondary | ICD-10-CM | POA: Diagnosis not present

## 2023-01-25 DIAGNOSIS — N390 Urinary tract infection, site not specified: Secondary | ICD-10-CM | POA: Diagnosis not present

## 2023-01-25 DIAGNOSIS — R4182 Altered mental status, unspecified: Secondary | ICD-10-CM | POA: Insufficient documentation

## 2023-01-25 DIAGNOSIS — I959 Hypotension, unspecified: Secondary | ICD-10-CM | POA: Diagnosis present

## 2023-01-25 DIAGNOSIS — M62562 Muscle wasting and atrophy, not elsewhere classified, left lower leg: Secondary | ICD-10-CM | POA: Diagnosis present

## 2023-01-25 DIAGNOSIS — R404 Transient alteration of awareness: Secondary | ICD-10-CM | POA: Diagnosis not present

## 2023-01-25 DIAGNOSIS — D649 Anemia, unspecified: Secondary | ICD-10-CM | POA: Diagnosis present

## 2023-01-25 DIAGNOSIS — R197 Diarrhea, unspecified: Secondary | ICD-10-CM | POA: Diagnosis not present

## 2023-01-25 DIAGNOSIS — J984 Other disorders of lung: Secondary | ICD-10-CM | POA: Diagnosis not present

## 2023-01-25 DIAGNOSIS — G904 Autonomic dysreflexia: Secondary | ICD-10-CM | POA: Diagnosis present

## 2023-01-25 DIAGNOSIS — R55 Syncope and collapse: Secondary | ICD-10-CM | POA: Diagnosis not present

## 2023-01-25 DIAGNOSIS — I517 Cardiomegaly: Secondary | ICD-10-CM | POA: Diagnosis not present

## 2023-01-25 DIAGNOSIS — B9620 Unspecified Escherichia coli [E. coli] as the cause of diseases classified elsewhere: Secondary | ICD-10-CM | POA: Diagnosis present

## 2023-01-25 DIAGNOSIS — J45909 Unspecified asthma, uncomplicated: Secondary | ICD-10-CM | POA: Diagnosis present

## 2023-01-25 DIAGNOSIS — D509 Iron deficiency anemia, unspecified: Secondary | ICD-10-CM | POA: Diagnosis present

## 2023-01-25 DIAGNOSIS — M542 Cervicalgia: Secondary | ICD-10-CM | POA: Diagnosis present

## 2023-01-25 DIAGNOSIS — H538 Other visual disturbances: Secondary | ICD-10-CM | POA: Diagnosis present

## 2023-01-25 DIAGNOSIS — T83511A Infection and inflammatory reaction due to indwelling urethral catheter, initial encounter: Principal | ICD-10-CM | POA: Diagnosis present

## 2023-01-25 DIAGNOSIS — Z8711 Personal history of peptic ulcer disease: Secondary | ICD-10-CM

## 2023-01-25 DIAGNOSIS — R6520 Severe sepsis without septic shock: Secondary | ICD-10-CM | POA: Diagnosis not present

## 2023-01-25 DIAGNOSIS — R9431 Abnormal electrocardiogram [ECG] [EKG]: Secondary | ICD-10-CM | POA: Diagnosis present

## 2023-01-25 DIAGNOSIS — G932 Benign intracranial hypertension: Secondary | ICD-10-CM | POA: Diagnosis present

## 2023-01-25 DIAGNOSIS — R519 Headache, unspecified: Secondary | ICD-10-CM | POA: Insufficient documentation

## 2023-01-25 DIAGNOSIS — Z79899 Other long term (current) drug therapy: Secondary | ICD-10-CM | POA: Diagnosis not present

## 2023-01-25 DIAGNOSIS — D638 Anemia in other chronic diseases classified elsewhere: Secondary | ICD-10-CM | POA: Diagnosis present

## 2023-01-25 DIAGNOSIS — Z880 Allergy status to penicillin: Secondary | ICD-10-CM | POA: Diagnosis not present

## 2023-01-25 DIAGNOSIS — E871 Hypo-osmolality and hyponatremia: Secondary | ICD-10-CM | POA: Diagnosis present

## 2023-01-25 DIAGNOSIS — G8929 Other chronic pain: Secondary | ICD-10-CM | POA: Diagnosis present

## 2023-01-25 DIAGNOSIS — A419 Sepsis, unspecified organism: Secondary | ICD-10-CM | POA: Diagnosis present

## 2023-01-25 DIAGNOSIS — R159 Full incontinence of feces: Secondary | ICD-10-CM | POA: Diagnosis not present

## 2023-01-25 DIAGNOSIS — R531 Weakness: Secondary | ICD-10-CM | POA: Diagnosis not present

## 2023-01-25 DIAGNOSIS — M62561 Muscle wasting and atrophy, not elsewhere classified, right lower leg: Secondary | ICD-10-CM | POA: Diagnosis present

## 2023-01-25 DIAGNOSIS — N319 Neuromuscular dysfunction of bladder, unspecified: Secondary | ICD-10-CM | POA: Diagnosis present

## 2023-01-25 DIAGNOSIS — E876 Hypokalemia: Secondary | ICD-10-CM | POA: Diagnosis not present

## 2023-01-25 DIAGNOSIS — E872 Acidosis, unspecified: Secondary | ICD-10-CM | POA: Diagnosis present

## 2023-01-25 DIAGNOSIS — G8250 Quadriplegia, unspecified: Secondary | ICD-10-CM | POA: Diagnosis not present

## 2023-01-25 DIAGNOSIS — R0989 Other specified symptoms and signs involving the circulatory and respiratory systems: Secondary | ICD-10-CM | POA: Diagnosis not present

## 2023-01-25 DIAGNOSIS — Z6837 Body mass index (BMI) 37.0-37.9, adult: Secondary | ICD-10-CM

## 2023-01-25 DIAGNOSIS — R0689 Other abnormalities of breathing: Secondary | ICD-10-CM | POA: Diagnosis not present

## 2023-01-25 DIAGNOSIS — I10 Essential (primary) hypertension: Secondary | ICD-10-CM | POA: Diagnosis not present

## 2023-01-25 DIAGNOSIS — N920 Excessive and frequent menstruation with regular cycle: Secondary | ICD-10-CM | POA: Diagnosis not present

## 2023-01-25 DIAGNOSIS — M549 Dorsalgia, unspecified: Secondary | ICD-10-CM | POA: Diagnosis present

## 2023-01-25 DIAGNOSIS — R638 Other symptoms and signs concerning food and fluid intake: Secondary | ICD-10-CM | POA: Insufficient documentation

## 2023-01-25 DIAGNOSIS — R5381 Other malaise: Secondary | ICD-10-CM | POA: Diagnosis present

## 2023-01-25 DIAGNOSIS — L8990 Pressure ulcer of unspecified site, unspecified stage: Secondary | ICD-10-CM | POA: Diagnosis present

## 2023-01-25 DIAGNOSIS — F419 Anxiety disorder, unspecified: Secondary | ICD-10-CM | POA: Diagnosis present

## 2023-01-25 DIAGNOSIS — D259 Leiomyoma of uterus, unspecified: Secondary | ICD-10-CM | POA: Diagnosis present

## 2023-01-25 DIAGNOSIS — H5704 Mydriasis: Secondary | ICD-10-CM | POA: Diagnosis present

## 2023-01-25 LAB — URINALYSIS, W/ REFLEX TO CULTURE (INFECTION SUSPECTED)
Bilirubin Urine: NEGATIVE
Glucose, UA: NEGATIVE mg/dL
Hgb urine dipstick: NEGATIVE
Ketones, ur: NEGATIVE mg/dL
Nitrite: NEGATIVE
Protein, ur: 100 mg/dL — AB
Specific Gravity, Urine: 1.021 (ref 1.005–1.030)
WBC, UA: >50 WBC/hpf (ref 0–5)
pH: 5 (ref 5.0–8.0)

## 2023-01-25 LAB — CULTURE, BLOOD (ROUTINE X 2)
Culture: NO GROWTH
Culture: NO GROWTH

## 2023-01-25 LAB — BPAM RBC
Blood Product Expiration Date: 202408092359
Blood Product Expiration Date: 202409022359
Blood Product Expiration Date: 202409052359
ISSUE DATE / TIME: 202408050655
ISSUE DATE / TIME: 202408051013
Unit Type and Rh: 5100
Unit Type and Rh: 5100
Unit Type and Rh: 9500

## 2023-01-25 LAB — TYPE AND SCREEN
ABO/RH(D): O POS
Antibody Screen: NEGATIVE
Unit division: 0
Unit division: 0
Unit division: 0

## 2023-01-25 LAB — POC OCCULT BLOOD, ED: Fecal Occult Bld: NEGATIVE

## 2023-01-25 LAB — I-STAT CHEM 8, ED
BUN: 23 mg/dL — ABNORMAL HIGH (ref 6–20)
Calcium, Ion: 0.99 mmol/L — ABNORMAL LOW (ref 1.15–1.40)
Chloride: 98 mmol/L (ref 98–111)
Creatinine, Ser: 2.4 mg/dL — ABNORMAL HIGH (ref 0.44–1.00)
Glucose, Bld: 146 mg/dL — ABNORMAL HIGH (ref 70–99)
HCT: 17 % — ABNORMAL LOW (ref 36.0–46.0)
Hemoglobin: 5.8 g/dL — CL (ref 12.0–15.0)
Potassium: 3.2 mmol/L — ABNORMAL LOW (ref 3.5–5.1)
Sodium: 131 mmol/L — ABNORMAL LOW (ref 135–145)
TCO2: 17 mmol/L — ABNORMAL LOW (ref 22–32)

## 2023-01-25 LAB — RESP PANEL BY RT-PCR (RSV, FLU A&B, COVID)  RVPGX2
Influenza A by PCR: NEGATIVE
Influenza B by PCR: NEGATIVE
Resp Syncytial Virus by PCR: NEGATIVE
SARS Coronavirus 2 by RT PCR: NEGATIVE

## 2023-01-25 LAB — COMPREHENSIVE METABOLIC PANEL
ALT: 24 U/L (ref 0–44)
ALT: 48 U/L — ABNORMAL HIGH (ref 0–44)
AST: 52 U/L — ABNORMAL HIGH (ref 15–41)
AST: 119 U/L — ABNORMAL HIGH (ref 15–41)
Albumin: 2.9 g/dL — ABNORMAL LOW (ref 3.5–5.0)
Albumin: 2.7 g/dL — ABNORMAL LOW (ref 3.5–5.0)
Alkaline Phosphatase: 71 U/L (ref 38–126)
Alkaline Phosphatase: 72 U/L (ref 38–126)
Anion gap: 14 (ref 5–15)
Anion gap: 11 (ref 5–15)
BUN: 24 mg/dL — ABNORMAL HIGH (ref 6–20)
BUN: 14 mg/dL (ref 6–20)
CO2: 17 mmol/L — ABNORMAL LOW (ref 22–32)
CO2: 18 mmol/L — ABNORMAL LOW (ref 22–32)
Calcium: 7.6 mg/dL — ABNORMAL LOW (ref 8.9–10.3)
Calcium: 7.5 mg/dL — ABNORMAL LOW (ref 8.9–10.3)
Chloride: 96 mmol/L — ABNORMAL LOW (ref 98–111)
Chloride: 104 mmol/L (ref 98–111)
Creatinine, Ser: 2.2 mg/dL — ABNORMAL HIGH (ref 0.44–1.00)
Creatinine, Ser: 0.89 mg/dL (ref 0.44–1.00)
GFR, Estimated: 28 mL/min — ABNORMAL LOW (ref >60)
GFR, Estimated: >60 mL/min (ref >60)
Glucose, Bld: 143 mg/dL — ABNORMAL HIGH (ref 70–99)
Glucose, Bld: 107 mg/dL — ABNORMAL HIGH (ref 70–99)
Potassium: 3 mmol/L — ABNORMAL LOW (ref 3.5–5.1)
Potassium: 3.1 mmol/L — ABNORMAL LOW (ref 3.5–5.1)
Sodium: 127 mmol/L — ABNORMAL LOW (ref 135–145)
Sodium: 133 mmol/L — ABNORMAL LOW (ref 135–145)
Total Bilirubin: 0.6 mg/dL (ref 0.3–1.2)
Total Bilirubin: 0.9 mg/dL (ref 0.3–1.2)
Total Protein: 6.9 g/dL (ref 6.5–8.1)
Total Protein: 6.8 g/dL (ref 6.5–8.1)

## 2023-01-25 LAB — CBC WITH DIFFERENTIAL/PLATELET
Abs Immature Granulocytes: 0 10*3/uL (ref 0.00–0.07)
Basophils Absolute: 0 10*3/uL (ref 0.0–0.1)
Basophils Relative: 0 %
Eosinophils Absolute: 0 10*3/uL (ref 0.0–0.5)
Eosinophils Relative: 0 %
HCT: 15.3 % — ABNORMAL LOW (ref 36.0–46.0)
Hemoglobin: 4.2 g/dL — CL (ref 12.0–15.0)
Lymphocytes Relative: 5 %
Lymphs Abs: 1.4 10*3/uL (ref 0.7–4.0)
MCH: 18.5 pg — ABNORMAL LOW (ref 26.0–34.0)
MCHC: 27.5 g/dL — ABNORMAL LOW (ref 30.0–36.0)
MCV: 67.4 fL — ABNORMAL LOW (ref 80.0–100.0)
Monocytes Absolute: 0 10*3/uL — ABNORMAL LOW (ref 0.1–1.0)
Monocytes Relative: 0 %
Neutro Abs: 25.7 10*3/uL — ABNORMAL HIGH (ref 1.7–7.7)
Neutrophils Relative %: 95 %
Platelets: 300 10*3/uL (ref 150–400)
RBC: 2.27 MIL/uL — ABNORMAL LOW (ref 3.87–5.11)
RDW: 21.4 % — ABNORMAL HIGH (ref 11.5–15.5)
WBC: 27 10*3/uL — ABNORMAL HIGH (ref 4.0–10.5)
nRBC: 0 /100 WBC
nRBC: 0.1 % (ref 0.0–0.2)

## 2023-01-25 LAB — PREPARE RBC (CROSSMATCH)

## 2023-01-25 LAB — PROTIME-INR
INR: 1.3 — ABNORMAL HIGH (ref 0.8–1.2)
Prothrombin Time: 16.2 seconds — ABNORMAL HIGH (ref 11.4–15.2)

## 2023-01-25 LAB — CBC
HCT: 22.2 % — ABNORMAL LOW (ref 36.0–46.0)
Hemoglobin: 6.5 g/dL — CL (ref 12.0–15.0)
MCH: 21.1 pg — ABNORMAL LOW (ref 26.0–34.0)
MCHC: 29.3 g/dL — ABNORMAL LOW (ref 30.0–36.0)
MCV: 72.1 fL — ABNORMAL LOW (ref 80.0–100.0)
Platelets: 225 10*3/uL (ref 150–400)
RBC: 3.08 MIL/uL — ABNORMAL LOW (ref 3.87–5.11)
RDW: 23.2 % — ABNORMAL HIGH (ref 11.5–15.5)
WBC: 22.9 10*3/uL — ABNORMAL HIGH (ref 4.0–10.5)
nRBC: 0.1 % (ref 0.0–0.2)

## 2023-01-25 LAB — I-STAT CG4 LACTIC ACID, ED
Lactic Acid, Venous: 2.2 mmol/L (ref 0.5–1.9)
Lactic Acid, Venous: 2.6 mmol/L (ref 0.5–1.9)
Lactic Acid, Venous: 0.8 mmol/L (ref 0.5–1.9)

## 2023-01-25 LAB — IRON AND TIBC
Iron: 12 ug/dL — ABNORMAL LOW (ref 28–170)
Saturation Ratios: 3 % — ABNORMAL LOW (ref 10.4–31.8)
TIBC: 356 ug/dL (ref 250–450)
UIBC: 344 ug/dL

## 2023-01-25 LAB — MAGNESIUM: Magnesium: 1.9 mg/dL (ref 1.7–2.4)

## 2023-01-25 LAB — LACTIC ACID, PLASMA: Lactic Acid, Venous: 2.4 mmol/L (ref 0.5–1.9)

## 2023-01-25 LAB — HCG, SERUM, QUALITATIVE: Preg, Serum: NEGATIVE

## 2023-01-25 LAB — HIV ANTIBODY (ROUTINE TESTING W REFLEX): HIV Screen 4th Generation wRfx: NONREACTIVE

## 2023-01-25 LAB — CBG MONITORING, ED: Glucose-Capillary: 138 mg/dL — ABNORMAL HIGH (ref 70–99)

## 2023-01-25 LAB — FERRITIN: Ferritin: 218 ng/mL (ref 11–307)

## 2023-01-25 MED ORDER — PANTOPRAZOLE SODIUM 40 MG IV SOLR
40.0000 mg | Freq: Two times a day (BID) | INTRAVENOUS | Status: DC
  Administered 2023-01-25 – 2023-01-28 (×6): 40 mg via INTRAVENOUS
  Filled 2023-01-25 (×7): qty 10

## 2023-01-25 MED ORDER — SODIUM CHLORIDE 0.9% IV SOLUTION: Freq: Once | INTRAVENOUS | Status: DC

## 2023-01-25 MED ORDER — METRONIDAZOLE 500 MG/100ML IV SOLN
500.0000 mg | Freq: Once | INTRAVENOUS | Status: AC
  Administered 2023-01-25: 500 mg via INTRAVENOUS
  Filled 2023-01-25: qty 100

## 2023-01-25 MED ORDER — SODIUM CHLORIDE 0.9% IV SOLUTION: Freq: Once | INTRAVENOUS | Status: AC

## 2023-01-25 MED ORDER — ALBUTEROL SULFATE (2.5 MG/3ML) 0.083% IN NEBU: 2.5000 mg | INHALATION_SOLUTION | RESPIRATORY_TRACT | Status: DC | PRN

## 2023-01-25 MED ORDER — QUETIAPINE FUMARATE 25 MG PO TABS: 25.0000 mg | ORAL_TABLET | Freq: Two times a day (BID) | ORAL | Status: DC | PRN

## 2023-01-25 MED ORDER — VANCOMYCIN HCL 10 G IV SOLR
2000.0000 mg | Freq: Once | INTRAVENOUS | Status: AC
  Administered 2023-01-25: 2000 mg via INTRAVENOUS
  Filled 2023-01-25: qty 2000

## 2023-01-25 MED ORDER — OXYCODONE HCL 5 MG PO TABS
5.0000 mg | ORAL_TABLET | ORAL | Status: DC | PRN
  Administered 2023-01-25 – 2023-01-29 (×10): 5 mg via ORAL
  Filled 2023-01-25 (×10): qty 1

## 2023-01-25 MED ORDER — SILVER SULFADIAZINE 1 % EX CREA
TOPICAL_CREAM | Freq: Every day | CUTANEOUS | Status: DC
  Filled 2023-01-25: qty 85

## 2023-01-25 MED ORDER — CITALOPRAM HYDROBROMIDE 10 MG PO TABS
20.0000 mg | ORAL_TABLET | Freq: Every day | ORAL | Status: DC
  Administered 2023-01-25: 20 mg via ORAL
  Filled 2023-01-25: qty 2

## 2023-01-25 MED ORDER — ACETAMINOPHEN 500 MG PO TABS
1000.0000 mg | ORAL_TABLET | Freq: Once | ORAL | Status: DC
  Filled 2023-01-25: qty 2

## 2023-01-25 MED ORDER — SODIUM CHLORIDE 0.9 % IV BOLUS
1000.0000 mL | Freq: Once | INTRAVENOUS | Status: AC
  Administered 2023-01-25: 1000 mL via INTRAVENOUS

## 2023-01-25 MED ORDER — MIDODRINE HCL 5 MG PO TABS
2.5000 mg | ORAL_TABLET | Freq: Three times a day (TID) | ORAL | Status: DC
  Administered 2023-01-25 – 2023-01-29 (×13): 2.5 mg via ORAL
  Filled 2023-01-25 (×13): qty 1

## 2023-01-25 MED ORDER — ACETAMINOPHEN 10 MG/ML IV SOLN
500.0000 mg | Freq: Once | INTRAVENOUS | Status: AC
  Administered 2023-01-25: 1000 mg via INTRAVENOUS
  Filled 2023-01-25: qty 50

## 2023-01-25 MED ORDER — TIZANIDINE HCL 4 MG PO TABS: 4.0000 mg | ORAL_TABLET | Freq: Three times a day (TID) | ORAL | Status: DC

## 2023-01-25 MED ORDER — LIDOCAINE 5 % EX PTCH
3.0000 | MEDICATED_PATCH | CUTANEOUS | Status: DC
  Filled 2023-01-25 (×5): qty 3

## 2023-01-25 MED ORDER — METHADONE HCL 10 MG PO TABS
5.0000 mg | ORAL_TABLET | Freq: Two times a day (BID) | ORAL | Status: DC
  Administered 2023-01-25 – 2023-01-29 (×7): 5 mg via ORAL
  Filled 2023-01-25 (×7): qty 1

## 2023-01-25 MED ORDER — VANCOMYCIN HCL IN DEXTROSE 1-5 GM/200ML-% IV SOLN: 1000.0000 mg | Freq: Once | INTRAVENOUS | Status: DC

## 2023-01-25 MED ORDER — DIAZEPAM 2 MG PO TABS
5.0000 mg | ORAL_TABLET | Freq: Two times a day (BID) | ORAL | Status: DC | PRN
  Administered 2023-01-26 – 2023-01-29 (×4): 5 mg via ORAL
  Filled 2023-01-25 (×5): qty 3

## 2023-01-25 MED ORDER — TOPIRAMATE 100 MG PO TABS
100.0000 mg | ORAL_TABLET | Freq: Every day | ORAL | Status: DC
  Administered 2023-01-25 – 2023-01-28 (×3): 100 mg via ORAL
  Filled 2023-01-25 (×3): qty 1
  Filled 2023-01-25: qty 4
  Filled 2023-01-25: qty 1

## 2023-01-25 MED ORDER — GABAPENTIN 300 MG PO CAPS
600.0000 mg | ORAL_CAPSULE | Freq: Three times a day (TID) | ORAL | Status: DC
  Administered 2023-01-25 – 2023-01-29 (×12): 600 mg via ORAL
  Filled 2023-01-25 (×13): qty 2

## 2023-01-25 MED ORDER — DULOXETINE HCL 60 MG PO CPEP
60.0000 mg | ORAL_CAPSULE | Freq: Every day | ORAL | Status: DC
  Administered 2023-01-25 – 2023-01-29 (×5): 60 mg via ORAL
  Filled 2023-01-25 (×4): qty 1
  Filled 2023-01-25: qty 2

## 2023-01-25 MED ORDER — FLUDROCORTISONE ACETATE 0.1 MG PO TABS
0.1000 mg | ORAL_TABLET | Freq: Every day | ORAL | Status: DC
  Administered 2023-01-26 – 2023-01-29 (×4): 0.1 mg via ORAL
  Filled 2023-01-25 (×4): qty 1

## 2023-01-25 MED ORDER — SODIUM CHLORIDE 0.9 % IV SOLN
2.0000 g | Freq: Two times a day (BID) | INTRAVENOUS | Status: DC
  Administered 2023-01-25: 2 g via INTRAVENOUS
  Filled 2023-01-25: qty 12.5

## 2023-01-25 MED ORDER — SODIUM CHLORIDE 0.9 % IV SOLN
2.0000 g | Freq: Once | INTRAVENOUS | Status: AC
  Administered 2023-01-25: 2 g via INTRAVENOUS
  Filled 2023-01-25: qty 12.5

## 2023-01-25 MED ORDER — POTASSIUM CHLORIDE CRYS ER 20 MEQ PO TBCR: 40.0000 meq | EXTENDED_RELEASE_TABLET | Freq: Once | ORAL | Status: DC

## 2023-01-25 NOTE — Assessment & Plan Note (Addendum)
Likely urinary source given foul odor reported by family at bedside consistent with prior UTIs. Presentation is consistent with past admissions for urinary source sepsis, most recently in 09/2021 and 12/2020. Has a history of growing E coli, Klebsiella (amp resistant), pan-sensitive citrobacter, and Providencia stuartii (Amp, Unasyn, Cefazolin, and Gentamicin resistant).  Her sacral decubitus ulcer is also a potential source of infection but does not appear infected on visual inspection.  Considered CNS infection as well given her report of neck pain, but this neck pain is chronic and she is able to move her neck when prompted. - Progressive care, inpatient status - Follow urine and blood cultures - Continue Cefepime, okay to discontinue vancomycin and Flagyl  -Status post 1 L NS in the ED, need to reassess fluid status after she has received blood transfusions

## 2023-01-25 NOTE — Progress Notes (Signed)
ED Pharmacy Antibiotic Sign Off An antibiotic consult was received from an ED provider for vancomycin and cefepime per pharmacy dosing for sepsis. A chart review was completed to assess appropriateness.   The following one time order(s) were placed:  Vancomycin 2g IV x1 Cefepime 2g IV x1  Further antibiotic and/or antibiotic pharmacy consults should be ordered by the admitting provider if indicated.   Thank you for allowing pharmacy to be a part of this patient's care.   Vernard Gambles, PharmD, BCPS   Clinical Pharmacist 01/25/23 7:08 AM
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
## 2023-01-25 NOTE — ED Triage Notes (Addendum)
Patient arrived with EMS from home reports fever this morning with concentrated/purulent urinary drainage at foley catheter , hypotension and lethargy BP=70/32 . CBG= 206. Unable to establish peripheral IV despite multiple venipuncture attempts at arrival .

## 2023-01-25 NOTE — Assessment & Plan Note (Addendum)
Initial Hgb 4.2 on arrival to the ED. Has a history of mixed IDA and anemia of chronic disease.  History of heavy menses.  Also a history of PUD with GI Bleed. Has been on iron in the past but does not appear to be on any at present. Receiving 2 units blood in the ED. No reports of hematochezia/melena or hemoptysis.  FOBT negative. - Follow-up post-transfusion H&H after 2 units - Transfusion threshold <7 - 2 large bore IV - BID PPI for now - Iron studies ordered - Not a good candidate for IV iron now with active infection, but could consider as she nears discharge

## 2023-01-25 NOTE — ED Notes (Signed)
Patient refused RN to swab nose for Covid test .

## 2023-01-25 NOTE — ED Notes (Signed)
Green sheets for blood units (x2) and paper consent placed in Medical Record drawer at [facility].

## 2023-01-25 NOTE — Assessment & Plan Note (Addendum)
History of. Tells me she used to be on Advair but has been off for ~10 years. Is wheezing today. CXR with cardiomegaly but no pulmonary edema to suggest cardiac source of her wheeze at present. She was placed on 4L [place] on arrival, but I was able to turn her to room air without issue. Do not believe she had true hypoxia.  - Will trial some albuterol nebs for her wheezing. If no improvement with albuterol, consider workup for CHF given cardiomegaly.  -COVID testing has been ordered by the ED

## 2023-01-25 NOTE — Assessment & Plan Note (Addendum)
Managed by Dr. Berline Chough from PM&R.  Accompanied by some intermittent autonomic dysreflexia.  Does have significant pain at baseline that it seems it has been difficult to control.  Was recently transitioned from Norco to twice daily methadone.  Also takes Cymbalta for Tracey Hall pain. -Continue home Gabapentin, hold this if Tracey Hall mental status declines  -Continue home Valium 5 mg twice daily as needed for spasms -Continue home Cymbalta 60 mg daily -Methadone 5mg  BID  -Oxycodone 5mg  q4h PRN for breakthrough pain -Home lidocaine patches for neck pain  -Holding home tizanidine given prolonged QTc

## 2023-01-25 NOTE — ED Notes (Signed)
Cleaned pt after she had a large, Azar, liquid bowel movement.

## 2023-01-25 NOTE — ED Notes (Signed)
Called upstairs regarding room cleaning status. Report finishing up cleaning room at this time. Charge is aware.

## 2023-01-25 NOTE — Assessment & Plan Note (Addendum)
Present on admission.  Multiple possible causes including her sepsis, autonomic dysreflexia in the setting of her paraplegia, acute on chronic anemia, and her known underlying chronic hypotension.  She is on both Florinef and midodrine at home.  BP is already improving with transfusion.  Last pressure 112/65. -Monitor BPs with transfusion -Continue home midodrine and fludrocortisone -Need to re assess fluid status after transfusion(s), may need more fluids

## 2023-01-25 NOTE — ED Notes (Signed)
Pt noted to have liquid BM. Changed and cleansed pt at this time.

## 2023-01-25 NOTE — Assessment & Plan Note (Addendum)
Creatinine of 2.2 on arrival, up from baseline of 0.43.  Potentially multifactorial in the setting of prerenal hypovolemia and likely intrinsic renal injury from her UTI.  Also with multiple electrolyte abnormalities including hypokalemia and hyponatremia.  Suspect that hyponatremia is hypovolemic. -Status post 1 L NS -Receiving ongoing volume resuscitation with blood -Anticipate sodium improving with fluid resuscitation -I have ordered repletion of her K -Repeat chemistry this afternoon

## 2023-01-25 NOTE — ED Notes (Signed)
Pt continues to refuse to be swabbed for Resp panel.  Different staff members have attempted.

## 2023-01-25 NOTE — ED Notes (Signed)
EDP IV attempting ultrasoung assisted venipuncture at this time / IV team notified .

## 2023-01-25 NOTE — ED Notes (Signed)
Pt continues to refuse Resp panel

## 2023-01-25 NOTE — Sepsis Progress Note (Signed)
Code Sepsis protocol being monitored by eLink. 

## 2023-01-25 NOTE — Progress Notes (Addendum)
Pharmacy Antibiotic Note  Tracey Hall is a 42 y.o. female admitted on 01/25/2023 presenting with AMS, fever, concern for UTI.  Pharmacy has been consulted for Cefepime dosing.  Plan: Cefepime 2g IV q 12h Monitor renal function, Cx and clinical progression to narrow     Temp (24hrs), Avg:99.3 F (37.4 C), Min:97.5 F (36.4 C), Max:100.7 F (38.2 C)  Recent Labs  Lab 01/25/23 0644 01/25/23 0645 01/25/23 0647 01/25/23 0648 01/25/23 0732 01/25/23 1042  WBC  --  27.0*  --   --   --   --   CREATININE  --  2.20*  --  2.40*  --   --   LATICACIDVEN 2.4*  --  2.2*  --  2.6* 0.8    Estimated Creatinine Clearance: 35.2 mL/min (A) (by C-G formula based on SCr of 2.4 mg/dL (H)).    Allergies  Allergen Reactions   Cortisone Rash    Severe rash    Hydrocortisone    Latex Rash    Daylene Posey, PharmD, [facility] Clinical Pharmacist ED Pharmacist Phone # [phone] 01/25/2023 10:48 AM
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
## 2023-01-25 NOTE — Sepsis Progress Note (Addendum)
Lactic acid rising.  Has had 1L NS and 3 antibiotics. Hgb 5.8. 2u PRBC's ordered. Midodrine ordered. PCCM consult
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
## 2023-01-25 NOTE — Assessment & Plan Note (Addendum)
QTc 539. Is on a number of QT prolonging meds at home, namely methadone, Seroquel, and tizanidine. Risks/benefits need to be weighed here. Her major chronic issue is her neck pain. She's been on the methadone for ~1.5 weeks. Suspect she has reached her steady state plasma concentrations and would not expect further QT prolonging effect from here.  - Avoid QT prolonging meds as able. Hold her seroquel and tizanidine for now.  - Continue methadone 5 mg BID for now given risk/benefits consideration as above. Would not increase dose.  - Repeat EKG tomorrow - Optimize electrolytes
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
## 2023-01-25 NOTE — Assessment & Plan Note (Signed)
Chronic, daily.  - Continue home Topamax 100mg  QHS

## 2023-01-25 NOTE — ED Notes (Signed)
Pt continues to refuse Resp panel swab.
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
## 2023-01-25 NOTE — ED Notes (Signed)
Notified admitting provider regarding pt temp at this time. Report will try to do IV tylenol. Awaiting orders.

## 2023-01-25 NOTE — Assessment & Plan Note (Signed)
Chronic.  Being cared for at home by her significant other Darrell. -WOC consult for wound care in house -RD consult for nutritional status, albumin 2.9

## 2023-01-25 NOTE — Assessment & Plan Note (Addendum)
Likely secondary to sepsis as above.  She is mentating appropriately at present, just has been a bit more tired than usual with generalized malaise. per her and her family's report.  She is alert and oriented.  Not concerned for CNS infection at this time as she is able to move her neck, though with considerable pain given her chronic pain.  There is also some pupillary dilation to approximately 6 mm but they are reactive.  Suspect that this dilation is in the setting of having not had her home opioids for some time, possibly some element of mild withdrawal. Also consider her use of several potentially sedating meds contributing (Tizanidine, Valium, Gabapentin, Trazodone, Seroquel, opioids), but this seems less likely at present given that she has been on relatively stable doses for a while now.  -Treatment of her sepsis as above -If her somnolence worsens or she becomes confused, would obtain CT of the head

## 2023-01-26 ENCOUNTER — Inpatient Hospital Stay (HOSPITAL_COMMUNITY): Payer: Medicare Other

## 2023-01-26 ENCOUNTER — Encounter (HOSPITAL_COMMUNITY): Payer: Self-pay | Admitting: Student

## 2023-01-26 ENCOUNTER — Other Ambulatory Visit: Payer: Self-pay

## 2023-01-26 DIAGNOSIS — N179 Acute kidney failure, unspecified: Secondary | ICD-10-CM

## 2023-01-26 DIAGNOSIS — R197 Diarrhea, unspecified: Secondary | ICD-10-CM | POA: Insufficient documentation

## 2023-01-26 LAB — BASIC METABOLIC PANEL
Anion gap: 13 (ref 5–15)
BUN: 9 mg/dL (ref 6–20)
CO2: 19 mmol/L — ABNORMAL LOW (ref 22–32)
Calcium: 7.8 mg/dL — ABNORMAL LOW (ref 8.9–10.3)
Chloride: 101 mmol/L (ref 98–111)
Creatinine, Ser: 0.74 mg/dL (ref 0.44–1.00)
GFR, Estimated: >60 mL/min (ref >60)
Glucose, Bld: 125 mg/dL — ABNORMAL HIGH (ref 70–99)
Potassium: 3.2 mmol/L — ABNORMAL LOW (ref 3.5–5.1)
Sodium: 133 mmol/L — ABNORMAL LOW (ref 135–145)

## 2023-01-26 LAB — CBC
HCT: 24.9 % — ABNORMAL LOW (ref 36.0–46.0)
Hemoglobin: 7.7 g/dL — ABNORMAL LOW (ref 12.0–15.0)
MCH: 22.3 pg — ABNORMAL LOW (ref 26.0–34.0)
MCHC: 30.9 g/dL (ref 30.0–36.0)
MCV: 72 fL — ABNORMAL LOW (ref 80.0–100.0)
Platelets: 220 10*3/uL (ref 150–400)
RBC: 3.46 MIL/uL — ABNORMAL LOW (ref 3.87–5.11)
RDW: 23.8 % — ABNORMAL HIGH (ref 11.5–15.5)
WBC: 19.9 10*3/uL — ABNORMAL HIGH (ref 4.0–10.5)
nRBC: 0.1 % (ref 0.0–0.2)

## 2023-01-26 LAB — MRSA NEXT GEN BY PCR, NASAL: MRSA by PCR Next Gen: NOT DETECTED

## 2023-01-26 LAB — MAGNESIUM: Magnesium: 2 mg/dL (ref 1.7–2.4)

## 2023-01-26 LAB — APTT: aPTT: 40 seconds — ABNORMAL HIGH (ref 24–36)

## 2023-01-26 LAB — LACTIC ACID, PLASMA: Lactic Acid, Venous: 1.2 mmol/L (ref 0.5–1.9)

## 2023-01-26 MED ORDER — ALBUTEROL SULFATE (2.5 MG/3ML) 0.083% IN NEBU
2.5000 mg | INHALATION_SOLUTION | RESPIRATORY_TRACT | Status: DC
  Administered 2023-01-26: 2.5 mg via RESPIRATORY_TRACT
  Filled 2023-01-26: qty 3

## 2023-01-26 MED ORDER — CITALOPRAM HYDROBROMIDE 20 MG PO TABS
20.0000 mg | ORAL_TABLET | Freq: Every day | ORAL | Status: DC
  Administered 2023-01-26 – 2023-01-29 (×4): 20 mg via ORAL
  Filled 2023-01-26 (×4): qty 1

## 2023-01-26 MED ORDER — ADULT MULTIVITAMIN W/MINERALS CH
1.0000 | ORAL_TABLET | Freq: Every day | ORAL | Status: DC
  Administered 2023-01-27 – 2023-01-29 (×3): 1 via ORAL
  Filled 2023-01-26 (×3): qty 1

## 2023-01-26 MED ORDER — CHLORHEXIDINE GLUCONATE CLOTH 2 % EX PADS
6.0000 | MEDICATED_PAD | Freq: Every day | CUTANEOUS | Status: DC
  Administered 2023-01-27 – 2023-01-29 (×3): 6 via TOPICAL

## 2023-01-26 MED ORDER — POTASSIUM CHLORIDE CRYS ER 20 MEQ PO TBCR
40.0000 meq | EXTENDED_RELEASE_TABLET | Freq: Once | ORAL | Status: AC
  Administered 2023-01-26: 40 meq via ORAL
  Filled 2023-01-26: qty 2

## 2023-01-26 MED ORDER — ALBUTEROL SULFATE (2.5 MG/3ML) 0.083% IN NEBU
2.5000 mg | INHALATION_SOLUTION | Freq: Two times a day (BID) | RESPIRATORY_TRACT | Status: DC
  Administered 2023-01-26 – 2023-01-27 (×2): 2.5 mg via RESPIRATORY_TRACT
  Filled 2023-01-26 (×2): qty 3

## 2023-01-26 MED ORDER — ENOXAPARIN SODIUM 40 MG/0.4ML IJ SOSY
40.0000 mg | PREFILLED_SYRINGE | INTRAMUSCULAR | Status: DC
  Filled 2023-01-26: qty 0.4

## 2023-01-26 MED ORDER — ACETAMINOPHEN 10 MG/ML IV SOLN
500.0000 mg | Freq: Once | INTRAVENOUS | Status: AC
  Administered 2023-01-26: 500 mg via INTRAVENOUS
  Filled 2023-01-26 (×2): qty 50

## 2023-01-26 MED ORDER — TIZANIDINE HCL 4 MG PO TABS
4.0000 mg | ORAL_TABLET | Freq: Three times a day (TID) | ORAL | Status: DC
  Administered 2023-01-26 – 2023-01-29 (×8): 4 mg via ORAL
  Filled 2023-01-26 (×9): qty 1

## 2023-01-26 MED ORDER — QUETIAPINE FUMARATE 50 MG PO TABS: 25.0000 mg | ORAL_TABLET | Freq: Two times a day (BID) | ORAL | Status: DC | PRN

## 2023-01-26 MED ORDER — SODIUM CHLORIDE 0.9 % IV SOLN
2.0000 g | Freq: Three times a day (TID) | INTRAVENOUS | Status: DC
  Administered 2023-01-26 – 2023-01-27 (×4): 2 g via INTRAVENOUS
  Filled 2023-01-26 (×4): qty 12.5

## 2023-01-26 NOTE — Assessment & Plan Note (Addendum)
Likely urosepsis. Urine culture + for gram negative rods. Blood culture NG1D.  - Continue Cefepime, will narrow when sensitivities are back
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
## 2023-01-26 NOTE — Progress Notes (Signed)
FMTS Brief Progress Note  S: Saw patient on rounds with Dr Elliot Gurney. Pt in bed using albuterol nebulizer Reports ongoing neck pain, headache, and eye pain/irritation with bright light. Denies abdominal pain, chest pain, and dyspnea. States that she does not want anyone to mess with her psychiatric medications. Explained that these were held initially due to cardiac concerns but were restarted earlier today. Does not want her neck to be touched. Thinks her abdomen is somewhat distended. Continues to have diarrhea.   O: BP 128/84 (BP Location: Left Arm)   Pulse 97   Temp 98.1 F (36.7 C) (Axillary)   Resp 19   Ht 5\' 5"  (1.651 m)   Wt 101.6 kg   SpO2 99%   BMI 37.28 kg/m   Physical Exam Constitutional:      Appearance: Normal appearance.  Eyes:     Pupils: Pupils are equal, round, and reactive to light.  Cardiovascular:     Rate and Rhythm: Normal rate and regular rhythm.     Heart sounds: Normal heart sounds.  Pulmonary:     Effort: Pulmonary effort is normal.     Breath sounds: Normal breath sounds.  Abdominal:     General: Bowel sounds are normal. There is distension.     Tenderness: There is no abdominal tenderness. There is no guarding or rebound.  Neurological:     Mental Status: She is alert.     Comments: BUE and BLE weakness consistent with quadraplegia  Psychiatric:        Attention and Perception: Attention and perception normal.        Behavior: Behavior normal.     A/P:  Sepsis, suspected 2/2 UTI Has been hemodynamically stable today and afebrile since 2am. Urine cultures grew >100k colonies of Ecoli. 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 Respiratory virus panel negative for COVID and influenza.  -Awaiting susceptibilities to narrow ABX -Continue cefepime for now. -CTM blood cultures  Diarrhea Pt told day team she has been having diarrhea for 2 months. No abdominal pain  currently but abdominal distention noted on physical exam -GI pathogen panel in process -KUB in AM -CTM I/Os  Acute on chronic anemia Appears to be anemia of chronic disease and iron deficiency anemia. Hgb 4.2 on arrival. S/p 3 PRBCs with most recent Hgb 7.7 -transfusion threshold <7.0 -PPI BID -repeat CBC in AM  Asthma Denies dyspnea, doing well on room air with albuterol nebs -continue albuterol nebs BID  AKI Creatinine 2.2 > 0.89 >0.74 most recently -AM BMP   Lorayne Bender, MD 01/26/2023, 10:23 PM PGY-1, [facility] Night Resident  Please page [phone] with questions.

## 2023-01-26 NOTE — Assessment & Plan Note (Addendum)
Patient with history of asthma but has not been on treatment for over 10 years.  Patient was originally placed on 4 L nasal cannula on arrival but now has been weaned down to room air.  Chest x-ray showed cardiomegaly but no pulmonary edema or consolidation. COVID test negative. - repeat cxr pending - albuterol nebs x2 today as patient is still wheezing

## 2023-01-26 NOTE — Assessment & Plan Note (Addendum)
Initial Hgb 4.2 on arrival to the ED. S/p 3 units of blood. Most recent Hg is 7.7. Iron studies show decreased Iron (12) with ferritin of 218. CBC and iron studies indicate likely mixed picture of IDA and AoCD. Patient has history of heavy menses.  - Transfusion threshold <7 - BID PPI for now - Not a good candidate for IV iron now with active infection, but could consider as she nears discharge - will discuss treatment for heavy menstrual bleeding with patient - will need O/P GI follow up with IDA for colon cancer screening.  - AM CBC

## 2023-01-26 NOTE — TOC Initial Note (Addendum)
Transition of Care [facility]) - Initial/Assessment Note    Patient Details  Name: Tracey Hall MRN: [id number] Date of Birth: [birth year]  [facility] CM/SW Contact:    Kingsley Plan, RN Phone Number: 01/26/2023, 2:03 PM  Clinical Narrative:                  Spoke to patient at bedside. Patient from home with significant other .   Patient has a power wheel chair at home. Significant other helps her with wound care at home.   Patient has not had home health for "over a decade".   PT recommending air mattress for bed . Patient has a Rize 15 inches base model at home. NCM  called the manufacture directly, they do not make an air mattress for her bed. Discussed with patient, she is willing to get a hospital bed with air mattress for home. DME company requesting wound care note with description, measurements and stage for insurance purposes . Secure chatted attending team for order . WOC ordered   Patient stated she is contact person for delivery of bed . Confirmed number and address.  Expected Discharge Plan: Home/Self Care Barriers to Discharge: Continued Medical Work up   Patient Goals and CMS Choice Patient states their goals for this hospitalization and ongoing recovery are:: to return to home CMS Medicare.gov Compare Post Acute Care list provided to:: Patient Choice offered to / list presented to : Patient [place] ownership interest in [facility].provided to:: Patient    Expected Discharge Plan and Services   Discharge Planning Services: CM Consult Post Acute Care Choice: Durable Medical Equipment Living arrangements for the past 2 months: Single Family Home                 DME Arranged: Hospital bed (air mattress) DME Agency: [employer] Date DME Agency Contacted: 01/26/23 Time DME Agency Contacted: [phone] Representative spoke with at DME Agency: Vaughan Basta HH Arranged: NA          Prior Living Arrangements/Services Living  arrangements for the past 2 months: Single Family Home Lives with:: Significant Other Patient language and need for interpreter reviewed:: Yes Do you feel safe going back to the place where you live?: Yes      Need for Family Participation in Patient Care: Yes (Comment) Care giver support system in place?: Yes (comment) Current home services: DME Criminal Activity/Legal Involvement Pertinent to Current Situation/Hospitalization: No - Comment as needed  Activities of Daily Living   ADL Screening (condition at time of admission) Patient's cognitive ability adequate to safely complete daily activities?: Yes Is the patient deaf or have difficulty hearing?: No Does the patient have difficulty seeing, even when wearing glasses/contacts?: No Does the patient have difficulty concentrating, remembering, or making decisions?: No Patient able to express need for assistance with ADLs?: Yes Does the patient have difficulty dressing or bathing?: Yes Independently performs ADLs?: No Communication: Independent Dressing (OT): Needs assistance Is this a change from baseline?: Pre-admission baseline Grooming: Needs assistance Is this a change from baseline?: Pre-admission baseline Feeding: Needs assistance Is this a change from baseline?: Pre-admission baseline Bathing: Needs assistance Is this a change from baseline?: Pre-admission baseline Toileting: Needs assistance Is this a change from baseline?: Pre-admission baseline In/Out Bed: Needs assistance Is this a change from baseline?: Pre-admission baseline Walks in Home: Needs assistance Does the patient have difficulty walking or climbing stairs?: Yes  Permission Sought/Granted   Permission granted to share information with :  No              Emotional Assessment Appearance:: Appears stated age Attitude/Demeanor/Rapport: Engaged Affect (typically observed): Accepting Orientation: : Oriented to Self, Oriented to Place, Oriented to  Time,  Oriented to Situation Alcohol / Substance Use: Not Applicable Psych Involvement: No (comment)  Admission diagnosis:  Sepsis (HCC) [A41.9] Patient Active Problem List   Diagnosis Date Noted   Diarrhea 01/26/2023   Altered mental status 01/25/2023   Headache 01/25/2023   Blurry vision, bilateral 09/14/2022   New persistent daily headache 09/14/2022   Intractable migraine without aura and without status migrainosus 12/29/2021   Severe episode of recurrent major depressive disorder, 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/29/2021   Current severe episode of major depressive disorder without psychotic features without prior episode (HCC) 11/18/2021   Microcytic anemia 11/02/2021   Dizziness 10/14/2021   Pressure injury of skin 09/18/2021   Pyelonephritis 09/17/2021   Wheelchair dependence 02/03/2021   UTI (urinary tract infection) 12/26/2020   Severe sepsis (HCC) 12/26/2020   Chronic anemia 12/26/2020   Chronic indwelling Foley catheter 12/26/2020   Abdominal pain, chronic, right lower quadrant 11/27/2020   Nerve pain 11/27/2020   Acute cystitis 10/12/2020   Nausea 10/12/2020   Abdominal pain 10/12/2020   Dehydration 10/12/2020   Hypotension 10/12/2020   Spasticity 05/13/2020   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   Sepsis secondary to UTI (HCC) 04/11/2019   Malaise and fatigue    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CC)    Anemia of chronic disease    Muscle spasm of both lower legs    Neurogenic bowel 02/26/2019   Debility 02/23/2019   Sepsis (HCC) 02/18/2019   Chronic pain syndrome 10/14/2018   Complication of feeding tube (HCC)    Intubation of airway performed without difficulty    Tachypnea    Peptic ulcer disease 08/03/2017   E coli bacteremia 08/03/2017   Sepsis due to urinary tract infection (HCC) 07/31/2017   Bullous dermatitis 10/13/2016   AKI (acute kidney injury) (HCC) 09/16/2016   Hyponatremia 09/16/2016   Hypokalemia 09/16/2016   Prolonged Q-T interval on ECG 09/16/2016    Obesity 02/19/2012   Recurrent UTI 09/15/2010   Asthma 09/15/2010   Fever 08/21/2010   Anxiety state 01/10/2010   Neurogenic bladder 01/04/2007   Acute on chronic anemia 12/06/2006   Major depressive disorder, recurrent episode (HCC) 08/19/2006   Quadriplegia (HCC) 08/19/2006   PCP:  Nestor Ramp, MD Pharmacy:   [id number] - [place], [place] - [address] AT [address] OF [address] [place] [address] Phone: [phone] Fax: [phone]  [id number] [place], [place] - [address] AT [facility] [address] [place] [address] Phone: [phone] Fax: [phone]     Social Determinants of Health (SDOH) Social History: SDOH Screenings   Depression (PHQ2-9): Low Risk  (09/14/2022)  Tobacco Use: Low Risk  (01/26/2023)   SDOH Interventions:     Readmission Risk Interventions     No data to display

## 2023-01-26 NOTE — Assessment & Plan Note (Addendum)
Patient complaining of back and neck pain.  -Continue home Gabapentin, hold this if her mental status declines  -Continue home Valium 5 mg twice daily as needed for spasms -Continue home Cymbalta 60 mg daily -Methadone 5mg  BID  -Oxycodone 5mg  q4h PRN for breakthrough pain -Home lidocaine patches for neck pain  -continue home tizanidine given QT is no longer prolonged

## 2023-01-26 NOTE — Assessment & Plan Note (Signed)
Likely secondary to sepsis as above.  She is mentating appropriately at present, just has been a bit more tired than usual with generalized malaise. per her and her family's report.  She is alert and oriented.  Not concerned for CNS infection at this time as she is able to move her neck, though with considerable pain given her chronic pain.  There is also some pupillary dilation to approximately 6 mm but they are reactive.  Suspect that this dilation is in the setting of having not had her home opioids for some time, possibly some element of mild withdrawal. Also consider her use of several potentially sedating meds contributing (Tizanidine, Valium, Gabapentin, Trazodone, Seroquel, opioids), but this seems less likely at present given that she has been on relatively stable doses for a while now.  -Treatment of her sepsis as above -If her somnolence worsens or she becomes confused, would obtain CT of the head

## 2023-01-26 NOTE — Progress Notes (Signed)
Patient is alert and oriented x4 w/bizarre behavior. Rambled about various topics unrelated to questions asked of her. She complained of neck spasms and received muscle relaxers for the pain. She refuses tele monitoring, methadone and q2h turns. She stated that her doctor told her that methadone caused her to have diarrhea which is why she does not want it. Denied additional needs.
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
## 2023-01-26 NOTE — Assessment & Plan Note (Addendum)
QTc 539 on admission. Repeat EKG this am showed Qtc of 489.  - will restart home Seroquel and Cymbalta    - Continue methadone 5 mg BID for now  Would not increase dose.  - Optimize electrolytes

## 2023-01-26 NOTE — Assessment & Plan Note (Addendum)
Creatinine of 2.2 on arrival. S/P 1L NS and 3 units pRBCs. Repeat Cr is 0.74. Resolved.  - replete electrolytes prn  - AM BMP
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
## 2023-01-26 NOTE — Progress Notes (Signed)
Had long discussion with patient regarding declining medical care. Patient reports she is tired of all of these medical conditions and the diarrhea has really been messing up her life. She states she is tired today and does not want to pursue any more medical intervention at this time. She says she is willing to get the KUB and more testing tomorrow. Discussed meeting with psych to optimize medications or palliative to discuss goals of care. Patient reports she wants to talk again tomorrow.

## 2023-01-26 NOTE — Assessment & Plan Note (Addendum)
Patient complaining of prolonged diarrhea (>2 months). Per chart review, patient has history of constipation but has been complaining of diarrhea recently. Patient thought it might be d/t medications but has no relief after stopping tylenol or methadone.  - KUB - f/u I/Os

## 2023-01-26 NOTE — Consult Note (Signed)
WOC nurse re-consulted for detailed note including measurements of wounds with staging.  See consult note from 01/25/2023 when seen in person by [facility] RN while in emergency room.   This RN and bedside RN rolled patient over to assess back, sacrum, coccyx, buttocks, ischium and no wounds were discovered.  Consult note from 01/25/2023 remains correct. The wounds patient does have do not appear to be related to pressure, L thigh wound is from rubbing of a incontinence brief per patient and L medial ankle wound appears to be from trauma.    WOC team will not follow at this time. Re-consult if further needs arise  Thank you,    Priscella Mann MSN, RN-BC, [employer] [phone]

## 2023-01-26 NOTE — Progress Notes (Signed)
Patient refusing mepilex to sacrum and repositioning, chest xray, telemetry monitor, and CHG. Provider aware.

## 2023-01-26 NOTE — Plan of Care (Signed)
  Problem: Education: Goal: Knowledge of General Education information will improve Description: Including pain rating scale, medication(s)/side effects and non-pharmacologic comfort measures Outcome: Progressing   Problem: Skin Integrity: Goal: Risk for impaired skin integrity will decrease Outcome: Progressing   

## 2023-01-27 ENCOUNTER — Inpatient Hospital Stay (HOSPITAL_COMMUNITY): Payer: Medicare Other

## 2023-01-27 DIAGNOSIS — D649 Anemia, unspecified: Secondary | ICD-10-CM

## 2023-01-27 LAB — BASIC METABOLIC PANEL
Anion gap: 11 (ref 5–15)
BUN: 8 mg/dL (ref 6–20)
CO2: 17 mmol/L — ABNORMAL LOW (ref 22–32)
Calcium: 7.9 mg/dL — ABNORMAL LOW (ref 8.9–10.3)
Chloride: 104 mmol/L (ref 98–111)
Creatinine, Ser: 0.6 mg/dL (ref 0.44–1.00)
GFR, Estimated: >60 mL/min (ref >60)
Glucose, Bld: 107 mg/dL — ABNORMAL HIGH (ref 70–99)
Potassium: 4.7 mmol/L (ref 3.5–5.1)
Sodium: 132 mmol/L — ABNORMAL LOW (ref 135–145)

## 2023-01-27 MED ORDER — LACTATED RINGERS IV SOLN: INTRAVENOUS | Status: AC

## 2023-01-27 MED ORDER — ALBUTEROL SULFATE (2.5 MG/3ML) 0.083% IN NEBU: 2.5000 mg | INHALATION_SOLUTION | RESPIRATORY_TRACT | Status: DC | PRN

## 2023-01-27 MED ORDER — SODIUM CHLORIDE 0.9 % IV SOLN
250.0000 mg | Freq: Once | INTRAVENOUS | Status: AC
  Administered 2023-01-27: 250 mg via INTRAVENOUS
  Filled 2023-01-27: qty 20

## 2023-01-27 MED ORDER — ALBUTEROL SULFATE HFA 108 (90 BASE) MCG/ACT IN AERS: 2.0000 | INHALATION_SPRAY | Freq: Four times a day (QID) | RESPIRATORY_TRACT | Status: DC | PRN

## 2023-01-27 MED ORDER — MEDROXYPROGESTERONE ACETATE 150 MG/ML IM SUSP
150.0000 mg | Freq: Once | INTRAMUSCULAR | Status: DC
  Filled 2023-01-27: qty 1

## 2023-01-27 MED ORDER — AMOXICILLIN 500 MG PO CAPS
500.0000 mg | ORAL_CAPSULE | Freq: Three times a day (TID) | ORAL | Status: DC
  Administered 2023-01-27 – 2023-01-29 (×6): 500 mg via ORAL
  Filled 2023-01-27 (×7): qty 1

## 2023-01-27 NOTE — Plan of Care (Signed)

## 2023-01-27 NOTE — Assessment & Plan Note (Signed)
Chronic.  Being cared for at home by her significant other Darrell. -WOC consult for wound care in house -RD consult for nutritional status, albumin 2.9
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
## 2023-01-27 NOTE — Assessment & Plan Note (Addendum)
Initial Hgb 4.2 on arrival to the ED. S/p 3 units of blood. Most recent Hg is 7.7. Iron studies show decreased Iron (12) with ferritin of 218. CBC and iron studies indicate likely mixed picture of IDA and AoCD. Patient has history of heavy menses. Patient reports she will follow up outpatient regarding menorrhagia management.  - Transfusion threshold <7 - BID PPI for now - will disuss IV iron with patient, start if agreeable as patients condition is improving and on Abx  - will need O/P GI follow up with IDA for colon cancer screening - AM CBC

## 2023-01-27 NOTE — Progress Notes (Signed)
Patient continues to refuse certain therapies still refusing to be place on cardiac monitor. Pt educated medical needs and Dr order for monitor. Continues to refuse.

## 2023-01-27 NOTE — Assessment & Plan Note (Addendum)
Likely urosepsis. Urine culture + for gram negative rods. Blood culture NG2D. Sensitivty to amoxicillin - will start amoxicillin 500mg  TID to end 8/11 (8/5- 8/11)

## 2023-01-27 NOTE — Progress Notes (Signed)
Patient continues to display bizarre behavior. Stated that she knew the phlebotomist and that the phlebotomist did not like her. The phlebotomist does not know her. Displaying signs of paranoia. Complained of a headache this morning and received oxy. Was changed x2 last night for a bowel movement. L-hip wound cleansed and dressing changed. Patient denied additional needs.

## 2023-01-27 NOTE — Assessment & Plan Note (Addendum)
Patient complaining of prolonged diarrhea (>2 months). GI panel positive for EPEC- which is usually treated with supportive measures.Per chart review, patient has history of constipation but has been complaining of diarrhea recently. Patient thought it might be d/t medications but has no relief after stopping tylenol or methadone.  - KUB today - rehydration with fluids- 100 mL/hr of LR - will do rectal exam today - trend I/Os

## 2023-01-27 NOTE — Assessment & Plan Note (Addendum)
Patient with history of asthma but has not been on treatment for over 10 years.  Patient was originally placed on 4 L nasal cannula on arrival but now has been weaned down to room air.  Chest x-ray showed cardiomegaly but no pulmonary edema or consolidation. COVID test negative. CXR negative for acute changes. Patient reports the albuterol nebs are helpful but would prefer inhaler.  - continue albuterol inhaler BID

## 2023-01-27 NOTE — Assessment & Plan Note (Signed)
Patient complaining of back and neck pain.  -Continue home Gabapentin, hold this if her mental status declines  -Continue home Valium 5 mg twice daily as needed for spasms -Continue home Cymbalta 60 mg daily -Methadone 5mg  BID  -Oxycodone 5mg  q4h PRN for breakthrough pain -Home lidocaine patches for neck pain  -continue home tizanidine given QT is no longer prolonged

## 2023-01-27 NOTE — Assessment & Plan Note (Addendum)
Per nursing, patient acting bizarre and paranoid overnight. On exam, she is hyper focused on diarrhea but redirectable and oriented. This may be multifactorial related to her sepsis, underlying anxiety disorder, and hospital acquired delirium.  - continue psych medications - will touch base with spouse regarding baseline - Treatment of her sepsis as above

## 2023-01-27 NOTE — Plan of Care (Signed)
Pt progressing refusing some care and treatments.

## 2023-01-28 ENCOUNTER — Telehealth (HOSPITAL_COMMUNITY): Payer: Self-pay | Admitting: Pharmacy Technician

## 2023-01-28 ENCOUNTER — Other Ambulatory Visit (HOSPITAL_COMMUNITY): Payer: Self-pay

## 2023-01-28 DIAGNOSIS — R197 Diarrhea, unspecified: Secondary | ICD-10-CM

## 2023-01-28 LAB — GLUCOSE, CAPILLARY: Glucose-Capillary: 85 mg/dL (ref 70–99)

## 2023-01-28 MED ORDER — ACETAMINOPHEN 325 MG PO TABS
650.0000 mg | ORAL_TABLET | Freq: Once | ORAL | Status: AC
  Administered 2023-01-28: 650 mg via ORAL
  Filled 2023-01-28: qty 2

## 2023-01-28 MED ORDER — LIDOCAINE 5 % EX PTCH
3.0000 | MEDICATED_PATCH | CUTANEOUS | 5 refills | Status: DC
  Filled 2023-01-28 – 2023-02-24 (×3): qty 90, 30d supply, fill #0
  Filled 2023-04-16: qty 90, 30d supply, fill #1
  Filled 2023-04-20 – 2023-05-13 (×2): qty 90, 30d supply, fill #2
  Filled 2023-05-26 – 2023-06-18 (×2): qty 90, 30d supply, fill #3
  Filled 2023-07-16: qty 90, 30d supply, fill #4

## 2023-01-28 MED ORDER — ACETAMINOPHEN 325 MG PO TABS: 650.0000 mg | ORAL_TABLET | Freq: Four times a day (QID) | ORAL | Status: DC | PRN

## 2023-01-28 MED ORDER — ALBUTEROL SULFATE (2.5 MG/3ML) 0.083% IN NEBU
2.5000 mg | INHALATION_SOLUTION | RESPIRATORY_TRACT | 12 refills | Status: DC | PRN
Start: 2023-01-28 — End: 2023-02-25
  Filled 2023-01-28 – 2023-02-17 (×4): qty 90, 5d supply, fill #0
  Filled 2023-02-24: qty 90, 5d supply, fill #1

## 2023-01-28 MED ORDER — MEDROXYPROGESTERONE ACETATE 150 MG/ML IM SUSP
150.0000 mg | Freq: Once | INTRAMUSCULAR | Status: AC
  Administered 2023-01-28: 150 mg via INTRAMUSCULAR
  Filled 2023-01-28: qty 1

## 2023-01-28 MED ORDER — POTASSIUM CHLORIDE CRYS ER 20 MEQ PO TBCR
40.0000 meq | EXTENDED_RELEASE_TABLET | Freq: Once | ORAL | Status: AC
  Administered 2023-01-28: 40 meq via ORAL
  Filled 2023-01-28: qty 2

## 2023-01-28 MED ORDER — AMOXICILLIN 500 MG PO CAPS
500.0000 mg | ORAL_CAPSULE | Freq: Three times a day (TID) | ORAL | 0 refills | Status: AC
  Filled 2023-01-28: qty 9, 3d supply, fill #0
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
## 2023-01-28 NOTE — Telephone Encounter (Signed)
Pharmacy Patient Advocate Encounter   Received notification that prior authorization for Lidocaine 5% patches is required/requested.   Insurance verification completed.   The patient is insured through [facility] .   Per test claim: PA required; PA submitted to [employer] via CoverMyMeds Key/confirmation #/EOC [id number] Status is pending

## 2023-01-28 NOTE — Telephone Encounter (Signed)
Pharmacy Patient Advocate Encounter   Received notification that prior authorization for Albuterol Sulfate (2.5 MG/3ML)0.083% nebulizer solutionis required/requested.   Insurance verification completed.   The patient is insured through [facility] .   Per test claim: PA required; PA submitted to [facility] via CoverMyMeds Key/confirmation #/EOC [id number] Status is pending

## 2023-01-28 NOTE — Telephone Encounter (Signed)
Pharmacy Patient Advocate Encounter  Received notification from [facility] that Prior Authorization for Albuterol Sulfate (2.5 MG/3ML)0.083% nebulizer solution  has been DENIED. Please advise how you'd like to proceed. Full denial letter will be uploaded to the media tab. See denial reason below. A nebulizer is a piece of durable medical equipment (DME). Drugs used with DME in the home are covered under Medicare Part B. Our records show that you do not live in a long term care (LTC) facility. We cannot pay for drugs under Medicare Part D if they are covered under Medicare Part A or B. We did not decide whether ALBUTEROL SULFATE Nebu Soln is medically necessary. We made our decision only on the fact that we cannot pay for the drug under Medicare Part D  PA #/Case ID/Reference #: [id number]

## 2023-01-28 NOTE — Care Management Important Message (Signed)
Important Message  Patient Details  Name: Tracey Hall MRN: [id number] Date of Birth: 1980-08-01   Medicare Important Message Given:  Yes Patient has a Contact Precaution order in place will mail a copy to the patient home address.      01/28/2023, 1:36 PM

## 2023-01-28 NOTE — Assessment & Plan Note (Signed)
Discussed baseline with spouse who reports since last year of her surgery she has been more depressed d/t increased neck pain. Nursing note stated patient was AxO overnight. Patient was not interested in speaking with psychology team or chaplain.  - continue psych medications - Treatment of her sepsis as above

## 2023-01-28 NOTE — Progress Notes (Addendum)
Patient is now alert and oriented x4. Patient was lethargic and drowsy upon shift start and was not able to take po meds.She did wake up around 0100 complaining of a headache. Gave her oxy for pain. She woke up again around 0500 crying with clammy and cool skin. Blood glucose was 85. She complained of severe neck pain and bp was elevated. Spoke with Dr. Dolan Amen regarding this and she stated to give patient another dose of prn medication. Gave pain med, cleaned patient up and changed bed (full linen change). Pain reassessment revealed that pain med was effective. Patient thanked staff for intervening. Denied additional needs.

## 2023-01-28 NOTE — Plan of Care (Signed)
  Problem: Clinical Measurements: Goal: Ability to maintain clinical measurements within normal limits will improve Outcome: Progressing Goal: Cardiovascular complication will be avoided Outcome: Progressing   Problem: Activity: Goal: Risk for activity intolerance will decrease Outcome: Progressing   Problem: Nutrition: Goal: Adequate nutrition will be maintained Outcome: Progressing   Problem: Elimination: Goal: Will not experience complications related to bowel motility Outcome: Progressing Goal: Will not experience complications related to urinary retention Outcome: Progressing   Problem: Pain Managment: Goal: General experience of comfort will improve Outcome: Progressing   Problem: Safety: Goal: Ability to remain free from injury will improve Outcome: Progressing   Problem: Skin Integrity: Goal: Risk for impaired skin integrity will decrease Outcome: Progressing

## 2023-01-28 NOTE — TOC Progression Note (Signed)
Transition of Care [facility]) - Progression Note    Patient Details  Name: Tracey Hall MRN: [id number] Date of Birth: September 01, 1980  [facility] CM/SW Contact  Harriet Masson, RN Phone Number: 01/28/2023, 2:16 PM  Clinical Narrative:    Vaughan Basta with [employer] stated patient's insurance has approved air mattress. Patient aware [employer] will reach out to schedule delivery.     Expected Discharge Plan: Home/Self Care Barriers to Discharge: Continued Medical Work up  Expected Discharge Plan and Services   Discharge Planning Services: CM Consult Post Acute Care Choice: Durable Medical Equipment Living arrangements for the past 2 months: Single Family Home                 DME Arranged: Hospital bed (air mattress) DME Agency: [employer] Date DME Agency Contacted: 01/26/23 Time DME Agency Contacted: [phone] Representative spoke with at DME Agency: Vaughan Basta HH Arranged: NA           Social Determinants of Health (SDOH) Interventions SDOH Screenings   Food Insecurity: No Food Insecurity (01/27/2023)  Housing: Low Risk  (01/27/2023)  Transportation Needs: No Transportation Needs (01/27/2023)  Utilities: Not At Risk (01/27/2023)  Depression (PHQ2-9): Low Risk  (09/14/2022)  Tobacco Use: Low Risk  (01/26/2023)    Readmission Risk Interventions     No data to display

## 2023-01-28 NOTE — Assessment & Plan Note (Addendum)
Patient complaining of prolonged diarrhea (>2 months). GI panel positive for EPEC. Patient given IV hydration 8/7 with 100 ml/hr LR for 12 hours. KUB negative for increased stool burden.  - pt difficult to arouse yesterday for rectal exam, will try again today  - trend I/Os

## 2023-01-28 NOTE — Telephone Encounter (Signed)
Pharmacy Patient Advocate Encounter  Received notification from [facility] that Prior Authorization for Lidocaine 5% patches  has been APPROVED from 01/28/2023 to 06/21/2098   PA #/Case ID/Reference #: [id number]

## 2023-01-28 NOTE — Assessment & Plan Note (Signed)
UA + for E coli with pansensitivity.  - continue amoxicillin 500mg  TID  (8/5- 8/11)

## 2023-01-28 NOTE — Discharge Instructions (Addendum)
Dear Tracey Hall,  Thank you for letting us participate in your care. You were hospitalized for decreased responsiveness and diagnosed with Sepsis (HCC) secondary to a urinary tract infection. You were treated with antibiotics. Please continue taking those antibiotics as instructed on the bottle. Your blood levels were also very low and we gave you 3 units of blood during your admission. We also gave you an albuterol inhaler to help with your asthma.   POST-HOSPITAL & CARE INSTRUCTIONS Please take your full course of antibiotics as instructed  Please follow up with your PCP regarding your heavy menstrual periods to discuss ways to decrease the amount of bleeding Go to your follow up appointments (listed below)   DOCTOR'S APPOINTMENT   Future Appointments  Date Time Provider Department Center  03/05/2023 10:40 AM Genice Rouge, MD [facility] CPR  04/16/2023  3:20 PM Genice Rouge, MD [facility] CPR  05/28/2023  3:20 PM Genice Rouge, MD [facility] CPR  07/09/2023  3:20 PM Lovorn, Aundra Millet, MD [facility] CPR     Take care and be well!  Family Medicine Teaching Service Inpatient Team [place]  [facility]  [address] [place], [place] [address] [phone]

## 2023-01-28 NOTE — Assessment & Plan Note (Signed)
Hg this AM was 8.2 Patient got IV iron yesterday. Initially agreed to depo shot for menorrhagia, but refused when nursing tried to administer. Will rediscuss with patient  - Transfusion threshold <7 - BID PPI for now  - will need O/P GI follow up with IDA for colon cancer screening
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
## 2023-01-29 DIAGNOSIS — R638 Other symptoms and signs concerning food and fluid intake: Secondary | ICD-10-CM | POA: Insufficient documentation

## 2023-01-29 NOTE — Assessment & Plan Note (Signed)
Patient alert and oriented on my exam this morning.  - continue psych medications - Treatment of her sepsis as above

## 2023-01-29 NOTE — Progress Notes (Signed)
Patient had AVS gone over with, answered all questions, removed ivs, Ptar called and picked up patient to be delivered to patients' home. Waited to 1500 before leaving due to patient not having anyone at home until 1430.

## 2023-01-29 NOTE — Progress Notes (Signed)
Brief Nutrition Follow-Up Note  Pt last seen by RD for Initial Nutrition Assessment on 8/06. At that time, pt was eating nothing at all. The only thing pt was consuming was water, juice, and soda. Pt had a meal tray at bedside at time of RD visit on 8/06 that was untouched. RD had attempted to feed pt, but pt refused all food offered. RD also brought in oral nutrition supplements for pt to try, all of which she declined due to not liking the taste. Pt reported to RD on 8/06 that she could not remember the last time she ate food at all.  RD reached out to [facility] Medicine Team on 8/06 to discuss concerns regarding lack of nutrition and high nutrition risk. Pt with a history of pressure injuries. Given spastic quadriplegia and bedbound status at baseline, pt is at high risk for redeveloping pressure injuries due to very poor nutrition, specifically minimal protein intake. RD recommended Cortrak placement and initiation of enteral nutrition if pt's PO intake did not improve and if this was within pt's GOC.  Pt continues to eat 0% at all meals this admission. With intake only of juice and soda, suspect pt is meeting <10% of estimated kcal and protein needs on a daily basis. Reached back out to [facility] Medicine Team to discuss concerns. Per Team, pt with difficulty self-feeding due to spastic quadriplegia. Team previously ordered feeding assistance with meals. Pt will likely discharge home today with significant other. Family Medicine Team reports that nutrition concerns have been discussed and addressed during rounds.  RD will continue to follow pt during admission.   Mertie Clause, MS, RD, LDN Registered Dietitian II Please see AMiON for contact information.

## 2023-01-29 NOTE — Assessment & Plan Note (Signed)
 UA + for E coli with pansensitivity.  - continue amoxicillin 500mg  TID  (8/5- 8/11)

## 2023-01-29 NOTE — Assessment & Plan Note (Signed)
Patient has reported she has been eating less and "can't remember the last time she ate" because of the diarrhea. However, when talking with spouse Nelma Rothman, he endorses patient does eat but that is picky and wont eat the hospital food. RD saw patient and at that time, patient also endorses not eating anything. Due to this, RD was concerned for patients nutrition status and chance of readmission (see RD note for full recs). Discussed with RD regarding collateral from husband. She reports that if patient is eating at home and is only not eating the food here, then enteral nutrition is not needed. This AM, patient states she has been eating and has no issues with the food, including the pancakes.  - RD consulted, appreciate recs - will discuss with patient and husband and encourage PO - will recommend patient follow up with RD outpatient if continues to have poor PO

## 2023-01-29 NOTE — TOC Transition Note (Signed)
Transition of Care [facility]) - CM/SW Discharge Note   Patient Details  Name: Tracey Hall MRN: [id number] Date of Birth: [birth year]  Transition of Care [facility]) CM/SW Contact:  Carley Hammed, LCSW Phone Number: 01/29/2023, 12:49 PM   Clinical Narrative:    Pt to be transported home [address] [place] [address] via [facility] at [facility]. Pt has been set up with DME earlier in the admission. Pt notified spouse of her discharge. Re consult TOC for any further needs.    Final next level of care: Home/Self Care Barriers to Discharge: Barriers Resolved   Patient Goals and CMS Choice CMS Medicare.gov Compare Post Acute Care list provided to:: Patient Choice offered to / list presented to : Patient  Discharge Placement                Patient chooses bed at:  [facility]) Patient to be transferred to facility by: [facility] Name of family member notified: Spouse Patient and family notified of of transfer: 01/29/23  Discharge Plan and Services Additional resources added to the After Visit Summary for     Discharge Planning Services: CM Consult Post Acute Care Choice: Durable Medical Equipment          DME Arranged: Hospital bed (air mattress) DME Agency: [employer] Date DME Agency Contacted: 01/26/23 Time DME Agency Contacted: [phone] Representative spoke with at DME Agency: [facility] HH Arranged: NA          Social Determinants of Health (SDOH) Interventions SDOH Screenings   Food Insecurity: No Food Insecurity (01/27/2023)  Housing: Low Risk  (01/27/2023)  Transportation Needs: No Transportation Needs (01/27/2023)  Utilities: Not At Risk (01/27/2023)  Depression (PHQ2-9): Low Risk  (09/14/2022)  Tobacco Use: Low Risk  (01/26/2023)     Readmission Risk Interventions     No data to display

## 2023-01-29 NOTE — Assessment & Plan Note (Signed)
Patient complaining of prolonged diarrhea (>2 months). GI panel positive for EPEC. Patient given IV hydration 8/7 with 100 ml/hr LR for 12 hours. KUB negative for increased stool burden. Patient denied rectal exam.  - trend I/Os

## 2023-01-29 NOTE — Plan of Care (Signed)

## 2023-01-29 NOTE — Assessment & Plan Note (Signed)
Last Hg 8.2 Patient got IV iron 8/7. Received depo shot 8/8.  - Transfusion threshold <7 - BID PPI for now  - will need O/P GI follow up with IDA for colon cancer screening
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
## 2023-02-01 ENCOUNTER — Telehealth: Payer: Self-pay

## 2023-02-01 NOTE — Transitions of Care (Post Inpatient/ED Visit) (Signed)
   02/01/2023  Name: Tracey Hall MRN: [id number] DOB: [birth year]  Today's TOC FU Call Status: Today's TOC FU Call Status:: Unsuccessful Call (1st Attempt) Unsuccessful Call (1st Attempt) Date: 02/01/23  Attempted to reach the patient regarding the most recent Inpatient/ED visit.  Follow Up Plan: Additional outreach attempts will be made to reach the patient to complete the Transitions of Care (Post Inpatient/ED visit) call.   Jodelle Gross, RN, BSN, CCM Care Management Coordinator [place] Phone: [phone]/Fax: [phone]

## 2023-02-02 ENCOUNTER — Ambulatory Visit: Payer: Medicare Other | Admitting: Family Medicine

## 2023-02-02 ENCOUNTER — Encounter: Payer: Self-pay | Admitting: Family Medicine

## 2023-02-02 ENCOUNTER — Telehealth: Payer: Self-pay

## 2023-02-02 DIAGNOSIS — R197 Diarrhea, unspecified: Secondary | ICD-10-CM

## 2023-02-02 NOTE — Transitions of Care (Post Inpatient/ED Visit) (Signed)
   02/02/2023  Name: KALENNA MATTHIS MRN: [id number] DOB: 08-17-1980  Today's TOC FU Call Status: Today's TOC FU Call Status:: Unsuccessful Call (2nd Attempt) Unsuccessful Call (2nd Attempt) Date: 02/02/23  Attempted to reach the patient regarding the most recent Inpatient/ED visit.  Follow Up Plan: Additional outreach attempts will be made to reach the patient to complete the Transitions of Care (Post Inpatient/ED visit) call.   Jodelle Gross, RN, BSN, CCM Care Management Coordinator [place] Phone: [phone]/Fax: [phone]

## 2023-02-02 NOTE — Progress Notes (Signed)
Tried connecting with her via phone twice. Unable to get hold of her. Will try again tomorrow. Denny Levy

## 2023-02-03 ENCOUNTER — Telehealth: Payer: Self-pay

## 2023-02-03 NOTE — Transitions of Care (Post Inpatient/ED Visit) (Signed)
   02/03/2023  Name: Tracey Hall MRN: [id number] DOB: August 18, 1980  Today's TOC FU Call Status: Today's TOC FU Call Status:: Unsuccessful Call (3rd Attempt) Unsuccessful Call (3rd Attempt) Date: 02/03/23  Attempted to reach the patient regarding the most recent Inpatient/ED visit.  Follow Up Plan: No further outreach attempts will be made at this time. We have been unable to contact the patient.  Jodelle Gross, RN, BSN, CCM Care Management Coordinator [place] Phone: [phone]/Fax: [phone]

## 2023-02-05 ENCOUNTER — Inpatient Hospital Stay: Payer: Medicare Other | Admitting: Family Medicine

## 2023-02-13 ENCOUNTER — Other Ambulatory Visit: Payer: Self-pay | Admitting: Physical Medicine and Rehabilitation

## 2023-02-14 ENCOUNTER — Other Ambulatory Visit (HOSPITAL_COMMUNITY): Payer: Self-pay

## 2023-02-15 ENCOUNTER — Other Ambulatory Visit (HOSPITAL_COMMUNITY): Payer: Self-pay

## 2023-02-15 MED ORDER — TIZANIDINE HCL 4 MG PO TABS
4.0000 mg | ORAL_TABLET | Freq: Three times a day (TID) | ORAL | 1 refills | Status: DC
  Filled 2023-02-15 – 2023-05-13 (×9): qty 270, 90d supply, fill #0
  Filled 2023-05-26: qty 270, 90d supply, fill #1

## 2023-02-17 ENCOUNTER — Ambulatory Visit: Payer: Medicare Other | Admitting: Physical Medicine and Rehabilitation

## 2023-02-17 ENCOUNTER — Other Ambulatory Visit (HOSPITAL_COMMUNITY): Payer: Self-pay

## 2023-02-17 ENCOUNTER — Telehealth: Payer: Medicare Other | Admitting: Physician Assistant

## 2023-02-17 DIAGNOSIS — R509 Fever, unspecified: Secondary | ICD-10-CM

## 2023-02-17 NOTE — Progress Notes (Signed)
Virtual Visit Consent   Tracey Hall, you are scheduled for a virtual visit with a [facility] provider today. Just as with appointments in the office, your consent must be obtained to participate. Your consent will be active for this visit and any virtual visit you may have with one of our providers in the next 365 days. If you have a MyChart account, a copy of this consent can be sent to you electronically.  As this is a virtual visit, video technology does not allow for your provider to perform a traditional examination. This may limit your provider's ability to fully assess your condition. If your provider identifies any concerns that need to be evaluated in person or the need to arrange testing (such as labs, EKG, etc.), we will make arrangements to do so. Although advances in technology are sophisticated, we cannot ensure that it will always work on either your end or our end. If the connection with a video visit is poor, the visit may have to be switched to a telephone visit. With either a video or telephone visit, we are not always able to ensure that we have a secure connection.  By engaging in this virtual visit, you consent to the provision of healthcare and authorize for your insurance to be billed (if applicable) for the services provided during this visit. Depending on your insurance coverage, you may receive a charge related to this service.  I need to obtain your verbal consent now. Are you willing to proceed with your visit today? Tracey Hall has provided verbal consent on 02/17/2023 for a virtual visit (video or telephone). Tracey Hall, [place]  Date: 02/17/2023 4:16 PM  Virtual Visit via Video Note   I, Tracey Hall, connected with  Tracey Hall  ([id number], Nov 02, 1980) on 02/17/23 at  4:00 PM EDT by a video-enabled telemedicine application and verified that I am speaking with the correct person using two identifiers.  Location: Patient: Virtual Visit Location  Patient: Home Provider: Virtual Visit Location Provider: Home Office   I discussed the limitations of evaluation and management by telemedicine and the availability of in person appointments. The patient expressed understanding and agreed to proceed.    History of Present Illness: Tracey Hall is a 42 y.o. who identifies as a female who was assigned female at birth, and is being seen today for continued fever, fatigue, aches and new onset of blister of ankle at site of prior surgical procedure after her recent hospitalization for urosepsis. States Tmax 102. Denies any other new symptoms but concerns infection is still present.    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   Malaise and fatigue    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CC)    Anemia of chronic disease    Muscle spasm of both lower legs    Neurogenic bowel 02/26/2019   Debility 02/23/2019   Chronic pain syndrome 10/14/2018   Tachypnea    Peptic ulcer disease 08/03/2017   Prolonged Q-T interval on ECG 09/16/2016   Recurrent UTI 09/15/2010   Asthma 09/15/2010   Anxiety state 01/10/2010   Neurogenic bladder 01/04/2007  Acute on chronic anemia 12/06/2006   Major depressive disorder, recurrent episode (HCC) 08/19/2006   Quadriplegia (HCC) 08/19/2006    Allergies:  Allergies  Allergen Reactions   Cortisone Rash    Severe rash    Hydrocortisone    Latex Rash   Medications:  Current Outpatient Medications:    albuterol (PROVENTIL) (2.5 MG/3ML) 0.083% nebulizer solution, Take 3 mLs (2.5 mg total) by nebulization every 4  (four) hours as needed for wheezing or shortness of breath., Disp: 90 mL, Rfl: 12   citalopram (CELEXA) 20 MG tablet, Take 1 tablet (20 mg total) by mouth daily., Disp: 30 tablet, Rfl: 5   diazepam (VALIUM) 5 MG tablet, Take 1 tablet (5 mg total) by mouth every 12 (twelve) hours as needed for muscle spasms., Disp: 60 tablet, Rfl: 5   DULoxetine (CYMBALTA) 60 MG capsule, Take 1 capsule (60 mg total) by mouth daily. For neuropathic pain- Spinal cord injury, Disp: 30 capsule, Rfl: 5   fludrocortisone (FLORINEF) 0.1 MG tablet, Take 1 tablet (0.1 mg total) by mouth daily., Disp: 30 tablet, Rfl: 5   gabapentin (NEURONTIN) 600 MG tablet, Take 1 tablet (600 mg total) by mouth 3 (three) times daily. X 1 week, then 1200 mg 2x/day- for neuropathic pain, Disp: 120 tablet, Rfl: 5   lidocaine (LIDODERM) 5 %, Place 3 patches onto the skin daily. Remove & Discard patch within 12 hours or as directed by MD due to supersensitivity of skin-  since syrinx surgery with neuropathic pain, Disp: 90 patch, Rfl: 5   methadone (DOLOPHINE) 5 MG tablet, Take 1 tablet (5 mg total) by mouth every 12 (twelve) hours. For nerve and chronic pain-, Disp: 60 tablet, Rfl: 0   midodrine (PROAMATINE) 2.5 MG tablet, Take 1 tablet (2.5 mg total) by mouth 3 (three) times daily with meals., Disp: 90 tablet, Rfl: 5   oxybutynin (DITROPAN) 5 MG tablet, TAKE 1 TABLET (5 MG TOTAL) BY MOUTH 3 (THREE) TIMES DAILY. FOR BLADDER SPASMS, Disp: 90 tablet, Rfl: 5   QUEtiapine (SEROQUEL) 25 MG tablet, TAKE 1 TABLET (25 MG TOTAL) BY MOUTH 2 (TWO) TIMES DAILY. AS NEEDED TO SLEEP AND CALM THINGS DOWN, Disp: 60 tablet, Rfl: 5   SUMAtriptan (IMITREX) 100 MG tablet, TAKE 1 TABLET (100 MG TOTAL) BY MOUTH EVERY 2 (TWO) HOURS AS NEEDED FOR MIGRAINE. MAY REPEAT IN 2 HOURS IF HEADACHE PERSISTS OR RECURS. MAX 2 PILLS/DAY- START WITHIN 15 MINUTES OF HEADACHE STARTING, Disp: 15 tablet, Rfl: 5   tiZANidine (ZANAFLEX) 4 MG tablet, Take 1 tablet (4 mg total) by mouth 3 (three)  times daily., Disp: 270 tablet, Rfl: 1   topiramate (TOPAMAX) 25 MG tablet, Take 1 tablet (25 mg total) by mouth at bedtime. X 1 week, then 50 mg at bedtime x 1 week, then 75 mg at bedtime x 1 week, then 100 mg at bedtime- for daily HA's-, Disp: 120 tablet, Rfl: 5  Observations/Objective: Patient is well-developed, well-nourished in no acute distress.  Resting comfortably at home.  Head is normocephalic, atraumatic.  No labored breathing. Speech is clear and coherent with logical content.  Patient is alert and oriented at baseline.   Assessment and Plan: 1. Febrile illness  Needs in person evaluation giving recent hospitalization. She agrees to be evaluated in person this evening at [facility] to start. ER precautions reviewed.   Follow Up Instructions: I discussed the assessment and treatment plan with the patient. The patient was provided an opportunity to ask questions and all were answered. The  patient agreed with the plan and demonstrated an understanding of the instructions.  A copy of instructions were sent to the patient via MyChart unless otherwise noted below.   The patient was advised to call back or seek an in-person evaluation if the symptoms worsen or if the condition fails to improve as anticipated.  Time:  I spent 10 minutes with the patient via telehealth technology discussing the above problems/concerns.    Tracey Climes, PA-C

## 2023-02-17 NOTE — Patient Instructions (Addendum)
Tracey Hall, thank you for joining [name], PA-C for today's virtual visit.  While this provider is not your primary care provider (PCP), if your PCP is located in our provider database this encounter information will be shared with them immediately following your visit.   A [facility] MyChart account gives you access to today's visit and all your visits, tests, and labs performed at [facility] " click here if you don't have a [facility] MyChart account or go to [email or web address]  Consent: (Patient) Tracey Hall provided verbal consent for this virtual visit at the beginning of the encounter.  Current Medications:  Current Outpatient Medications:    albuterol (PROVENTIL) (2.5 MG/3ML) 0.083% nebulizer solution, Take 3 mLs (2.5 mg total) by nebulization every 4 (four) hours as needed for wheezing or shortness of breath., Disp: 90 mL, Rfl: 12   citalopram (CELEXA) 20 MG tablet, Take 1 tablet (20 mg total) by mouth daily., Disp: 30 tablet, Rfl: 5   diazepam (VALIUM) 5 MG tablet, Take 1 tablet (5 mg total) by mouth every 12 (twelve) hours as needed for muscle spasms., Disp: 60 tablet, Rfl: 5   DULoxetine (CYMBALTA) 60 MG capsule, Take 1 capsule (60 mg total) by mouth daily. For neuropathic pain- Spinal cord injury, Disp: 30 capsule, Rfl: 5   fludrocortisone (FLORINEF) 0.1 MG tablet, Take 1 tablet (0.1 mg total) by mouth daily., Disp: 30 tablet, Rfl: 5   gabapentin (NEURONTIN) 600 MG tablet, Take 1 tablet (600 mg total) by mouth 3 (three) times daily. X 1 week, then 1200 mg 2x/day- for neuropathic pain, Disp: 120 tablet, Rfl: 5   lidocaine (LIDODERM) 5 %, Place 3 patches onto the skin daily. Remove & Discard patch within 12 hours or as directed by MD due to supersensitivity of skin-  since syrinx surgery with neuropathic pain, Disp: 90 patch, Rfl: 5   methadone (DOLOPHINE) 5 MG tablet, Take 1 tablet (5 mg total) by mouth every 12 (twelve) hours. For nerve and  chronic pain-, Disp: 60 tablet, Rfl: 0   midodrine (PROAMATINE) 2.5 MG tablet, Take 1 tablet (2.5 mg total) by mouth 3 (three) times daily with meals., Disp: 90 tablet, Rfl: 5   oxybutynin (DITROPAN) 5 MG tablet, TAKE 1 TABLET (5 MG TOTAL) BY MOUTH 3 (THREE) TIMES DAILY. FOR BLADDER SPASMS, Disp: 90 tablet, Rfl: 5   QUEtiapine (SEROQUEL) 25 MG tablet, TAKE 1 TABLET (25 MG TOTAL) BY MOUTH 2 (TWO) TIMES DAILY. AS NEEDED TO SLEEP AND CALM THINGS DOWN, Disp: 60 tablet, Rfl: 5   SUMAtriptan (IMITREX) 100 MG tablet, TAKE 1 TABLET (100 MG TOTAL) BY MOUTH EVERY 2 (TWO) HOURS AS NEEDED FOR MIGRAINE. MAY REPEAT IN 2 HOURS IF HEADACHE PERSISTS OR RECURS. MAX 2 PILLS/DAY- START WITHIN 15 MINUTES OF HEADACHE STARTING, Disp: 15 tablet, Rfl: 5   tiZANidine (ZANAFLEX) 4 MG tablet, Take 1 tablet (4 mg total) by mouth 3 (three) times daily., Disp: 270 tablet, Rfl: 1   topiramate (TOPAMAX) 25 MG tablet, Take 1 tablet (25 mg total) by mouth at bedtime. X 1 week, then 50 mg at bedtime x 1 week, then 75 mg at bedtime x 1 week, then 100 mg at bedtime- for daily HA's-, Disp: 120 tablet, Rfl: 5   Medications ordered in this encounter:  No orders of the defined types were placed in this encounter.    *If you need refills on other medications prior to your next appointment, please contact your pharmacy*  Follow-Up: Call back  or seek an in-person evaluation if the symptoms worsen or if the condition fails to improve as anticipated.  [facility] [phone]  Other Instructions  If you have been instructed to have an in-person evaluation today at a local Urgent Care facility, please use the link below. It will take you to a list of all of our available [facility], including address, phone number and hours of operation. Please do not delay care.  [facility]  If you or a family member do not have a primary care provider, use the link below to schedule a visit and establish care.  When you choose a [facility] primary care physician or advanced practice provider, you gain a long-term partner in health. Find a Primary Care Provider  Learn more about [facility]'s in-office and virtual care options:  - Get Care Now

## 2023-02-18 ENCOUNTER — Other Ambulatory Visit: Payer: Self-pay

## 2023-02-24 ENCOUNTER — Other Ambulatory Visit: Payer: Self-pay

## 2023-02-24 ENCOUNTER — Other Ambulatory Visit: Payer: Self-pay | Admitting: Family Medicine

## 2023-02-24 ENCOUNTER — Other Ambulatory Visit (HOSPITAL_COMMUNITY): Payer: Self-pay

## 2023-02-25 ENCOUNTER — Other Ambulatory Visit (HOSPITAL_COMMUNITY): Payer: Self-pay

## 2023-02-25 ENCOUNTER — Other Ambulatory Visit: Payer: Self-pay

## 2023-02-25 MED ORDER — ALBUTEROL SULFATE HFA 108 (90 BASE) MCG/ACT IN AERS
2.0000 | INHALATION_SPRAY | Freq: Four times a day (QID) | RESPIRATORY_TRACT | 2 refills | Status: DC | PRN
  Filled 2023-02-25: qty 18, 25d supply, fill #0
  Filled 2023-03-18: qty 18, 25d supply, fill #1
  Filled 2023-05-26: qty 18, 25d supply, fill #2

## 2023-02-26 ENCOUNTER — Other Ambulatory Visit: Payer: Self-pay

## 2023-03-05 ENCOUNTER — Encounter
Payer: Medicare Other | Attending: Physical Medicine and Rehabilitation | Admitting: Physical Medicine and Rehabilitation

## 2023-03-05 DIAGNOSIS — G4452 New daily persistent headache (NDPH): Secondary | ICD-10-CM | POA: Insufficient documentation

## 2023-03-05 DIAGNOSIS — G950 Syringomyelia and syringobulbia: Secondary | ICD-10-CM | POA: Insufficient documentation

## 2023-03-05 DIAGNOSIS — M792 Neuralgia and neuritis, unspecified: Secondary | ICD-10-CM | POA: Insufficient documentation

## 2023-03-05 DIAGNOSIS — G8250 Quadriplegia, unspecified: Secondary | ICD-10-CM | POA: Insufficient documentation

## 2023-03-05 DIAGNOSIS — R252 Cramp and spasm: Secondary | ICD-10-CM | POA: Insufficient documentation

## 2023-03-05 DIAGNOSIS — Z993 Dependence on wheelchair: Secondary | ICD-10-CM | POA: Insufficient documentation

## 2023-03-10 ENCOUNTER — Telehealth: Payer: Medicare Other | Admitting: Family Medicine

## 2023-03-10 ENCOUNTER — Encounter: Payer: Self-pay | Admitting: Family Medicine

## 2023-03-10 DIAGNOSIS — R399 Unspecified symptoms and signs involving the genitourinary system: Secondary | ICD-10-CM

## 2023-03-10 DIAGNOSIS — F32A Depression, unspecified: Secondary | ICD-10-CM | POA: Diagnosis not present

## 2023-03-10 DIAGNOSIS — G4700 Insomnia, unspecified: Secondary | ICD-10-CM

## 2023-03-10 DIAGNOSIS — F339 Major depressive disorder, recurrent, unspecified: Secondary | ICD-10-CM

## 2023-03-10 DIAGNOSIS — Z978 Presence of other specified devices: Secondary | ICD-10-CM

## 2023-03-10 DIAGNOSIS — R197 Diarrhea, unspecified: Secondary | ICD-10-CM | POA: Diagnosis present

## 2023-03-10 DIAGNOSIS — N390 Urinary tract infection, site not specified: Secondary | ICD-10-CM

## 2023-03-10 MED ORDER — FLORASTOR ADVANCED PO CAPS: 1.0000 | ORAL_CAPSULE | Freq: Two times a day (BID) | ORAL | 3 refills | Status: DC

## 2023-03-10 MED ORDER — CEPHALEXIN 500 MG PO CAPS: 500.0000 mg | ORAL_CAPSULE | Freq: Three times a day (TID) | ORAL | 0 refills | Status: DC

## 2023-03-10 NOTE — Progress Notes (Signed)
[facility] Telemedicine Visit  Patient consented to have virtual visit and was identified by name and date of birth. Method of visit: Video  Encounter participants: Patient: Tracey Hall - located at home Provider: Denny Levy - located at office Others (if applicable): None  Chief Complaint: Continued diarrhea 2.  Depressed mood 3.  Insomnia  HPI:  Since returning from hospital she has continued to have diarrhea unchanged. 2.  Feels like she is getting her UTI back as she is having the same type of symptoms. 3.  The diarrhea is really getting on her nerves in her mood has been more depressed than usual.  She is not getting out of the house at all.  Also she is not eating as much because she wants to prevent diarrhea. 4.  Insomnia: This has been a intermittent but longstanding problem for her.  Difficulty both getting to sleep and staying asleep.  She is prescribed Seroquel as needed for this but that the dose does not really do much for her.  ROS: per HPI  Pertinent PMHx: Paraplegia  Exam:  There were no vitals taken for this visit.  Respiratory: Nonlabored PSYCH: AxOx4. Appropriate speech fluency and content. Asks and answers questions appropriately. Mood is depressed and intermittently tearful.   Assessment/Plan: #1.  Diarrhea: Unclear etiology.  She certainly a lot of medicines that could contribute.  Will try adding Florastor.  She will follow-up with me by MyChart message in 10 to 14 days.  If that is not improving things, could consider Colestid. 2.  Insomnia: Reviewed her medicines with her.  I would hesitate to add a medicine as most of her medications are managed by her PMR doctor so I will send her a message and I have urged Ms. Dalesandro to get in contact with her. 3.  Depressed mood: I understand that her diarrhea is really affecting her mood.  I will focus right now on trying to get this GI problem solved. 4.  UTI symptoms in a patient with known  neurogenic bladder issues: Will go ahead and empirically treat her with Keflex.  Her last urine culture showed Escherichia coli which was sensitive to most of antibiotics including cephalexin.  If her symptoms do not resolve, would have her see if she can give [place] a urine sample.  She will make an appointment to see me in about 4 weeks and will contact me via MyChart for the above. No problem-specific Assessment & Plan notes found for this encounter.    Time spent during visit with patient: 32 minutes

## 2023-03-11 ENCOUNTER — Encounter: Payer: Self-pay | Admitting: Family Medicine

## 2023-03-13 ENCOUNTER — Other Ambulatory Visit (HOSPITAL_COMMUNITY): Payer: Self-pay

## 2023-03-14 ENCOUNTER — Other Ambulatory Visit (HOSPITAL_COMMUNITY): Payer: Self-pay

## 2023-03-15 ENCOUNTER — Other Ambulatory Visit: Payer: Self-pay

## 2023-03-17 ENCOUNTER — Ambulatory Visit: Payer: Medicare Other

## 2023-03-18 ENCOUNTER — Other Ambulatory Visit: Payer: Self-pay

## 2023-03-18 ENCOUNTER — Other Ambulatory Visit (HOSPITAL_COMMUNITY): Payer: Self-pay

## 2023-03-23 ENCOUNTER — Encounter: Payer: Self-pay | Admitting: Physical Medicine and Rehabilitation

## 2023-03-23 ENCOUNTER — Other Ambulatory Visit (HOSPITAL_COMMUNITY): Payer: Self-pay

## 2023-03-23 MED ORDER — METHADONE HCL 5 MG PO TABS
5.0000 mg | ORAL_TABLET | Freq: Two times a day (BID) | ORAL | 0 refills | Status: DC
  Filled 2023-03-23: qty 60, 30d supply, fill #0

## 2023-03-24 ENCOUNTER — Other Ambulatory Visit: Payer: Self-pay

## 2023-03-25 ENCOUNTER — Other Ambulatory Visit (HOSPITAL_COMMUNITY): Payer: Self-pay

## 2023-03-26 ENCOUNTER — Other Ambulatory Visit: Payer: Self-pay

## 2023-03-26 ENCOUNTER — Other Ambulatory Visit (HOSPITAL_COMMUNITY): Payer: Self-pay

## 2023-03-26 ENCOUNTER — Other Ambulatory Visit: Payer: Self-pay | Admitting: Physical Medicine and Rehabilitation

## 2023-03-26 MED ORDER — GABAPENTIN 600 MG PO TABS
1200.0000 mg | ORAL_TABLET | Freq: Two times a day (BID) | ORAL | 5 refills | Status: DC
  Filled 2023-03-26: qty 120, 30d supply, fill #0

## 2023-03-26 MED ORDER — FLUDROCORTISONE ACETATE 0.1 MG PO TABS
ORAL_TABLET | ORAL | 5 refills | Status: DC
  Filled 2023-03-26: qty 60, 30d supply, fill #0

## 2023-03-26 MED ORDER — DULOXETINE HCL 30 MG PO CPEP
30.0000 mg | ORAL_CAPSULE | Freq: Every day | ORAL | 5 refills | Status: DC
  Filled 2023-03-26: qty 30, 30d supply, fill #0
  Filled 2023-03-30 – 2023-04-20 (×3): qty 30, 30d supply, fill #1
  Filled 2023-05-26: qty 30, 30d supply, fill #2

## 2023-03-26 MED ORDER — LIDOCAINE 5 % EX PTCH
MEDICATED_PATCH | CUTANEOUS | 5 refills | Status: DC
  Filled 2023-04-16: qty 90, 90d supply, fill #0
  Filled 2023-04-20 – 2023-06-18 (×2): qty 90, fill #0
  Filled 2023-07-02 – 2023-07-06 (×2): qty 90, 90d supply, fill #0
  Filled 2023-07-14: qty 90, fill #0
  Filled 2023-08-24: qty 90, 90d supply, fill #0
  Filled 2023-09-07: qty 30, 30d supply, fill #0

## 2023-03-26 MED ORDER — TOPIRAMATE 25 MG PO TABS
ORAL_TABLET | ORAL | 5 refills | Status: DC
  Filled 2023-03-26: qty 120, 30d supply, fill #0

## 2023-03-26 MED ORDER — MIDODRINE HCL 2.5 MG PO TABS
2.5000 mg | ORAL_TABLET | Freq: Three times a day (TID) | ORAL | 5 refills | Status: DC
  Filled 2023-03-26 – 2023-04-16 (×2): qty 90, 30d supply, fill #0
  Filled 2023-04-20: qty 90, 30d supply, fill #1

## 2023-03-26 MED ORDER — SUMATRIPTAN SUCCINATE 100 MG PO TABS
ORAL_TABLET | ORAL | 5 refills | Status: DC
  Filled 2023-03-26: qty 9, 30d supply, fill #0
  Filled 2023-04-16 – 2023-04-20 (×2): qty 9, 30d supply, fill #1
  Filled 2023-05-26: qty 9, 30d supply, fill #2

## 2023-03-26 MED ORDER — CITALOPRAM HYDROBROMIDE 20 MG PO TABS
20.0000 mg | ORAL_TABLET | Freq: Every day | ORAL | 5 refills | Status: DC
  Filled 2023-04-16 – 2023-06-29 (×4): qty 30, 30d supply, fill #0
  Filled 2023-07-02 – 2023-07-23 (×6): qty 30, 30d supply, fill #1
  Filled 2023-08-24 – 2023-10-21 (×3): qty 30, 30d supply, fill #2

## 2023-03-29 ENCOUNTER — Other Ambulatory Visit: Payer: Self-pay

## 2023-03-29 ENCOUNTER — Other Ambulatory Visit: Payer: Self-pay | Admitting: Physical Medicine and Rehabilitation

## 2023-03-29 ENCOUNTER — Other Ambulatory Visit (HOSPITAL_COMMUNITY): Payer: Self-pay

## 2023-03-29 MED ORDER — FLUDROCORTISONE ACETATE 0.1 MG PO TABS
0.1000 mg | ORAL_TABLET | Freq: Every day | ORAL | 1 refills | Status: DC
  Filled 2023-03-29 – 2023-06-18 (×5): qty 90, 90d supply, fill #0
  Filled 2023-07-02 – 2023-08-24 (×6): qty 90, 90d supply, fill #1
  Filled 2023-09-07: qty 30, 30d supply, fill #1
  Filled 2023-09-07: qty 90, 90d supply, fill #1
  Filled 2023-09-22 – 2023-09-29 (×3): qty 30, 30d supply, fill #2

## 2023-03-29 MED ORDER — FLUDROCORTISONE ACETATE 0.1 MG PO TABS
ORAL_TABLET | ORAL | 5 refills | Status: DC
  Filled 2023-03-29: qty 60, fill #0
  Filled 2023-04-16: qty 60, 30d supply, fill #0

## 2023-03-29 MED ORDER — TOPIRAMATE 25 MG PO TABS
ORAL_TABLET | ORAL | 5 refills | Status: DC
  Filled 2023-03-29: qty 120, fill #0

## 2023-03-29 MED ORDER — TOPIRAMATE 25 MG PO TABS
ORAL_TABLET | ORAL | 5 refills | Status: DC
  Filled 2023-03-29: qty 120, 28d supply, fill #0
  Filled 2023-03-30: qty 120, 30d supply, fill #1
  Filled 2023-04-16: qty 120, fill #1
  Filled 2023-04-20: qty 120, 30d supply, fill #1
  Filled 2023-05-13: qty 120, 30d supply, fill #2
  Filled 2023-05-26: qty 120, 30d supply, fill #3

## 2023-03-30 ENCOUNTER — Other Ambulatory Visit (HOSPITAL_COMMUNITY): Payer: Self-pay

## 2023-03-30 ENCOUNTER — Other Ambulatory Visit: Payer: Self-pay | Admitting: Physical Medicine and Rehabilitation

## 2023-03-30 ENCOUNTER — Encounter (HOSPITAL_COMMUNITY): Payer: Self-pay

## 2023-03-30 ENCOUNTER — Other Ambulatory Visit: Payer: Self-pay

## 2023-04-01 ENCOUNTER — Other Ambulatory Visit (HOSPITAL_COMMUNITY): Payer: Self-pay

## 2023-04-01 ENCOUNTER — Other Ambulatory Visit: Payer: Self-pay

## 2023-04-01 MED ORDER — DIAZEPAM 5 MG PO TABS
5.0000 mg | ORAL_TABLET | Freq: Two times a day (BID) | ORAL | 5 refills | Status: DC | PRN
  Filled 2023-04-01 – 2023-04-16 (×2): qty 60, 30d supply, fill #0
  Filled 2023-04-20 – 2023-05-26 (×2): qty 60, 30d supply, fill #1
  Filled 2023-06-18 – 2023-07-02 (×2): qty 60, 30d supply, fill #2
  Filled 2023-07-06 – 2023-08-04 (×6): qty 60, 30d supply, fill #3
  Filled 2023-08-05: qty 60, 30d supply, fill #4
  Filled 2023-08-11 – 2023-09-07 (×4): qty 60, 30d supply, fill #3
  Filled 2023-09-22: qty 60, 30d supply, fill #4

## 2023-04-09 ENCOUNTER — Other Ambulatory Visit: Payer: Self-pay | Admitting: Physical Medicine and Rehabilitation

## 2023-04-09 ENCOUNTER — Ambulatory Visit: Payer: Medicare Other

## 2023-04-12 ENCOUNTER — Other Ambulatory Visit: Payer: Self-pay | Admitting: Physical Medicine and Rehabilitation

## 2023-04-12 ENCOUNTER — Other Ambulatory Visit: Payer: Self-pay | Admitting: Physical Medicine & Rehabilitation

## 2023-04-16 ENCOUNTER — Encounter: Payer: Self-pay | Admitting: Physical Medicine and Rehabilitation

## 2023-04-16 ENCOUNTER — Other Ambulatory Visit: Payer: Self-pay | Admitting: Family Medicine

## 2023-04-16 ENCOUNTER — Other Ambulatory Visit: Payer: Self-pay

## 2023-04-16 ENCOUNTER — Other Ambulatory Visit: Payer: Self-pay | Admitting: Physical Medicine and Rehabilitation

## 2023-04-16 ENCOUNTER — Encounter
Payer: Medicare Other | Attending: Physical Medicine and Rehabilitation | Admitting: Physical Medicine and Rehabilitation

## 2023-04-16 VITALS — BP 137/92 | HR 89 | Ht 65.0 in | Wt 224.0 lb

## 2023-04-16 DIAGNOSIS — M792 Neuralgia and neuritis, unspecified: Secondary | ICD-10-CM

## 2023-04-16 DIAGNOSIS — R9431 Abnormal electrocardiogram [ECG] [EKG]: Secondary | ICD-10-CM | POA: Diagnosis not present

## 2023-04-16 DIAGNOSIS — G894 Chronic pain syndrome: Secondary | ICD-10-CM | POA: Diagnosis not present

## 2023-04-16 DIAGNOSIS — Z79891 Long term (current) use of opiate analgesic: Secondary | ICD-10-CM

## 2023-04-16 DIAGNOSIS — G4452 New daily persistent headache (NDPH): Secondary | ICD-10-CM | POA: Diagnosis not present

## 2023-04-16 DIAGNOSIS — G8250 Quadriplegia, unspecified: Secondary | ICD-10-CM | POA: Diagnosis not present

## 2023-04-16 DIAGNOSIS — Z993 Dependence on wheelchair: Secondary | ICD-10-CM

## 2023-04-16 DIAGNOSIS — Z5181 Encounter for therapeutic drug level monitoring: Secondary | ICD-10-CM

## 2023-04-16 MED ORDER — HYDROCODONE-ACETAMINOPHEN 10-325 MG PO TABS
1.0000 | ORAL_TABLET | Freq: Four times a day (QID) | ORAL | 0 refills | Status: DC | PRN
  Filled 2023-04-16: qty 120, 30d supply, fill #0

## 2023-04-16 MED ORDER — GABAPENTIN 600 MG PO TABS
1200.0000 mg | ORAL_TABLET | Freq: Two times a day (BID) | ORAL | 5 refills | Status: DC
  Filled 2023-04-16: qty 120, 30d supply, fill #0
  Filled 2023-04-20 – 2023-05-26 (×2): qty 120, 30d supply, fill #1
  Filled 2023-06-18 – 2023-06-29 (×2): qty 120, 30d supply, fill #2
  Filled 2023-07-06 – 2023-07-23 (×5): qty 120, 30d supply, fill #3
  Filled 2023-08-11 – 2023-09-07 (×4): qty 120, 30d supply, fill #4
  Filled 2023-09-27 – 2023-09-29 (×2): qty 120, 30d supply, fill #5

## 2023-04-16 MED ORDER — METHADONE HCL 5 MG PO TABS
5.0000 mg | ORAL_TABLET | Freq: Two times a day (BID) | ORAL | 0 refills | Status: DC
  Filled 2023-04-16 – 2023-04-20 (×2): qty 60, 30d supply, fill #0
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
## 2023-04-17 ENCOUNTER — Other Ambulatory Visit (HOSPITAL_COMMUNITY): Payer: Self-pay

## 2023-04-18 ENCOUNTER — Other Ambulatory Visit: Payer: Self-pay

## 2023-04-18 MED ORDER — CEPHALEXIN 500 MG PO CAPS
500.0000 mg | ORAL_CAPSULE | Freq: Three times a day (TID) | ORAL | 0 refills | Status: DC
  Filled 2023-04-18: qty 21, 7d supply, fill #0

## 2023-04-19 ENCOUNTER — Encounter: Payer: Self-pay | Admitting: Pharmacist

## 2023-04-19 ENCOUNTER — Other Ambulatory Visit: Payer: Self-pay

## 2023-04-20 ENCOUNTER — Other Ambulatory Visit: Payer: Self-pay | Admitting: Physical Medicine and Rehabilitation

## 2023-04-20 ENCOUNTER — Other Ambulatory Visit: Payer: Self-pay

## 2023-04-20 ENCOUNTER — Other Ambulatory Visit: Payer: Self-pay | Admitting: Family Medicine

## 2023-04-20 ENCOUNTER — Other Ambulatory Visit (HOSPITAL_COMMUNITY): Payer: Self-pay

## 2023-04-20 MED ORDER — CEPHALEXIN 500 MG PO CAPS
500.0000 mg | ORAL_CAPSULE | Freq: Three times a day (TID) | ORAL | 0 refills | Status: DC
  Filled 2023-04-20 – 2023-05-06 (×3): qty 21, 7d supply, fill #0

## 2023-04-21 LAB — DRUG TOX ALC METAB W/CON, ORAL FLD: Alcohol Metabolite: NEGATIVE ng/mL (ref <25)

## 2023-04-21 LAB — DRUG TOX MONITOR 1 W/CONF, ORAL FLD
Alprazolam: NEGATIVE ng/mL (ref <0.50)
Amphetamines: NEGATIVE ng/mL (ref <10)
Barbiturates: NEGATIVE ng/mL (ref <10)
Benzodiazepines: POSITIVE ng/mL — AB (ref <0.50)
Buprenorphine: NEGATIVE ng/mL (ref <0.10)
Chlordiazepoxide: NEGATIVE ng/mL (ref <0.50)
Clonazepam: NEGATIVE ng/mL (ref <0.50)
Cocaine: NEGATIVE ng/mL (ref <5.0)
Diazepam: NEGATIVE ng/mL (ref <0.50)
EDDP: NEGATIVE ng/mL (ref <5.0)
Fentanyl: NEGATIVE ng/mL (ref <0.10)
Flunitrazepam: NEGATIVE ng/mL (ref <0.50)
Flurazepam: NEGATIVE ng/mL (ref <0.50)
Heroin Metabolite: NEGATIVE ng/mL (ref <1.0)
Lorazepam: NEGATIVE ng/mL (ref <0.50)
MARIJUANA: NEGATIVE ng/mL (ref <2.5)
MDMA: NEGATIVE ng/mL (ref <10)
Meprobamate: NEGATIVE ng/mL (ref <2.5)
Methadone: 12 ng/mL — ABNORMAL HIGH (ref <5.0)
Methadone: POSITIVE ng/mL — AB (ref <5.0)
Midazolam: NEGATIVE ng/mL (ref <0.50)
Nicotine Metabolite: NEGATIVE ng/mL (ref <5.0)
Nordiazepam: 1.98 ng/mL — ABNORMAL HIGH (ref <0.50)
Opiates: NEGATIVE ng/mL (ref <2.5)
Oxazepam: NEGATIVE ng/mL (ref <0.50)
Phencyclidine: NEGATIVE ng/mL (ref <10)
Tapentadol: NEGATIVE ng/mL (ref <5.0)
Temazepam: NEGATIVE ng/mL (ref <0.50)
Tramadol: NEGATIVE ng/mL (ref <5.0)
Triazolam: NEGATIVE ng/mL (ref <0.50)
Zolpidem: NEGATIVE ng/mL (ref <5.0)

## 2023-04-22 ENCOUNTER — Other Ambulatory Visit (HOSPITAL_COMMUNITY): Payer: Self-pay

## 2023-04-22 MED ORDER — DULOXETINE HCL 60 MG PO CPEP
60.0000 mg | ORAL_CAPSULE | Freq: Every day | ORAL | 5 refills | Status: DC
  Filled 2023-04-22 – 2023-04-27 (×4): qty 30, 30d supply, fill #0
  Filled 2023-05-26: qty 30, 30d supply, fill #1
  Filled 2023-06-18: qty 30, 30d supply, fill #2
  Filled 2023-07-02 – 2023-07-19 (×4): qty 30, 30d supply, fill #3
  Filled 2023-08-11 – 2023-09-07 (×4): qty 30, 30d supply, fill #4
  Filled 2023-09-27 – 2023-09-29 (×2): qty 30, 30d supply, fill #5

## 2023-04-22 MED ORDER — OXYBUTYNIN CHLORIDE 5 MG PO TABS
5.0000 mg | ORAL_TABLET | Freq: Three times a day (TID) | ORAL | 1 refills | Status: DC
  Filled 2023-04-22 – 2023-04-23 (×3): qty 270, 90d supply, fill #0
  Filled 2023-04-27 – 2023-07-24 (×7): qty 270, 90d supply, fill #1

## 2023-04-22 MED ORDER — QUETIAPINE FUMARATE 25 MG PO TABS
25.0000 mg | ORAL_TABLET | Freq: Two times a day (BID) | ORAL | 5 refills | Status: DC | PRN
  Filled 2023-04-22 – 2023-04-27 (×3): qty 60, 30d supply, fill #0
  Filled 2023-05-26: qty 60, 30d supply, fill #1
  Filled 2023-06-18: qty 60, 30d supply, fill #2
  Filled 2023-07-02 – 2023-07-16 (×5): qty 60, 30d supply, fill #3

## 2023-04-23 ENCOUNTER — Other Ambulatory Visit (HOSPITAL_COMMUNITY): Payer: Self-pay

## 2023-04-23 ENCOUNTER — Other Ambulatory Visit: Payer: Self-pay

## 2023-04-27 ENCOUNTER — Other Ambulatory Visit (HOSPITAL_COMMUNITY): Payer: Self-pay

## 2023-04-28 ENCOUNTER — Telehealth: Payer: Medicare Other | Admitting: Family Medicine

## 2023-04-28 ENCOUNTER — Other Ambulatory Visit: Payer: Self-pay

## 2023-05-06 ENCOUNTER — Other Ambulatory Visit: Payer: Self-pay

## 2023-05-11 ENCOUNTER — Other Ambulatory Visit (HOSPITAL_COMMUNITY): Payer: Self-pay

## 2023-05-13 ENCOUNTER — Other Ambulatory Visit (HOSPITAL_COMMUNITY): Payer: Self-pay

## 2023-05-13 ENCOUNTER — Other Ambulatory Visit: Payer: Self-pay

## 2023-05-14 ENCOUNTER — Other Ambulatory Visit: Payer: Self-pay

## 2023-05-26 ENCOUNTER — Other Ambulatory Visit: Payer: Self-pay

## 2023-05-26 ENCOUNTER — Other Ambulatory Visit: Payer: Self-pay | Admitting: Physical Medicine and Rehabilitation

## 2023-05-26 ENCOUNTER — Other Ambulatory Visit: Payer: Self-pay | Admitting: Family Medicine

## 2023-05-26 ENCOUNTER — Other Ambulatory Visit (HOSPITAL_COMMUNITY): Payer: Self-pay

## 2023-05-26 MED ORDER — CEPHALEXIN 500 MG PO CAPS
500.0000 mg | ORAL_CAPSULE | Freq: Three times a day (TID) | ORAL | 0 refills | Status: DC
  Filled 2023-05-26: qty 21, 7d supply, fill #0

## 2023-05-26 MED ORDER — HYDROCODONE-ACETAMINOPHEN 10-325 MG PO TABS
1.0000 | ORAL_TABLET | Freq: Four times a day (QID) | ORAL | 0 refills | Status: DC | PRN
  Filled 2023-05-26: qty 120, 30d supply, fill #0

## 2023-05-26 MED ORDER — METHADONE HCL 5 MG PO TABS
5.0000 mg | ORAL_TABLET | Freq: Two times a day (BID) | ORAL | 0 refills | Status: DC
  Filled 2023-05-26: qty 60, 30d supply, fill #0

## 2023-05-26 MED ORDER — MIDODRINE HCL 2.5 MG PO TABS
2.5000 mg | ORAL_TABLET | Freq: Three times a day (TID) | ORAL | 5 refills | Status: DC
  Filled 2023-05-26 – 2023-09-07 (×11): qty 90, 30d supply, fill #0
  Filled 2023-09-18: qty 60, 20d supply, fill #0
  Filled 2023-09-22: qty 90, 30d supply, fill #0
  Filled 2023-09-27: qty 90, 30d supply, fill #1

## 2023-05-26 NOTE — Telephone Encounter (Signed)
Patient requesting refill   Filled  Written  ID  Drug  QTY  Days  Prescriber  RX #  Dispenser  Refill  Daily Dose*  Pymt Type  PMP  04/21/2023 04/16/2023 2  Methadone Hcl 5 Mg Tablet 60.00 30 Me Lov [id number] Con (0126) 0/0 30.00 MME Other [place] 04/19/2023 04/16/2023 2  Hydrocodone-Acetamin 10-325 Mg 120.00 30 Me Lov [id number] Con (0126) 0/0 40.00 MME Other 

## 2023-05-28 ENCOUNTER — Other Ambulatory Visit: Payer: Self-pay

## 2023-05-28 ENCOUNTER — Other Ambulatory Visit (HOSPITAL_COMMUNITY): Payer: Self-pay

## 2023-05-28 ENCOUNTER — Encounter
Payer: Medicare Other | Attending: Physical Medicine and Rehabilitation | Admitting: Physical Medicine and Rehabilitation

## 2023-05-28 ENCOUNTER — Encounter: Payer: Self-pay | Admitting: Physical Medicine and Rehabilitation

## 2023-05-28 VITALS — HR 75 | Ht 65.0 in

## 2023-05-28 DIAGNOSIS — Z993 Dependence on wheelchair: Secondary | ICD-10-CM | POA: Diagnosis not present

## 2023-05-28 DIAGNOSIS — G894 Chronic pain syndrome: Secondary | ICD-10-CM | POA: Insufficient documentation

## 2023-05-28 DIAGNOSIS — F332 Major depressive disorder, recurrent severe without psychotic features: Secondary | ICD-10-CM | POA: Insufficient documentation

## 2023-05-28 DIAGNOSIS — I959 Hypotension, unspecified: Secondary | ICD-10-CM | POA: Diagnosis not present

## 2023-05-28 DIAGNOSIS — G8250 Quadriplegia, unspecified: Secondary | ICD-10-CM | POA: Diagnosis not present

## 2023-05-28 DIAGNOSIS — G4452 New daily persistent headache (NDPH): Secondary | ICD-10-CM | POA: Insufficient documentation

## 2023-05-28 DIAGNOSIS — G904 Autonomic dysreflexia: Secondary | ICD-10-CM | POA: Insufficient documentation

## 2023-05-28 MED ORDER — HYDROCODONE-ACETAMINOPHEN 10-325 MG PO TABS
1.0000 | ORAL_TABLET | Freq: Four times a day (QID) | ORAL | 0 refills | Status: DC | PRN
  Filled 2023-05-28 – 2023-07-02 (×3): qty 120, 30d supply, fill #0

## 2023-05-28 MED ORDER — SUMATRIPTAN SUCCINATE 100 MG PO TABS
ORAL_TABLET | ORAL | 5 refills | Status: DC
  Filled 2023-05-28: qty 9, 30d supply, fill #0
  Filled 2023-06-18: qty 15, fill #0
  Filled 2023-06-29: qty 9, 30d supply, fill #0
  Filled 2023-07-06 – 2023-07-16 (×3): qty 9, 30d supply, fill #1
  Filled 2023-07-19: qty 18, 20d supply, fill #1
  Filled 2023-08-12: qty 18, 20d supply, fill #2
  Filled 2023-08-24: qty 18, 20d supply, fill #3
  Filled 2023-08-26: qty 18, 28d supply, fill #3
  Filled 2023-09-07 – 2023-09-22 (×2): qty 18, 28d supply, fill #4
  Filled 2023-09-22 – 2023-10-20 (×3): qty 18, 28d supply, fill #5
  Filled 2023-11-08: qty 18, 28d supply, fill #6
  Filled 2023-11-09: qty 9, 15d supply, fill #6
  Filled 2023-11-10: qty 9, 14d supply, fill #6

## 2023-05-28 MED ORDER — METHADONE HCL 5 MG PO TABS
5.0000 mg | ORAL_TABLET | Freq: Two times a day (BID) | ORAL | 0 refills | Status: DC
  Filled 2023-05-28 – 2023-07-02 (×3): qty 60, 30d supply, fill #0

## 2023-05-28 MED ORDER — TOPIRAMATE 50 MG PO TABS
150.0000 mg | ORAL_TABLET | Freq: Every day | ORAL | 1 refills | Status: DC
  Filled 2023-05-28: qty 270, 90d supply, fill #0
  Filled 2023-06-18 – 2023-08-27 (×9): qty 270, 90d supply, fill #1
  Filled ????-??-??: fill #1

## 2023-05-28 MED ORDER — CITALOPRAM HYDROBROMIDE 20 MG PO TABS
20.0000 mg | ORAL_TABLET | Freq: Every day | ORAL | 5 refills | Status: DC
  Filled 2023-05-28: qty 30, 30d supply, fill #0
  Filled 2023-06-18 – 2023-09-07 (×8): qty 30, 30d supply, fill #1
  Filled 2023-09-22 – 2023-09-29 (×3): qty 30, 30d supply, fill #2
  Filled 2023-10-02 – 2023-11-12 (×6): qty 30, 30d supply, fill #3
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
## 2023-05-29 ENCOUNTER — Other Ambulatory Visit (HOSPITAL_COMMUNITY): Payer: Self-pay

## 2023-05-31 ENCOUNTER — Other Ambulatory Visit: Payer: Self-pay

## 2023-05-31 ENCOUNTER — Other Ambulatory Visit (HOSPITAL_COMMUNITY): Payer: Self-pay

## 2023-06-02 ENCOUNTER — Telehealth: Payer: Self-pay

## 2023-06-02 NOTE — Telephone Encounter (Signed)
[employer] has requested more documentation for the cathter order. They will need the dx, how long the use will be, frequency of use, and how often?   Please do an addendum.  Thank you.  Call back phone [phone].

## 2023-06-11 ENCOUNTER — Other Ambulatory Visit: Payer: Self-pay | Admitting: Physical Medicine and Rehabilitation

## 2023-06-11 NOTE — Telephone Encounter (Signed)
Note faxed.

## 2023-06-18 ENCOUNTER — Other Ambulatory Visit: Payer: Self-pay | Admitting: Physical Medicine and Rehabilitation

## 2023-06-18 ENCOUNTER — Other Ambulatory Visit: Payer: Self-pay | Admitting: Family Medicine

## 2023-06-18 ENCOUNTER — Other Ambulatory Visit: Payer: Self-pay

## 2023-06-18 MED ORDER — ALBUTEROL SULFATE HFA 108 (90 BASE) MCG/ACT IN AERS
2.0000 | INHALATION_SPRAY | Freq: Four times a day (QID) | RESPIRATORY_TRACT | 2 refills | Status: DC | PRN
  Filled 2023-06-18: qty 18, 25d supply, fill #0
  Filled 2023-07-02 – 2023-07-06 (×2): qty 18, 25d supply, fill #1
  Filled 2023-07-19: qty 18, 25d supply, fill #2

## 2023-06-18 MED ORDER — CEPHALEXIN 500 MG PO CAPS
500.0000 mg | ORAL_CAPSULE | Freq: Three times a day (TID) | ORAL | 0 refills | Status: DC
  Filled 2023-06-18: qty 21, 7d supply, fill #0

## 2023-06-20 ENCOUNTER — Encounter: Payer: Self-pay | Admitting: Family Medicine

## 2023-06-28 ENCOUNTER — Encounter: Payer: Self-pay | Admitting: Family Medicine

## 2023-06-28 DIAGNOSIS — G8250 Quadriplegia, unspecified: Secondary | ICD-10-CM

## 2023-06-28 DIAGNOSIS — M62838 Other muscle spasm: Secondary | ICD-10-CM

## 2023-06-28 DIAGNOSIS — G904 Autonomic dysreflexia: Secondary | ICD-10-CM

## 2023-06-29 ENCOUNTER — Other Ambulatory Visit (HOSPITAL_COMMUNITY): Payer: Self-pay

## 2023-06-29 ENCOUNTER — Other Ambulatory Visit: Payer: Self-pay

## 2023-06-29 ENCOUNTER — Other Ambulatory Visit: Payer: Self-pay | Admitting: Physical Medicine and Rehabilitation

## 2023-07-01 ENCOUNTER — Other Ambulatory Visit (HOSPITAL_COMMUNITY): Payer: Self-pay

## 2023-07-02 ENCOUNTER — Other Ambulatory Visit: Payer: Self-pay | Admitting: Physical Medicine and Rehabilitation

## 2023-07-02 ENCOUNTER — Other Ambulatory Visit: Payer: Self-pay

## 2023-07-02 ENCOUNTER — Other Ambulatory Visit: Payer: Self-pay | Admitting: Family Medicine

## 2023-07-05 ENCOUNTER — Other Ambulatory Visit: Payer: Self-pay

## 2023-07-05 MED ORDER — CEPHALEXIN 500 MG PO CAPS
500.0000 mg | ORAL_CAPSULE | Freq: Three times a day (TID) | ORAL | 0 refills | Status: DC
  Filled 2023-07-05: qty 21, 7d supply, fill #0

## 2023-07-06 ENCOUNTER — Other Ambulatory Visit: Payer: Self-pay

## 2023-07-06 ENCOUNTER — Other Ambulatory Visit (HOSPITAL_COMMUNITY): Payer: Self-pay

## 2023-07-06 ENCOUNTER — Other Ambulatory Visit: Payer: Self-pay | Admitting: Physical Medicine and Rehabilitation

## 2023-07-07 ENCOUNTER — Other Ambulatory Visit: Payer: Self-pay

## 2023-07-09 ENCOUNTER — Encounter: Payer: Medicare Other | Admitting: Physical Medicine and Rehabilitation

## 2023-07-12 ENCOUNTER — Encounter: Payer: Self-pay | Admitting: Physical Medicine and Rehabilitation

## 2023-07-12 ENCOUNTER — Encounter
Payer: Medicare Other | Attending: Physical Medicine and Rehabilitation | Admitting: Physical Medicine and Rehabilitation

## 2023-07-12 ENCOUNTER — Other Ambulatory Visit (HOSPITAL_COMMUNITY): Payer: Self-pay

## 2023-07-12 ENCOUNTER — Other Ambulatory Visit: Payer: Self-pay

## 2023-07-12 DIAGNOSIS — Z993 Dependence on wheelchair: Secondary | ICD-10-CM | POA: Diagnosis not present

## 2023-07-12 DIAGNOSIS — G8250 Quadriplegia, unspecified: Secondary | ICD-10-CM | POA: Insufficient documentation

## 2023-07-12 DIAGNOSIS — G894 Chronic pain syndrome: Secondary | ICD-10-CM | POA: Insufficient documentation

## 2023-07-12 DIAGNOSIS — M542 Cervicalgia: Secondary | ICD-10-CM | POA: Insufficient documentation

## 2023-07-12 DIAGNOSIS — G904 Autonomic dysreflexia: Secondary | ICD-10-CM | POA: Insufficient documentation

## 2023-07-12 DIAGNOSIS — G950 Syringomyelia and syringobulbia: Secondary | ICD-10-CM | POA: Insufficient documentation

## 2023-07-12 DIAGNOSIS — R197 Diarrhea, unspecified: Secondary | ICD-10-CM | POA: Insufficient documentation

## 2023-07-12 DIAGNOSIS — K592 Neurogenic bowel, not elsewhere classified: Secondary | ICD-10-CM | POA: Insufficient documentation

## 2023-07-12 DIAGNOSIS — G4452 New daily persistent headache (NDPH): Secondary | ICD-10-CM | POA: Insufficient documentation

## 2023-07-12 DIAGNOSIS — G8929 Other chronic pain: Secondary | ICD-10-CM | POA: Insufficient documentation

## 2023-07-12 MED ORDER — METHADONE HCL 5 MG PO TABS
5.0000 mg | ORAL_TABLET | Freq: Two times a day (BID) | ORAL | 0 refills | Status: DC
  Filled 2023-07-12 – 2023-07-19 (×4): qty 60, 30d supply, fill #0

## 2023-07-12 MED ORDER — HYDROCODONE-ACETAMINOPHEN 10-325 MG PO TABS
1.0000 | ORAL_TABLET | Freq: Four times a day (QID) | ORAL | 0 refills | Status: DC | PRN
  Filled 2023-07-12 – 2023-07-19 (×4): qty 120, 30d supply, fill #0

## 2023-07-12 MED ORDER — DULOXETINE HCL 60 MG PO CPEP: 60.0000 mg | ORAL_CAPSULE | Freq: Every day | ORAL | 5 refills | Status: DC

## 2023-07-12 MED ORDER — DULOXETINE HCL 30 MG PO CPEP
30.0000 mg | ORAL_CAPSULE | Freq: Every day | ORAL | 5 refills | Status: DC
  Filled 2023-07-12: qty 30, 30d supply, fill #0
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
## 2023-07-13 ENCOUNTER — Other Ambulatory Visit: Payer: Self-pay

## 2023-07-14 ENCOUNTER — Other Ambulatory Visit: Payer: Self-pay

## 2023-07-14 ENCOUNTER — Encounter: Payer: Self-pay | Admitting: Physical Medicine and Rehabilitation

## 2023-07-16 ENCOUNTER — Other Ambulatory Visit: Payer: Self-pay

## 2023-07-19 ENCOUNTER — Other Ambulatory Visit (HOSPITAL_COMMUNITY): Payer: Self-pay

## 2023-07-19 ENCOUNTER — Other Ambulatory Visit: Payer: Self-pay

## 2023-07-19 ENCOUNTER — Telehealth: Payer: Self-pay | Admitting: *Deleted

## 2023-07-19 NOTE — Progress Notes (Signed)
Complex Care Management Note Care Guide Note  07/19/2023 Name: Tracey Hall MRN: [id number] DOB: 1980/07/12   Complex Care Management Outreach Attempts: An unsuccessful telephone outreach was attempted today to offer the patient information about available complex care management services.  Follow Up Plan:  Additional outreach attempts will be made to offer the patient complex care management information and services.   Encounter Outcome:  No Answer  Gwenevere Ghazi  [facility]  [facility], [facility] Guide  Direct Dial: [phone]  Fax [phone]

## 2023-07-20 ENCOUNTER — Other Ambulatory Visit (HOSPITAL_COMMUNITY): Payer: Self-pay

## 2023-07-20 MED ORDER — QUETIAPINE FUMARATE 50 MG PO TABS: 50.0000 mg | ORAL_TABLET | Freq: Two times a day (BID) | ORAL | 5 refills | Status: DC | PRN

## 2023-07-20 NOTE — Progress Notes (Signed)
Complex Care Management Note Care Guide Note  07/20/2023 Name: Tracey Hall MRN: [id number] DOB: [birth year]   Complex Care Management Outreach Attempts: A second unsuccessful outreach was attempted today to offer the patient with information about available complex care management services.  Follow Up Plan:  Additional outreach attempts will be made to offer the patient complex care management information and services.   Encounter Outcome:  No Answer  Gwenevere Ghazi  [facility]  [facility] Guide  Direct Dial: [phone]  Fax [phone]

## 2023-07-23 ENCOUNTER — Other Ambulatory Visit: Payer: Self-pay

## 2023-07-23 NOTE — Progress Notes (Signed)
Complex Care Management Note Care Guide Note  07/23/2023 Name: Tracey Hall MRN: [id number] DOB: 14-Jun-1981   Complex Care Management Outreach Attempts: A third unsuccessful outreach was attempted today to offer the patient with information about available complex care management services.  Follow Up Plan:  No further outreach attempts will be made at this time. We have been unable to contact the patient to offer or enroll patient in complex care management services.  Encounter Outcome:  No Answer  Gwenevere Ghazi  [facility]  [facility], [facility] Guide  Direct Dial: [phone]  Fax [phone]

## 2023-07-29 ENCOUNTER — Other Ambulatory Visit: Payer: Self-pay

## 2023-08-04 ENCOUNTER — Other Ambulatory Visit (HOSPITAL_COMMUNITY): Payer: Self-pay

## 2023-08-04 ENCOUNTER — Other Ambulatory Visit: Payer: Self-pay

## 2023-08-06 ENCOUNTER — Other Ambulatory Visit: Payer: Self-pay

## 2023-08-10 ENCOUNTER — Other Ambulatory Visit (HOSPITAL_COMMUNITY): Payer: Self-pay

## 2023-08-11 ENCOUNTER — Other Ambulatory Visit: Payer: Self-pay | Admitting: Physical Medicine and Rehabilitation

## 2023-08-11 ENCOUNTER — Other Ambulatory Visit (HOSPITAL_COMMUNITY): Payer: Self-pay

## 2023-08-12 ENCOUNTER — Other Ambulatory Visit (HOSPITAL_COMMUNITY): Payer: Self-pay

## 2023-08-12 ENCOUNTER — Other Ambulatory Visit: Payer: Self-pay

## 2023-08-12 MED ORDER — OXYBUTYNIN CHLORIDE 5 MG PO TABS
5.0000 mg | ORAL_TABLET | Freq: Three times a day (TID) | ORAL | 1 refills | Status: DC
  Filled 2023-08-24 – 2023-09-07 (×4): qty 270, 90d supply, fill #0
  Filled 2023-09-29: qty 90, 30d supply, fill #0
  Filled 2023-10-12 – 2023-10-21 (×2): qty 90, 30d supply, fill #1
  Filled 2023-11-08 – 2023-11-16 (×4): qty 90, 30d supply, fill #2
  Filled 2023-12-16 – 2023-12-21 (×2): qty 90, 30d supply, fill #3
  Filled 2024-01-07 – 2024-01-18 (×4): qty 90, 30d supply, fill #4
  Filled 2024-02-01 – 2024-02-22 (×3): qty 90, 30d supply, fill #5
  Filled ????-??-??: fill #0

## 2023-08-13 ENCOUNTER — Other Ambulatory Visit: Payer: Self-pay | Admitting: Family Medicine

## 2023-08-15 MED ORDER — CEPHALEXIN 500 MG PO CAPS
500.0000 mg | ORAL_CAPSULE | Freq: Three times a day (TID) | ORAL | 0 refills | Status: DC
  Filled 2023-08-15 – 2023-08-16 (×2): qty 21, 7d supply, fill #0

## 2023-08-16 ENCOUNTER — Other Ambulatory Visit: Payer: Self-pay

## 2023-08-16 ENCOUNTER — Encounter: Payer: Self-pay | Admitting: Pharmacist

## 2023-08-16 ENCOUNTER — Other Ambulatory Visit (HOSPITAL_COMMUNITY): Payer: Self-pay

## 2023-08-17 ENCOUNTER — Other Ambulatory Visit (HOSPITAL_COMMUNITY): Payer: Self-pay

## 2023-08-17 MED ORDER — SERTRALINE HCL 50 MG PO TABS
50.0000 mg | ORAL_TABLET | Freq: Two times a day (BID) | ORAL | 5 refills | Status: DC | PRN
  Filled 2023-08-24: qty 60, 30d supply, fill #0
  Filled 2023-09-07: qty 60, 30d supply, fill #1

## 2023-08-20 ENCOUNTER — Encounter: Payer: Medicare Other | Admitting: Physical Medicine and Rehabilitation

## 2023-08-24 ENCOUNTER — Other Ambulatory Visit (HOSPITAL_COMMUNITY): Payer: Self-pay

## 2023-08-24 ENCOUNTER — Other Ambulatory Visit: Payer: Self-pay

## 2023-08-24 ENCOUNTER — Other Ambulatory Visit: Payer: Self-pay | Admitting: Physical Medicine and Rehabilitation

## 2023-08-24 ENCOUNTER — Other Ambulatory Visit: Payer: Self-pay | Admitting: Family Medicine

## 2023-08-24 MED ORDER — HYDROCODONE-ACETAMINOPHEN 10-325 MG PO TABS
1.0000 | ORAL_TABLET | Freq: Four times a day (QID) | ORAL | 0 refills | Status: DC | PRN
  Filled 2023-08-24 – 2023-09-07 (×2): qty 120, 30d supply, fill #0

## 2023-08-24 MED ORDER — METHADONE HCL 5 MG PO TABS
5.0000 mg | ORAL_TABLET | Freq: Two times a day (BID) | ORAL | 0 refills | Status: DC
  Filled 2023-08-24 – 2023-09-07 (×2): qty 60, 30d supply, fill #0

## 2023-08-24 MED ORDER — ALBUTEROL SULFATE HFA 108 (90 BASE) MCG/ACT IN AERS
2.0000 | INHALATION_SPRAY | Freq: Four times a day (QID) | RESPIRATORY_TRACT | 2 refills | Status: DC | PRN
  Filled 2023-08-24: qty 18, 25d supply, fill #0
  Filled 2023-09-07 – 2023-09-22 (×2): qty 18, 25d supply, fill #1
  Filled 2023-10-02 – 2023-10-21 (×2): qty 18, 25d supply, fill #2

## 2023-08-25 ENCOUNTER — Other Ambulatory Visit: Payer: Self-pay

## 2023-08-25 ENCOUNTER — Other Ambulatory Visit: Payer: Self-pay | Admitting: Physical Medicine and Rehabilitation

## 2023-08-25 ENCOUNTER — Other Ambulatory Visit (HOSPITAL_COMMUNITY): Payer: Self-pay

## 2023-08-26 ENCOUNTER — Other Ambulatory Visit: Payer: Self-pay | Admitting: Physical Medicine and Rehabilitation

## 2023-08-26 ENCOUNTER — Other Ambulatory Visit: Payer: Self-pay

## 2023-08-26 ENCOUNTER — Other Ambulatory Visit (HOSPITAL_COMMUNITY): Payer: Self-pay

## 2023-08-26 MED ORDER — MIDODRINE HCL 2.5 MG PO TABS
2.5000 mg | ORAL_TABLET | Freq: Three times a day (TID) | ORAL | 5 refills | Status: DC
  Filled 2023-08-26 (×2): qty 90, 30d supply, fill #0
  Filled 2023-09-07 – 2023-10-21 (×5): qty 90, 30d supply, fill #1

## 2023-08-27 ENCOUNTER — Other Ambulatory Visit: Payer: Self-pay

## 2023-08-27 ENCOUNTER — Other Ambulatory Visit (HOSPITAL_COMMUNITY): Payer: Self-pay

## 2023-08-27 MED ORDER — QUETIAPINE FUMARATE 50 MG PO TABS
50.0000 mg | ORAL_TABLET | Freq: Two times a day (BID) | ORAL | 5 refills | Status: DC | PRN
  Filled 2023-08-27 – 2023-09-07 (×2): qty 60, 30d supply, fill #0
  Filled 2023-09-22 – 2023-09-29 (×3): qty 60, 30d supply, fill #1
  Filled 2023-10-12 – 2023-10-21 (×2): qty 60, 30d supply, fill #2
  Filled 2023-11-08 – 2023-11-16 (×4): qty 60, 30d supply, fill #3
  Filled 2023-12-16 – 2023-12-21 (×2): qty 60, 30d supply, fill #4
  Filled 2023-12-23 – 2024-01-13 (×2): qty 60, 30d supply, fill #5

## 2023-08-27 MED FILL — Tizanidine HCl Tab 4 MG (Base Equivalent): ORAL | 90 days supply | Qty: 360 | Fill #0 | Status: AC

## 2023-08-31 ENCOUNTER — Other Ambulatory Visit (HOSPITAL_COMMUNITY): Payer: Self-pay

## 2023-09-07 ENCOUNTER — Other Ambulatory Visit: Payer: Self-pay | Admitting: Physical Medicine and Rehabilitation

## 2023-09-07 ENCOUNTER — Other Ambulatory Visit: Payer: Self-pay | Admitting: Family Medicine

## 2023-09-07 ENCOUNTER — Other Ambulatory Visit: Payer: Self-pay

## 2023-09-07 ENCOUNTER — Other Ambulatory Visit (HOSPITAL_COMMUNITY): Payer: Self-pay

## 2023-09-07 MED ORDER — LIDOCAINE 5 % EX PTCH
3.0000 | MEDICATED_PATCH | CUTANEOUS | 5 refills | Status: DC
  Filled 2023-09-07: qty 90, 30d supply, fill #0
  Filled 2023-10-01: qty 90, 30d supply, fill #1
  Filled 2023-11-01 – 2023-12-16 (×8): qty 90, 30d supply, fill #2
  Filled 2024-01-13 – 2024-02-01 (×3): qty 90, 30d supply, fill #3
  Filled 2024-03-07 – 2024-04-01 (×7): qty 90, 30d supply, fill #4
  Filled 2024-04-30 – 2024-05-03 (×3): qty 90, 30d supply, fill #5

## 2023-09-07 MED ORDER — TOPIRAMATE 50 MG PO TABS
150.0000 mg | ORAL_TABLET | Freq: Every day | ORAL | 1 refills | Status: DC
  Filled 2023-09-07: qty 270, 90d supply, fill #0
  Filled 2023-10-21: qty 90, 30d supply, fill #0
  Filled 2023-11-08: qty 270, 90d supply, fill #0
  Filled 2023-11-10: qty 90, 30d supply, fill #0
  Filled 2023-12-16 – 2023-12-21 (×2): qty 90, 30d supply, fill #1
  Filled 2024-01-07 – 2024-01-18 (×4): qty 90, 30d supply, fill #2
  Filled 2024-02-01 – 2024-02-22 (×3): qty 90, 30d supply, fill #3
  Filled 2024-03-19: qty 90, 30d supply, fill #4
  Filled 2024-03-21 – 2024-04-18 (×3): qty 90, 30d supply, fill #5

## 2023-09-07 MED FILL — Tizanidine HCl Tab 4 MG (Base Equivalent): ORAL | 90 days supply | Qty: 360 | Fill #1 | Status: CN

## 2023-09-08 ENCOUNTER — Other Ambulatory Visit: Payer: Self-pay

## 2023-09-08 ENCOUNTER — Encounter
Payer: Medicare Other | Attending: Physical Medicine and Rehabilitation | Admitting: Physical Medicine and Rehabilitation

## 2023-09-08 ENCOUNTER — Other Ambulatory Visit (HOSPITAL_COMMUNITY): Payer: Self-pay

## 2023-09-08 DIAGNOSIS — G4452 New daily persistent headache (NDPH): Secondary | ICD-10-CM | POA: Insufficient documentation

## 2023-09-08 DIAGNOSIS — G8250 Quadriplegia, unspecified: Secondary | ICD-10-CM | POA: Insufficient documentation

## 2023-09-08 DIAGNOSIS — G950 Syringomyelia and syringobulbia: Secondary | ICD-10-CM | POA: Insufficient documentation

## 2023-09-08 DIAGNOSIS — G8929 Other chronic pain: Secondary | ICD-10-CM | POA: Insufficient documentation

## 2023-09-08 DIAGNOSIS — M542 Cervicalgia: Secondary | ICD-10-CM | POA: Insufficient documentation

## 2023-09-08 DIAGNOSIS — R197 Diarrhea, unspecified: Secondary | ICD-10-CM | POA: Insufficient documentation

## 2023-09-08 DIAGNOSIS — K592 Neurogenic bowel, not elsewhere classified: Secondary | ICD-10-CM | POA: Insufficient documentation

## 2023-09-08 DIAGNOSIS — G904 Autonomic dysreflexia: Secondary | ICD-10-CM | POA: Insufficient documentation

## 2023-09-08 DIAGNOSIS — Z993 Dependence on wheelchair: Secondary | ICD-10-CM | POA: Insufficient documentation

## 2023-09-08 DIAGNOSIS — G894 Chronic pain syndrome: Secondary | ICD-10-CM | POA: Insufficient documentation

## 2023-09-10 ENCOUNTER — Other Ambulatory Visit (HOSPITAL_COMMUNITY): Payer: Self-pay

## 2023-09-13 ENCOUNTER — Telehealth: Payer: Self-pay | Admitting: Physical Medicine and Rehabilitation

## 2023-09-13 NOTE — Telephone Encounter (Signed)
 Patient called in and states she needs medication refill on methadone (DOLOPHINE) 5 MG tablet  and Hydrocodone , patient is scheduled 4/7 and states she will run out of medication before appointment . Patient would like them sent to [facility] , and she also would like a call back wants to see if she can get all her medication prescribed on the same date for her to be able to have access to all her medications

## 2023-09-14 ENCOUNTER — Other Ambulatory Visit (HOSPITAL_COMMUNITY): Payer: Self-pay

## 2023-09-14 NOTE — Telephone Encounter (Signed)
 Please call her on [phone]. She does have enough medication to last until the next appointment. However I think she is trying to state she does not have adequate coverage between refills.

## 2023-09-14 NOTE — Telephone Encounter (Signed)
 Have attempted to call pt- 8:30 pm x2 on 09/14/23

## 2023-09-18 ENCOUNTER — Other Ambulatory Visit (HOSPITAL_BASED_OUTPATIENT_CLINIC_OR_DEPARTMENT_OTHER): Payer: Self-pay

## 2023-09-18 ENCOUNTER — Other Ambulatory Visit (HOSPITAL_COMMUNITY): Payer: Self-pay

## 2023-09-20 ENCOUNTER — Other Ambulatory Visit: Payer: Self-pay

## 2023-09-21 ENCOUNTER — Other Ambulatory Visit (HOSPITAL_COMMUNITY): Payer: Self-pay

## 2023-09-22 ENCOUNTER — Other Ambulatory Visit: Payer: Self-pay

## 2023-09-22 ENCOUNTER — Other Ambulatory Visit (HOSPITAL_COMMUNITY): Payer: Self-pay

## 2023-09-23 ENCOUNTER — Other Ambulatory Visit: Payer: Self-pay

## 2023-09-27 ENCOUNTER — Other Ambulatory Visit: Payer: Self-pay

## 2023-09-27 ENCOUNTER — Telehealth: Payer: Self-pay

## 2023-09-27 ENCOUNTER — Encounter: Payer: Medicare Other | Admitting: Physical Medicine and Rehabilitation

## 2023-09-27 NOTE — Telephone Encounter (Signed)
 Per Dr. Berline Chough: Patient missed today's appointment. She will need to reschedule with Riley Lam in days. And then schedule an follow up with Dr. Berline Chough after that appointment.    Thank you.

## 2023-09-28 ENCOUNTER — Encounter: Attending: Physical Medicine and Rehabilitation | Admitting: Registered Nurse

## 2023-09-28 DIAGNOSIS — Z5181 Encounter for therapeutic drug level monitoring: Secondary | ICD-10-CM | POA: Insufficient documentation

## 2023-09-28 DIAGNOSIS — K592 Neurogenic bowel, not elsewhere classified: Secondary | ICD-10-CM | POA: Insufficient documentation

## 2023-09-28 DIAGNOSIS — Z79891 Long term (current) use of opiate analgesic: Secondary | ICD-10-CM | POA: Insufficient documentation

## 2023-09-28 DIAGNOSIS — Z993 Dependence on wheelchair: Secondary | ICD-10-CM | POA: Insufficient documentation

## 2023-09-28 DIAGNOSIS — G904 Autonomic dysreflexia: Secondary | ICD-10-CM | POA: Insufficient documentation

## 2023-09-28 DIAGNOSIS — I959 Hypotension, unspecified: Secondary | ICD-10-CM | POA: Insufficient documentation

## 2023-09-28 DIAGNOSIS — G894 Chronic pain syndrome: Secondary | ICD-10-CM | POA: Insufficient documentation

## 2023-09-28 DIAGNOSIS — G8250 Quadriplegia, unspecified: Secondary | ICD-10-CM | POA: Insufficient documentation

## 2023-09-29 ENCOUNTER — Other Ambulatory Visit: Payer: Self-pay

## 2023-09-29 ENCOUNTER — Other Ambulatory Visit (HOSPITAL_COMMUNITY): Payer: Self-pay

## 2023-10-01 ENCOUNTER — Other Ambulatory Visit: Payer: Self-pay

## 2023-10-02 ENCOUNTER — Other Ambulatory Visit: Payer: Self-pay | Admitting: Physical Medicine and Rehabilitation

## 2023-10-02 ENCOUNTER — Other Ambulatory Visit: Payer: Self-pay | Admitting: Family Medicine

## 2023-10-02 MED FILL — Tizanidine HCl Tab 4 MG (Base Equivalent): ORAL | 90 days supply | Qty: 360 | Fill #1 | Status: CN

## 2023-10-04 ENCOUNTER — Other Ambulatory Visit: Payer: Self-pay

## 2023-10-04 ENCOUNTER — Other Ambulatory Visit (HOSPITAL_COMMUNITY): Payer: Self-pay

## 2023-10-04 MED ORDER — CEPHALEXIN 500 MG PO CAPS
500.0000 mg | ORAL_CAPSULE | Freq: Three times a day (TID) | ORAL | 0 refills | Status: DC
  Filled 2023-10-04 – 2023-10-05 (×2): qty 21, 7d supply, fill #0

## 2023-10-05 ENCOUNTER — Other Ambulatory Visit (HOSPITAL_COMMUNITY): Payer: Self-pay

## 2023-10-05 ENCOUNTER — Other Ambulatory Visit: Payer: Self-pay

## 2023-10-05 MED ORDER — HYDROCODONE-ACETAMINOPHEN 10-325 MG PO TABS
1.0000 | ORAL_TABLET | Freq: Four times a day (QID) | ORAL | 0 refills | Status: DC | PRN
  Filled 2023-10-08: qty 120, 30d supply, fill #0

## 2023-10-05 MED ORDER — METHADONE HCL 5 MG PO TABS
5.0000 mg | ORAL_TABLET | Freq: Two times a day (BID) | ORAL | 0 refills | Status: DC
  Filled 2023-10-08: qty 60, 30d supply, fill #0

## 2023-10-05 MED ORDER — DIAZEPAM 5 MG PO TABS
5.0000 mg | ORAL_TABLET | Freq: Two times a day (BID) | ORAL | 0 refills | Status: DC | PRN
  Filled 2023-10-05 – 2023-10-08 (×2): qty 60, 30d supply, fill #0

## 2023-10-06 ENCOUNTER — Other Ambulatory Visit (HOSPITAL_COMMUNITY): Payer: Self-pay

## 2023-10-06 ENCOUNTER — Other Ambulatory Visit: Payer: Self-pay

## 2023-10-07 ENCOUNTER — Other Ambulatory Visit (HOSPITAL_COMMUNITY): Payer: Self-pay

## 2023-10-08 ENCOUNTER — Other Ambulatory Visit (HOSPITAL_COMMUNITY): Payer: Self-pay

## 2023-10-08 ENCOUNTER — Other Ambulatory Visit: Payer: Self-pay

## 2023-10-12 ENCOUNTER — Other Ambulatory Visit: Payer: Self-pay

## 2023-10-12 ENCOUNTER — Other Ambulatory Visit (HOSPITAL_COMMUNITY): Payer: Self-pay

## 2023-10-12 ENCOUNTER — Other Ambulatory Visit: Payer: Self-pay | Admitting: Physical Medicine and Rehabilitation

## 2023-10-12 ENCOUNTER — Encounter: Payer: Self-pay | Admitting: Registered Nurse

## 2023-10-12 ENCOUNTER — Encounter: Admitting: Registered Nurse

## 2023-10-12 VITALS — BP 90/61 | HR 81 | Temp 98.6°F | Ht 65.0 in

## 2023-10-12 DIAGNOSIS — I959 Hypotension, unspecified: Secondary | ICD-10-CM | POA: Diagnosis present

## 2023-10-12 DIAGNOSIS — G894 Chronic pain syndrome: Secondary | ICD-10-CM | POA: Diagnosis present

## 2023-10-12 DIAGNOSIS — G8250 Quadriplegia, unspecified: Secondary | ICD-10-CM

## 2023-10-12 DIAGNOSIS — K592 Neurogenic bowel, not elsewhere classified: Secondary | ICD-10-CM | POA: Diagnosis present

## 2023-10-12 DIAGNOSIS — G904 Autonomic dysreflexia: Secondary | ICD-10-CM

## 2023-10-12 DIAGNOSIS — Z79891 Long term (current) use of opiate analgesic: Secondary | ICD-10-CM

## 2023-10-12 DIAGNOSIS — Z5181 Encounter for therapeutic drug level monitoring: Secondary | ICD-10-CM | POA: Diagnosis present

## 2023-10-12 DIAGNOSIS — G950 Syringomyelia and syringobulbia: Secondary | ICD-10-CM

## 2023-10-12 DIAGNOSIS — Z993 Dependence on wheelchair: Secondary | ICD-10-CM | POA: Diagnosis present

## 2023-10-12 MED ORDER — DULOXETINE HCL 60 MG PO CPEP
60.0000 mg | ORAL_CAPSULE | Freq: Every day | ORAL | 5 refills | Status: DC
  Filled 2023-10-21: qty 30, 30d supply, fill #0
  Filled 2023-11-08 – 2023-11-16 (×5): qty 30, 30d supply, fill #1
  Filled 2023-12-16 – 2023-12-21 (×2): qty 30, 30d supply, fill #2
  Filled 2024-01-07 – 2024-01-18 (×4): qty 30, 30d supply, fill #3
  Filled 2024-02-01 – 2024-02-22 (×3): qty 30, 30d supply, fill #4
  Filled 2024-03-21 – 2024-03-28 (×3): qty 30, 30d supply, fill #5

## 2023-10-12 MED ORDER — GABAPENTIN 600 MG PO TABS
1200.0000 mg | ORAL_TABLET | Freq: Two times a day (BID) | ORAL | 5 refills | Status: DC
  Filled 2023-10-21: qty 120, 30d supply, fill #0
  Filled 2023-11-08 – 2023-11-16 (×5): qty 120, 30d supply, fill #1
  Filled 2023-12-16 – 2023-12-21 (×2): qty 120, 30d supply, fill #2
  Filled 2024-01-07 – 2024-01-18 (×4): qty 120, 30d supply, fill #3
  Filled 2024-02-01 – 2024-02-22 (×3): qty 120, 30d supply, fill #4
  Filled 2024-03-21 – 2024-03-28 (×3): qty 120, 30d supply, fill #5
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
## 2023-10-16 LAB — DRUG TOX MONITOR 1 W/CONF, ORAL FLD
Alprazolam: NEGATIVE ng/mL (ref <0.50)
Aminoclonazepam: NEGATIVE ng/mL (ref <0.50)
Amphetamines: NEGATIVE ng/mL (ref <10)
Barbiturates: NEGATIVE ng/mL (ref <10)
Benzodiazepines: POSITIVE ng/mL — AB (ref <0.50)
Buprenorphine: NEGATIVE ng/mL (ref <0.10)
Chlordiazepoxide: NEGATIVE ng/mL (ref <0.50)
Clonazepam: NEGATIVE ng/mL (ref <0.50)
Cocaine: NEGATIVE ng/mL (ref <5.0)
Codeine: NEGATIVE ng/mL (ref <2.5)
Diazepam: NEGATIVE ng/mL (ref <0.50)
Dihydrocodeine: 7.2 ng/mL — ABNORMAL HIGH (ref <2.5)
EDDP: NEGATIVE ng/mL (ref <5.0)
Fentanyl: NEGATIVE ng/mL (ref <0.10)
Flunitrazepam: NEGATIVE ng/mL (ref <0.50)
Flurazepam: NEGATIVE ng/mL (ref <0.50)
Heroin Metabolite: NEGATIVE ng/mL (ref <1.0)
Hydrocodone: 68.8 ng/mL — ABNORMAL HIGH (ref <2.5)
Hydromorphone: NEGATIVE ng/mL (ref <2.5)
Lorazepam: NEGATIVE ng/mL (ref <0.50)
MARIJUANA: NEGATIVE ng/mL (ref <2.5)
MDMA: NEGATIVE ng/mL (ref <10)
Meprobamate: NEGATIVE ng/mL (ref <2.5)
Methadone: 28.5 ng/mL — ABNORMAL HIGH (ref <5.0)
Methadone: POSITIVE ng/mL — AB (ref <5.0)
Midazolam: NEGATIVE ng/mL (ref <0.50)
Morphine: NEGATIVE ng/mL (ref <2.5)
Nicotine Metabolite: NEGATIVE ng/mL (ref <5.0)
Nordiazepam: 2.02 ng/mL — ABNORMAL HIGH (ref <0.50)
Norhydrocodone: 11.4 ng/mL — ABNORMAL HIGH (ref <2.5)
Noroxycodone: NEGATIVE ng/mL (ref <2.5)
Opiates: POSITIVE ng/mL — AB (ref <2.5)
Oxazepam: NEGATIVE ng/mL (ref <0.50)
Oxycodone: NEGATIVE ng/mL (ref <2.5)
Oxymorphone: NEGATIVE ng/mL (ref <2.5)
Phencyclidine: NEGATIVE ng/mL (ref <10)
THC: NEGATIVE ng/mL (ref <2.5)
Tapentadol: NEGATIVE ng/mL (ref <5.0)
Temazepam: NEGATIVE ng/mL (ref <0.50)
Tramadol: NEGATIVE ng/mL (ref <5.0)
Triazolam: NEGATIVE ng/mL (ref <0.50)
Zolpidem: NEGATIVE ng/mL (ref <5.0)

## 2023-10-16 LAB — DRUG TOX ALC METAB W/CON, ORAL FLD: Alcohol Metabolite: NEGATIVE ng/mL (ref <25)

## 2023-10-20 ENCOUNTER — Other Ambulatory Visit: Payer: Self-pay

## 2023-10-21 ENCOUNTER — Other Ambulatory Visit: Payer: Self-pay

## 2023-10-21 ENCOUNTER — Other Ambulatory Visit (HOSPITAL_COMMUNITY): Payer: Self-pay

## 2023-10-21 ENCOUNTER — Other Ambulatory Visit: Payer: Self-pay | Admitting: Physical Medicine and Rehabilitation

## 2023-10-21 ENCOUNTER — Telehealth: Admitting: Physician Assistant

## 2023-10-21 ENCOUNTER — Other Ambulatory Visit: Payer: Self-pay | Admitting: Family Medicine

## 2023-10-21 MED ORDER — CEPHALEXIN 500 MG PO CAPS
500.0000 mg | ORAL_CAPSULE | Freq: Three times a day (TID) | ORAL | 0 refills | Status: DC
  Filled 2023-10-21 – 2023-10-22 (×2): qty 21, 7d supply, fill #0

## 2023-10-21 MED FILL — Tizanidine HCl Tab 4 MG (Base Equivalent): ORAL | 30 days supply | Qty: 120 | Fill #1 | Status: CN

## 2023-10-21 NOTE — Progress Notes (Signed)
 The patient no-showed for appointment despite this provider sending direct link, reaching out via phone with no response and waiting for at least 10 minutes from appointment time for patient to join. They will be marked as a NS for this appointment/time.  ? ?[name], PA-C ? ? ? ?

## 2023-10-22 ENCOUNTER — Other Ambulatory Visit (HOSPITAL_COMMUNITY): Payer: Self-pay

## 2023-10-22 ENCOUNTER — Other Ambulatory Visit: Payer: Self-pay

## 2023-10-22 ENCOUNTER — Telehealth: Admitting: Family Medicine

## 2023-10-22 MED ORDER — LIDOCAINE 5 % EX PTCH
MEDICATED_PATCH | CUTANEOUS | 5 refills | Status: DC
  Filled 2023-10-22 (×2): qty 90, 90d supply, fill #0
  Filled 2023-10-23: qty 90, 30d supply, fill #0
  Filled 2023-11-08 – 2023-11-15 (×2): qty 90, 30d supply, fill #1
  Filled 2023-12-16 – 2024-01-08 (×6): qty 90, 30d supply, fill #2
  Filled 2024-01-12 – 2024-03-07 (×9): qty 90, 30d supply, fill #3

## 2023-10-22 NOTE — Progress Notes (Signed)
 Pt did not show. I tried the multiple numbers on the chart and email. Second no show today- DWB

## 2023-10-23 ENCOUNTER — Other Ambulatory Visit (HOSPITAL_COMMUNITY): Payer: Self-pay

## 2023-10-23 ENCOUNTER — Other Ambulatory Visit (HOSPITAL_BASED_OUTPATIENT_CLINIC_OR_DEPARTMENT_OTHER): Payer: Self-pay

## 2023-10-25 ENCOUNTER — Other Ambulatory Visit: Payer: Self-pay

## 2023-10-25 ENCOUNTER — Other Ambulatory Visit (HOSPITAL_COMMUNITY): Payer: Self-pay

## 2023-10-25 MED ORDER — DIAZEPAM 5 MG PO TABS
5.0000 mg | ORAL_TABLET | Freq: Two times a day (BID) | ORAL | 0 refills | Status: DC | PRN
  Filled 2023-10-25 – 2023-11-10 (×3): qty 60, 30d supply, fill #0

## 2023-10-25 MED ORDER — HYDROCODONE-ACETAMINOPHEN 10-325 MG PO TABS
1.0000 | ORAL_TABLET | Freq: Four times a day (QID) | ORAL | 0 refills | Status: DC | PRN
  Filled 2023-10-25 – 2023-11-10 (×3): qty 120, 30d supply, fill #0

## 2023-10-25 MED ORDER — METHADONE HCL 5 MG PO TABS
5.0000 mg | ORAL_TABLET | Freq: Two times a day (BID) | ORAL | 0 refills | Status: DC
  Filled 2023-10-25 – 2023-11-10 (×3): qty 60, 30d supply, fill #0

## 2023-10-26 ENCOUNTER — Other Ambulatory Visit: Payer: Self-pay

## 2023-10-26 ENCOUNTER — Telehealth: Payer: Self-pay | Admitting: Physical Medicine and Rehabilitation

## 2023-10-26 ENCOUNTER — Other Ambulatory Visit (HOSPITAL_COMMUNITY): Payer: Self-pay

## 2023-10-26 DIAGNOSIS — G8250 Quadriplegia, unspecified: Secondary | ICD-10-CM

## 2023-10-26 DIAGNOSIS — I951 Orthostatic hypotension: Secondary | ICD-10-CM

## 2023-10-26 NOTE — Telephone Encounter (Signed)
 Patient in so much pain and medication isn't helping her.  Would like for Dr. Lovorn to call her.

## 2023-10-27 ENCOUNTER — Other Ambulatory Visit: Payer: Self-pay

## 2023-10-27 NOTE — Telephone Encounter (Signed)
 She is worrying about falling off her bed at this point. She states she passing out at least 4 a day. Have not got a call from a Neurologist yet. Please advise.

## 2023-10-27 NOTE — Telephone Encounter (Signed)
 Pt is asking for call back- called and didn't get through, which is frequent- pt's husband mentioned she doesn't answer the phone at last appointment I saw her- left message on voicemail- that need to see patient before I'm titrating meds- also has missed or cancelled 4 appointments since February-  and only was seen by Emilia Harbour 10/12/23 after trying to get in touch with her multiple times.   My concern is that patient continues to want me to increase pain meds, however she continues to miss appointments and not following the clinic policies.   Unfortunately, don't feel comfortable titrating meds until patient is able to make it to appointments per clinic policy, esp over the phone, however I will discuss further with her at appointment 11/12/23 at 3:20 -reminded her of appt on voicemail as well

## 2023-10-27 NOTE — Telephone Encounter (Signed)
 Patient called back and states that she is trying to manage her BP but it keeps bottoming out and she is passing out. She is asking for advice on what to do.

## 2023-10-28 ENCOUNTER — Other Ambulatory Visit: Payer: Self-pay

## 2023-10-28 ENCOUNTER — Other Ambulatory Visit (HOSPITAL_COMMUNITY): Payer: Self-pay

## 2023-10-28 ENCOUNTER — Encounter: Payer: Self-pay | Admitting: *Deleted

## 2023-10-28 MED ORDER — MIDODRINE HCL 2.5 MG PO TABS
5.0000 mg | ORAL_TABLET | Freq: Three times a day (TID) | ORAL | 5 refills | Status: DC
  Filled 2023-10-28 (×2): qty 90, 15d supply, fill #0
  Filled 2023-11-08 – 2023-11-10 (×2): qty 90, 15d supply, fill #1
  Filled 2023-11-12: qty 180, 30d supply, fill #1

## 2023-10-28 MED ORDER — FLUDROCORTISONE ACETATE 0.1 MG PO TABS
0.2000 mg | ORAL_TABLET | Freq: Every day | ORAL | 5 refills | Status: DC
  Filled 2023-10-28 (×2): qty 30, 15d supply, fill #0
  Filled 2023-11-05 – 2023-11-10 (×3): qty 30, 15d supply, fill #1
  Filled 2023-11-12 – 2023-11-16 (×2): qty 60, 30d supply, fill #1
  Filled 2023-12-16 – 2023-12-21 (×2): qty 60, 30d supply, fill #2
  Filled 2024-01-12 – 2024-01-13 (×3): qty 30, 15d supply, fill #3

## 2023-10-28 NOTE — Addendum Note (Signed)
 Addended by: Darron Stuck on: 10/28/2023 11:01 AM   Modules accepted: Orders

## 2023-10-28 NOTE — Telephone Encounter (Signed)
 We can refill her Midodrine  and Florinef - which is appears she hasn't been taking regularly- and can increase some Will also place consult for Cards to see her, but she has to answer phone ot get appointment- if she doesn't want to do this, she needs to go to ED.   I cannot increase her Midodrine  without speaking with Cards, but she' hasn't refilled regularly either of her BP meds-  Stop taking Zanaflex /Tizanidine  since that can lower BP. I did increase her Florinef  as well for low BP.   I cannot send her meds to pharmacy without knowing where she wants them sent- [facility] or [facility]

## 2023-11-01 ENCOUNTER — Other Ambulatory Visit (HOSPITAL_COMMUNITY): Payer: Self-pay

## 2023-11-01 ENCOUNTER — Other Ambulatory Visit: Payer: Self-pay

## 2023-11-02 ENCOUNTER — Other Ambulatory Visit (HOSPITAL_COMMUNITY): Payer: Self-pay

## 2023-11-03 ENCOUNTER — Other Ambulatory Visit (HOSPITAL_COMMUNITY): Payer: Self-pay

## 2023-11-04 ENCOUNTER — Other Ambulatory Visit: Payer: Self-pay

## 2023-11-05 ENCOUNTER — Other Ambulatory Visit: Payer: Self-pay

## 2023-11-08 ENCOUNTER — Other Ambulatory Visit: Payer: Self-pay | Admitting: Family Medicine

## 2023-11-08 MED FILL — Tizanidine HCl Tab 4 MG (Base Equivalent): ORAL | 90 days supply | Qty: 360 | Fill #1 | Status: CN

## 2023-11-09 ENCOUNTER — Other Ambulatory Visit: Payer: Self-pay

## 2023-11-09 ENCOUNTER — Other Ambulatory Visit (HOSPITAL_COMMUNITY): Payer: Self-pay

## 2023-11-09 MED ORDER — ALBUTEROL SULFATE HFA 108 (90 BASE) MCG/ACT IN AERS
2.0000 | INHALATION_SPRAY | Freq: Four times a day (QID) | RESPIRATORY_TRACT | 2 refills | Status: DC | PRN
  Filled 2023-11-09: qty 18, 25d supply, fill #0
  Filled 2023-12-16: qty 18, 25d supply, fill #1
  Filled 2024-01-12: qty 18, 25d supply, fill #2

## 2023-11-10 ENCOUNTER — Other Ambulatory Visit: Payer: Self-pay

## 2023-11-10 ENCOUNTER — Telehealth: Payer: Self-pay | Admitting: Physical Medicine and Rehabilitation

## 2023-11-10 ENCOUNTER — Other Ambulatory Visit (HOSPITAL_COMMUNITY): Payer: Self-pay

## 2023-11-10 MED FILL — Tizanidine HCl Tab 4 MG (Base Equivalent): ORAL | 90 days supply | Qty: 360 | Fill #1 | Status: CN

## 2023-11-10 NOTE — Telephone Encounter (Signed)
 Patient called crying and could hardly understand her.  I did get that her father passed away and I told her I would have Dr. Raynaldo Call call her.

## 2023-11-11 ENCOUNTER — Other Ambulatory Visit (HOSPITAL_COMMUNITY): Payer: Self-pay

## 2023-11-12 ENCOUNTER — Encounter
Payer: Medicare Other | Attending: Physical Medicine and Rehabilitation | Admitting: Physical Medicine and Rehabilitation

## 2023-11-12 ENCOUNTER — Other Ambulatory Visit (HOSPITAL_COMMUNITY): Payer: Self-pay

## 2023-11-12 ENCOUNTER — Encounter: Payer: Self-pay | Admitting: Physical Medicine and Rehabilitation

## 2023-11-12 ENCOUNTER — Telehealth: Payer: Self-pay

## 2023-11-12 ENCOUNTER — Other Ambulatory Visit: Payer: Self-pay

## 2023-11-12 VITALS — BP 65/42 | HR 81

## 2023-11-12 DIAGNOSIS — G8250 Quadriplegia, unspecified: Secondary | ICD-10-CM | POA: Insufficient documentation

## 2023-11-12 DIAGNOSIS — G4452 New daily persistent headache (NDPH): Secondary | ICD-10-CM | POA: Insufficient documentation

## 2023-11-12 DIAGNOSIS — Z993 Dependence on wheelchair: Secondary | ICD-10-CM | POA: Insufficient documentation

## 2023-11-12 DIAGNOSIS — Z978 Presence of other specified devices: Secondary | ICD-10-CM | POA: Diagnosis present

## 2023-11-12 DIAGNOSIS — N390 Urinary tract infection, site not specified: Secondary | ICD-10-CM | POA: Diagnosis present

## 2023-11-12 DIAGNOSIS — G894 Chronic pain syndrome: Secondary | ICD-10-CM | POA: Insufficient documentation

## 2023-11-12 DIAGNOSIS — R208 Other disturbances of skin sensation: Secondary | ICD-10-CM | POA: Diagnosis present

## 2023-11-12 MED ORDER — METHADONE HCL 5 MG PO TABS
5.0000 mg | ORAL_TABLET | Freq: Two times a day (BID) | ORAL | 0 refills | Status: DC
  Filled 2023-11-12 – 2023-12-14 (×2): qty 60, 30d supply, fill #0

## 2023-11-12 MED ORDER — MIDODRINE HCL 10 MG PO TABS
10.0000 mg | ORAL_TABLET | Freq: Three times a day (TID) | ORAL | 5 refills | Status: DC
  Filled 2023-11-12 – 2023-11-16 (×3): qty 90, 30d supply, fill #0
  Filled 2023-11-19 – 2023-12-21 (×3): qty 90, 30d supply, fill #1
  Filled 2024-01-07 – 2024-01-18 (×4): qty 90, 30d supply, fill #2
  Filled 2024-02-01 – 2024-02-22 (×3): qty 90, 30d supply, fill #3
  Filled 2024-03-21 – 2024-03-28 (×3): qty 90, 30d supply, fill #4
  Filled 2024-04-20: qty 90, 30d supply, fill #5

## 2023-11-12 MED ORDER — HYDROCODONE-ACETAMINOPHEN 10-325 MG PO TABS
1.0000 | ORAL_TABLET | Freq: Four times a day (QID) | ORAL | 0 refills | Status: DC | PRN
  Filled 2023-11-12 – 2023-12-14 (×2): qty 120, 30d supply, fill #0

## 2023-11-12 MED FILL — Tizanidine HCl Tab 4 MG (Base Equivalent): ORAL | 90 days supply | Qty: 360 | Fill #1 | Status: CN
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
## 2023-11-12 NOTE — Telephone Encounter (Signed)
 Unfortunately I did not see this note until after 430.  I called and left Dr. Devore in a message and gave her my cell phone.

## 2023-11-12 NOTE — Telephone Encounter (Signed)
 Dr. Megan Lovorn calls nurse line requesting to speak with PCP.   She reports she spoke with patient this morning who expressed passive SI and depression. She reports she was crying uncontrollably over the phone. She reports she has an apt with her this afternoon, however she does not know how to help her in this area. She reports she would like to brain storm with Dr. Andree Kayser.   I called patient to check on her. She reports she is depressed and down due to chronic conditions. She denies any active plan of SI, however states, "I couldn't kill myself if I wanted to, I'm stuck in a wheelchair." She reports she is just tired and irritable and she is pushing her family away.   She reports she is not going to the emergency behavioral health center, however reports she would be willing to call the hotline number. She reports she has it.   Patient scheduled with PCP for a virtual visit to discuss depression on 6/18.  Options discussed with patient in the meantime for mental health crisis.   Will forward to PCP.   Dr. Lovorn [phone]

## 2023-11-12 NOTE — Telephone Encounter (Addendum)
 Pt is overwhelmed with pain and depression- she is having trouble taking her meds due to "razor blades" in her mouth- and having "fevers nightly"- she's also having pain not controlled with current meds.  She was sobbing the entire time on phone- reached out to Dr Andree Kayser-  Will see if we have any ideas- pt refuses ED visit.  See today at 3:20, but I'm concerned she has things I cannot deal with as the SCI physician. Will see how we can help

## 2023-11-13 ENCOUNTER — Other Ambulatory Visit (HOSPITAL_COMMUNITY): Payer: Self-pay

## 2023-11-14 ENCOUNTER — Other Ambulatory Visit: Payer: Self-pay

## 2023-11-16 ENCOUNTER — Other Ambulatory Visit: Payer: Self-pay | Admitting: Physical Medicine and Rehabilitation

## 2023-11-16 ENCOUNTER — Other Ambulatory Visit (HOSPITAL_COMMUNITY): Payer: Self-pay

## 2023-11-16 ENCOUNTER — Other Ambulatory Visit: Payer: Self-pay

## 2023-11-16 ENCOUNTER — Telehealth: Payer: Self-pay | Admitting: Physical Medicine and Rehabilitation

## 2023-11-16 ENCOUNTER — Telehealth: Payer: Self-pay

## 2023-11-16 MED ORDER — TIZANIDINE HCL 4 MG PO TABS
4.0000 mg | ORAL_TABLET | Freq: Four times a day (QID) | ORAL | 1 refills | Status: DC
  Filled 2023-11-16: qty 120, 30d supply, fill #0
  Filled 2023-12-15 (×2): qty 120, 30d supply, fill #1
  Filled 2024-01-07 – 2024-01-18 (×4): qty 120, 30d supply, fill #2
  Filled 2024-02-01 – 2024-02-22 (×3): qty 120, 30d supply, fill #3
  Filled 2024-03-21 – 2024-03-28 (×3): qty 120, 30d supply, fill #4
  Filled 2024-04-20: qty 120, 30d supply, fill #5

## 2023-11-16 NOTE — Telephone Encounter (Signed)
 Patient calls nurse line in regards to Catheters.   She reports she needs one ASAP. She reports her insurance only covers 1 per month, however her current one is leaking.   I spoke with Porfirio Bristol, and he reports we do not have any Catheter supplies here to give her.   Advised a DME store in town or possibly [place] for urgent need.  It appears she has asked Physical Medicine provider for a DME order.   She was appreciative.

## 2023-11-16 NOTE — Telephone Encounter (Signed)
 She called back again and stated she needs one of each?

## 2023-11-16 NOTE — Telephone Encounter (Signed)
 Patient called in and states she urgently needs assistance with her catheter , due to insurance she only gets 1 a month and needs one asap , she states over the weekend the one she has started leaking and is willing to pay out of pocket but needs an rx , she would like for us  to email it to her she has been trying to call different places , or she is okay for us  to fax it to [facility] fax [phone] and is asking if it could please be done today

## 2023-11-17 ENCOUNTER — Other Ambulatory Visit: Payer: Self-pay

## 2023-11-18 ENCOUNTER — Other Ambulatory Visit: Payer: Self-pay

## 2023-11-19 ENCOUNTER — Other Ambulatory Visit: Payer: Self-pay | Admitting: Physical Medicine and Rehabilitation

## 2023-11-19 ENCOUNTER — Other Ambulatory Visit: Payer: Self-pay

## 2023-11-20 ENCOUNTER — Other Ambulatory Visit (HOSPITAL_COMMUNITY): Payer: Self-pay

## 2023-11-22 ENCOUNTER — Other Ambulatory Visit (HOSPITAL_COMMUNITY): Payer: Self-pay

## 2023-11-22 ENCOUNTER — Telehealth: Payer: Self-pay | Admitting: Physical Medicine and Rehabilitation

## 2023-11-22 DIAGNOSIS — N319 Neuromuscular dysfunction of bladder, unspecified: Secondary | ICD-10-CM

## 2023-11-22 DIAGNOSIS — N390 Urinary tract infection, site not specified: Secondary | ICD-10-CM

## 2023-11-22 DIAGNOSIS — G8250 Quadriplegia, unspecified: Secondary | ICD-10-CM
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
## 2023-11-25 ENCOUNTER — Other Ambulatory Visit: Payer: Self-pay

## 2023-11-25 ENCOUNTER — Other Ambulatory Visit (HOSPITAL_COMMUNITY): Payer: Self-pay

## 2023-11-25 MED ORDER — SUMATRIPTAN SUCCINATE 100 MG PO TABS
ORAL_TABLET | ORAL | 5 refills | Status: DC
  Filled 2023-11-25: qty 18, 28d supply, fill #0
  Filled 2023-11-26: qty 18, 14d supply, fill #0
  Filled 2023-12-02 (×2): qty 18, 30d supply, fill #0
  Filled 2023-12-23: qty 18, 30d supply, fill #1
  Filled 2024-01-07 – 2024-01-18 (×4): qty 18, 30d supply, fill #2
  Filled 2024-01-21 – 2024-02-22 (×4): qty 18, 30d supply, fill #3
  Filled 2024-03-21: qty 18, 30d supply, fill #4
  Filled 2024-03-30 – 2024-04-30 (×4): qty 18, 30d supply, fill #5

## 2023-11-25 MED ORDER — DIAZEPAM 5 MG PO TABS
5.0000 mg | ORAL_TABLET | Freq: Two times a day (BID) | ORAL | 2 refills | Status: DC | PRN
  Filled 2023-11-25 – 2023-12-09 (×2): qty 60, 30d supply, fill #0
  Filled 2023-12-23 – 2024-02-25 (×3): qty 60, 30d supply, fill #1
  Filled 2024-03-21 – 2024-03-27 (×3): qty 60, 30d supply, fill #2

## 2023-11-26 ENCOUNTER — Other Ambulatory Visit (HOSPITAL_BASED_OUTPATIENT_CLINIC_OR_DEPARTMENT_OTHER): Payer: Self-pay

## 2023-11-26 ENCOUNTER — Other Ambulatory Visit: Payer: Self-pay

## 2023-11-29 NOTE — Telephone Encounter (Signed)
 Doesn't remember where she is, why she's so confused when "passes out"- at home- is either Her BP is too low- and is passing out, but if passing out and is disoriented- could be seizures.   Eczema in eyebrows and eyelashes Wondering if can get in Derm  Neuro appt so far out.   Low grade fever last 2-3 days- cannot break it. Actually for weeks-almost  up to 101 -100.9 yesterday-  Was taking tylenol  pm-  We discussed alternate ibuprofen  and tylenol .  Every 3 hours take ibuprofen  and tylenol .  Still having problems after Keflex - got 10/21/23-  Having fever every day- PCP just sent in Keflex   Has just been sending in Keflex  without U/A and Cx-  Will order Urinalysis- computer won't let me order Urine Culture- tried to put in computer- buit could only enter Urinalysis- put it in order  Very concerned about wanting to change her regimen "all around". Which I'm happy to do. We will do when get back from vacation

## 2023-12-01 ENCOUNTER — Other Ambulatory Visit: Payer: Self-pay

## 2023-12-02 ENCOUNTER — Other Ambulatory Visit (HOSPITAL_COMMUNITY): Payer: Self-pay

## 2023-12-02 ENCOUNTER — Other Ambulatory Visit: Payer: Self-pay

## 2023-12-06 ENCOUNTER — Telehealth: Payer: Self-pay | Admitting: Registered Nurse

## 2023-12-06 ENCOUNTER — Telehealth: Payer: Self-pay

## 2023-12-06 ENCOUNTER — Telehealth: Payer: Self-pay | Admitting: Physical Medicine and Rehabilitation

## 2023-12-06 DIAGNOSIS — R21 Rash and other nonspecific skin eruption: Secondary | ICD-10-CM

## 2023-12-06 NOTE — Telephone Encounter (Signed)
 P called in and is requesting a referrla

## 2023-12-06 NOTE — Telephone Encounter (Signed)
 Patient LVM on nurse line requesting that Dr. Andree Kayser send referral to [facility] for Dermatologist.   Returned call to patient to determine need of dermatology referral.   She reports that she has scales around her hairline and has been experiencing hair loss and itchiness of scalp.   She was told by [facility] that if she received referral that they could see her as soon as tomorrow.   Will forward to PCP for advisement.   Elsie Halo, RN

## 2023-12-06 NOTE — Telephone Encounter (Signed)
 Patient would like for our office to send referral for dermatology [facility] @ [phone].  Their phone number is [phone].

## 2023-12-06 NOTE — Telephone Encounter (Signed)
 Called patient and LVM advising that referral has been sent to [facility].   Elsie Halo, RN

## 2023-12-08 ENCOUNTER — Telehealth: Admitting: Family Medicine

## 2023-12-08 ENCOUNTER — Other Ambulatory Visit (HOSPITAL_COMMUNITY): Payer: Self-pay

## 2023-12-08 ENCOUNTER — Other Ambulatory Visit: Payer: Self-pay

## 2023-12-08 DIAGNOSIS — F32A Depression, unspecified: Secondary | ICD-10-CM

## 2023-12-08 DIAGNOSIS — L209 Atopic dermatitis, unspecified: Secondary | ICD-10-CM

## 2023-12-08 DIAGNOSIS — F333 Major depressive disorder, recurrent, severe with psychotic symptoms: Secondary | ICD-10-CM

## 2023-12-08 DIAGNOSIS — R21 Rash and other nonspecific skin eruption: Secondary | ICD-10-CM

## 2023-12-08 DIAGNOSIS — G8250 Quadriplegia, unspecified: Secondary | ICD-10-CM

## 2023-12-08 MED ORDER — LISDEXAMFETAMINE DIMESYLATE 20 MG PO CAPS
20.0000 mg | ORAL_CAPSULE | Freq: Every morning | ORAL | 0 refills | Status: DC
  Filled 2023-12-08 – 2023-12-17 (×4): qty 30, 30d supply, fill #0

## 2023-12-08 NOTE — Progress Notes (Signed)
 [facility] Telemedicine Visit  Patient consented to have virtual visit and was identified by name and date of birth. Method of visit: Video  Encounter participants: Patient: Tracey Hall - located at home Provider: Violetta Grice - located at family medicine center Others (if applicable): None  Chief Complaint: Depression, chronic pain worsening, lack of motivation  HPI:  Tearful discussion about her total lack of motivation.  Says she is not even enjoying visits by her grandchildren.  Feels miserable.  Has not really been out of her bedroom for the last 3 weeks.  Not even in her wheelchair. 2.  Skin rash: It is itchy and flaky and scaling and darkening the areas of her skin.  Goes up into her scalp.  Really driving her crazy.  Also interfering with body image.  ROS: per HPI  Pertinent PMHx: Quadriparesis  Exam:  There were no vitals taken for this visit.  Respiratory: Normal PSYCH: Alert and oriented x 4.  Tearful.  Asks and answers questions appropriately.  Denies hallucination.  Denies suicidal or homicidal ideation intent or plan.  Speech is normal in content and fluidity. SKIN: Atopic type changes on the forehead and the scalp has some flaking areas in it.  She showed me these and is going to also send me some additional photographs she has taken. Assessment/Plan: #1.  Depression: She is on a lot of medications for her quadriparesis so it makes it difficult to add on.  We may end up needing to have psychiatry consult for medication management but she does not really want to do that right now.  When I asked her about CBT, she said she had had thousands of therapist.  She is in a very unusual situation.  We spent 35 minutes on the video call together discussing options.  Ultimately we decided to try an addition of Vyvanse 20 mg daily.  He does not have QT prolongation as much as some of the other medications in that class.  I will speak with her next Tuesday at  2 PM for follow-up.  She will use MyChart or call in the interim if she has issues. 2.  I will await the photos but I think what we are dealing with is severe atopic dermatitis.  There may be some seborrhea in the scalp area.  The plan would be to do some topical medium to high-dose steroids for a week or 2 to get it under control adding Selsun Blue shampoo to the hair twice a week.  I will discuss this with her when not see her on Tuesday.  She is amenable to this plan.     Time spent during visit with patient: Minutes 35

## 2023-12-09 ENCOUNTER — Other Ambulatory Visit: Payer: Self-pay

## 2023-12-09 ENCOUNTER — Other Ambulatory Visit (HOSPITAL_COMMUNITY): Payer: Self-pay

## 2023-12-13 ENCOUNTER — Encounter: Payer: Self-pay | Admitting: Family Medicine

## 2023-12-14 ENCOUNTER — Other Ambulatory Visit (HOSPITAL_COMMUNITY): Payer: Self-pay

## 2023-12-14 ENCOUNTER — Telehealth: Payer: Self-pay

## 2023-12-14 ENCOUNTER — Telehealth: Admitting: Family Medicine

## 2023-12-14 ENCOUNTER — Telehealth

## 2023-12-14 ENCOUNTER — Other Ambulatory Visit: Payer: Self-pay | Admitting: Family Medicine

## 2023-12-14 ENCOUNTER — Other Ambulatory Visit: Payer: Self-pay

## 2023-12-14 DIAGNOSIS — F19939 Other psychoactive substance use, unspecified with withdrawal, unspecified: Secondary | ICD-10-CM | POA: Diagnosis present

## 2023-12-14 MED ORDER — CRISABOROLE 2 % EX OINT
TOPICAL_OINTMENT | CUTANEOUS | 3 refills | Status: DC
  Filled 2023-12-14 – 2024-01-06 (×8): qty 60, 30d supply, fill #0
  Filled 2024-01-07 – 2024-02-01 (×5): qty 60, 30d supply, fill #1
  Filled 2024-02-25: qty 60, 30d supply, fill #2
  Filled 2024-03-21: qty 60, 30d supply, fill #3

## 2023-12-14 NOTE — Telephone Encounter (Signed)
 Patient calls nurse line regarding issues with picking up Vyvanse .   Called [facility]. I was advised that medication needs prior authorization.   Will forward to [place] for further assistance with PA.   Chiquita JAYSON English, RN

## 2023-12-15 ENCOUNTER — Other Ambulatory Visit (HOSPITAL_COMMUNITY): Payer: Self-pay

## 2023-12-15 ENCOUNTER — Other Ambulatory Visit: Payer: Self-pay

## 2023-12-15 NOTE — Progress Notes (Signed)
 [facility] Telemedicine Visit  Patient consented to have virtual visit and was identified by name and date of birth. Method of visit: Telephone  Encounter participants: Patient: Tracey Hall - located at home Provider: Camie Mulch - located at office Others (if applicable): Not applicable  Chief Complaint: Out of her pain pills, out of her methadone , going through withdrawal  HPI:  Some mixup with the pharmacy not being able to do it deliver her medication so she has been out of her methadone  all weekend.  Her significant others were trying to pick it up this evening.  She also did not pick up the medication I had 1 to try for her added on for depressive symptoms.  ROS: per HPI  Pertinent PMHx: Quadriparesis  Exam:  There were no vitals taken for this visit.  Respiratory: Normal respiratory effort  Assessment/Plan: Opioid withdrawal most likely.  Discussed whether she should go to the hospital or not she adamantly refuses.  She has worked with me to formulate a plan to get her medication this afternoon.  I will touch base with her in 1 week. No problem-specific Assessment & Plan notes found for this encounter.    Time spent during visit with patient: 15 minutes

## 2023-12-16 ENCOUNTER — Other Ambulatory Visit: Payer: Self-pay | Admitting: Family Medicine

## 2023-12-16 ENCOUNTER — Other Ambulatory Visit: Payer: Self-pay

## 2023-12-16 ENCOUNTER — Other Ambulatory Visit (HOSPITAL_COMMUNITY): Payer: Self-pay

## 2023-12-16 NOTE — Telephone Encounter (Signed)
 PA submitted via Covermymeds.   Will await response.  Chiquita JAYSON English, RN

## 2023-12-17 ENCOUNTER — Other Ambulatory Visit (HOSPITAL_COMMUNITY): Payer: Self-pay

## 2023-12-17 ENCOUNTER — Other Ambulatory Visit: Payer: Self-pay

## 2023-12-17 ENCOUNTER — Telehealth: Payer: Self-pay | Admitting: Physical Medicine and Rehabilitation

## 2023-12-17 MED ORDER — CEPHALEXIN 500 MG PO CAPS
500.0000 mg | ORAL_CAPSULE | Freq: Three times a day (TID) | ORAL | 0 refills | Status: DC
  Filled 2023-12-17 (×2): qty 21, 7d supply, fill #0

## 2023-12-17 NOTE — Telephone Encounter (Signed)
 PA Denied. See below.   Why was coverage for this drug denied? We denied coverage for this drug because: This drug used for MAJOR DEPRESSIVE DISORDER RECURRENT UNSPECIFIED is not an  approved use. Medicare Part D rules states the drug must be used for a medically-accepted  indication. A medically-accepted indication means a use that is approved by the Food and  Drug Administration (FDA), or a use supported by specific resources. These are the [facility] Formulary Service Drug Information and DRUGDEX Information System. Therefore, this  drug cannot be covered under your Medicare Part D benefit  Forwarding to PCP.   Chiquita JAYSON English, RN

## 2023-12-17 NOTE — Telephone Encounter (Signed)
 John- [place] outpatient called and wanted to speak to someone in clinical

## 2023-12-18 ENCOUNTER — Other Ambulatory Visit (HOSPITAL_COMMUNITY): Payer: Self-pay

## 2023-12-20 ENCOUNTER — Other Ambulatory Visit: Payer: Self-pay

## 2023-12-21 ENCOUNTER — Other Ambulatory Visit: Payer: Self-pay

## 2023-12-21 ENCOUNTER — Other Ambulatory Visit (HOSPITAL_COMMUNITY): Payer: Self-pay

## 2023-12-21 NOTE — Telephone Encounter (Signed)
 Patient calls nurse line to inquire about (2) recent prescriptions.   (1) Vyvanse - she reports she still has not heard anything from us  in regards to approval. Advised the medication was denied by her insurance. I called [facility] for any insight, pharmacist reports medicare and medicaid are rejecting medication due to diagnosis.   (2) Crisaborole  Ointment- She reports the label states she can not put around her eyes, she reports the pharmacist told her the same warning. She reports whatever is going on is now in my eye lashes. Called pharmacy, he reports you can try cyclosporine ophthalmic drops for her eyes.   Advised will forward to PCP.

## 2023-12-22 ENCOUNTER — Other Ambulatory Visit (HOSPITAL_COMMUNITY): Payer: Self-pay

## 2023-12-22 ENCOUNTER — Other Ambulatory Visit: Payer: Self-pay

## 2023-12-22 MED ORDER — METHYLPHENIDATE HCL ER (CD) 20 MG PO CPCR
20.0000 mg | ORAL_CAPSULE | ORAL | 0 refills | Status: DC
  Filled 2023-12-22 – 2023-12-23 (×2): qty 30, 30d supply, fill #0

## 2023-12-22 MED ORDER — HYPOCHLOROUS ACID 0.012 % EX SOLN
CUTANEOUS | 12 refills | Status: DC
  Filled 2023-12-22: qty 237, fill #0
  Filled 2023-12-23 – 2024-01-13 (×4): qty 237, 30d supply, fill #0
  Filled 2024-02-01: qty 237, 90d supply, fill #0
  Filled 2024-02-25 – 2024-03-07 (×3): qty 237, 30d supply, fill #0
  Filled 2024-03-29: qty 237, 90d supply, fill #0

## 2023-12-22 NOTE — Telephone Encounter (Signed)
 He said it was about the seroquel .  The patient had said something about she thought she was taking 4 a day instead of 2 a day.

## 2023-12-22 NOTE — Addendum Note (Signed)
 Addended byBETHA ROSALYNN CREDIT L on: 12/22/2023 09:24 AM   Modules accepted: Orders

## 2023-12-22 NOTE — Telephone Encounter (Addendum)
 Ok thanks! She can use the Criaborole on the rest of her skin and I will call in some eye drops.   Appreciate you talking with the pharmacist! You saved me a lot of time . Thanks for being so pro-active! I am going to use something different but this info was very helpful in my decision process.  Please let her I am also sending in a different rx for an additional medicine to treat her depression. 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 Tracey Hall

## 2023-12-23 ENCOUNTER — Other Ambulatory Visit: Payer: Self-pay

## 2023-12-23 ENCOUNTER — Encounter (HOSPITAL_COMMUNITY): Payer: Self-pay

## 2023-12-23 ENCOUNTER — Other Ambulatory Visit (HOSPITAL_COMMUNITY): Payer: Self-pay

## 2023-12-25 ENCOUNTER — Other Ambulatory Visit (HOSPITAL_COMMUNITY): Payer: Self-pay

## 2023-12-27 ENCOUNTER — Other Ambulatory Visit (HOSPITAL_COMMUNITY): Payer: Self-pay

## 2023-12-27 ENCOUNTER — Other Ambulatory Visit: Payer: Self-pay

## 2023-12-27 ENCOUNTER — Telehealth (HOSPITAL_COMMUNITY): Payer: Self-pay | Admitting: Pharmacy Technician

## 2023-12-27 ENCOUNTER — Telehealth (HOSPITAL_COMMUNITY): Payer: Self-pay | Admitting: Pharmacist

## 2023-12-27 ENCOUNTER — Other Ambulatory Visit (HOSPITAL_BASED_OUTPATIENT_CLINIC_OR_DEPARTMENT_OTHER): Payer: Self-pay

## 2023-12-27 ENCOUNTER — Other Ambulatory Visit: Payer: Self-pay | Admitting: Physical Medicine and Rehabilitation

## 2023-12-27 ENCOUNTER — Encounter (HOSPITAL_COMMUNITY): Payer: Self-pay | Admitting: Pharmacist

## 2023-12-27 NOTE — Telephone Encounter (Signed)
 Spoke to [place] at [employer]- cannot find anywhere in my notes increasing seroquel  to 4x/day- and I don't think I would say that for more than 1-2 days, because it increases the QT interval- and since I cannot reach pt's PCP- which I 've attempted multiple times in past, I cannot get an EKG to re-evaluate her QT interval.   I looked for documentation and cannot find it- if anyone can which I mentioned to Kimberly at [facility].   He said he will let Pt know.

## 2023-12-27 NOTE — Telephone Encounter (Signed)
 PA request has been Received. New Encounter has been or will be created for follow up. For additional info see Pharmacy Prior Auth telephone encounter from 12/27/23.

## 2023-12-27 NOTE — Telephone Encounter (Signed)
 Pharmacy Patient Advocate Encounter   Received notification from Patient Pharmacy that prior authorization for Eucrisa  is required/requested.   Insurance verification completed.   The patient is insured through [facility] [place] .   Per test claim: PA required; PA submitted to above mentioned insurance via CoverMyMeds Key/confirmation #/EOC [id number] Status is pending

## 2023-12-28 ENCOUNTER — Other Ambulatory Visit: Payer: Self-pay

## 2023-12-28 ENCOUNTER — Other Ambulatory Visit (HOSPITAL_COMMUNITY): Payer: Self-pay

## 2023-12-28 NOTE — Telephone Encounter (Signed)
 Pharmacy Patient Advocate Encounter  Received notification from [employer] that Prior Authorization for Eucrisa  has been DENIED.  Full denial letter will be uploaded to the media tab. See denial reason below. (Looks like her ins covers quite a few alternatives:   If suggested medication is appropriate, Please send in a new RX and discontinue this one. If not, please advise as to why none of these are appropriate. Please note, some preferred medications may still require a PA.)   PA #/Case ID/Reference #: PA Case ID #: [id number]  Denied. This drug is not covered on the formulary. We are denying your request because we do not show that you have tried at least 2 covered drugs that can treat your condition. You have already tried this formulary alternative: hydrocortisone  . Other covered drug(s) is/are: ala-cort  topical cream 1 %, 2.5 %, alclometasone topical cream 0.05 %, alclometasone topical ointment 0.05 %, betamethasone dipropionate topical cream 0.05 %, betamethasone dipropionate topical lotion 0.05 %, betamethasone dipropionate topical ointment 0.05 %, betamethasone valerate topical cream 0.1 %, betamethasone valerate topical lotion 0.1 %, fluocinolone topical ointment 0.025 %, fluocinolone topical solution 0.01 %, fluocinonide topical cream 0.05 %, fluocinonide topical gel 0.05 %, fluocinonide topical ointment 0.05 %, fluocinonide topical solution 0.05 %, fluocinonide-e topical cream 0.05 %, fluocinonide-emollient topical cream 0.05 %, fluticasone  propionate topical cream 0.05 %, halobetasol propionate topical cream 0.05 %, halobetasol propionate topical ointment 0.05 %, mometasone topical cream 0.1 %, mometasone topical ointment 0.1 %, mometasone topical solution 0.1 %, triamcinolone  acetonide topical cream 0.025 %, 0.1 %, 0.5 %, triamcinolone  acetonide topical lotion 0.025 %, lotion 0.1 %, triamcinolone  acetonide topical ointment 0.025 %, ointment 0.1 %, 0.5 %, halobetasol propionate topical cream 0.05  %; mometasone topical (cream, ointment, solution) 0.1 %; tacrolimus topical ointment 0.1%. We may be able to make an exception to cover this drug. Your doctor will need to send us  medical records showing that you tried this drug. If you cannot take the covered drug, your doctor will need to tell us  why. Note: Some covered drug(s) may have quantity limits.

## 2023-12-29 ENCOUNTER — Other Ambulatory Visit: Payer: Self-pay

## 2023-12-29 ENCOUNTER — Other Ambulatory Visit (HOSPITAL_COMMUNITY): Payer: Self-pay

## 2023-12-29 ENCOUNTER — Telehealth: Payer: Self-pay | Admitting: Pharmacist

## 2023-12-29 NOTE — Telephone Encounter (Signed)
 ERROR

## 2023-12-29 NOTE — Telephone Encounter (Signed)
 After reviewing the list of preferred alternatives and taking into account the patient's documented allergy to hydrocortisone , would Tacrolimus 0.1% ointment be a clinically appropriate option for the patient? If not, please advise and I will proceed with the appeal submission.   Thank you, Devere Pandy, PharmD Clinical Pharmacist  [place]  Direct Dial: [phone]

## 2023-12-30 ENCOUNTER — Other Ambulatory Visit: Payer: Self-pay

## 2023-12-30 ENCOUNTER — Encounter: Payer: Self-pay | Admitting: Physical Medicine and Rehabilitation

## 2023-12-30 ENCOUNTER — Other Ambulatory Visit (HOSPITAL_COMMUNITY): Payer: Self-pay

## 2023-12-30 ENCOUNTER — Telehealth: Payer: Self-pay | Admitting: Pharmacist

## 2023-12-30 NOTE — Telephone Encounter (Signed)
 Appeal has been submitted for Eucrisa . Will advise when response is received, please be advised that most companies may take 30 days to make a decision. Appeal letter and supporting documentation have been faxed to [phone] on 12/30/2023 @6 :06pm.  Thank you, Devere Pandy, PharmD Clinical Pharmacist  [place]  Direct Dial: [phone]

## 2023-12-31 ENCOUNTER — Telehealth: Payer: Self-pay | Admitting: Family Medicine

## 2023-12-31 ENCOUNTER — Other Ambulatory Visit (HOSPITAL_COMMUNITY): Payer: Self-pay

## 2023-12-31 NOTE — Telephone Encounter (Signed)
 Spoke with patient regarding medications.   She does elect [facility], given that we have not been able to find a medication covered by her insurance. She states that she would rather pay the $40 out of pocket and feel better than continue feeling the way she is feeling.   Called and cancelled prescription at [facility]. [facility] as this is [facility].   Patient also asking about Eucrisa . She is asking if good rx can be used for this medication as well at [facility]. Advised patient that I would look into this, however, we are waiting on appeal from insurance. Per Good rx website, cost of medication with coupon would be $800.   Forwarding to PCP.   Tracey JAYSON English, RN

## 2023-12-31 NOTE — Telephone Encounter (Signed)
 After-hours call  Received page for after-hours call.  Attempted to call patient x 2 without answer.  Voicemail left to call back if assistance is still needed.

## 2024-01-03 ENCOUNTER — Telehealth: Admitting: Nurse Practitioner

## 2024-01-03 ENCOUNTER — Other Ambulatory Visit (HOSPITAL_COMMUNITY): Payer: Self-pay

## 2024-01-03 DIAGNOSIS — R300 Dysuria: Secondary | ICD-10-CM

## 2024-01-03 NOTE — Progress Notes (Signed)
 Tracey Hall,   Thank you for submitting an e-visit. Our goal is to always provide the best level of care for you. Based on your symptoms and medical history it is recommended that you are seen in person so that we can collect a urine sample and culture. This assures that you are treated with the best plan.   I feel your condition warrants further evaluation and I recommend that you be seen in a face-to-face visit.   NOTE: There will be NO CHARGE for this E-Visit   If you are having a true medical emergency, please call 911.     For an urgent face to face visit, [facility] has multiple urgent care centers for your convenience.  Click the link below for the full list of locations and hours, walk-in wait times, appointment scheduling options and driving directions:  Urgent Care - [place], [place], [place], [place], [place], [place]  [place]     Your MyChart E-visit questionnaire answers were reviewed by a board certified advanced clinical practitioner to complete your personal care plan based on your specific symptoms.    Thank you for using e-Visits.

## 2024-01-04 ENCOUNTER — Other Ambulatory Visit: Payer: Self-pay | Admitting: Family Medicine

## 2024-01-04 MED ORDER — METHYLPHENIDATE HCL ER (CD) 20 MG PO CPCR: 20.0000 mg | ORAL_CAPSULE | ORAL | 0 refills | Status: DC

## 2024-01-04 MED ORDER — TACROLIMUS 0.03 % EX OINT: TOPICAL_OINTMENT | Freq: Two times a day (BID) | CUTANEOUS | 0 refills | Status: DC

## 2024-01-06 ENCOUNTER — Other Ambulatory Visit (HOSPITAL_COMMUNITY): Payer: Self-pay

## 2024-01-06 ENCOUNTER — Other Ambulatory Visit: Payer: Self-pay

## 2024-01-06 NOTE — Telephone Encounter (Signed)
 Patient calls nurse line in regards to UTI symptoms.  She reports she completed the Keflex  that was given to her on 6/27.   She reports her symptoms have not resolved. She reports she now has a low grade fever, chills and body aches.    She denies noticing any blood in catheter. She does report puss through out the catheter. She reports dark in color.   Patient advised she would need to be evaluated due to her symptoms. Patient reports she does not have the mental or physical capacity to come into the office. She reports she is not going to the emergency department.   Patient advised to call 911 if symptoms worsen. Strongly encouraged in person evaluation.   Will forward to PCP.

## 2024-01-07 ENCOUNTER — Other Ambulatory Visit: Payer: Self-pay

## 2024-01-07 ENCOUNTER — Encounter: Payer: Self-pay | Admitting: Pharmacist

## 2024-01-07 ENCOUNTER — Other Ambulatory Visit (HOSPITAL_COMMUNITY): Payer: Self-pay

## 2024-01-07 ENCOUNTER — Other Ambulatory Visit: Payer: Self-pay | Admitting: Physical Medicine and Rehabilitation

## 2024-01-07 MED ORDER — HYDROCODONE-ACETAMINOPHEN 10-325 MG PO TABS
1.0000 | ORAL_TABLET | Freq: Four times a day (QID) | ORAL | 0 refills | Status: DC | PRN
  Filled 2024-01-07 – 2024-01-13 (×2): qty 120, 30d supply, fill #0

## 2024-01-07 MED ORDER — METHADONE HCL 5 MG PO TABS
5.0000 mg | ORAL_TABLET | Freq: Two times a day (BID) | ORAL | 0 refills | Status: DC
  Filled 2024-01-07 – 2024-01-13 (×2): qty 60, 30d supply, fill #0

## 2024-01-07 NOTE — Telephone Encounter (Signed)
 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  To treat AD-  Start with Nitropaste- and then wipe off when symptoms improve- AD is defined as 20 points above pt's baseline which is usually in her case 70-80/40's-  so AD is when pt's BP is lower than expected.    Can also have severe hypotension- she's had that before as well- and it's hard to tell over the phone- since she's c/o severe abd pain and piercing HA.    Talked to pt for over 23 minutes.   Pt sounds like has severe UTI- and PCP keeps giving Keflex - she needs Urine cx done and more aggressive treatment in SCI patient- she likely will need IV ABX due to significant resistance.

## 2024-01-08 ENCOUNTER — Other Ambulatory Visit (HOSPITAL_COMMUNITY): Payer: Self-pay

## 2024-01-10 ENCOUNTER — Other Ambulatory Visit: Payer: Self-pay

## 2024-01-10 ENCOUNTER — Encounter: Payer: Self-pay | Admitting: Pharmacist

## 2024-01-11 ENCOUNTER — Telehealth: Payer: Self-pay

## 2024-01-11 NOTE — Telephone Encounter (Signed)
 Tracey Hall with [employer] calls nurse line in regards to DME order.   She reports she faxed over an order for an electric arm brace per patient request. Order is in PCP box.   I reviewed form. Patient will need OV documentation for supporting DME order.   Tracey Hall reports she will call patient and advise her of this. She reports she can do a virtual visit for documentation as well.   She reports nothing further for PCP to do until patient has a visit discussing the need for DME.

## 2024-01-12 ENCOUNTER — Other Ambulatory Visit (HOSPITAL_COMMUNITY): Payer: Self-pay

## 2024-01-12 ENCOUNTER — Other Ambulatory Visit: Payer: Self-pay

## 2024-01-12 ENCOUNTER — Other Ambulatory Visit: Payer: Self-pay | Admitting: Family Medicine

## 2024-01-13 ENCOUNTER — Other Ambulatory Visit: Payer: Self-pay | Admitting: Family Medicine

## 2024-01-13 ENCOUNTER — Other Ambulatory Visit (HOSPITAL_COMMUNITY): Payer: Self-pay

## 2024-01-13 ENCOUNTER — Other Ambulatory Visit: Payer: Self-pay

## 2024-01-13 MED ORDER — CEPHALEXIN 500 MG PO CAPS
500.0000 mg | ORAL_CAPSULE | Freq: Three times a day (TID) | ORAL | 0 refills | Status: DC
  Filled 2024-01-13: qty 21, 7d supply, fill #0

## 2024-01-13 MED ORDER — ALBUTEROL SULFATE HFA 108 (90 BASE) MCG/ACT IN AERS
2.0000 | INHALATION_SPRAY | Freq: Four times a day (QID) | RESPIRATORY_TRACT | 2 refills | Status: DC | PRN
  Filled 2024-01-13 – 2024-02-01 (×2): qty 18, 25d supply, fill #0
  Filled 2024-03-21: qty 18, 25d supply, fill #1
  Filled 2024-03-30: qty 18, 25d supply, fill #2

## 2024-01-14 ENCOUNTER — Other Ambulatory Visit: Payer: Self-pay

## 2024-01-14 ENCOUNTER — Other Ambulatory Visit (HOSPITAL_COMMUNITY): Payer: Self-pay

## 2024-01-17 ENCOUNTER — Other Ambulatory Visit: Payer: Self-pay | Admitting: Family Medicine

## 2024-01-17 ENCOUNTER — Ambulatory Visit

## 2024-01-17 MED ORDER — AMOXICILLIN-POT CLAVULANATE 875-125 MG PO TABS
1.0000 | ORAL_TABLET | Freq: Two times a day (BID) | ORAL | 0 refills | Status: DC
  Filled 2024-01-20 (×3): qty 10, 5d supply, fill #0

## 2024-01-18 ENCOUNTER — Other Ambulatory Visit (HOSPITAL_COMMUNITY): Payer: Self-pay

## 2024-01-18 ENCOUNTER — Other Ambulatory Visit: Payer: Self-pay

## 2024-01-18 NOTE — Telephone Encounter (Signed)
 Insurance approved the appeal for Eucrisa :    Thank you, Devere Pandy, PharmD Clinical Pharmacist  [place]  Direct Dial: [phone]

## 2024-01-19 ENCOUNTER — Other Ambulatory Visit: Payer: Self-pay

## 2024-01-19 ENCOUNTER — Other Ambulatory Visit (HOSPITAL_COMMUNITY): Payer: Self-pay

## 2024-01-20 ENCOUNTER — Other Ambulatory Visit (HOSPITAL_COMMUNITY): Payer: Self-pay

## 2024-01-20 ENCOUNTER — Other Ambulatory Visit: Payer: Self-pay

## 2024-01-21 ENCOUNTER — Other Ambulatory Visit: Payer: Self-pay

## 2024-01-21 NOTE — Telephone Encounter (Signed)
 She Will need in person ov for this I am pretty sure

## 2024-01-24 ENCOUNTER — Other Ambulatory Visit (HOSPITAL_COMMUNITY): Payer: Self-pay

## 2024-02-01 ENCOUNTER — Telehealth: Payer: Self-pay

## 2024-02-01 ENCOUNTER — Other Ambulatory Visit (HOSPITAL_COMMUNITY): Payer: Self-pay

## 2024-02-01 ENCOUNTER — Other Ambulatory Visit: Payer: Self-pay | Admitting: Family Medicine

## 2024-02-01 ENCOUNTER — Encounter

## 2024-02-01 ENCOUNTER — Other Ambulatory Visit: Payer: Self-pay | Admitting: Physical Medicine and Rehabilitation

## 2024-02-01 ENCOUNTER — Other Ambulatory Visit: Payer: Self-pay

## 2024-02-01 MED ORDER — FLUDROCORTISONE ACETATE 0.1 MG PO TABS
0.2000 mg | ORAL_TABLET | Freq: Every day | ORAL | 5 refills | Status: DC
  Filled 2024-02-01 – 2024-02-02 (×3): qty 30, 15d supply, fill #0
  Filled 2024-02-17 – 2024-02-22 (×2): qty 60, 30d supply, fill #0
  Filled 2024-03-21 – 2024-03-28 (×3): qty 60, 30d supply, fill #1
  Filled 2024-04-20: qty 60, 30d supply, fill #2

## 2024-02-01 MED ORDER — METHADONE HCL 5 MG PO TABS
5.0000 mg | ORAL_TABLET | Freq: Two times a day (BID) | ORAL | 0 refills | Status: DC
  Filled 2024-02-01 – 2024-02-11 (×3): qty 60, 30d supply, fill #0

## 2024-02-01 MED ORDER — HYDROCODONE-ACETAMINOPHEN 10-325 MG PO TABS
1.0000 | ORAL_TABLET | Freq: Four times a day (QID) | ORAL | 0 refills | Status: DC | PRN
  Filled 2024-02-01 – 2024-03-21 (×6): qty 120, 30d supply, fill #0

## 2024-02-01 MED ORDER — QUETIAPINE FUMARATE 50 MG PO TABS
50.0000 mg | ORAL_TABLET | Freq: Two times a day (BID) | ORAL | 5 refills | Status: DC | PRN
  Filled 2024-02-01 – 2024-02-22 (×3): qty 60, 30d supply, fill #0
  Filled 2024-03-21 – 2024-03-28 (×3): qty 60, 30d supply, fill #1

## 2024-02-01 NOTE — Telephone Encounter (Signed)
 Tracey Hall left a voice message for call back today. Patient was called twice with no answer.  A message has been left advising her to call back in the morning. Or is she has an emergency seek help at the nearest ER. She can also send a MyChart message for non-emergent needs.

## 2024-02-01 NOTE — Telephone Encounter (Signed)
 Patient calls nurse line regarding recurrent UTI symptoms.   She states that she has been having diarrhea for two weeks now and is concerned that this has contributed to UTI. Mentioned possible E Coli infection. She has been prescribed amoxicillin  and Keflex  for recent UTIs and this has not been working.   She reports the following symptoms.  - decreased appetite -inability to sleep -fatigue -low back and abdominal pain -low grade fever (100.6), states this has been present for the last 1.5 months.  -headaches  I advised that patient should really be evaluated by provider and obtain urine sample for culture and sensitivity. She states that she discussed with Dr. Rosalynn having someone pick up sterile urine cup for sample to be provided and then drop off for analysis.   Patient reports that she does not have a wheelchair to come into the office. She is also not wanting to go to the ED.   Advised that I would send message to PCP, however, she is out of the office this week.   Counseled on ED precautions.   Please advise if UA and culture can be obtained without OV.   Chiquita JAYSON English, RN

## 2024-02-02 ENCOUNTER — Other Ambulatory Visit (HOSPITAL_COMMUNITY): Payer: Self-pay

## 2024-02-02 ENCOUNTER — Other Ambulatory Visit: Payer: Self-pay

## 2024-02-03 ENCOUNTER — Other Ambulatory Visit (HOSPITAL_COMMUNITY): Payer: Self-pay

## 2024-02-04 ENCOUNTER — Other Ambulatory Visit (HOSPITAL_COMMUNITY): Payer: Self-pay

## 2024-02-04 NOTE — Telephone Encounter (Signed)
 Called patient, she did not answer.   LVM asking patient to return call to office.   Chiquita JAYSON English, RN

## 2024-02-05 ENCOUNTER — Other Ambulatory Visit (HOSPITAL_COMMUNITY): Payer: Self-pay

## 2024-02-07 ENCOUNTER — Telehealth: Admitting: Physician Assistant

## 2024-02-07 ENCOUNTER — Telehealth: Payer: Self-pay | Admitting: Family Medicine

## 2024-02-07 ENCOUNTER — Other Ambulatory Visit: Payer: Self-pay

## 2024-02-07 ENCOUNTER — Telehealth: Payer: Self-pay

## 2024-02-07 DIAGNOSIS — Z91199 Patient's noncompliance with other medical treatment and regimen due to unspecified reason: Secondary | ICD-10-CM

## 2024-02-07 NOTE — Telephone Encounter (Signed)
**  After Hours/ Emergency Line Call**  Received a page to call [phone]) - [phone].  Patient: Tracey Hall  Caller: Self  Confirmed name & DOB of patient with caller  Subjective:  Patients calls requesting antibiotics for UTI. Reports her urine smells bad. Maybe has had some increased suprapubic pain and dysuria. Difficult for her to tell secondary to quadriplegia. Has suprapubic catheter. Reports multiple other symptoms headache, nausea.  Reports she called earlier today to our clinic. (I do not see this in the chart). Reports max temperature of 100.1.  Patient reports she has been in the process of trying to specimen cup for ongoing diarrhea, likely secondary to frequent antibiotics.   Also reports when she gets UTIs she has frequent blisters, descriptions sounds like boils, but also states this is due to eczema.  Objective:  Observations: NAD comfortably talking over the phone.,   Assessment & Plan  Tracey Hall is a 43 y.o. female with PMHx s/f quadriplegia who calls with the following complaints and concerns:   Conglomerate of symptoms may be suggestive of urinary tract infection.  No red flags to suggest need for emergent car.  Discussed with patient that I cannot prescribe antibiotics over the phone per our clinic policy.  Recommended as she has multiple medical concerns best course of action would be to schedule her in our clinic to which she was agreeable.   Recommendations:  Scheduled for appointment in clinic tomorrow access to care. Recommended for any difficulty breathing or feeling like she is going to pass out to go to the ED.   -- Red flags discussed.   -- Will forward to PCP.  Ozell Provencal, MD, PGY-3 [facility] 7:24 PM 02/07/2024

## 2024-02-07 NOTE — Progress Notes (Signed)
 The patient no-showed for appointment despite this provider sending direct link, reaching out via phone with no response and waiting for at least 10 minutes from appointment time for patient to join. They will be marked as a NS for this appointment/time.   Laure Kidney, PA-C

## 2024-02-07 NOTE — Telephone Encounter (Signed)
 Patient called stating she wants you to call her.

## 2024-02-08 ENCOUNTER — Other Ambulatory Visit (HOSPITAL_COMMUNITY): Payer: Self-pay

## 2024-02-08 ENCOUNTER — Ambulatory Visit: Payer: Self-pay

## 2024-02-08 NOTE — Telephone Encounter (Signed)
 Patient is calling and desperately need you to call her about her cath supplies and urine sample.

## 2024-02-08 NOTE — Telephone Encounter (Signed)
 Patient returns call to nurse line regarding previous messages.   She spoke with Dr. Alba last night regarding host of symptoms. He had scheduled her appointment in [facility] this afternoon, however, she is not able to come in due to transportation and not having appropriate wheelchair to come into the office.   Next catheter is not due until next month. She is only covered for one every 30 days. Patient reports that to be able to obtain sample, she would need to change catheter.   Strongly advised that due to symptoms, patient be evaluated. Patient does not want to go to the ED at this time.   Unsure how to proceed with possible infection and inability to be seen in person.   Advised patient that I would forward message to Dr. Rosalynn to see if she could possibly call her to discuss plan further.   Chiquita JAYSON English, RN

## 2024-02-08 NOTE — Telephone Encounter (Signed)
 Patient calls nurse line in regards to UTI symptoms.   She reports she is not able to give a urine sample without replacing her catheter. She reports she will not be able to replace her catheter until Friday due to payment. She reports she will have to pay out of pocket at [facility]   She is requesting an indwelling catheter from our office or another round of antibiotics.   Advised we do not carry indwelling caths here at [facility]. She reports she does not feel she can safely wait until Friday. She reports she feels septic already.   Patient reports she can not safely come to the office for an apt due to her wheelchair and condition.   I strongly encouraged ED visit. She refuses.   Will forward to PCP.

## 2024-02-10 NOTE — Telephone Encounter (Signed)
 Attempted ot call pt- 8/21 5:02 pm

## 2024-02-10 NOTE — Telephone Encounter (Signed)
 Patient returns call to nurse line. She is asking if Dr. Rosalynn is going to be able to send in abx per her urine results.   Patient reports that urine was dropped off yesterday afternoon.   Advised patient that our office was closed yesterday afternoon. Patient attempting to reach out to person who dropped off sample to determine location of drop off.   Phone was disconnected. Attempted to call patient back, she did not answer.   Chiquita JAYSON English, RN

## 2024-02-11 ENCOUNTER — Other Ambulatory Visit: Payer: Self-pay

## 2024-02-11 ENCOUNTER — Other Ambulatory Visit (HOSPITAL_COMMUNITY): Payer: Self-pay

## 2024-02-11 NOTE — Telephone Encounter (Signed)
 L/M on voicemail about her calls- explained that I've personally attempted ot reach her 2 different days ina row- and our Nurse Robbin has also attempted 3 different times- always going to voicemail.  I explained I will be out of town next week, but if she has specific concerns about needing catheters, etc, to send a my chart message, so can be addressed.

## 2024-02-14 ENCOUNTER — Other Ambulatory Visit (HOSPITAL_COMMUNITY): Payer: Self-pay

## 2024-02-17 ENCOUNTER — Other Ambulatory Visit: Payer: Self-pay

## 2024-02-18 ENCOUNTER — Other Ambulatory Visit: Payer: Self-pay

## 2024-02-22 ENCOUNTER — Other Ambulatory Visit: Payer: Self-pay

## 2024-02-23 ENCOUNTER — Other Ambulatory Visit: Payer: Self-pay

## 2024-02-24 ENCOUNTER — Other Ambulatory Visit: Payer: Self-pay

## 2024-02-24 ENCOUNTER — Other Ambulatory Visit (HOSPITAL_COMMUNITY): Payer: Self-pay

## 2024-02-25 ENCOUNTER — Other Ambulatory Visit: Payer: Self-pay

## 2024-02-25 ENCOUNTER — Other Ambulatory Visit (HOSPITAL_COMMUNITY): Payer: Self-pay

## 2024-02-25 ENCOUNTER — Other Ambulatory Visit: Payer: Self-pay | Admitting: Family Medicine

## 2024-02-28 ENCOUNTER — Other Ambulatory Visit (HOSPITAL_COMMUNITY): Payer: Self-pay

## 2024-02-28 ENCOUNTER — Other Ambulatory Visit: Payer: Self-pay

## 2024-02-29 ENCOUNTER — Encounter: Payer: Self-pay | Admitting: Pharmacist

## 2024-02-29 ENCOUNTER — Other Ambulatory Visit: Payer: Self-pay

## 2024-03-03 ENCOUNTER — Other Ambulatory Visit: Payer: Self-pay

## 2024-03-07 ENCOUNTER — Other Ambulatory Visit (HOSPITAL_COMMUNITY): Payer: Self-pay

## 2024-03-08 ENCOUNTER — Other Ambulatory Visit: Payer: Self-pay

## 2024-03-08 ENCOUNTER — Other Ambulatory Visit (HOSPITAL_COMMUNITY): Payer: Self-pay

## 2024-03-09 ENCOUNTER — Other Ambulatory Visit: Payer: Self-pay

## 2024-03-09 ENCOUNTER — Encounter: Payer: Self-pay | Admitting: Family Medicine

## 2024-03-15 ENCOUNTER — Other Ambulatory Visit: Payer: Self-pay

## 2024-03-17 ENCOUNTER — Observation Stay (HOSPITAL_COMMUNITY)

## 2024-03-17 ENCOUNTER — Encounter: Payer: Self-pay | Admitting: Physical Medicine and Rehabilitation

## 2024-03-17 ENCOUNTER — Encounter: Attending: Physical Medicine and Rehabilitation | Admitting: Physical Medicine and Rehabilitation

## 2024-03-17 ENCOUNTER — Ambulatory Visit: Admitting: Physical Medicine and Rehabilitation

## 2024-03-17 ENCOUNTER — Emergency Department (HOSPITAL_COMMUNITY)

## 2024-03-17 ENCOUNTER — Observation Stay (HOSPITAL_COMMUNITY)
Admission: EM | Admit: 2024-03-17 | Discharge: 2024-03-18 | Discharge status: Against Medical Advice | Attending: Family Medicine | Admitting: Family Medicine

## 2024-03-17 VITALS — BP 82/49 | HR 62 | Ht 65.0 in

## 2024-03-17 DIAGNOSIS — F329 Major depressive disorder, single episode, unspecified: Secondary | ICD-10-CM | POA: Insufficient documentation

## 2024-03-17 DIAGNOSIS — K269 Duodenal ulcer, unspecified as acute or chronic, without hemorrhage or perforation: Secondary | ICD-10-CM | POA: Diagnosis not present

## 2024-03-17 DIAGNOSIS — N390 Urinary tract infection, site not specified: Secondary | ICD-10-CM | POA: Diagnosis present

## 2024-03-17 DIAGNOSIS — R4189 Other symptoms and signs involving cognitive functions and awareness: Secondary | ICD-10-CM | POA: Diagnosis present

## 2024-03-17 DIAGNOSIS — R55 Syncope and collapse: Secondary | ICD-10-CM | POA: Diagnosis not present

## 2024-03-17 DIAGNOSIS — Z789 Other specified health status: Secondary | ICD-10-CM

## 2024-03-17 DIAGNOSIS — E46 Unspecified protein-calorie malnutrition: Secondary | ICD-10-CM | POA: Insufficient documentation

## 2024-03-17 DIAGNOSIS — Z87891 Personal history of nicotine dependence: Secondary | ICD-10-CM | POA: Diagnosis not present

## 2024-03-17 DIAGNOSIS — G904 Autonomic dysreflexia: Secondary | ICD-10-CM | POA: Diagnosis not present

## 2024-03-17 DIAGNOSIS — D649 Anemia, unspecified: Principal | ICD-10-CM

## 2024-03-17 DIAGNOSIS — E876 Hypokalemia: Secondary | ICD-10-CM | POA: Insufficient documentation

## 2024-03-17 DIAGNOSIS — Z993 Dependence on wheelchair: Secondary | ICD-10-CM | POA: Diagnosis not present

## 2024-03-17 DIAGNOSIS — F4320 Adjustment disorder, unspecified: Secondary | ICD-10-CM | POA: Insufficient documentation

## 2024-03-17 DIAGNOSIS — E669 Obesity, unspecified: Secondary | ICD-10-CM | POA: Diagnosis not present

## 2024-03-17 DIAGNOSIS — K259 Gastric ulcer, unspecified as acute or chronic, without hemorrhage or perforation: Secondary | ICD-10-CM | POA: Insufficient documentation

## 2024-03-17 DIAGNOSIS — R404 Transient alteration of awareness: Principal | ICD-10-CM

## 2024-03-17 DIAGNOSIS — R4182 Altered mental status, unspecified: Secondary | ICD-10-CM | POA: Diagnosis present

## 2024-03-17 LAB — I-STAT VENOUS BLOOD GAS, ED
Acid-Base Excess: 1 mmol/L (ref 0.0–2.0)
Bicarbonate: 24.4 mmol/L (ref 20.0–28.0)
Calcium, Ion: 1.1 mmol/L — ABNORMAL LOW (ref 1.15–1.40)
HCT: 21 % — ABNORMAL LOW (ref 36.0–46.0)
Hemoglobin: 7.1 g/dL — ABNORMAL LOW (ref 12.0–15.0)
O2 Saturation: 100 %
Potassium: 3.1 mmol/L — ABNORMAL LOW (ref 3.5–5.1)
Sodium: 138 mmol/L (ref 135–145)
TCO2: 25 mmol/L (ref 22–32)
pCO2, Ven: 33.4 mmHg — ABNORMAL LOW (ref 44–60)
pH, Ven: 7.473 — ABNORMAL HIGH (ref 7.25–7.43)
pO2, Ven: 158 mmHg — ABNORMAL HIGH (ref 32–45)

## 2024-03-17 LAB — COMPREHENSIVE METABOLIC PANEL WITH GFR
ALT: 13 U/L (ref 0–44)
AST: 16 U/L (ref 15–41)
Albumin: 3.6 g/dL (ref 3.5–5.0)
Alkaline Phosphatase: 107 U/L (ref 38–126)
Anion gap: 11 (ref 5–15)
BUN: 15 mg/dL (ref 6–20)
CO2: 22 mmol/L (ref 22–32)
Calcium: 8.5 mg/dL — ABNORMAL LOW (ref 8.9–10.3)
Chloride: 104 mmol/L (ref 98–111)
Creatinine, Ser: 0.56 mg/dL (ref 0.44–1.00)
GFR, Estimated: >60 mL/min (ref >60)
Glucose, Bld: 78 mg/dL (ref 70–99)
Potassium: 3.1 mmol/L — ABNORMAL LOW (ref 3.5–5.1)
Sodium: 137 mmol/L (ref 135–145)
Total Bilirubin: 0.5 mg/dL (ref 0.0–1.2)
Total Protein: 7.4 g/dL (ref 6.5–8.1)

## 2024-03-17 LAB — URINALYSIS, ROUTINE W REFLEX MICROSCOPIC
Bilirubin Urine: NEGATIVE
Glucose, UA: NEGATIVE mg/dL
Hgb urine dipstick: NEGATIVE
Ketones, ur: NEGATIVE mg/dL
Nitrite: POSITIVE — AB
Protein, ur: NEGATIVE mg/dL
Specific Gravity, Urine: 1.02 (ref 1.005–1.030)
pH: 5.5 (ref 5.0–8.0)

## 2024-03-17 LAB — CBC WITH DIFFERENTIAL/PLATELET
Abs Immature Granulocytes: 0.02 K/uL (ref 0.00–0.07)
Basophils Absolute: 0 K/uL (ref 0.0–0.1)
Basophils Relative: 0 %
Eosinophils Absolute: 0.3 K/uL (ref 0.0–0.5)
Eosinophils Relative: 4 %
HCT: 20.7 % — ABNORMAL LOW (ref 36.0–46.0)
Hemoglobin: 5.4 g/dL — CL (ref 12.0–15.0)
Immature Granulocytes: 0 %
Lymphocytes Relative: 32 %
Lymphs Abs: 2.3 K/uL (ref 0.7–4.0)
MCH: 18.4 pg — ABNORMAL LOW (ref 26.0–34.0)
MCHC: 26.1 g/dL — ABNORMAL LOW (ref 30.0–36.0)
MCV: 70.6 fL — ABNORMAL LOW (ref 80.0–100.0)
Monocytes Absolute: 0.5 K/uL (ref 0.1–1.0)
Monocytes Relative: 7 %
Neutro Abs: 4.1 K/uL (ref 1.7–7.7)
Neutrophils Relative %: 57 %
Platelets: 487 K/uL — ABNORMAL HIGH (ref 150–400)
RBC: 2.93 MIL/uL — ABNORMAL LOW (ref 3.87–5.11)
RDW: 19.2 % — ABNORMAL HIGH (ref 11.5–15.5)
WBC: 7.2 K/uL (ref 4.0–10.5)
nRBC: 0 % (ref 0.0–0.2)

## 2024-03-17 LAB — RAPID URINE DRUG SCREEN, HOSP PERFORMED
Amphetamines: NOT DETECTED
Barbiturates: NOT DETECTED
Benzodiazepines: POSITIVE — AB
Cocaine: NOT DETECTED
Opiates: NOT DETECTED
Tetrahydrocannabinol: NOT DETECTED

## 2024-03-17 LAB — I-STAT CHEM 8, ED
BUN: 15 mg/dL (ref 6–20)
Calcium, Ion: 1.11 mmol/L — ABNORMAL LOW (ref 1.15–1.40)
Chloride: 102 mmol/L (ref 98–111)
Creatinine, Ser: 0.6 mg/dL (ref 0.44–1.00)
Glucose, Bld: 79 mg/dL (ref 70–99)
HCT: 21 % — ABNORMAL LOW (ref 36.0–46.0)
Hemoglobin: 7.1 g/dL — ABNORMAL LOW (ref 12.0–15.0)
Potassium: 3.1 mmol/L — ABNORMAL LOW (ref 3.5–5.1)
Sodium: 138 mmol/L (ref 135–145)
TCO2: 24 mmol/L (ref 22–32)

## 2024-03-17 LAB — SALICYLATE LEVEL: Salicylate Lvl: <7 mg/dL — ABNORMAL LOW (ref 7.0–30.0)

## 2024-03-17 LAB — ACETAMINOPHEN LEVEL: Acetaminophen (Tylenol), Serum: 10 ug/mL (ref 10–30)

## 2024-03-17 LAB — HCG, SERUM, QUALITATIVE: Preg, Serum: NEGATIVE

## 2024-03-17 LAB — PREPARE RBC (CROSSMATCH)

## 2024-03-17 LAB — URINALYSIS, MICROSCOPIC (REFLEX)

## 2024-03-17 LAB — HEMOGLOBIN AND HEMATOCRIT, BLOOD
HCT: 20.2 % — ABNORMAL LOW (ref 36.0–46.0)
Hemoglobin: 5.4 g/dL — CL (ref 12.0–15.0)

## 2024-03-17 LAB — I-STAT CG4 LACTIC ACID, ED: Lactic Acid, Venous: 1.5 mmol/L (ref 0.5–1.9)

## 2024-03-17 MED ORDER — SODIUM CHLORIDE 0.9% FLUSH
3.0000 mL | Freq: Two times a day (BID) | INTRAVENOUS | Status: DC
  Administered 2024-03-17: 3 mL via INTRAVENOUS

## 2024-03-17 MED ORDER — PANTOPRAZOLE SODIUM 40 MG IV SOLR
40.0000 mg | Freq: Two times a day (BID) | INTRAVENOUS | Status: DC
  Administered 2024-03-17: 40 mg via INTRAVENOUS
  Filled 2024-03-17: qty 10

## 2024-03-17 MED ORDER — OXYBUTYNIN CHLORIDE 5 MG PO TABS
5.0000 mg | ORAL_TABLET | Freq: Three times a day (TID) | ORAL | Status: DC
Start: 2024-03-17 — End: 2024-03-18
  Administered 2024-03-17: 5 mg via ORAL
  Filled 2024-03-17: qty 1

## 2024-03-17 MED ORDER — POLYETHYLENE GLYCOL 3350 17 G PO PACK: 17.0000 g | PACK | Freq: Every day | ORAL | Status: DC | PRN

## 2024-03-17 MED ORDER — DULOXETINE HCL 60 MG PO CPEP
60.0000 mg | ORAL_CAPSULE | Freq: Every day | ORAL | Status: DC
  Administered 2024-03-17: 60 mg via ORAL
  Filled 2024-03-17: qty 2

## 2024-03-17 MED ORDER — SODIUM CHLORIDE 0.9% IV SOLUTION: Freq: Once | INTRAVENOUS | Status: AC

## 2024-03-17 MED ORDER — TACROLIMUS 0.03 % EX OINT: TOPICAL_OINTMENT | Freq: Two times a day (BID) | CUTANEOUS | Status: DC

## 2024-03-17 MED ORDER — SODIUM CHLORIDE 0.9 % IV BOLUS
1000.0000 mL | Freq: Once | INTRAVENOUS | Status: AC
  Administered 2024-03-17: 1000 mL via INTRAVENOUS

## 2024-03-17 MED ORDER — FLUDROCORTISONE ACETATE 0.1 MG PO TABS: 0.2000 mg | ORAL_TABLET | Freq: Every day | ORAL | Status: DC

## 2024-03-17 MED ORDER — POTASSIUM CHLORIDE CRYS ER 20 MEQ PO TBCR
40.0000 meq | EXTENDED_RELEASE_TABLET | Freq: Once | ORAL | Status: AC
  Administered 2024-03-17: 40 meq via ORAL
  Filled 2024-03-17: qty 2

## 2024-03-17 MED ORDER — LIDOCAINE 5 % EX PTCH: 3.0000 | MEDICATED_PATCH | CUTANEOUS | Status: DC

## 2024-03-17 MED ORDER — DIAZEPAM 5 MG PO TABS: 5.0000 mg | ORAL_TABLET | Freq: Two times a day (BID) | ORAL | Status: DC | PRN

## 2024-03-17 MED ORDER — ACETAMINOPHEN 325 MG PO TABS: 650.0000 mg | ORAL_TABLET | Freq: Four times a day (QID) | ORAL | Status: DC | PRN

## 2024-03-17 MED ORDER — NALOXONE HCL 2 MG/2ML IJ SOSY: 0.4000 mg | PREFILLED_SYRINGE | INTRAMUSCULAR | Status: DC | PRN

## 2024-03-17 MED ORDER — METHADONE HCL 5 MG PO TABS
5.0000 mg | ORAL_TABLET | Freq: Two times a day (BID) | ORAL | Status: DC
  Administered 2024-03-17: 5 mg via ORAL
  Filled 2024-03-17: qty 1

## 2024-03-17 MED ORDER — HYDROCODONE-ACETAMINOPHEN 10-325 MG PO TABS: 1.0000 | ORAL_TABLET | Freq: Four times a day (QID) | ORAL | Status: DC | PRN

## 2024-03-17 MED ORDER — QUETIAPINE FUMARATE ER 50 MG PO TB24
50.0000 mg | ORAL_TABLET | Freq: Every day | ORAL | Status: DC
Start: 2024-03-17 — End: 2024-03-18
  Administered 2024-03-17: 50 mg via ORAL
  Filled 2024-03-17: qty 1

## 2024-03-17 MED ORDER — NALOXONE HCL 0.4 MG/ML IJ SOLN: INTRAMUSCULAR | Status: DC

## 2024-03-17 MED ORDER — GABAPENTIN 300 MG PO CAPS
600.0000 mg | ORAL_CAPSULE | Freq: Three times a day (TID) | ORAL | Status: DC
Start: 2024-03-17 — End: 2024-03-18
  Administered 2024-03-17: 600 mg via ORAL
  Filled 2024-03-17: qty 2

## 2024-03-17 MED ORDER — TIZANIDINE HCL 4 MG PO TABS
4.0000 mg | ORAL_TABLET | Freq: Four times a day (QID) | ORAL | Status: DC
Start: 2024-03-17 — End: 2024-03-18
  Administered 2024-03-17: 4 mg via ORAL
  Filled 2024-03-17: qty 1

## 2024-03-17 MED ORDER — ACETAMINOPHEN 650 MG RE SUPP: 650.0000 mg | Freq: Four times a day (QID) | RECTAL | Status: DC | PRN

## 2024-03-17 MED ORDER — CEFTRIAXONE SODIUM 1 G IJ SOLR: 1.0000 g | INTRAMUSCULAR | Status: DC

## 2024-03-17 MED ORDER — FENTANYL CITRATE PF 50 MCG/ML IJ SOSY
50.0000 ug | PREFILLED_SYRINGE | Freq: Once | INTRAMUSCULAR | Status: AC
  Administered 2024-03-17: 50 ug via INTRAVENOUS
  Filled 2024-03-17: qty 1

## 2024-03-17 NOTE — Assessment & Plan Note (Addendum)
 Potassium 3.1 on admission - Daily BMP w/ Mg - Supplement as needed

## 2024-03-17 NOTE — Progress Notes (Signed)
 NARCAN  0.4MG /ML GIVEN IM LEFT DELTOID X1 FROM FLOOR STOCK NDC 36000-308-01 LOT [id number] EXP 11/302026

## 2024-03-17 NOTE — Assessment & Plan Note (Addendum)
 Major depressive disorder : Continue diazepam  (VALIUM ) tablet 5 mg Q12H PRN, DULoxetine  (CYMBALTA ) DR capsule 60 mg Daily, QUEtiapine  (SEROQUEL  XR) 24 hr tablet 50 mg at bedtime- reported as BID PRN, will schedule for now. Quadriplegia / Chronic nerve pain : Continue acetaminophen  (TYLENOL ) tablet 650 mg Q6H PRN, gabapentin  (NEURONTIN ) tablet 600 mg TID, HYDROcodone -acetaminophen  (NORCO) 10-325 MG per tablet 1 tablet Q6H PRN, lidocaine  (LIDODERM ) 5 % 3 patch  Q12H, tiZANidine  (ZANAFLEX ) tablet 4 mg TID Neurogenic bladder. Hx of foley dependence but foley was removed pre-hospitalize ~2 weeks ago. Continue Female external catheter w/ strict I&O, Bladder scan . Continue oxybutynin  (DITROPAN ) tablet 5 mg TID PUD : Starting on Protonix  40 mg Daily

## 2024-03-17 NOTE — Assessment & Plan Note (Addendum)
 Patient states has been mainly eating baby power and ice chips at home. Poor nutrition intake - Dietitian consult - Pending Nutrition labs, B12, Folate, Ferratin. Iron 
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
## 2024-03-17 NOTE — Assessment & Plan Note (Addendum)
 Presented after syncope episode during the start of visit with PM&R physician, HgB on admission was 5.4. 2 units pRBC was given in ED. Noted to have RUQ abd pain on exam. - Admit to FMTS, attending Dr. Donzetta  - Vital signs per floor  - Tele - Regular diet  - Pending RUQ US   - Consider GI consult for colonoscopy - PT/OT to treat - VTE prophylaxis SCD - Q6 CBC - AM BMP with Mg  - Fall precautions - Delirium precautions

## 2024-03-17 NOTE — ED Notes (Signed)
 Pt requesting her methodone, 20mg  once a day at night. Provider notified. Awaiting orders.
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
## 2024-03-18 ENCOUNTER — Other Ambulatory Visit (HOSPITAL_COMMUNITY): Payer: Self-pay

## 2024-03-18 DIAGNOSIS — D649 Anemia, unspecified: Secondary | ICD-10-CM | POA: Diagnosis not present
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
## 2024-03-18 NOTE — Progress Notes (Signed)
 Patient arrived to unit, leaning over in bed. Introduced self to patient and plan of care for shift. Pt continues to lean over in bed, visibly soiled. Asked patient if we could help clean her up. Patient says NO. Asked patient if she was in pain. Pt remains silent. Asked pt if we could perform an assessment. Pt remains silent. Asked patient if there was anything we could do for her. Pt states I'm getting out of here, tell the doctor to put in my discharge orders. Attempted to ask patient reasoning for wanting discharge and if there was anything we could do to help. Patient states I need a minute. Told patient we would come back to assess in a while to give her time. Patient began to use profanity and stated I dont need yall to come back, stay out of my face and get the discharge paperwork. Patient continued to make offensive remarks. Pt states  I will shoot you. Pt states  I have someone that will come here and take the entire unit out .Charge nurse notified and at bedside. Physician notified. Security notified.Pt continues to be verbally abusive. Pt family member arrives to bedside.  Attempted to give AMA paper to patient. Patient refused to sign. Security at bedside. Patient's IV removed. Physicians at bedside.

## 2024-03-20 ENCOUNTER — Telehealth: Payer: Self-pay | Admitting: Family Medicine

## 2024-03-20 ENCOUNTER — Other Ambulatory Visit: Payer: Self-pay

## 2024-03-20 MED ORDER — CEPHALEXIN 500 MG PO CAPS: 500.0000 mg | ORAL_CAPSULE | Freq: Three times a day (TID) | ORAL | 0 refills | Status: DC

## 2024-03-20 NOTE — Telephone Encounter (Signed)
 I connected with  Tracey Hall on 03/20/24 by a telephone and verified that I am speaking with the correct person using two identifiers. Phone call 13:51 - 14:17 approx   Returned her call regarding recent hospital admission ending in leaving AMA.She told me about treatment by staff on her admission in first 30 minutes on [facility]. Said she hates hospitals because she gets treated poorly typically. This time she felt degraded, not listened to. She became frightened and angry.  She said the staff treated her as if she were "retarded".  They did not listen to her needs when trying to move her (quadriplegia) Press photographer, security, were  evidently called and results were unfortunately that the patient leaving without getting further work up treatment. Her hemoglobin was very low at 5.4. She said she received some part of a transfusion. We explored possibility of getting a blood draw by [facility] nurse which she would be open to. If she still low which I fully expect, we could then explore day hospital admission fortransfusion, perhaps at a time when her husband could be with her so she would feel safer.  I will call her back tomorrow with update on possibility of HH. She had UA with positive nitrites, culture has not resulted; I am not sure it was ever completed. Will check. and she would like treatment so I will send abx in to [facility]

## 2024-03-21 ENCOUNTER — Ambulatory Visit: Admitting: Neurology

## 2024-03-21 ENCOUNTER — Encounter: Payer: Self-pay | Admitting: Neurology

## 2024-03-21 ENCOUNTER — Telehealth: Payer: Self-pay | Admitting: Physical Medicine and Rehabilitation

## 2024-03-21 ENCOUNTER — Other Ambulatory Visit (HOSPITAL_COMMUNITY): Payer: Self-pay

## 2024-03-21 ENCOUNTER — Other Ambulatory Visit: Payer: Self-pay | Admitting: Physical Medicine and Rehabilitation

## 2024-03-21 ENCOUNTER — Other Ambulatory Visit: Payer: Self-pay

## 2024-03-21 VITALS — BP 116/86 | HR 73 | Resp 15 | Ht 65.0 in

## 2024-03-21 DIAGNOSIS — H538 Other visual disturbances: Secondary | ICD-10-CM | POA: Diagnosis not present

## 2024-03-21 DIAGNOSIS — G8250 Quadriplegia, unspecified: Secondary | ICD-10-CM | POA: Diagnosis not present

## 2024-03-21 DIAGNOSIS — G43719 Chronic migraine without aura, intractable, without status migrainosus: Secondary | ICD-10-CM

## 2024-03-21 DIAGNOSIS — Z993 Dependence on wheelchair: Secondary | ICD-10-CM | POA: Diagnosis not present

## 2024-03-21 LAB — TYPE AND SCREEN
ABO/RH(D): O POS
Antibody Screen: NEGATIVE
Unit division: 0
Unit division: 0

## 2024-03-21 LAB — BPAM RBC
Blood Product Expiration Date: 202510252359
Blood Product Expiration Date: 202510252359
ISSUE DATE / TIME: 202509241454
ISSUE DATE / TIME: 202509261940
Unit Type and Rh: 5100
Unit Type and Rh: 5100

## 2024-03-21 MED ORDER — QULIPTA 60 MG PO TABS
60.0000 mg | ORAL_TABLET | Freq: Every day | ORAL | 11 refills | Status: DC
  Filled 2024-03-21 – 2024-03-30 (×4): qty 30, 30d supply, fill #0

## 2024-03-21 NOTE — Telephone Encounter (Signed)
 P called and asked to speak w lovorn. She also stated she needs to reschedule her appt from Friday but I believe sybil told me you still seen her. Does she need to make a follow up and if so where would you like me to place her

## 2024-03-21 NOTE — Patient Instructions (Signed)
 Vona Toribio Loving, MD  [facility] [address] [place], [place] [address]  [phone] (Work)  [phone] (Fax)
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
## 2024-03-22 ENCOUNTER — Other Ambulatory Visit: Payer: Self-pay

## 2024-03-22 ENCOUNTER — Telehealth: Payer: Self-pay | Admitting: Neurology

## 2024-03-22 ENCOUNTER — Other Ambulatory Visit (HOSPITAL_COMMUNITY): Payer: Self-pay

## 2024-03-22 ENCOUNTER — Other Ambulatory Visit: Payer: Self-pay | Admitting: Physical Medicine and Rehabilitation

## 2024-03-22 NOTE — Telephone Encounter (Signed)
 no auth required sent to GI [phone]

## 2024-03-22 NOTE — Telephone Encounter (Signed)
 Referral for Ophthalmology faxed to [facility]. Phone: [phone] [phone]

## 2024-03-22 NOTE — Telephone Encounter (Signed)
 Referral for neurosurgery fax to [facility]. Phone: [phone], Fax: [phone]

## 2024-03-23 ENCOUNTER — Other Ambulatory Visit: Payer: Self-pay | Admitting: Family Medicine

## 2024-03-23 ENCOUNTER — Other Ambulatory Visit: Payer: Self-pay

## 2024-03-23 ENCOUNTER — Encounter: Payer: Self-pay | Admitting: Family Medicine

## 2024-03-23 ENCOUNTER — Other Ambulatory Visit (HOSPITAL_COMMUNITY): Payer: Self-pay

## 2024-03-23 DIAGNOSIS — D649 Anemia, unspecified: Secondary | ICD-10-CM

## 2024-03-23 DIAGNOSIS — N390 Urinary tract infection, site not specified: Secondary | ICD-10-CM

## 2024-03-23 DIAGNOSIS — G8250 Quadriplegia, unspecified: Secondary | ICD-10-CM

## 2024-03-24 ENCOUNTER — Other Ambulatory Visit: Payer: Self-pay | Admitting: Physical Medicine and Rehabilitation

## 2024-03-24 ENCOUNTER — Other Ambulatory Visit: Payer: Self-pay

## 2024-03-24 MED ORDER — OXYBUTYNIN CHLORIDE 5 MG PO TABS
5.0000 mg | ORAL_TABLET | Freq: Three times a day (TID) | ORAL | 1 refills | Status: DC
  Filled 2024-03-24: qty 270, 90d supply, fill #0
  Filled 2024-03-28: qty 90, 30d supply, fill #0
  Filled 2024-04-20: qty 90, 30d supply, fill #1
  Filled 2024-05-15 – 2024-05-24 (×2): qty 90, 30d supply, fill #2

## 2024-03-27 ENCOUNTER — Other Ambulatory Visit: Payer: Self-pay

## 2024-03-28 ENCOUNTER — Encounter: Payer: Self-pay | Admitting: Neurology

## 2024-03-28 ENCOUNTER — Other Ambulatory Visit (HOSPITAL_COMMUNITY): Payer: Self-pay

## 2024-03-28 ENCOUNTER — Other Ambulatory Visit: Payer: Self-pay

## 2024-03-28 MED ORDER — NURTEC 75 MG PO TBDP
75.0000 mg | ORAL_TABLET | ORAL | 2 refills | Status: DC
  Filled 2024-03-28 – 2024-04-04 (×3): qty 15, 30d supply, fill #0

## 2024-03-29 ENCOUNTER — Telehealth: Payer: Self-pay

## 2024-03-29 ENCOUNTER — Other Ambulatory Visit: Payer: Self-pay

## 2024-03-29 ENCOUNTER — Other Ambulatory Visit: Payer: Self-pay | Admitting: Neurology

## 2024-03-29 ENCOUNTER — Other Ambulatory Visit (HOSPITAL_COMMUNITY): Payer: Self-pay
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
## 2024-03-29 NOTE — Telephone Encounter (Signed)
Call to patient - no answer. Unable to leave message

## 2024-03-29 NOTE — Telephone Encounter (Signed)
 LAST NOTE FROM DR. YAN STATED:   Refer her back to [facility] neurosurgical group Dr. Couture             Qulipta 60mg  daily             EEG for evaluation of her passing out episode   REFUSING REFILL

## 2024-03-30 ENCOUNTER — Encounter: Payer: Self-pay | Admitting: Family Medicine

## 2024-03-30 ENCOUNTER — Other Ambulatory Visit: Payer: Self-pay | Admitting: Physical Medicine and Rehabilitation

## 2024-03-30 ENCOUNTER — Other Ambulatory Visit: Payer: Self-pay

## 2024-03-30 ENCOUNTER — Other Ambulatory Visit (HOSPITAL_COMMUNITY): Payer: Self-pay

## 2024-03-30 ENCOUNTER — Telehealth: Payer: Self-pay | Admitting: Neurology

## 2024-03-30 MED ORDER — ALBUTEROL SULFATE HFA 108 (90 BASE) MCG/ACT IN AERS
2.0000 | INHALATION_SPRAY | Freq: Four times a day (QID) | RESPIRATORY_TRACT | 2 refills | Status: DC | PRN
  Filled 2024-03-30 – 2024-04-01 (×3): qty 18, 25d supply, fill #0

## 2024-03-30 MED ORDER — HYDROCODONE-ACETAMINOPHEN 10-325 MG PO TABS
1.0000 | ORAL_TABLET | Freq: Four times a day (QID) | ORAL | 0 refills | Status: DC | PRN
  Filled 2024-03-30 – 2024-04-30 (×3): qty 120, 30d supply, fill #0

## 2024-03-30 NOTE — Telephone Encounter (Signed)
 Please call patient.  She had called after hours on 03/28/2024, I tried calling her 3 times that evening.  Message read patient has questions about her new medication.  Would like to speak to the on-call.  It looks like it was pertaining to the [place].

## 2024-03-30 NOTE — Progress Notes (Signed)
    CHIEF COMPLAINT / HPI:  called phone number and no answer. no ability to leave message. Will try her again later   PERTINENT  PMH / PSH: I have reviewed the patient's medications, allergies, past medical and surgical history, smoking status and updated in the EMR as appropriate.   OBJECTIVE:  LMP 03/20/2024    ASSESSMENT / PLAN:   No problem-specific Assessment & Plan notes found for this encounter.  Camie Mulch MD

## 2024-03-30 NOTE — Telephone Encounter (Signed)
 Call to patient again, no answer. Left message to call office.

## 2024-03-31 ENCOUNTER — Telehealth: Payer: Self-pay

## 2024-03-31 ENCOUNTER — Encounter (HOSPITAL_COMMUNITY): Payer: Self-pay

## 2024-03-31 ENCOUNTER — Other Ambulatory Visit

## 2024-03-31 ENCOUNTER — Other Ambulatory Visit (HOSPITAL_COMMUNITY): Payer: Self-pay

## 2024-03-31 ENCOUNTER — Telehealth (HOSPITAL_COMMUNITY): Payer: Self-pay | Admitting: Pharmacy Technician

## 2024-03-31 NOTE — Telephone Encounter (Signed)
 Dr. Onita,  We are in the process of submitting a prior authorization for your patient for [facility]. We are reaching out for clinical guidance for the following information to complete the request: Plan requires documentation of FDA approved diagnosis code for the prescribed medication. Please add to patient's problem list or update patient's recent OV for future PA needs. Thanks!   Thank you! Pharmacy Team

## 2024-04-01 ENCOUNTER — Other Ambulatory Visit (HOSPITAL_COMMUNITY): Payer: Self-pay

## 2024-04-03 ENCOUNTER — Other Ambulatory Visit (HOSPITAL_COMMUNITY): Payer: Self-pay

## 2024-04-03 ENCOUNTER — Telehealth: Payer: Self-pay

## 2024-04-03 DIAGNOSIS — G43719 Chronic migraine without aura, intractable, without status migrainosus: Secondary | ICD-10-CM

## 2024-04-03 NOTE — Telephone Encounter (Signed)
 Hello Prescriber!  We are in the process of submitting a prior authorization for your patient for Nurtec. We are reaching out for clinical guidance for the following information to complete the request: Plan requires documentation of FDA approved diagnosis code for the prescribed medication. Please add to patient's problem list or update patient's recent OV for future PA needs. Thanks!   Thank you! Pharmacy Team

## 2024-04-03 NOTE — Telephone Encounter (Signed)
 Called pt 4:08 pm 04/03/24- as she asked me to.   Subscriber was not available- I left a message. Tracey Hall

## 2024-04-04 ENCOUNTER — Other Ambulatory Visit: Payer: Self-pay

## 2024-04-04 ENCOUNTER — Other Ambulatory Visit (HOSPITAL_COMMUNITY): Payer: Self-pay

## 2024-04-04 MED ORDER — NURTEC 75 MG PO TBDP
75.0000 mg | ORAL_TABLET | ORAL | Status: AC
Start: 2024-04-04 — End: ?

## 2024-04-04 NOTE — Addendum Note (Signed)
 Addended by: ONEITA NEVELYN BRAVO on: 04/04/2024 05:02 PM   Modules accepted: Orders

## 2024-04-05 ENCOUNTER — Other Ambulatory Visit (HOSPITAL_COMMUNITY): Payer: Self-pay

## 2024-04-05 NOTE — Telephone Encounter (Signed)
 Made Tracey Hall aware Dr. Onita out until 04/17/24. We have to wait for her to review/add migraine dx if appropriate.

## 2024-04-06 NOTE — Telephone Encounter (Signed)
 Spoke with [employer]. She is having decreased movement of her wrist and has CT scan tomorrow of cervical spine (neurologist following), has EEG scheduled Monday. I have been unable to get Home Health company to accept her for lab draw so she will send me message early next week about best day next week to come for lab draw. Will try to coordinate with my availability (Not Tues AM, not Thursday). Plan to draw CBC and get urine culture. She has indwelling foley so she will need assistance to get urine. Her husband will be accompanying her. Also discussed methylphenidate  which she had tried for depression sx. She said it did not seem to make a difference. I would not want to start anything new until her neurological work up including EEG (Monday), so we will circle back to this when we connect next week.

## 2024-04-07 ENCOUNTER — Ambulatory Visit
Admission: RE | Admit: 2024-04-07 | Discharge: 2024-04-07 | Disposition: A | Source: Ambulatory Visit | Attending: Neurology | Admitting: Neurology

## 2024-04-07 DIAGNOSIS — G8250 Quadriplegia, unspecified: Secondary | ICD-10-CM

## 2024-04-07 DIAGNOSIS — Z993 Dependence on wheelchair: Secondary | ICD-10-CM

## 2024-04-07 DIAGNOSIS — H538 Other visual disturbances: Secondary | ICD-10-CM

## 2024-04-10 ENCOUNTER — Other Ambulatory Visit (HOSPITAL_COMMUNITY): Payer: Self-pay

## 2024-04-10 ENCOUNTER — Ambulatory Visit: Admitting: Neurology

## 2024-04-10 DIAGNOSIS — H538 Other visual disturbances: Secondary | ICD-10-CM

## 2024-04-10 DIAGNOSIS — G8250 Quadriplegia, unspecified: Secondary | ICD-10-CM

## 2024-04-10 DIAGNOSIS — Z993 Dependence on wheelchair: Secondary | ICD-10-CM

## 2024-04-10 DIAGNOSIS — R4182 Altered mental status, unspecified: Secondary | ICD-10-CM | POA: Diagnosis not present
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
## 2024-04-12 ENCOUNTER — Other Ambulatory Visit (HOSPITAL_BASED_OUTPATIENT_CLINIC_OR_DEPARTMENT_OTHER): Payer: Self-pay

## 2024-04-13 ENCOUNTER — Other Ambulatory Visit: Payer: Self-pay | Admitting: *Deleted

## 2024-04-13 ENCOUNTER — Other Ambulatory Visit (INDEPENDENT_AMBULATORY_CARE_PROVIDER_SITE_OTHER)

## 2024-04-13 ENCOUNTER — Telehealth: Payer: Self-pay | Admitting: Family Medicine

## 2024-04-13 DIAGNOSIS — N390 Urinary tract infection, site not specified: Secondary | ICD-10-CM

## 2024-04-13 DIAGNOSIS — D649 Anemia, unspecified: Secondary | ICD-10-CM

## 2024-04-13 DIAGNOSIS — G8250 Quadriplegia, unspecified: Secondary | ICD-10-CM

## 2024-04-13 LAB — POCT HEMOGLOBIN: Hemoglobin: 6 g/dL — AB (ref 11–14.6)

## 2024-04-13 NOTE — Progress Notes (Signed)
 Patient presents to clinic this PM for POCT hemoglobin and urine culture.   Patient unable to come inside clinic due to issues with wheelchair.   Spoke with Dr. Rosalynn regarding patient. Received orders to obtain POCT hemoglobin vs CBC, given that lab technician is unable to draw labs from patient's private vehicle.   Verified patient's name and DOB.   Urine culture collected.   Robert obtained POCT hemoglobin which resulted at 6.0. Dr. Rosalynn notified at 1636. She advised that she will call patient tomorrow to discuss further management.   ED precautions discussed.   Chiquita JAYSON English, RN

## 2024-04-13 NOTE — Telephone Encounter (Signed)
 Call her re urine cx and poc hgb 6 (she is aware of results)

## 2024-04-14 ENCOUNTER — Telehealth: Payer: Self-pay | Admitting: Physical Medicine and Rehabilitation
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
## 2024-04-14 NOTE — Telephone Encounter (Signed)
 Spoke with patient  Her hgb is 6 by POC testing Discussed and she WOULD be interested in admission to short stay admission for transfusion. I will try to set that up next week and call her

## 2024-04-17 NOTE — Telephone Encounter (Signed)
 See updated recent office note and dx                  1.   Chronic migraine without aura, intractable, without status migrainosus  G43.719          Common Problems

## 2024-04-18 ENCOUNTER — Other Ambulatory Visit (HOSPITAL_COMMUNITY): Payer: Self-pay

## 2024-04-18 ENCOUNTER — Other Ambulatory Visit: Payer: Self-pay

## 2024-04-18 ENCOUNTER — Ambulatory Visit: Payer: Self-pay | Admitting: Neurology

## 2024-04-18 ENCOUNTER — Other Ambulatory Visit: Payer: Self-pay | Admitting: Family Medicine

## 2024-04-18 LAB — SPECIMEN STATUS REPORT

## 2024-04-18 LAB — URINE CULTURE

## 2024-04-19 ENCOUNTER — Other Ambulatory Visit: Payer: Self-pay | Admitting: Physical Medicine and Rehabilitation

## 2024-04-19 ENCOUNTER — Other Ambulatory Visit: Payer: Self-pay | Admitting: Family Medicine

## 2024-04-19 ENCOUNTER — Telehealth: Payer: Self-pay | Admitting: Family Medicine

## 2024-04-19 ENCOUNTER — Other Ambulatory Visit: Payer: Self-pay

## 2024-04-19 MED ORDER — CEFDINIR 300 MG PO CAPS: 300.0000 mg | ORAL_CAPSULE | Freq: Two times a day (BID) | ORAL | 0 refills | Status: DC

## 2024-04-19 NOTE — Telephone Encounter (Signed)
 Called pt- she's very scared because her strength is getting worse- she cannot flip over her hand anymore and she was balling on phone -  Have sent a message to Dr Vona to see if possible to get CT myelogram at [facility]- and see if can address while inpt??  Will call pt back when hear what can be done

## 2024-04-19 NOTE — Telephone Encounter (Signed)
 Called infusion center at [phone]. They will fax paper form for us  to fill out. Once complete, we can call back to schedule her blood transfusion.  Suzann Daring, MD  Family Medicine Teaching Service

## 2024-04-19 NOTE — Telephone Encounter (Signed)
 Infusion form received.   Placed in PCP box for completion.   Chiquita JAYSON English, RN

## 2024-04-20 ENCOUNTER — Other Ambulatory Visit: Payer: Self-pay

## 2024-04-20 ENCOUNTER — Other Ambulatory Visit (HOSPITAL_COMMUNITY): Payer: Self-pay

## 2024-04-21 ENCOUNTER — Encounter: Attending: Physical Medicine and Rehabilitation | Admitting: Physical Medicine and Rehabilitation

## 2024-04-21 ENCOUNTER — Other Ambulatory Visit (HOSPITAL_COMMUNITY): Payer: Self-pay | Admitting: Family Medicine

## 2024-04-21 ENCOUNTER — Other Ambulatory Visit: Payer: Self-pay

## 2024-04-21 ENCOUNTER — Other Ambulatory Visit (HOSPITAL_COMMUNITY): Payer: Self-pay

## 2024-04-21 DIAGNOSIS — R4189 Other symptoms and signs involving cognitive functions and awareness: Secondary | ICD-10-CM | POA: Insufficient documentation

## 2024-04-21 MED ORDER — DULOXETINE HCL 60 MG PO CPEP
60.0000 mg | ORAL_CAPSULE | Freq: Every day | ORAL | 5 refills | Status: AC
  Filled 2024-04-21: qty 30, 30d supply, fill #0
  Filled 2024-05-15 – 2024-05-24 (×2): qty 30, 30d supply, fill #1
  Filled 2024-06-26: qty 30, 30d supply, fill #2
  Filled 2024-07-06 – 2024-07-26 (×2): qty 30, 30d supply, fill #3

## 2024-04-21 MED ORDER — GABAPENTIN 600 MG PO TABS
1200.0000 mg | ORAL_TABLET | Freq: Two times a day (BID) | ORAL | 5 refills | Status: AC
  Filled 2024-04-21: qty 120, 30d supply, fill #0
  Filled 2024-05-15 – 2024-05-24 (×2): qty 120, 30d supply, fill #1
  Filled 2024-06-26: qty 120, 30d supply, fill #2
  Filled 2024-07-06 – 2024-07-26 (×2): qty 120, 30d supply, fill #3

## 2024-04-24 ENCOUNTER — Other Ambulatory Visit (HOSPITAL_COMMUNITY): Payer: Self-pay

## 2024-04-24 NOTE — Telephone Encounter (Signed)
 Received call from the infusion center in regards to order received.   She reports we need to notate on order, type + cross + transfuse 2 units of PRBC.  She reports we need to add her most recent hemoglobin on order form.   Spoke with Dr. Rosalynn. Notation and hemoglobin added.   Form placed in fax pile.   PCP is aware the infusion center is booked out until late November.

## 2024-04-24 NOTE — Telephone Encounter (Signed)
 Pharmacy Patient Advocate Encounter   Received notification from Physician's Office that prior authorization for Nurtec is required/requested.   Insurance verification completed.   The patient is insured through [facility].   Per test claim: PA required; PA submitted to above mentioned insurance via Latent Key/confirmation #/EOC [id number] Status is pending

## 2024-04-24 NOTE — Telephone Encounter (Signed)
 Hello Prescriber!  We are in the process of submitting a prior authorization for your patient for Nurtec. We are reaching out for clinical guidance for the following information to complete the request: Plan requires documentation of patient's number of headache days per month.   Thank you! Pharmacy Team

## 2024-04-25 ENCOUNTER — Ambulatory Visit: Attending: Cardiology | Admitting: Cardiology

## 2024-04-25 VITALS — BP 72/46 | HR 76

## 2024-04-25 DIAGNOSIS — I9589 Other hypotension: Secondary | ICD-10-CM | POA: Diagnosis not present

## 2024-04-25 DIAGNOSIS — R9431 Abnormal electrocardiogram [ECG] [EKG]: Secondary | ICD-10-CM | POA: Diagnosis present

## 2024-04-25 NOTE — Telephone Encounter (Signed)
 See updated note  Report daily headache with migraine features, will try CGRP antagonist.

## 2024-04-25 NOTE — Patient Instructions (Signed)
 Medication Instructions:  No changes   *If you need a refill on your cardiac medications before your next appointment, please call your pharmacy*   Lab Work: Not needed If you have labs (blood work) drawn today and your tests are completely normal, you will receive your results only by: MyChart Message (if you have MyChart) OR A paper copy in the mail If you have any lab test that is abnormal or we need to change your treatment, we will call you to review the results.   Testing/Procedures: Not needed   Follow-Up: At [facility], you and your health needs are our priority.  As part of our continuing mission to provide you with exceptional heart care, we have created designated Provider Care Teams.  These Care Teams include your primary Cardiologist (physician) and Advanced Practice Providers (APPs -  Physician Assistants and Nurse Practitioners) who all work together to provide you with the care you need, when you need it.     Your next appointment:   As needed   The format for your next appointment:   In Person  Provider:   Alm Clay, MD
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
## 2024-04-25 NOTE — Telephone Encounter (Signed)
 Clinical questions have been answered and PA submitted. PA currently Pending. Please be advised that most companies allow up to 30 days to make a decision. We will advise when a determination has been made, or follow up in 1 week.   Please reach out to our team, Rx Prior Auth Pool, if you haven't heard back in a week.

## 2024-04-26 ENCOUNTER — Telehealth: Payer: Self-pay

## 2024-04-26 ENCOUNTER — Telehealth: Payer: Self-pay | Admitting: Neurology

## 2024-04-26 ENCOUNTER — Other Ambulatory Visit (HOSPITAL_COMMUNITY): Payer: Self-pay

## 2024-04-26 ENCOUNTER — Telehealth: Payer: Self-pay | Admitting: Physical Medicine and Rehabilitation

## 2024-04-26 ENCOUNTER — Other Ambulatory Visit: Payer: Self-pay

## 2024-04-26 MED ORDER — QUETIAPINE FUMARATE 50 MG PO TABS
50.0000 mg | ORAL_TABLET | Freq: Three times a day (TID) | ORAL | 5 refills | Status: AC | PRN
  Filled 2024-04-26 (×2): qty 180, 30d supply, fill #0
  Filled 2024-05-15 – 2024-05-24 (×2): qty 180, 30d supply, fill #1
  Filled 2024-06-26: qty 180, 30d supply, fill #2
  Filled 2024-07-06: qty 180, 30d supply, fill #3

## 2024-04-26 NOTE — Telephone Encounter (Signed)
 Patient calls nurse line very tearful.  She reports she has been unable to move her hands. She reports symptoms started yesterday.   She reports she has had surgery in the past for this same issue.   She reports concerns she will not be able to feed and/or care for herself.   Patient reports she reached out to her Neurologist and is awaiting a call back.   She stated multiple times she was not going to the emergency department.   Will forward to PCP.

## 2024-04-26 NOTE — Telephone Encounter (Signed)
 RUE not working anymore And  LUE now not working anymore.   Audible snap in head today  Cannot lift water bottle- cannot feed self- cannot do this.   Pt is crying- and feels like she;s overwhelmed because she's now a real quadriplegic.   Can barely put on RUE- L wrist-  Almost passed out again last night- Cannot use LUE anymore to pull legs up.   Cannot sleep and cannot eat Going into [facility] AND [facility]-  Head pulsating  All she doe is cry.  Feels like body is traitor- because her arms aren't working.  Missed appt last week, because she couldn't get Husband off work to come to the appt.   Not using catheter-  When sediment builds up- and abdomen feels like shards of glass stabbing her.   ABX for UTI not working either-  running a fever.    Since QT prolongation has resolved, can increase Seroquel  to 500-100 mg up to 3x/day as needed for agitation, sleep and to calm her down.   They sent the police to the house- due to feeling suicidal, but was clear she cannot do anything.   Have placed a call- voicemail to Dr Couture's staff- to see what can occur to get her a CT myelogram vs MRI- can it be done at [facility]- because it's not possible at [facility]- and see what to do from there.

## 2024-04-26 NOTE — Telephone Encounter (Addendum)
 Pharmacy Patient Advocate Encounter  Received notification from [employer] that Prior Authorization for Nurtec has been DENIED.  Full denial letter will be uploaded to the media tab. See denial reason below.    PA ID# [id number]  This was submitted for preventative.

## 2024-04-26 NOTE — Telephone Encounter (Signed)
 Pt called asking to speak with RN, she reports since a Dr appointment yesterday afternoon when trying to put on seat belt she was unable to move left arm(this was around 7pm) pt still unable to move left arm this morning. Pt mention feeling like she is losing her sanity, call connected to April, RN

## 2024-04-26 NOTE — Telephone Encounter (Signed)
 Received call from patient, very anxious and crying and difficult to understand. I was able to decipher that today she can't use/move her left arm at all. Yesterday she could. She was seen by cardiology yesterday. I reviewed stroke symptoms and she denied those. I need to be put to sleep and locked away, I can' deal with this I asked patient if she had thoughts of hurting her self or anyone else and she said even if I wanted to I can't move my arms. she says that every time she tries to move, her blood pressure drops into the 70's. I advised that she needed to call 911 and go to ER for urgent evaluation for acute changes. I asked could I call a family member and she said her husband was a t work and she was alone. Multiple time I advised to call 911 and she agreed. Will send to Dr. Onita to review.

## 2024-04-26 NOTE — Telephone Encounter (Signed)
 I failed to reach patient or her significant others, agree nurse April's triage info, she needs to go to er for acute left arm weakness.

## 2024-04-26 NOTE — Telephone Encounter (Signed)
 Call to patient, no answer.No one on DPR. Sent MyChart message sent.

## 2024-04-26 NOTE — Telephone Encounter (Signed)
 Pt want to speak with you urgently

## 2024-04-27 ENCOUNTER — Other Ambulatory Visit: Payer: Self-pay

## 2024-04-28 NOTE — Telephone Encounter (Signed)
 Received returned call from infusion center.   They report that they do not have availability until 11/24. They also report concern with hemoglobin of 6 on 10/23. They are only able to transfuse with hemoglobin between 6-8. They would also need a more updated hemoglobin level.   They recommended that we reach out to [facility] long to determine if patient could receive outpatient transfusion there. I tried to call the number on the website, however, I was told that this was the incorrect number. Called operator at [facility] and was provided with the following number: [phone]. This number also did not work.   Unsure of alternate contacts or where else patient could go.   Ultimately, recommendation from infusion center was for patient to be seen in ED for blood transfusion.   Will forward to Dr. Rosalynn.   Chiquita JAYSON English, RN

## 2024-04-29 ENCOUNTER — Encounter: Payer: Self-pay | Admitting: Cardiology

## 2024-04-29 NOTE — Assessment & Plan Note (Signed)
 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  Remains on midodrine  10 mg 3 times daily.  Not many other options as I do not think Florinef  would be a great option.  Could consider Northera but would defer to neurology.  She has normal EF by previous echo so encouraged hydration to avoid orthostatic drops

## 2024-04-29 NOTE — Assessment & Plan Note (Signed)
 She has a history of having EKGs with a QTc reading up to 539 but follow-up EKGs have shown improved QTc.  On our repeat EKG here the QT interval was normal, and on my review of the most recent EKG, I also think that the measured interval is actually shorter and more than 5460 range.  Here today is roughly 430-440.  This is not consistent with prolonged QT.  Unfortunately, there is no way of knowing how adjusting medication such as Seroquel  Cymbalta  and other neurologic agents would affect her QT.

## 2024-04-30 ENCOUNTER — Other Ambulatory Visit: Payer: Self-pay | Admitting: Family Medicine

## 2024-04-30 ENCOUNTER — Other Ambulatory Visit: Payer: Self-pay | Admitting: Physical Medicine and Rehabilitation

## 2024-04-30 ENCOUNTER — Other Ambulatory Visit (HOSPITAL_COMMUNITY): Payer: Self-pay

## 2024-04-30 ENCOUNTER — Other Ambulatory Visit: Payer: Self-pay | Admitting: Neurology

## 2024-05-01 ENCOUNTER — Other Ambulatory Visit: Payer: Self-pay

## 2024-05-01 ENCOUNTER — Telehealth: Payer: Self-pay | Admitting: Family Medicine

## 2024-05-01 ENCOUNTER — Other Ambulatory Visit (HOSPITAL_COMMUNITY): Payer: Self-pay

## 2024-05-01 MED ORDER — CEFACLOR 250 MG PO CAPS: 250.0000 mg | ORAL_CAPSULE | Freq: Three times a day (TID) | ORAL | Status: DC

## 2024-05-01 MED ORDER — TOPIRAMATE 50 MG PO TABS
150.0000 mg | ORAL_TABLET | Freq: Every day | ORAL | 1 refills | Status: AC
  Filled 2024-05-01 – 2024-05-10 (×2): qty 270, 90d supply, fill #0
  Filled 2024-05-15: qty 270, 90d supply, fill #1
  Filled 2024-05-20: qty 270, 90d supply, fill #0
  Filled 2024-07-06: qty 270, 90d supply, fill #1

## 2024-05-01 MED ORDER — CITALOPRAM HYDROBROMIDE 20 MG PO TABS
20.0000 mg | ORAL_TABLET | Freq: Every day | ORAL | 1 refills | Status: AC
Start: 2024-05-01 — End: ?
  Filled 2024-05-01 – 2024-05-02 (×2): qty 30, 30d supply, fill #0
  Filled 2024-05-15 – 2024-05-24 (×2): qty 30, 30d supply, fill #1
  Filled 2024-06-26: qty 30, 30d supply, fill #2
  Filled 2024-07-26: qty 30, 30d supply, fill #3

## 2024-05-01 MED ORDER — NURTEC 75 MG PO TBDP
75.0000 mg | ORAL_TABLET | ORAL | 2 refills | Status: DC
  Filled 2024-05-01: qty 15, 30d supply, fill #0

## 2024-05-01 MED ORDER — MIDODRINE HCL 10 MG PO TABS
10.0000 mg | ORAL_TABLET | Freq: Three times a day (TID) | ORAL | 1 refills | Status: AC
  Filled 2024-05-01 – 2024-05-15 (×2): qty 270, 90d supply, fill #0
  Filled 2024-05-24: qty 90, 30d supply, fill #0
  Filled 2024-06-26: qty 90, 30d supply, fill #1
  Filled 2024-07-06 – 2024-07-26 (×2): qty 90, 30d supply, fill #2

## 2024-05-01 MED ORDER — DIAZEPAM 5 MG PO TABS
5.0000 mg | ORAL_TABLET | Freq: Two times a day (BID) | ORAL | 1 refills | Status: AC | PRN
  Filled 2024-05-01 – 2024-05-03 (×2): qty 180, 90d supply, fill #0
  Filled 2024-07-06: qty 180, 90d supply, fill #1

## 2024-05-01 MED ORDER — METHADONE HCL 5 MG PO TABS
5.0000 mg | ORAL_TABLET | Freq: Two times a day (BID) | ORAL | 0 refills | Status: AC
  Filled 2024-05-01 – 2024-05-03 (×2): qty 60, 30d supply, fill #0

## 2024-05-01 MED ORDER — TIZANIDINE HCL 4 MG PO TABS
4.0000 mg | ORAL_TABLET | Freq: Four times a day (QID) | ORAL | 1 refills | Status: AC
  Filled 2024-05-01: qty 360, fill #0
  Filled 2024-05-15: qty 360, 90d supply, fill #0
  Filled 2024-05-24: qty 120, 30d supply, fill #0
  Filled 2024-06-26: qty 120, 30d supply, fill #1
  Filled 2024-07-06 – 2024-07-26 (×2): qty 120, 30d supply, fill #2

## 2024-05-01 MED ORDER — CEFACLOR 250 MG PO CAPS: 250.0000 mg | ORAL_CAPSULE | Freq: Three times a day (TID) | ORAL | 0 refills | Status: DC

## 2024-05-01 MED ORDER — EUCRISA 2 % EX OINT
TOPICAL_OINTMENT | CUTANEOUS | 3 refills | Status: DC
  Filled 2024-05-01: qty 60, 30d supply, fill #0

## 2024-05-01 MED ORDER — ALBUTEROL SULFATE HFA 108 (90 BASE) MCG/ACT IN AERS
2.0000 | INHALATION_SPRAY | Freq: Four times a day (QID) | RESPIRATORY_TRACT | 2 refills | Status: AC | PRN
  Filled 2024-05-01: qty 6.7, 25d supply, fill #0
  Filled 2024-07-06: qty 6.7, 25d supply, fill #1

## 2024-05-01 NOTE — Telephone Encounter (Signed)
 Long discussion. She is likely needing another surgery, has appt [facility] 21st of November. 2. Infusion unit not willing to schedule her. 3. I wonder if we can coordinate her [facility] admit with transfusion? 4. UTI sx still prevalant. Wants second abx 5. Very depressed and at her wits end. No plan but does have SI.   The only thing I can think to do for her right now is try second ESBL abx.

## 2024-05-01 NOTE — Addendum Note (Signed)
 Addended byBETHA ROSALYNN CREDIT L on: 05/01/2024 03:55 PM   Modules accepted: Orders

## 2024-05-01 NOTE — Telephone Encounter (Signed)
 Spoke w patient on Friday.

## 2024-05-02 ENCOUNTER — Other Ambulatory Visit (HOSPITAL_COMMUNITY): Payer: Self-pay

## 2024-05-02 ENCOUNTER — Other Ambulatory Visit: Payer: Self-pay

## 2024-05-03 ENCOUNTER — Other Ambulatory Visit: Payer: Self-pay

## 2024-05-03 ENCOUNTER — Other Ambulatory Visit (HOSPITAL_COMMUNITY): Payer: Self-pay

## 2024-05-04 ENCOUNTER — Ambulatory Visit

## 2024-05-06 ENCOUNTER — Other Ambulatory Visit: Payer: Self-pay | Admitting: Physical Medicine and Rehabilitation

## 2024-05-08 ENCOUNTER — Other Ambulatory Visit (HOSPITAL_COMMUNITY): Payer: Self-pay

## 2024-05-09 ENCOUNTER — Other Ambulatory Visit (HOSPITAL_COMMUNITY): Payer: Self-pay

## 2024-05-09 MED ORDER — HYDROCODONE-ACETAMINOPHEN 10-325 MG PO TABS
1.0000 | ORAL_TABLET | Freq: Four times a day (QID) | ORAL | 0 refills | Status: AC | PRN
  Filled 2024-05-09: qty 120, 30d supply, fill #0

## 2024-05-10 ENCOUNTER — Other Ambulatory Visit: Payer: Self-pay

## 2024-05-10 ENCOUNTER — Other Ambulatory Visit (HOSPITAL_COMMUNITY): Payer: Self-pay

## 2024-05-11 ENCOUNTER — Other Ambulatory Visit: Payer: Self-pay | Admitting: Physical Medicine and Rehabilitation

## 2024-05-11 ENCOUNTER — Other Ambulatory Visit: Payer: Self-pay

## 2024-05-15 ENCOUNTER — Other Ambulatory Visit: Payer: Self-pay

## 2024-05-15 ENCOUNTER — Other Ambulatory Visit (HOSPITAL_COMMUNITY): Payer: Self-pay

## 2024-05-16 ENCOUNTER — Other Ambulatory Visit: Payer: Self-pay

## 2024-05-16 ENCOUNTER — Other Ambulatory Visit (HOSPITAL_COMMUNITY): Payer: Self-pay

## 2024-05-16 MED ORDER — SUMATRIPTAN SUCCINATE 100 MG PO TABS
ORAL_TABLET | ORAL | 5 refills | Status: DC
  Filled 2024-05-16: qty 18, fill #0
  Filled 2024-05-25: qty 18, 30d supply, fill #0

## 2024-05-16 MED ORDER — FLUDROCORTISONE ACETATE 0.1 MG PO TABS
0.2000 mg | ORAL_TABLET | Freq: Every day | ORAL | 5 refills | Status: DC
  Filled 2024-05-24: qty 60, 30d supply, fill #0

## 2024-05-17 ENCOUNTER — Other Ambulatory Visit: Payer: Self-pay

## 2024-05-17 ENCOUNTER — Other Ambulatory Visit (HOSPITAL_COMMUNITY): Payer: Self-pay

## 2024-05-19 ENCOUNTER — Other Ambulatory Visit (HOSPITAL_COMMUNITY): Payer: Self-pay

## 2024-05-20 ENCOUNTER — Other Ambulatory Visit (HOSPITAL_COMMUNITY): Payer: Self-pay

## 2024-05-21 ENCOUNTER — Other Ambulatory Visit (HOSPITAL_COMMUNITY): Payer: Self-pay

## 2024-05-22 ENCOUNTER — Other Ambulatory Visit: Payer: Self-pay

## 2024-05-23 ENCOUNTER — Other Ambulatory Visit: Payer: Self-pay

## 2024-05-24 ENCOUNTER — Other Ambulatory Visit: Payer: Self-pay

## 2024-05-24 ENCOUNTER — Other Ambulatory Visit (HOSPITAL_COMMUNITY): Payer: Self-pay

## 2024-05-25 ENCOUNTER — Other Ambulatory Visit (HOSPITAL_COMMUNITY): Payer: Self-pay

## 2024-05-25 ENCOUNTER — Other Ambulatory Visit: Payer: Self-pay

## 2024-05-25 ENCOUNTER — Other Ambulatory Visit: Payer: Self-pay | Admitting: Family Medicine

## 2024-05-25 MED ORDER — LIDOCAINE 5 % EX PTCH
3.0000 | MEDICATED_PATCH | CUTANEOUS | 5 refills | Status: AC
  Filled 2024-05-25: qty 90, 30d supply, fill #0
  Filled 2024-07-06: qty 90, 30d supply, fill #1

## 2024-05-26 ENCOUNTER — Other Ambulatory Visit (HOSPITAL_COMMUNITY): Payer: Self-pay

## 2024-05-26 ENCOUNTER — Other Ambulatory Visit: Payer: Self-pay

## 2024-05-30 ENCOUNTER — Encounter: Payer: Self-pay | Admitting: Physical Medicine and Rehabilitation

## 2024-06-02 ENCOUNTER — Other Ambulatory Visit (HOSPITAL_COMMUNITY): Payer: Self-pay

## 2024-06-03 ENCOUNTER — Emergency Department (HOSPITAL_COMMUNITY)

## 2024-06-03 ENCOUNTER — Inpatient Hospital Stay (HOSPITAL_COMMUNITY)
Admission: EM | Admit: 2024-06-03 | Discharge: 2024-06-06 | DRG: 193 | Disposition: A | Attending: Internal Medicine | Admitting: Internal Medicine

## 2024-06-03 DIAGNOSIS — M542 Cervicalgia: Secondary | ICD-10-CM | POA: Diagnosis present

## 2024-06-03 DIAGNOSIS — Z79899 Other long term (current) drug therapy: Secondary | ICD-10-CM | POA: Diagnosis not present

## 2024-06-03 DIAGNOSIS — J188 Other pneumonia, unspecified organism: Secondary | ICD-10-CM | POA: Diagnosis not present

## 2024-06-03 DIAGNOSIS — I5031 Acute diastolic (congestive) heart failure: Secondary | ICD-10-CM | POA: Diagnosis present

## 2024-06-03 DIAGNOSIS — D500 Iron deficiency anemia secondary to blood loss (chronic): Secondary | ICD-10-CM | POA: Diagnosis present

## 2024-06-03 DIAGNOSIS — J9601 Acute respiratory failure with hypoxia: Secondary | ICD-10-CM | POA: Diagnosis present

## 2024-06-03 DIAGNOSIS — Z7401 Bed confinement status: Secondary | ICD-10-CM | POA: Diagnosis not present

## 2024-06-03 DIAGNOSIS — L89151 Pressure ulcer of sacral region, stage 1: Secondary | ICD-10-CM | POA: Diagnosis present

## 2024-06-03 DIAGNOSIS — N390 Urinary tract infection, site not specified: Secondary | ICD-10-CM | POA: Diagnosis present

## 2024-06-03 DIAGNOSIS — Z1152 Encounter for screening for COVID-19: Secondary | ICD-10-CM | POA: Diagnosis not present

## 2024-06-03 DIAGNOSIS — G8929 Other chronic pain: Secondary | ICD-10-CM | POA: Diagnosis present

## 2024-06-03 DIAGNOSIS — J189 Pneumonia, unspecified organism: Secondary | ICD-10-CM | POA: Diagnosis present

## 2024-06-03 DIAGNOSIS — I110 Hypertensive heart disease with heart failure: Secondary | ICD-10-CM | POA: Diagnosis present

## 2024-06-03 DIAGNOSIS — Z978 Presence of other specified devices: Secondary | ICD-10-CM

## 2024-06-03 DIAGNOSIS — N319 Neuromuscular dysfunction of bladder, unspecified: Secondary | ICD-10-CM | POA: Diagnosis present

## 2024-06-03 DIAGNOSIS — F339 Major depressive disorder, recurrent, unspecified: Secondary | ICD-10-CM | POA: Diagnosis present

## 2024-06-03 DIAGNOSIS — D649 Anemia, unspecified: Secondary | ICD-10-CM | POA: Diagnosis present

## 2024-06-03 DIAGNOSIS — Z7952 Long term (current) use of systemic steroids: Secondary | ICD-10-CM | POA: Diagnosis not present

## 2024-06-03 DIAGNOSIS — R0602 Shortness of breath: Principal | ICD-10-CM

## 2024-06-03 DIAGNOSIS — F411 Generalized anxiety disorder: Secondary | ICD-10-CM | POA: Diagnosis present

## 2024-06-03 DIAGNOSIS — G8250 Quadriplegia, unspecified: Secondary | ICD-10-CM | POA: Diagnosis present

## 2024-06-03 DIAGNOSIS — G894 Chronic pain syndrome: Secondary | ICD-10-CM | POA: Diagnosis present

## 2024-06-03 DIAGNOSIS — G904 Autonomic dysreflexia: Secondary | ICD-10-CM | POA: Diagnosis present

## 2024-06-03 DIAGNOSIS — E876 Hypokalemia: Secondary | ICD-10-CM | POA: Diagnosis present

## 2024-06-03 DIAGNOSIS — R0609 Other forms of dyspnea: Secondary | ICD-10-CM | POA: Diagnosis not present

## 2024-06-03 DIAGNOSIS — Z6832 Body mass index (BMI) 32.0-32.9, adult: Secondary | ICD-10-CM | POA: Diagnosis not present

## 2024-06-03 DIAGNOSIS — Z888 Allergy status to other drugs, medicaments and biological substances status: Secondary | ICD-10-CM | POA: Diagnosis not present

## 2024-06-03 DIAGNOSIS — K5900 Constipation, unspecified: Secondary | ICD-10-CM | POA: Diagnosis present

## 2024-06-03 DIAGNOSIS — J159 Unspecified bacterial pneumonia: Secondary | ICD-10-CM | POA: Diagnosis present

## 2024-06-03 DIAGNOSIS — Z982 Presence of cerebrospinal fluid drainage device: Secondary | ICD-10-CM | POA: Diagnosis not present

## 2024-06-03 DIAGNOSIS — D696 Thrombocytopenia, unspecified: Secondary | ICD-10-CM | POA: Diagnosis present

## 2024-06-03 LAB — D-DIMER, QUANTITATIVE: D-Dimer, Quant: 1.25 ug{FEU}/mL — ABNORMAL HIGH (ref 0.00–0.50)

## 2024-06-03 LAB — CBC WITH DIFFERENTIAL/PLATELET
Abs Immature Granulocytes: 0.02 K/uL (ref 0.00–0.07)
Basophils Absolute: 0 K/uL (ref 0.0–0.1)
Basophils Relative: 0 %
Eosinophils Absolute: 0.3 K/uL (ref 0.0–0.5)
Eosinophils Relative: 4 %
HCT: 14 % — ABNORMAL LOW (ref 36.0–46.0)
Hemoglobin: 3.8 g/dL — CL (ref 12.0–15.0)
Immature Granulocytes: 0 %
Lymphocytes Relative: 23 %
Lymphs Abs: 1.6 K/uL (ref 0.7–4.0)
MCH: 19.2 pg — ABNORMAL LOW (ref 26.0–34.0)
MCHC: 27.1 g/dL — ABNORMAL LOW (ref 30.0–36.0)
MCV: 70.7 fL — ABNORMAL LOW (ref 80.0–100.0)
Monocytes Absolute: 0.4 K/uL (ref 0.1–1.0)
Monocytes Relative: 6 %
Neutro Abs: 4.6 K/uL (ref 1.7–7.7)
Neutrophils Relative %: 67 %
Platelets: 463 K/uL — ABNORMAL HIGH (ref 150–400)
RBC: 1.98 MIL/uL — ABNORMAL LOW (ref 3.87–5.11)
RDW: 19.5 % — ABNORMAL HIGH (ref 11.5–15.5)
WBC: 6.9 K/uL (ref 4.0–10.5)
nRBC: 0 % (ref 0.0–0.2)

## 2024-06-03 LAB — COMPREHENSIVE METABOLIC PANEL WITH GFR
ALT: 9 U/L (ref 0–44)
AST: 14 U/L — ABNORMAL LOW (ref 15–41)
Albumin: 3.5 g/dL (ref 3.5–5.0)
Alkaline Phosphatase: 112 U/L (ref 38–126)
Anion gap: 8 (ref 5–15)
BUN: 7 mg/dL (ref 6–20)
CO2: 27 mmol/L (ref 22–32)
Calcium: 8.3 mg/dL — ABNORMAL LOW (ref 8.9–10.3)
Chloride: 105 mmol/L (ref 98–111)
Creatinine, Ser: 0.4 mg/dL — ABNORMAL LOW (ref 0.44–1.00)
GFR, Estimated: >60 mL/min (ref >60)
Glucose, Bld: 106 mg/dL — ABNORMAL HIGH (ref 70–99)
Potassium: 3.2 mmol/L — ABNORMAL LOW (ref 3.5–5.1)
Sodium: 140 mmol/L (ref 135–145)
Total Bilirubin: <0.2 mg/dL (ref 0.0–1.2)
Total Protein: 6.8 g/dL (ref 6.5–8.1)

## 2024-06-03 LAB — RESP PANEL BY RT-PCR (RSV, FLU A&B, COVID)  RVPGX2
Influenza A by PCR: NEGATIVE
Influenza B by PCR: NEGATIVE
Resp Syncytial Virus by PCR: NEGATIVE
SARS Coronavirus 2 by RT PCR: NEGATIVE

## 2024-06-03 LAB — PREPARE RBC (CROSSMATCH)

## 2024-06-03 LAB — PRO BRAIN NATRIURETIC PEPTIDE: Pro Brain Natriuretic Peptide: 572 pg/mL — ABNORMAL HIGH (ref <300.0)

## 2024-06-03 LAB — HCG, SERUM, QUALITATIVE: Preg, Serum: NEGATIVE

## 2024-06-03 MED ORDER — DIAZEPAM 5 MG PO TABS
5.0000 mg | ORAL_TABLET | Freq: Four times a day (QID) | ORAL | Status: DC | PRN
  Administered 2024-06-03 – 2024-06-04 (×2): 5 mg via ORAL
  Filled 2024-06-03 (×2): qty 1

## 2024-06-03 MED ORDER — MORPHINE SULFATE (PF) 4 MG/ML IV SOLN
4.0000 mg | Freq: Once | INTRAVENOUS | Status: AC
  Administered 2024-06-03: 4 mg via INTRAVENOUS
  Filled 2024-06-03: qty 1

## 2024-06-03 MED ORDER — ALBUTEROL SULFATE (2.5 MG/3ML) 0.083% IN NEBU: 3.0000 mL | INHALATION_SOLUTION | Freq: Four times a day (QID) | RESPIRATORY_TRACT | Status: DC | PRN

## 2024-06-03 MED ORDER — CITALOPRAM HYDROBROMIDE 20 MG PO TABS
20.0000 mg | ORAL_TABLET | Freq: Every day | ORAL | Status: DC
  Administered 2024-06-03 – 2024-06-06 (×3): 20 mg via ORAL
  Filled 2024-06-03 (×2): qty 1
  Filled 2024-06-03 (×2): qty 2

## 2024-06-03 MED ORDER — SODIUM CHLORIDE 0.9 % IV SOLN
1.0000 g | Freq: Once | INTRAVENOUS | Status: AC
  Administered 2024-06-03: 1 g via INTRAVENOUS
  Filled 2024-06-03: qty 10

## 2024-06-03 MED ORDER — QUETIAPINE FUMARATE 50 MG PO TABS: 50.0000 mg | ORAL_TABLET | Freq: Three times a day (TID) | ORAL | Status: DC | PRN

## 2024-06-03 MED ORDER — LIDOCAINE 5 % EX PTCH
3.0000 | MEDICATED_PATCH | CUTANEOUS | Status: DC
  Administered 2024-06-03 – 2024-06-05 (×3): 3 via TRANSDERMAL
  Filled 2024-06-03 (×3): qty 3

## 2024-06-03 MED ORDER — GABAPENTIN 400 MG PO CAPS
1200.0000 mg | ORAL_CAPSULE | Freq: Two times a day (BID) | ORAL | Status: DC
  Administered 2024-06-03 – 2024-06-06 (×5): 1200 mg via ORAL
  Filled 2024-06-03 (×6): qty 3

## 2024-06-03 MED ORDER — SODIUM CHLORIDE 0.9 % IV SOLN
1.0000 g | INTRAVENOUS | Status: DC
  Administered 2024-06-04 – 2024-06-05 (×2): 1 g via INTRAVENOUS
  Filled 2024-06-03 (×2): qty 10

## 2024-06-03 MED ORDER — SODIUM CHLORIDE 0.9 % IV BOLUS
500.0000 mL | Freq: Once | INTRAVENOUS | Status: AC
  Administered 2024-06-03: 500 mL via INTRAVENOUS

## 2024-06-03 MED ORDER — ACETAMINOPHEN 650 MG RE SUPP: 650.0000 mg | Freq: Four times a day (QID) | RECTAL | Status: DC | PRN

## 2024-06-03 MED ORDER — RIMEGEPANT SULFATE 75 MG PO TBDP
75.0000 mg | ORAL_TABLET | ORAL | Status: DC
  Filled 2024-06-03: qty 1

## 2024-06-03 MED ORDER — FLUDROCORTISONE ACETATE 0.1 MG PO TABS
0.2000 mg | ORAL_TABLET | Freq: Every day | ORAL | Status: DC
  Administered 2024-06-03: 0.2 mg via ORAL
  Filled 2024-06-03: qty 2

## 2024-06-03 MED ORDER — IOHEXOL 350 MG/ML SOLN
100.0000 mL | Freq: Once | INTRAVENOUS | Status: AC | PRN
  Administered 2024-06-03: 50 mL via INTRAVENOUS

## 2024-06-03 MED ORDER — TOPIRAMATE 25 MG PO TABS
150.0000 mg | ORAL_TABLET | Freq: Every day | ORAL | Status: DC
  Administered 2024-06-03 – 2024-06-05 (×3): 150 mg via ORAL
  Filled 2024-06-03 (×3): qty 2

## 2024-06-03 MED ORDER — SODIUM CHLORIDE 0.9 % IV SOLN
100.0000 mg | Freq: Two times a day (BID) | INTRAVENOUS | Status: DC
  Administered 2024-06-03 – 2024-06-05 (×5): 100 mg via INTRAVENOUS
  Filled 2024-06-03 (×6): qty 100

## 2024-06-03 MED ORDER — ACETAMINOPHEN 325 MG PO TABS: 650.0000 mg | ORAL_TABLET | Freq: Four times a day (QID) | ORAL | Status: DC | PRN

## 2024-06-03 MED ORDER — OXYBUTYNIN CHLORIDE 5 MG PO TABS
5.0000 mg | ORAL_TABLET | Freq: Three times a day (TID) | ORAL | Status: DC
  Administered 2024-06-03 – 2024-06-06 (×6): 5 mg via ORAL
  Filled 2024-06-03 (×8): qty 1

## 2024-06-03 MED ORDER — SUMATRIPTAN SUCCINATE 50 MG PO TABS
100.0000 mg | ORAL_TABLET | ORAL | Status: DC | PRN
  Administered 2024-06-05: 18:00:00 100 mg via ORAL
  Filled 2024-06-03 (×2): qty 2

## 2024-06-03 MED ORDER — RIMEGEPANT SULFATE 75 MG PO TBDP: 75.0000 mg | ORAL_TABLET | ORAL | Status: DC

## 2024-06-03 MED ORDER — MIDODRINE HCL 5 MG PO TABS
10.0000 mg | ORAL_TABLET | Freq: Three times a day (TID) | ORAL | Status: DC
  Administered 2024-06-03: 10 mg via ORAL
  Filled 2024-06-03: qty 2

## 2024-06-03 MED ORDER — DIAZEPAM 5 MG PO TABS: 5.0000 mg | ORAL_TABLET | Freq: Two times a day (BID) | ORAL | Status: DC | PRN

## 2024-06-03 MED ORDER — METHADONE HCL 10 MG PO TABS
5.0000 mg | ORAL_TABLET | Freq: Two times a day (BID) | ORAL | Status: DC
  Administered 2024-06-03 – 2024-06-06 (×5): 5 mg via ORAL
  Filled 2024-06-03 (×7): qty 1

## 2024-06-03 MED ORDER — SODIUM CHLORIDE 0.9% IV SOLUTION: Freq: Once | INTRAVENOUS | Status: AC

## 2024-06-03 MED ORDER — HYDROCODONE-ACETAMINOPHEN 10-325 MG PO TABS
1.0000 | ORAL_TABLET | Freq: Four times a day (QID) | ORAL | Status: DC | PRN
  Administered 2024-06-04: 1 via ORAL
  Filled 2024-06-03: qty 1

## 2024-06-03 MED ORDER — PHENOL 1.4 % MT LIQD: 1.0000 | OROMUCOSAL | Status: DC | PRN

## 2024-06-03 MED ORDER — DULOXETINE HCL 60 MG PO CPEP
60.0000 mg | ORAL_CAPSULE | Freq: Every day | ORAL | Status: DC
  Administered 2024-06-03 – 2024-06-06 (×3): 60 mg via ORAL
  Filled 2024-06-03: qty 2
  Filled 2024-06-03 (×2): qty 1
  Filled 2024-06-03: qty 2

## 2024-06-03 MED ORDER — TIZANIDINE HCL 4 MG PO TABS
4.0000 mg | ORAL_TABLET | Freq: Four times a day (QID) | ORAL | Status: DC
  Administered 2024-06-03 – 2024-06-06 (×9): 4 mg via ORAL
  Filled 2024-06-03 (×11): qty 1

## 2024-06-03 NOTE — ED Notes (Signed)
 2 LPM of O2 was administered via [place] due to the pt's Spo2 reading 90-92% on RA
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
## 2024-06-03 NOTE — ED Notes (Signed)
 Pt continues to take [place] off after ED staff told her to keep it on.

## 2024-06-03 NOTE — ED Triage Notes (Signed)
 Patient bib EMS Patient developed shobr yesterday Distended abd today Sat mid 80's Went to 94 with 2 liters  [place] Patient reports neck and spinal cord pain Patient beridden at baseline Paraplegic Exipratory wheezing 5 albuterol  and 0.5 atrovent en route  Lungs cleared   Patient o2 sat in triage on room air is 95-96%

## 2024-06-03 NOTE — ED Notes (Signed)
 Patient says she feels weak and dehydrated

## 2024-06-03 NOTE — ED Notes (Signed)
 ED staff applied a simple mask set to 3 LPM so that the pt would keep it on
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
## 2024-06-04 ENCOUNTER — Inpatient Hospital Stay (HOSPITAL_COMMUNITY)

## 2024-06-04 ENCOUNTER — Other Ambulatory Visit: Payer: Self-pay

## 2024-06-04 ENCOUNTER — Encounter (HOSPITAL_COMMUNITY): Payer: Self-pay | Admitting: Internal Medicine

## 2024-06-04 LAB — COMPREHENSIVE METABOLIC PANEL WITH GFR
ALT: 11 U/L (ref 0–44)
AST: 19 U/L (ref 15–41)
Albumin: 3.9 g/dL (ref 3.5–5.0)
Alkaline Phosphatase: 134 U/L — ABNORMAL HIGH (ref 38–126)
Anion gap: 11 (ref 5–15)
BUN: 7 mg/dL (ref 6–20)
CO2: 26 mmol/L (ref 22–32)
Calcium: 8.6 mg/dL — ABNORMAL LOW (ref 8.9–10.3)
Chloride: 100 mmol/L (ref 98–111)
Creatinine, Ser: 0.47 mg/dL (ref 0.44–1.00)
GFR, Estimated: >60 mL/min (ref >60)
Glucose, Bld: 105 mg/dL — ABNORMAL HIGH (ref 70–99)
Potassium: 3.3 mmol/L — ABNORMAL LOW (ref 3.5–5.1)
Sodium: 137 mmol/L (ref 135–145)
Total Bilirubin: 0.6 mg/dL (ref 0.0–1.2)
Total Protein: 7.9 g/dL (ref 6.5–8.1)

## 2024-06-04 LAB — ECHOCARDIOGRAM COMPLETE
Area-P 1/2: 4.89 cm2
Height: 66 in
S' Lateral: 3.4 cm
Weight: 3199.99 [oz_av]

## 2024-06-04 LAB — CBC
HCT: 28 % — ABNORMAL LOW (ref 36.0–46.0)
Hemoglobin: 8.5 g/dL — ABNORMAL LOW (ref 12.0–15.0)
MCH: 23 pg — ABNORMAL LOW (ref 26.0–34.0)
MCHC: 30.4 g/dL (ref 30.0–36.0)
MCV: 75.7 fL — ABNORMAL LOW (ref 80.0–100.0)
Platelets: 556 K/uL — ABNORMAL HIGH (ref 150–400)
RBC: 3.7 MIL/uL — ABNORMAL LOW (ref 3.87–5.11)
RDW: 19.7 % — ABNORMAL HIGH (ref 11.5–15.5)
WBC: 13.9 K/uL — ABNORMAL HIGH (ref 4.0–10.5)
nRBC: 0 % (ref 0.0–0.2)

## 2024-06-04 MED ORDER — RIMEGEPANT SULFATE 75 MG PO TBDP: 75.0000 mg | ORAL_TABLET | ORAL | Status: DC

## 2024-06-04 MED ORDER — HYDROCODONE-ACETAMINOPHEN 10-325 MG PO TABS
1.0000 | ORAL_TABLET | ORAL | Status: DC | PRN
  Administered 2024-06-04 (×2): 2 via ORAL
  Filled 2024-06-04 (×2): qty 2

## 2024-06-04 MED ORDER — MIDODRINE HCL 5 MG PO TABS
5.0000 mg | ORAL_TABLET | Freq: Two times a day (BID) | ORAL | Status: DC
  Administered 2024-06-04 (×2): 5 mg via ORAL
  Filled 2024-06-04 (×2): qty 1

## 2024-06-04 MED ORDER — FUROSEMIDE 10 MG/ML IJ SOLN
60.0000 mg | Freq: Two times a day (BID) | INTRAMUSCULAR | Status: AC
  Administered 2024-06-04 (×2): 60 mg via INTRAVENOUS
  Filled 2024-06-04: qty 6
  Filled 2024-06-04: qty 8

## 2024-06-04 MED ORDER — POLYETHYLENE GLYCOL 3350 17 G PO PACK
17.0000 g | PACK | Freq: Two times a day (BID) | ORAL | Status: DC
  Administered 2024-06-04: 17 g via ORAL
  Filled 2024-06-04 (×2): qty 1

## 2024-06-04 MED ORDER — CHLORHEXIDINE GLUCONATE CLOTH 2 % EX PADS
6.0000 | MEDICATED_PAD | Freq: Every day | CUTANEOUS | Status: DC
  Administered 2024-06-04 – 2024-06-06 (×2): 6 via TOPICAL

## 2024-06-04 MED ORDER — POTASSIUM CHLORIDE CRYS ER 20 MEQ PO TBCR
40.0000 meq | EXTENDED_RELEASE_TABLET | Freq: Two times a day (BID) | ORAL | Status: AC
  Administered 2024-06-04 (×2): 40 meq via ORAL
  Filled 2024-06-04 (×2): qty 2

## 2024-06-04 MED ORDER — FUROSEMIDE 10 MG/ML IJ SOLN
40.0000 mg | Freq: Once | INTRAMUSCULAR | Status: AC
  Administered 2024-06-04: 40 mg via INTRAVENOUS
  Filled 2024-06-04: qty 4

## 2024-06-04 MED ORDER — SMOG ENEMA
960.0000 mL | Freq: Once | RECTAL | Status: AC
  Administered 2024-06-04: 960 mL via RECTAL
  Filled 2024-06-04: qty 960

## 2024-06-04 MED ORDER — DOCUSATE SODIUM 283 MG RE ENEM
1.0000 | ENEMA | Freq: Every day | RECTAL | Status: DC
  Filled 2024-06-04: qty 1

## 2024-06-04 NOTE — ED Notes (Signed)
 Patient tearful and complaining of increasing SHOB.  RN to room to assess patient and patient appears anxiety with increased work of breathing.  Increased HR, RR, and BP noted.  ED MD asked to come see patient due to patient's current condition.  Patient appears to be in acute respiratory distress.  Repositioned in bed without improvement.  Simple mask at 3 L oxygen  placed.

## 2024-06-04 NOTE — Progress Notes (Signed)
° °  Brief Progress Note   _____________________________________________________________________________________________________________  Patient Name: Tracey Hall Patient DOB: January 02, 1981 Date: @TODAY @      Data: Reviewed vital signs, labs, and clinical notes.    Action: No action required at this time.    Response:  Bed assigned.  _____________________________________________________________________________________________________________  [facility] RN Expeditor Khary Schaben [address] [place] Please contact us  directly via secure chat (search for [facility]) or by calling us  at [phone] [facility]).

## 2024-06-04 NOTE — ED Notes (Signed)
 Patient scheduled to be transferred to [facility]. Patient refused and provider contacted. Provider spoke with patient and [name] transfer canceled and patient will now be admitted to [facility] instead.

## 2024-06-04 NOTE — Progress Notes (Signed)
 SMOG enema administered. Patient tolerated well and produced hard, medium sized bowel movement.

## 2024-06-04 NOTE — Plan of Care (Signed)
°  Problem: Health Behavior/Discharge Planning: Goal: Ability to manage health-related needs will improve Outcome: Progressing   Problem: Clinical Measurements: Goal: Diagnostic test results will improve Outcome: Progressing Goal: Cardiovascular complication will be avoided Outcome: Progressing   Problem: Nutrition: Goal: Adequate nutrition will be maintained Outcome: Progressing   Problem: Elimination: Goal: Will not experience complications related to bowel motility Outcome: Progressing Goal: Will not experience complications related to urinary retention Outcome: Progressing   Problem: Pain Managment: Goal: General experience of comfort will improve and/or be controlled Outcome: Progressing   Problem: Safety: Goal: Ability to remain free from injury will improve Outcome: Progressing   Problem: Skin Integrity: Goal: Risk for impaired skin integrity will decrease Outcome: Progressing

## 2024-06-04 NOTE — Progress Notes (Signed)
 Checked with ER, Adelita Armour, RN  and Colonel Moose, RN reported that patient went into fluid overload and four unit of RBC's was ordered to be held by Erle Odell Castor, MD.

## 2024-06-04 NOTE — ED Provider Notes (Signed)
°  [place] EMERGENCY DEPARTMENT AT [facility] Provider Note  Patient admitted during prior shift, boarding in the ED while awaiting available inpatient bed.  Patient was initially seen for shortness of breath and abdominal distention, found to have new 2 L oxygen  requirement and significant anemia to hemoglobin of 3.8.  Patient was admitted to Dr. Sim service overnight.  Notified by nursing that patient had worsening shortness of breath.  I assessed the patient, patient was saturating in the 80s on nasal cannula, placed on nonrebreather with improvement of saturations to the high 90s.  Patient does have some rales on lungs.  Patient has received 3 units PRBCs with associated fluids, has not received any diuretics, I do have some concern for transfusion associated circulatory overload, therefore I ordered 40 mg IV Lasix , nursing team to contact inpatient provider for further guidance.   Rogelia Jerilynn RAMAN, MD 06/04/24 [phone]
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
## 2024-06-04 NOTE — ED Notes (Signed)
 Patient requesting to wait for labs work to result and to speak to MD prior to 4th unit of blood

## 2024-06-04 NOTE — ED Notes (Signed)
 Patient appears less anxious.  RR currently 23.  Respiration appear even and currently unlabored.  Patient able to speak in full sentences.  States she feels better.

## 2024-06-05 ENCOUNTER — Inpatient Hospital Stay (HOSPITAL_COMMUNITY)

## 2024-06-05 LAB — BASIC METABOLIC PANEL WITH GFR
Anion gap: 10 (ref 5–15)
BUN: 9 mg/dL (ref 6–20)
CO2: 24 mmol/L (ref 22–32)
Calcium: 8.3 mg/dL — ABNORMAL LOW (ref 8.9–10.3)
Chloride: 101 mmol/L (ref 98–111)
Creatinine, Ser: 0.48 mg/dL (ref 0.44–1.00)
GFR, Estimated: >60 mL/min (ref >60)
Glucose, Bld: 111 mg/dL — ABNORMAL HIGH (ref 70–99)
Potassium: 3.6 mmol/L (ref 3.5–5.1)
Sodium: 135 mmol/L (ref 135–145)

## 2024-06-05 LAB — CBC
HCT: 27.1 % — ABNORMAL LOW (ref 36.0–46.0)
Hemoglobin: 8.4 g/dL — ABNORMAL LOW (ref 12.0–15.0)
MCH: 23.5 pg — ABNORMAL LOW (ref 26.0–34.0)
MCHC: 31 g/dL (ref 30.0–36.0)
MCV: 75.9 fL — ABNORMAL LOW (ref 80.0–100.0)
Platelets: 533 K/uL — ABNORMAL HIGH (ref 150–400)
RBC: 3.57 MIL/uL — ABNORMAL LOW (ref 3.87–5.11)
RDW: 20.2 % — ABNORMAL HIGH (ref 11.5–15.5)
WBC: 13.1 K/uL — ABNORMAL HIGH (ref 4.0–10.5)
nRBC: 0.2 % (ref 0.0–0.2)

## 2024-06-05 MED ORDER — FUROSEMIDE 10 MG/ML IJ SOLN
40.0000 mg | Freq: Two times a day (BID) | INTRAMUSCULAR | Status: AC
  Administered 2024-06-05 (×2): 40 mg via INTRAVENOUS
  Filled 2024-06-05 (×2): qty 4

## 2024-06-05 MED ORDER — ONDANSETRON HCL 4 MG/2ML IJ SOLN
4.0000 mg | Freq: Four times a day (QID) | INTRAMUSCULAR | Status: DC | PRN
  Administered 2024-06-05: 12:00:00 4 mg via INTRAVENOUS
  Filled 2024-06-05: qty 2

## 2024-06-05 MED ORDER — MORPHINE SULFATE (PF) 4 MG/ML IV SOLN
4.0000 mg | INTRAVENOUS | Status: DC | PRN
  Administered 2024-06-05 – 2024-06-06 (×4): 4 mg via INTRAVENOUS
  Filled 2024-06-05 (×4): qty 1

## 2024-06-05 MED ORDER — FERROUS SULFATE 325 (65 FE) MG PO TABS
325.0000 mg | ORAL_TABLET | Freq: Every day | ORAL | Status: DC
  Filled 2024-06-05: qty 1

## 2024-06-05 MED ORDER — HYDROCODONE-ACETAMINOPHEN 10-325 MG PO TABS
2.0000 | ORAL_TABLET | ORAL | Status: DC | PRN
  Administered 2024-06-05: 12:00:00 2 via ORAL
  Filled 2024-06-05: qty 2

## 2024-06-05 MED ORDER — FUROSEMIDE 10 MG/ML IJ SOLN: 60.0000 mg | Freq: Two times a day (BID) | INTRAMUSCULAR | Status: DC

## 2024-06-05 MED ORDER — PANTOPRAZOLE SODIUM 40 MG IV SOLR
40.0000 mg | Freq: Two times a day (BID) | INTRAVENOUS | Status: DC
  Administered 2024-06-05 – 2024-06-06 (×3): 40 mg via INTRAVENOUS
  Filled 2024-06-05 (×3): qty 10

## 2024-06-05 MED ORDER — SENNOSIDES-DOCUSATE SODIUM 8.6-50 MG PO TABS
1.0000 | ORAL_TABLET | Freq: Two times a day (BID) | ORAL | Status: DC
  Administered 2024-06-05 – 2024-06-06 (×3): 1 via ORAL
  Filled 2024-06-05 (×3): qty 1

## 2024-06-05 MED ORDER — HYDROCODONE-ACETAMINOPHEN 10-325 MG PO TABS
2.0000 | ORAL_TABLET | ORAL | Status: DC | PRN
  Administered 2024-06-05: 21:00:00 2 via ORAL
  Filled 2024-06-05: qty 2

## 2024-06-05 MED ORDER — SMOG ENEMA
960.0000 mL | Freq: Once | RECTAL | Status: DC
  Filled 2024-06-05: qty 960

## 2024-06-05 MED ORDER — POTASSIUM CHLORIDE CRYS ER 20 MEQ PO TBCR
40.0000 meq | EXTENDED_RELEASE_TABLET | Freq: Two times a day (BID) | ORAL | Status: AC
  Administered 2024-06-05: 21:00:00 40 meq via ORAL
  Filled 2024-06-05 (×2): qty 2

## 2024-06-05 NOTE — Progress Notes (Signed)
 Heart Failure Navigator Progress Note  Assessed for Heart & Vascular TOC clinic readiness.  Patient does not meet criteria due to EF 60-65%, per MD note patient is bedridden for the last 20 years. No HF TOC. .   Navigator will sign off at this time.   Stephane Haddock, BSN, Scientist, Clinical (histocompatibility And Immunogenetics) Only

## 2024-06-05 NOTE — Progress Notes (Addendum)
 This nurse went to patient's two times with her morning oral medications, Patient refused to wake up to take her medications, stating she does not want to be disturbed from her sleep. She also has a SMOG enema and she states she is not ready for that either. Will continue to follow for the enema . Dr Celinda made aware

## 2024-06-05 NOTE — Plan of Care (Signed)
°  Problem: Clinical Measurements: Goal: Ability to maintain clinical measurements within normal limits will improve Outcome: Not Progressing Goal: Will remain free from infection Outcome: Not Progressing Goal: Diagnostic test results will improve Outcome: Not Progressing Goal: Respiratory complications will improve Outcome: Not Progressing Goal: Cardiovascular complication will be avoided Outcome: Not Progressing   Problem: Activity: Goal: Risk for activity intolerance will decrease Outcome: Not Progressing   Problem: Nutrition: Goal: Adequate nutrition will be maintained Outcome: Not Progressing   Problem: Coping: Goal: Level of anxiety will decrease Outcome: Not Progressing   Problem: Elimination: Goal: Will not experience complications related to bowel motility Outcome: Not Progressing Goal: Will not experience complications related to urinary retention Outcome: Not Progressing   Problem: Safety: Goal: Ability to remain free from injury will improve Outcome: Not Progressing   Problem: Skin Integrity: Goal: Risk for impaired skin integrity will decrease Outcome: Not Progressing
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
## 2024-06-05 NOTE — Plan of Care (Signed)

## 2024-06-06 LAB — BASIC METABOLIC PANEL WITH GFR
Anion gap: 12 (ref 5–15)
BUN: 12 mg/dL (ref 6–20)
CO2: 21 mmol/L — ABNORMAL LOW (ref 22–32)
Calcium: 8.9 mg/dL (ref 8.9–10.3)
Chloride: 102 mmol/L (ref 98–111)
Creatinine, Ser: 0.6 mg/dL (ref 0.44–1.00)
GFR, Estimated: >60 mL/min (ref >60)
Glucose, Bld: 100 mg/dL — ABNORMAL HIGH (ref 70–99)
Potassium: 3.9 mmol/L (ref 3.5–5.1)
Sodium: 135 mmol/L (ref 135–145)

## 2024-06-06 LAB — HIV ANTIBODY (ROUTINE TESTING W REFLEX): HIV Screen 4th Generation wRfx: NONREACTIVE

## 2024-06-06 MED ORDER — MIDODRINE HCL 5 MG PO TABS
10.0000 mg | ORAL_TABLET | Freq: Three times a day (TID) | ORAL | Status: DC
  Administered 2024-06-06 (×2): 10 mg via ORAL
  Filled 2024-06-06 (×2): qty 2

## 2024-06-06 MED ORDER — SENNOSIDES-DOCUSATE SODIUM 8.6-50 MG PO TABS: 1.0000 | ORAL_TABLET | Freq: Every evening | ORAL | 0 refills | Status: AC | PRN

## 2024-06-06 MED ORDER — IRON SUCROSE 200 MG IVPB - SIMPLE MED
200.0000 mg | Freq: Once | Status: AC
  Administered 2024-06-06: 11:00:00 200 mg via INTRAVENOUS
  Filled 2024-06-06: qty 200

## 2024-06-06 MED ORDER — DOXYCYCLINE HYCLATE 100 MG PO TABS: 100.0000 mg | ORAL_TABLET | Freq: Two times a day (BID) | ORAL | 0 refills | Status: AC

## 2024-06-06 MED ORDER — AMOXICILLIN-POT CLAVULANATE 875-125 MG PO TABS
1.0000 | ORAL_TABLET | Freq: Two times a day (BID) | ORAL | Status: DC
  Administered 2024-06-06: 10:00:00 1 via ORAL
  Filled 2024-06-06: qty 1

## 2024-06-06 MED ORDER — AMOXICILLIN-POT CLAVULANATE 875-125 MG PO TABS: 1.0000 | ORAL_TABLET | Freq: Two times a day (BID) | ORAL | 0 refills | Status: AC

## 2024-06-06 MED ORDER — MIDODRINE HCL 5 MG PO TABS: 5.0000 mg | ORAL_TABLET | Freq: Three times a day (TID) | ORAL | Status: DC

## 2024-06-06 MED ORDER — DOCUSATE SODIUM 283 MG RE ENEM: 1.0000 | ENEMA | Freq: Every day | RECTAL | 0 refills | Status: AC

## 2024-06-06 NOTE — Plan of Care (Signed)

## 2024-06-06 NOTE — Discharge Summary (Signed)
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 (meaning this test can be used) for the duration of the COVID-19 declaration under Section 564(b)(1) of the Act, 21 U.S.C. section 360bbb-3(b)(1), unless the authorization is terminated or revoked.  Performed at [facility], [address] [place], [place] [address]      Time coordinating discharge: Over 35 minutes  SIGNED:   Erle Odell Castor, MD  [facility] 06/06/2024, 8:25 AM Pager   If 7PM-7AM, please contact  night-coverage [email or web address] Password [email or web address]     [1]  Allergies Allergen Reactions   Cortisone Rash    Severe rash    Hydrocortisone  Itching and Rash   Latex Itching, Swelling and Rash

## 2024-06-06 NOTE — Progress Notes (Signed)
°   06/06/24 1005  TOC Brief Assessment  Insurance and Status Reviewed  Patient has primary care physician Yes Linton, Camie CROME, MD)  Home environment has been reviewed Home  Prior level of function: Quadraplegic  Prior/Current Home Services No current home services  Social Drivers of Health Review SDOH reviewed no interventions necessary  Readmission risk has been reviewed Yes  Transition of care needs no transition of care needs at this time

## 2024-06-07 ENCOUNTER — Telehealth: Payer: Self-pay

## 2024-06-07 ENCOUNTER — Telehealth: Payer: Self-pay | Admitting: *Deleted

## 2024-06-07 LAB — TYPE AND SCREEN
ABO/RH(D): O POS
Antibody Screen: NEGATIVE
Unit division: 0
Unit division: 0
Unit division: 0
Unit division: 0

## 2024-06-07 LAB — BPAM RBC
Blood Product Expiration Date: 202601102359
Blood Product Expiration Date: 202601102359
Blood Product Expiration Date: 202601102359
Blood Product Expiration Date: 202601102359
ISSUE DATE / TIME: 202512132019
ISSUE DATE / TIME: 202512132241
ISSUE DATE / TIME: 202512140058
Unit Type and Rh: 5100
Unit Type and Rh: 5100
Unit Type and Rh: 5100
Unit Type and Rh: 5100

## 2024-06-07 NOTE — Transitions of Care (Post Inpatient/ED Visit) (Unsigned)
° °  06/07/2024  Name: Tracey Hall MRN: [id number] DOB: 1980-10-17  Today's TOC FU Call Status: Today's TOC FU Call Status:: Unsuccessful Call (1st Attempt) Unsuccessful Call (1st Attempt) Date: 06/07/24  Attempted to reach the patient regarding the most recent Inpatient/ED visit.  Follow Up Plan: Additional outreach attempts will be made to reach the patient to complete the Transitions of Care (Post Inpatient/ED visit) call.   Signature Julian Lemmings, LPN [facility] Nurse Health Advisor Direct Dial [phone]

## 2024-06-07 NOTE — Procedures (Signed)
° °  HISTORY:  43 year old female presented with worsening headaches  TECHNIQUE:  This is a routine 16 channel EEG recording with one channel devoted to a limited EKG recording.  It was performed during wakefulness, drowsiness and asleep.  Hyperventilation and photic stimulation were performed as activating procedures.  There are minimum muscle and movement artifact noted.  Upon maximum arousal, posterior dominant waking rhythm consistent of rhythmic alpha range activity. Activities are symmetric over the bilateral posterior derivations and attenuated with eye opening.  Photic stimulation did not alter the tracing.  Hyperventilation produced mild/moderate buildup with higher amplitude and the slower activities noted.  During EEG recording, patient developed drowsiness and no deeper stage of sleep was achieved.  During EEG recording, there was no epileptiform discharge noted.  EKG demonstrate normal sinus rhythm.  CONCLUSION: This is a  normal awake EEG.  There is no electrodiagnostic evidence of epileptiform discharge.  Ulmer Degen, M.D. Ph.D.  [facility] [address] [place], [place] [address] Phone: [phone] Fax:      [phone]

## 2024-06-08 NOTE — Telephone Encounter (Signed)
EEG was normal.

## 2024-06-08 NOTE — Transitions of Care (Post Inpatient/ED Visit) (Signed)
 06/08/2024  Name: Tracey Hall MRN: [id number] DOB: 01-06-1981  Today's TOC FU Call Status: Today's TOC FU Call Status:: Successful TOC FU Call Completed Unsuccessful Call (1st Attempt) Date: 06/07/24 [facility] FU Call Complete Date: 06/08/24  Patient's Name and Date of Birth confirmed. Name, DOB  Transition Care Management Follow-up Telephone Call Date of Discharge: 06/06/24 Discharge Facility: [facility]) Type of Discharge: Inpatient Admission Primary Inpatient Discharge Diagnosis:: pneumonia How have you been since you were released from the hospital?: Same Any questions or concerns?: No  Items Reviewed: Did you receive and understand the discharge instructions provided?: Yes Medications obtained,verified, and reconciled?: Yes (Medications Reviewed) Any new allergies since your discharge?: No Dietary orders reviewed?: Yes Do you have support at home?: Yes People in Home [RPT]: spouse  Medications Reviewed Today: Medications Reviewed Today     Reviewed by Emmitt Pan, LPN (Licensed Practical Nurse) on 06/08/24 at 1423  Med List Status: <None>   Medication Order Taking? Sig Documenting Provider Last Dose Status Informant  albuterol  (VENTOLIN  HFA) 108 (90 Base) MCG/ACT inhaler [id number] Yes Inhale 2 puffs into the lungs every 6 (six) hours as needed for wheezing or shortness of breath. Rosalynn Camie CROME, MD  Active Spouse/Significant Other, Self, Pharmacy Records  amoxicillin -clavulanate (AUGMENTIN ) 875-125 MG tablet [id number] Yes Take 1 tablet by mouth every 12 (twelve) hours for 2 days. Odell Celinda Balo, MD  Active   citalopram  (CELEXA ) 20 MG tablet [id number] Yes Take 1 tablet (20 mg total) by mouth daily. Lovorn, Megan, MD  Active Spouse/Significant Other, Self, Pharmacy Records  diazepam  (VALIUM ) 5 MG tablet [id number] Yes Take 1 tablet (5 mg total) by mouth every 12 (twelve) hours as needed for muscle spasms. Lovorn, Megan, MD  Active Spouse/Significant Other, Self,  Pharmacy Records  docusate sodium  [facility]) 283 MG enema [id number] Yes Place 1 enema (283 mg total) rectally daily. Odell Celinda Balo, MD  Active   doxycycline  (VIBRA -TABS) 100 MG tablet [id number] Yes Take 1 tablet (100 mg total) by mouth 2 (two) times daily for 2 days. Odell Celinda Balo, MD  Active   DULoxetine  (CYMBALTA ) 60 MG capsule [id number] Yes Take 1 capsule (60 mg total) by mouth daily. FOR NEUROPATHIC PAIN- SPINAL CORD INJURY Lovorn, Megan, MD  Active Spouse/Significant Other, Self, Pharmacy Records  gabapentin  (NEURONTIN ) 600 MG tablet [id number] Yes Take 2 tablets (1,200 mg total) by mouth 2 (two) times daily for neuropathic pain. Lovorn, Megan, MD  Active Spouse/Significant Other, Self, Pharmacy Records  HYDROcodone -acetaminophen  [facility]) 10-325 MG tablet [id number] Yes Take 1 tablet by mouth every 6 (six) hours as needed. For chronic neck pain- along with methadone  Lovorn, Megan, MD  Active Spouse/Significant Other, Self, Pharmacy Records  lidocaine  (LIDODERM ) 5 % [id number] Yes Place 3 patches onto the skin daily. Remove & Discard patch within 12 hours or as directed by MD due to supersensitivity of skin-  since syrinx surgery with neuropathic pain Rosalynn Camie CROME, MD  Active Spouse/Significant Other, Self, Pharmacy Records  methadone  (DOLOPHINE ) 5 MG tablet [id number] Yes Take 1 tablet (5 mg total) by mouth every 12 (twelve) hours. For nerve and chronic pain. G89.2- chronic pain due to her Spinal cord injury Lovorn, Megan, MD  Active Spouse/Significant Other, Self, Pharmacy Records  midodrine  (PROAMATINE ) 10 MG tablet [id number] Yes Take 1 tablet (10 mg total) by mouth 3 (three) times daily. Lovorn, Megan, MD  Active Spouse/Significant Other, Self, Pharmacy Records  NURTEC 75 MG TBDP [id number] Yes Dissolve 1 tablet (75 mg total) by mouth every  other day. Onita Duos, MD  Active Spouse/Significant Other, Self, Pharmacy Records  QUEtiapine  (SEROQUEL ) 50 MG tablet [id number] Yes Take 1-2 tablets  (50-100 mg total) by mouth 3 (three) times daily as needed. Lovorn, Megan, MD  Active Spouse/Significant Other, Self, Pharmacy Records  senna-docusate (SENOKOT-S) 8.6-50 MG tablet [id number] Yes Take 1 tablet by mouth at bedtime as needed for mild constipation. Odell Celinda Balo, MD  Active     Discontinued 09/07/23 1652 (Entry Error)            Med Note>> Signa Norlene BRAVO, [facility]   09/07/2023  4:52 PM medication entered in error    tacrolimus  (PROTOPIC ) 0.03 % ointment [id number] Yes Apply 1 Application topically 2 (two) times daily. [provider]  Active Self, Spouse/Significant Other, Pharmacy Records  tiZANidine  (ZANAFLEX ) 4 MG tablet [id number] Yes Take 1 tablet (4 mg total) by mouth 4 (four) times daily. Lovorn, Megan, MD  Active Spouse/Significant Other, Self, Pharmacy Records  topiramate  (TOPAMAX ) 50 MG tablet [id number] Yes Take 3 tablets (150 mg total) by mouth at bedtime. Lovorn, Megan, MD  Active Spouse/Significant Other, Self, Pharmacy Records            Home Care and Equipment/Supplies: Were Home Health Services Ordered?: NA Any new equipment or medical supplies ordered?: NA  Functional Questionnaire: Do you need assistance with bathing/showering or dressing?: No Do you need assistance with meal preparation?: No Do you need assistance with eating?: No Do you have difficulty maintaining continence: No Do you need assistance with getting out of bed/getting out of a chair/moving?: No Do you have difficulty managing or taking your medications?: No  Follow up appointments reviewed: PCP Follow-up appointment confirmed?: No (no avail appts, sent message to staff to schedule) MD Provider Line [phone] Given: No Specialist Hospital Follow-up appointment confirmed?: NA Do you need transportation to your follow-up appointment?: No Do you understand care options if your condition(s) worsen?: Yes-patient verbalized understanding    SIGNATURE Julian Lemmings,  LPN [facility] Nurse Health Advisor Direct Dial [phone]

## 2024-06-21 ENCOUNTER — Other Ambulatory Visit (HOSPITAL_COMMUNITY): Payer: Self-pay

## 2024-06-26 ENCOUNTER — Other Ambulatory Visit: Payer: Self-pay

## 2024-06-26 ENCOUNTER — Telehealth: Payer: Self-pay

## 2024-06-26 DIAGNOSIS — G8250 Quadriplegia, unspecified: Secondary | ICD-10-CM

## 2024-06-26 NOTE — Telephone Encounter (Signed)
 Patient Tracey Hall on nurse line requesting returned call from nursing staff or Dr. Rosalynn.   Returned call to patient.   She feels terrible. Felt that she was not ready to leave the hospital. Feels that head is like a thunderstorm since Friday. She has been having bad headaches and visual issues for the last several years. Feels that headaches and visual issues continues to get worse and nobody has any answers.   She had visit with Dr. Vona on 05/25/24. Supposed to have follow up with Dr. Vona however, she would like second opinion.He also referred her to Ophthalmology. She has an eye doctor appointment scheduled on 07/20/24.  She is wanting a second opinion Neurosurgery referral to either [facility] or [place].   She is also requesting returned call from Dr. Rosalynn as soon as she is available.   Chiquita JAYSON English, RN

## 2024-06-27 ENCOUNTER — Other Ambulatory Visit: Payer: Self-pay

## 2024-06-30 ENCOUNTER — Telehealth: Payer: Self-pay | Admitting: Physical Medicine and Rehabilitation

## 2024-06-30 ENCOUNTER — Encounter: Payer: Self-pay | Admitting: Family Medicine

## 2024-06-30 NOTE — Telephone Encounter (Signed)
 Pt's last visit actually seen was 03/17/24- when she was unresponsive and passed out, requiring hospitalization.  I have not actually spoken to her, and seen her in visit since 5/25-  She needs to see Uenice before I will refill any more pain meds. I know she's hurting and needs them, but she needs an appt with Fidela ASAP and me on the books - and ot be seen before the end of her current Rx, before I will refill any more pain meds.   Please let her know.

## 2024-06-30 NOTE — Telephone Encounter (Signed)
 Spoke with Dr. Rosalynn. She is going to call patient this afternoon.   Tracey JAYSON English, RN

## 2024-06-30 NOTE — Telephone Encounter (Unsigned)
 Phone call to patient NSU at [facility] has offered exploratory surgery but she wouldlike second opinion

## 2024-07-05 ENCOUNTER — Telehealth: Payer: Self-pay | Admitting: *Deleted

## 2024-07-05 NOTE — Telephone Encounter (Signed)
 Tracey Hall called complaining of increased fluid retention and her head hurts. She would like Dr Cornelio to call her.

## 2024-07-06 ENCOUNTER — Other Ambulatory Visit (HOSPITAL_COMMUNITY): Payer: Self-pay

## 2024-07-06 ENCOUNTER — Other Ambulatory Visit: Payer: Self-pay

## 2024-07-07 ENCOUNTER — Other Ambulatory Visit: Payer: Self-pay

## 2024-07-11 ENCOUNTER — Telehealth: Payer: Self-pay

## 2024-07-11 NOTE — Telephone Encounter (Signed)
 Patient calls nurse line requesting an apt.   She reports her left hand and left arm are covered in blisters. She reports she was told long ago by a dermatologist that she has a severe case of eczema. She denies any recent trauma to the area.   She reports the pain is unbearable and reports she is unable to use her left arm properly.   She reports she has been using Silvadene  with minimal relief.   She is requesting an apt to be seen by any provider.   Patient scheduled for 01/23 for evaluation.   Precautions discussed.

## 2024-07-14 ENCOUNTER — Encounter: Payer: Self-pay | Admitting: Family Medicine

## 2024-07-14 ENCOUNTER — Encounter: Payer: Self-pay | Admitting: Physical Medicine and Rehabilitation

## 2024-07-14 ENCOUNTER — Ambulatory Visit: Payer: Self-pay

## 2024-07-15 ENCOUNTER — Encounter: Payer: Self-pay | Admitting: Family Medicine

## 2024-07-17 ENCOUNTER — Other Ambulatory Visit (HOSPITAL_COMMUNITY): Payer: Self-pay

## 2024-07-19 ENCOUNTER — Other Ambulatory Visit: Payer: Self-pay | Admitting: Physical Medicine and Rehabilitation

## 2024-07-19 ENCOUNTER — Other Ambulatory Visit (HOSPITAL_COMMUNITY): Payer: Self-pay

## 2024-07-26 ENCOUNTER — Other Ambulatory Visit: Payer: Self-pay

## 2024-07-27 ENCOUNTER — Other Ambulatory Visit (HOSPITAL_BASED_OUTPATIENT_CLINIC_OR_DEPARTMENT_OTHER): Payer: Self-pay

## 2024-07-28 ENCOUNTER — Other Ambulatory Visit: Payer: Self-pay

## 2024-07-28 MED ORDER — SILVER SULFADIAZINE 1 % EX CREA: TOPICAL_CREAM | CUTANEOUS | 1 refills | Status: AC

## 2024-07-31 ENCOUNTER — Ambulatory Visit

## 2024-08-07 ENCOUNTER — Encounter: Admitting: Physical Medicine and Rehabilitation

## 2024-10-16 ENCOUNTER — Ambulatory Visit: Admitting: Adult Health
